# Patient Record
Sex: Female | Born: 1970
Health system: Southern US, Community
[De-identification: ages and names within clinical notes are randomized; demographics above are authoritative.]

## PROBLEM LIST (undated history)

## (undated) DIAGNOSIS — F419 Anxiety disorder, unspecified: Secondary | ICD-10-CM

## (undated) DIAGNOSIS — J309 Allergic rhinitis, unspecified: Secondary | ICD-10-CM

## (undated) DIAGNOSIS — T300 Burn of unspecified body region, unspecified degree: Secondary | ICD-10-CM

## (undated) DIAGNOSIS — Z923 Personal history of irradiation: Secondary | ICD-10-CM

## (undated) DIAGNOSIS — C801 Malignant (primary) neoplasm, unspecified: Secondary | ICD-10-CM

## (undated) DIAGNOSIS — I509 Heart failure, unspecified: Secondary | ICD-10-CM

## (undated) DIAGNOSIS — D649 Anemia, unspecified: Secondary | ICD-10-CM

## (undated) DIAGNOSIS — R519 Headache, unspecified: Secondary | ICD-10-CM

## (undated) DIAGNOSIS — C50919 Malignant neoplasm of unspecified site of unspecified female breast: Secondary | ICD-10-CM

## (undated) DIAGNOSIS — I10 Essential (primary) hypertension: Secondary | ICD-10-CM

## (undated) HISTORY — DX: Burn of unspecified body region, unspecified degree: T30.0

## (undated) HISTORY — DX: Anemia, unspecified: D64.9

## (undated) HISTORY — DX: Headache, unspecified: R51.9

## (undated) HISTORY — DX: Anxiety disorder, unspecified: F41.9

## (undated) HISTORY — DX: Malignant (primary) neoplasm, unspecified: C80.1

## (undated) HISTORY — DX: Malignant neoplasm of unspecified site of unspecified female breast: C50.919

## (undated) HISTORY — DX: Allergic rhinitis, unspecified: J30.9

## (undated) HISTORY — PX: TUBAL LIGATION: SHX77

## (undated) MED ORDER — POTASSIUM CHLORIDE ER 10 MEQ OR TBCR
30.0000 meq | EXTENDED_RELEASE_TABLET | Freq: Every day | ORAL | 1 refills | Status: AC
Start: 2023-02-01 — End: ?

## (undated) NOTE — Progress Notes (Signed)
 Formatting of this note is different from the original.  Reason for consultation:  Scott Cutting, PA-C posed the following question:  What are your expectations of this request?   Lesion noted.  Location: right medial thigh.    First noticed: present for awhile  Changes of symptoms since first noted: abnormal appearing    Clinical question for the dermatologist: Unsure what it is, inflamed area that is scarred? Asymptomatic.Thank you.    Reason for consultation? (E-CONSULT)  Priority: Evaluation of photos (most common)  Routine     Quality of photographs acceptable: Fair    Assessment: R medial thigh - unable to diagnose with photos alone - possible scar but can't assess whether underlying subcutaneous lesion (unable to evaluate subcutaneous lesions with photo alone)    Will place appointment request in dermatology     Would advise patient to follow up with you sooner if rapidly growing, painful, bleeding ,etc    What to do now: I will place an appointment request for the patient to be seen at the nearest dermatology clinic. Please notify the patient.     The request for an appointment has been sent to our department for scheduling. The patient will receive three calls and a letter for scheduling. Since this patient now has an active scheduling request, they can also call [phone] to schedule if they do not want to wait for a call.    Clinicians may check on the status of an active request in the Appointment Desk:  Epic > Scheduling > Appts > Choose patient > Click on ?Active Requests? header at bottom > Double click on Dermatology request      If worsening, significant change in character, please submit new e-consult with updated pictures.    Media Tab    Signed:  Alvah Jewett, MD   Date:  01/17/2024  Time:  12:51 PM    This virtual consult is provided based on my review of the clinical question. I am providing advice only for the area of concern, based on the information provided to me in the clinical  question above and a focused review of the electronic medical record, including photographic images which have inherent limitations compared to in-person examination. The clinical judgment of the clinician physically seeing the patient may supercede recommendations based on a virtual consult or telederm consult. If my recommendations do not seem to support your clinical impression having seen the patient in person, or if the patient does not improve as you would expect, please repeat the e-consult and request a face to face appointment and we would be happy to evaluate the patient in person.      Electronically signed by Alvah Jewett, MD at 01/17/2024  1:24 PM PDT

## (undated) NOTE — Telephone Encounter (Signed)
 Pending Prescriptions: Disp Refills   venlafaxine  (EFFEXOR  XR) 75 mg extended re*180 ca*0   Sig: Take 2 capsules (150 mg) by mouth every morning    -------------------------------------------------------------------    Electronically signed by Bartlett Walterine Other, RPh at 04/08/2024 11:19 AM PDT

## (undated) NOTE — Assessment & Plan Note (Signed)
 Associated Problem(s): Chemotherapy-induced neuropathy  Formatting of this note might be different from the original.  Has symptoms in feet, can lose balance. Not on meds   Electronically signed by Iva Abernethy, MD at 09/24/2024  3:36 PM PST
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
## (undated) NOTE — Telephone Encounter (Signed)
Signed Prescriptions: Disp Refills   venlafaxine (EFFEXOR XR) 75 mg extended re*90 cap*3   Sig: Take 1 capsule (75 mg) by mouth every morning  Authorizing Provider: Daine Floras    -------------------------------------------------------------------    Electronically signed by Daine Floras, MD at 04/27/2023  6:46 PM PDT

## (undated) NOTE — Addendum Note (Signed)
 Addended by: Daine Floras on: 07/29/2023 09:01 AM     Modules accepted: Orders      Electronically signed by Daine Floras, MD at 07/29/2023  9:01 AM PST

## (undated) NOTE — Progress Notes (Signed)
Formatting of this note is different from the original.  Provider needs to reconcile: none    Patient's main concern: Foot Problem (Bilateral foor pain, rigth foot hurts more and pt thinks there may be a facture)     Other needs or issues hoping to address?   Lower back pain    Care reminders were reviewed:  Yes:  pt would like to discuss covid booster with provider    Chaperone for Sensitive Physical Exams (In Office):  Not Applicable    No orders of the defined types were placed in this encounter.    eCheck-In Action/Status       eCheck-in Steps Patient Action Taken Status    Personal Information Completed Completed    Insurance  Filtered    Payments Completed Completed    ESign Documents  Not Offered    Questionnaires Completed Completed    Medications Verified Completed    Allergies Verified Completed    Barcode  Completed         Assigned Appointment Questionnaires and Status       Assigned Questionnaires Status    REASON FOR VISIT SELECTION [facility]) Completed    PAPERLESS EOB OPT IN Completed           Medications reviewed:       Tue Feb 15, 2023  8:25 AM       Allergies reviewed:         Feb 15, 2023  8:24 AM       Questionnaires completed: Review questionnaire responses  Smoking Tobacco Use: Never    Electronically signed by Benna Dunks at 02/15/2023 12:27 PM PDT

## (undated) NOTE — Telephone Encounter (Signed)
 Formatting of this note might be different from the original.  LVM informing pt of below pre-op guidelines prior to procedure today. Given direct line to call for questions.    - please confirm that you are not actively menstruating, spotting is OK.    - please go to lab 30-45 minutes prior to your scheduled check in to drop of urine sample for pregnancy testing. This does not need to be completed if you are menopausal.    - please take 440mg  of naproxen (Aleve) 1 hour prior to your appointment. If you are unable to take Aleve, you can take 1000mg  of extra strength acetaminophen  (Tylenol ) OR 800mg  of ibuprofen  (Advil )    Sydelle Kanaris, RN 8:49 AM    Electronically signed by Kanaris Sydelle Greaser, RN at 08/08/2024  8:49 AM PST

## (undated) NOTE — Progress Notes (Signed)
 Formatting of this note might be different from the original.  Anesthesia E-Consult    Patient Name: Yolanda Bowers  Medical Record Number: [id number]   Date of Birth: 1971-02-19  Today's Date: Jan 05, 2024  Primary Care Provider: Lajean Pike, MD    Referring Provider: Herlene Lone    Reason for Referral: 26yo female referred for her first colonoscopy with known hx of high-grade infiltrating ductal carcinoma of the left breast (ER+/PR+, FISH +) with angiolymphatic invasion, history of neoadjuvant chemotherapy with ddAC-THP, left mastectomy with lymph node biopsy, received 4 or 6 cycles of herceptin  and Perjeta  due to drop in EF, on letrozole , history of PVC ablation on 03/21/2020. According to patient report when she had mastectomy with breast reconstruction surgery, she had hard time waking up from the sedation itself. Warrants further anesthesiology consult to hash out her risks and regimen for anesthesia.     Review summary: I have converted this E-consult to a Virtual Chart for better follow up and to include the Pre-op RNs.    I reviewed the chart but am unable to see any anesthesia records.  EF drop was from 61% to 59% per [facility] records.  Fortunately sedation/anesthesia for colonoscopy should be much less than for her Mastectomy and reconstruction.  I anticipate we can do moderate sedation and avoid general anesthesia for her procedure.  As such I do not see any contraindications to proceeding with her colonoscopy.    RESPONSE TO ANESTHESIA CLINIC/ ANESTHESIA CONSULT  OK TO SCHEDULE FOR SURGERY.    Assessment includes consideration of existing admission criteria for [facility] and, if indicated, appropriate [facility] inpatient facility.  Anticipated anesthetic and perioperative care needs will not exceed the capability of designated facility.    Signature: Eva Hikes, MD  Dept: Anesthesiology  Date: Jan 05, 2024    The above evaluation was performed solely through review of existing electronic medical  records (EPIC) and without in-person examination of the patient in question.  Assessment and recommendations are therefore based only on available information, current medical literature, and risk-reduction strategies to optimize perioperative outcome.  If patient has acute changes in health status, the above recommendation may no longer apply to the clinical scenario and further evaluation may be needed prior to scheduled procedure.    Electronically signed by Eva Hikes, MD at 01/05/2024  2:55 PM PDT
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
## (undated) NOTE — Progress Notes (Signed)
Formatting of this note is different from the original.  Provider needs to reconcile: Allergies     Patient's main concern: Med review    Other needs or issues hoping to address?       Care reminders were reviewed:  No: E checkin    Chaperone for Sensitive Physical Exams (In Office):    No orders of the defined types were placed in this encounter.    eCheck-In Action/Status       eCheck-in Steps Patient Action Taken Status    Personal Information  Not Needed    Insurance  Filtered    Payments Skipped Not Needed    ESign Documents  Not Offered    Questionnaires  Not Needed    Medications Verified Completed    Allergies Updated Completed    Barcode  Completed         Assigned Appointment Questionnaires and Status       Assigned Questionnaires Status    REASON FOR VISIT SELECTION [facility]) Completed           Medications reviewed:       Tue Feb 15, 2023  8:25 AM       Allergies reviewed:         Feb 15, 2023  8:24 AM       Questionnaires completed: No questionnaires completed  Smoking Tobacco Use: Never  Electronically signed by Rosilyn Mings, MA at 04/18/2023 12:28 PM PDT

## (undated) NOTE — Progress Notes (Signed)
 Formatting of this note is different from the original.  Chief Complaint   Patient presents with    Procedure     Colposcopy      New concerns or changes since pre-visit: No    Chaperone for Sensitive Physical Exams (In Office):  Not Applicable        Electronically signed by Ronal Nest, MA at 08/08/2024 11:54 AM PST

## (undated) NOTE — Progress Notes (Signed)
 Formatting of this note is different from the original.  Images from the original note were not included.  Diagnosis and Treatment Plan   (F33.9) Major depressive disorder, recurrent episode with anxious distress  (primary encounter diagnosis)  Comment: PHQ 9 of 10  Plan: 45 MIN INDIVIDUAL PSYCHOTHERAPY     (F43.9) Stress  Comment: divorce, health problems  Plan: 45 MIN INDIVIDUAL PSYCHOTHERAPY     Patient Instructions   -take decisions one step at a time, focus on the next indicated thing! It is not possible for anyone to make so many decisions at once, it is OK to slow it down and break it down!    -continue to practice radical acceptance of the things that are unchangeable     Treatment Goal/Plan     Collaborative therapy goal (what we can work on together): Mange and process stressors, make decisions around life events and relationship, learn coping skills, manage depressive symptoms, manage anxiety level, be honest with myself and see if I want to keep doing this job    What strengths or strategies I can use to support my goal (self-care, skills, resources): Creativity, relationship is supportive    Treatment modality: Cognitive Behavior Therapy (CBT)    What will be different when I achieve my therapy goal: I will know if I am ready for a career change or if I am ready to move to another country, discover possibilities    Last updated by Agustina Caroli, [facility] on 05/26/2023        Treatment Progress: 4 - Partial response: Patient is actively working toward treatment goal(s) with positive trends in data or functioning        Follow up: 3. Based on clinical need and assessment of risk, recommend member return within 3 weeks timeframe.    Interval History     Chief Complaint   Patient presents with    Depression    Stress     Therapeutic Alliance Discussion: Review Therapeutic Alliance responses and discuss feedback with patient.      Suicidal Ideation: Denies suicidal ideation.    Session Summary:     Yolanda Bowers  shares that the information and discussion around radical acceptance was really helpful. Yolanda Bowers and MLT review the difference between acceptance and approval, and how we only use the acceptance with the things that are not changeable.    Yolanda Bowers shares the acceptance has led her to think she may sell the house. Yolanda Bowers shares she has so many decisions she is weighing, she does not feel direct pressure form partner but it is in the back of her mind as his visa limits how often he can visit. MLT and Lavone discuss taking off some of this burden and remembering the visa travel limits are not Yolanda Bowers's fault, and that it is OK to take decisions slowly and put son first.    Yolanda Bowers and MLT discussed taking things one step at a time and focusing on the next indicated thing. Much like getting treatment for an illness, we only focus on the next indicated steps instead of 25 steps ahead, and the same goes for all of the decisions Antonetta has to make, MLT reminds Yolanda Bowers to focus on one at a time and go from there.     The treatment modality of Cognitive Behavior Therapy (CBT) Informed the treatment strategy: Psychoeducation, Behavior Modification, Problem-Solving Therapy, Cognitive Coping, Validation, and Supportive listening       No data to display  10/26/2023     2:47 PM 07/31/2023     9:54 PM   BHI Monitor Detail   Total Score (MyGH) 16 8   Total Score 16  8    PHQ-2 Score 4  1    Anhedonia 3  1   Sadness 1  0   Sleep 3  2   Energy 3  2   Appetite 3  1   Failure 0  0   Concentration 3  1   Slowed or Restless 0  1   SUICIDE 0  0   PHQ9 Total 16  8    Anhedonia (MyGH) Nearly every day Several days   Sadness [facility]) Several days Not at all   Sleep [facility]) Nearly every day More than half the days   Energy [facility]) Nearly every day More than half the days   Appetite [facility]) Nearly every day Several days   Failure [facility]) Not at all Not at all   Concentration [facility] Nearly every day Several days   Slowed or Restless [facility]) Not at all Several days    SUICIDE [facility]) Not at all Not at all   GAD-2 Feeling Nervous  1   GAD-2 Unable to stop worrying  0   GAD-2 Total  1    Functioning  1   Frequency of drinking 1  2   # of drinks per day 0  0   6 or more drinks on one occasion 0  0   Audit-C Total 1  2    Marijuana use 0  1   Illegal drug use 0  0   Access to guns? 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

## (undated) NOTE — Progress Notes (Signed)
Formatting of this note is different from the original.  Office Visit    Subjective:   Patient ID: Yolanda Bowers is a 21 year old female     Reason for Visit Questionnaire   Primary Reason for Visit: Other   Symptom Description Dr requested an appt to review my medications.    Prior History of This Concern Doesn't Apply   Symptom Duration     Symptom Frequency     Symptom Getting Better or Worse     Treatments Patient Has Tried     Possible Symptom Cause     Symptom Worries     Other Medical Concerns Yes   Other Medical Concerns- Details I am considering stopping venlafaxine and I want advice regarding how to do this and if should.;So I need a new referral for massage therapy?     Review questionnaire responses  Medications, Family and Social History per Epic  Patient provided verbal consent to be recorded using documentation assistance tool.   BP 122/71   Pulse 56   Wt 170 lb (77.1 kg)   SpO2 100%   BMI 27.44 kg/m     Physical Exam  Constitutional:       Appearance: Normal appearance.   HENT:      Head: Normocephalic and atraumatic.   Cardiovascular:      Rate and Rhythm: Normal rate and regular rhythm.   Pulmonary:      Effort: Pulmonary effort is normal.      Breath sounds: Normal breath sounds.   Neurological:      Mental Status: Yolanda Bowers is alert.     No axillary lymphadenopathy or edema on left side axilla.   History of Present Illness  Yolanda Bowers is a 47 year old female presents for a medication review. She has been on Effexor (venlafaxine) 75mg , Valsartan, Metoprolol, Femira, Aspirin, and Zyrtec. She also takes Metocarbamol as needed for muscle spasms.    Yolanda Bowers has been contemplating the effects of venlafaxine, expressing feelings of  feeling numb , which she has noticed over the past year. Despite these concerns, she acknowledges the medication's effectiveness in managing her anxiety and reducing the frequency of hot flashes. She is going through divorce and it has been stressful.    In addition to her current  medication regimen, Yolanda Bowers is dealing with multiple health issues. She has a history of breast cancer and has recently been diagnosed with a brain cyst, which was discovered during an MRI scan conducted due to symptoms of nausea, dizziness, and falls. Has an appointment with neurology this week.     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 This was discovered during a series of genetic tests conducted by her cardiologist to determine the cause of her heart failure.    Lastly, Yolanda Bowers reports swelling in the axilla and underarm on left side , which was noticed during a recent massage session. An ultrasound has been ordered to further investigate this issue.    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 She is currently going through a divorce and is unsure if this is the right time to change her medication. Will continue with venlafaxine.     Hypertension  Well controlled with valsartan and metoprolol.  -Continue valsartan and metoprolol as prescribed.    Continue cetrizine for allergies     pineal cyst:    Recent MRI showed a pineal gland cyst is stable from 9/23. However no mention of pineal cyst on MRI 9/23.  Has an appointment to follow up with a neurologist.  -Continue current medications and follow up with neurologist.       CPT2 deficiency  Assessment & Plan:  Asymptomatic. Is a carrier. Plans to have her kids tested sometime .     Major depressive disorder, recurrent episode with anxious distress  Comments:  in remission on effexor    General Health Maintenance  -Plan for mammogram in December.  -Plan for COVID vaccine after September.      Electronically signed by Daine Floras, MD at 04/18/2023 12:28 PM PDT
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
## (undated) NOTE — Telephone Encounter (Signed)
 Signed Prescriptions: Disp Refills   venlafaxine  (EFFEXOR  XR) 75 mg extended re*180 ca*0   Sig: Take 2 capsules (150 mg) by mouth every morning  Authorizing Provider: IVA ABERNETHY    -------------------------------------------------------------------    Electronically signed by IVA ABERNETHY, MD at 04/09/2024  9:09 PM PDT

## (undated) NOTE — Progress Notes (Signed)
 Formatting of this note is different from the original.  Caralyn Twining Treanor is a 20 year old female who presents for evaluation of abnormal Pap smear(s).  Consultation requested by Vankireddy, MD .       Recent pap smear showed: Negative (NILM, negative for intraepithelial lesion or malignancy), HPV positive non 16/18 , and HPV positive 16   The prior pap showed: Negative (NILM, negative for intraepithelial lesion or malignancy)  Prior cervical/vaginal/vulvar disease: CIN 1  Prior cervical treatment: no treatment    HPV vaccine: Unvaccinated. Counseling provided for vaccine. Patient not eligible for vaccine..     Patient's last menstrual period was 12/22/2018 (approximate).  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    Carcinoma of left breast, estrogen and progesterone receptor positive    Chemotherapy induced cardiomyopathy    Macromastia    Malignant neoplasm of upper-outer quadrant of left breast in female, estrogen receptor positive    PVC's (premature ventricular contractions)    Major depressive disorder, recurrent episode with anxious distress    Stress    Divorce proceedings pending    CPT2 deficiency    Colon cancer screening    Chemotherapy-induced neuropathy    Cervical high risk HPV (human papillomavirus) test positive     Allergies as of 08/08/2024 - Reviewed 06/27/2024   Allergen Reaction Noted    Codeine Nausea and Vomit 05/10/2007    Lisinopril  Cough 05/14/2020    Paclitaxel  Hives 05/04/2019    Walnut Rash 11/25/2019     Patient Active Problem List     Tobacco Use   Smoking Status Never   Smokeless Tobacco Never     Discussed abnormal pap smears and the need for colposcopy and possible biopsy to diagnose.  Procedure for colposcopy and possible biopsy has been explained to  the   patient and consent obtained.    PROCEDURE:  Speculum placed in vagina with excellent visualization of cervix.  5% acetic acid applied to cervix and vagina.    FINDINGS:    Pelvic: Bimanual and vaginal exams reveal: external genitalia normal, vaginal mucosa normal, cervix clean, cervix and vagina atrophy noted    Lymphatic: Inguinal and femoral adenopathy:Not examined    Cervix: Inadequate exam (unable to visualize entire SCJ)  no visible lesions  no mosaicism  no punctation  no abnormal vasculature    Procedure: cervix swabbed with Lugol's solution, endocervical curettage performed; 4 quadrant biopsies done for HPV 16 positive    Vaginal inspection: vaginal colposcopy not performed    Vulvar colposcopy: vulvar colposcopy not performed    Procedure Summary: patient tolerated procedure well, colposcopy inadequate    ASSESSMENT: Normal exam without visible pathology    PLAN:Specimens labelled and sent to Pathology and Will base further treatment on Pathology findings    Nathanel JINNY Ned, MD      Electronically signed by Ned Nathanel JINNY, MD at 08/08/2024 11:54 AM PST

## (undated) NOTE — Discharge Summary (Signed)
 Formatting of this note is different from the original.  PERIOPERATIVE DISCHARGE SUMMARY    Admitted: 06/13/2024  8:07 AM  Reason: Periprocedural  Preop Dx: Pre-Op Diagnosis Codes:      * Colon cancer screening [Z12.11]   Procedure: COLONOSCOPY  Case findings and description per operative report.     Discharged: 06/13/24  DC Dx: Post-Op Diagnosis Codes:     * Colon cancer screening [Z12.11]  Course: patient tolerated the procedure well.  Consults: None  Dispo: Home  Condition: Good  Orders: Per perioperative orderset and PACU criteria.   Instructions: Educational and perioprocedural instructions per AVS.  Future Appointments   Date Time Provider Department Center   06/20/2024  4:15 PM Post, Powell Ee, [facility] [place]   06/27/2024  3:40 PM Iva Abernethy, MD [facility] [place]       Medication List       CONTINUE taking these medications      acetaminophen  500 mg tablet  Commonly known as: TYLENOL     aspirin 81 mg delayed release tablet    cholecalciferol (vit D3) 50 mcg (2,000 unit) Cap    letrozole  2.5 mg tablet  Commonly known as: FEMARA   Take 1 tablet (2.5 mg) by mouth daily    metoprolol  succinate 25 mg extended release (24 hr) tablet  Commonly known as: TOPROL  XL  Take 1 tablet (25 mg) by mouth 2 times daily    polyethylene glycol 17 gram/dose oral powder  Commonly known as: MIRALAX   Dissolve 17 g into specified amount of water & drink by mouth daily    valsartan  40 mg tablet  Commonly known as: DIOVAN   Take 1 tablet (40 mg) by mouth 2 times daily    venlafaxine  75 mg extended release (24 hr) capsule  Commonly known as: EFFEXOR  XR  Take 2 capsules (150 mg) by mouth every morning    ZYRTEC  ORAL          STOP taking these medications      lisinopriL  2.5 mg tablet  Commonly known as: ZESTRIL           ASK your doctor about these medications      cyanocobalamin 500 mcg tablet  Commonly known as: VITAMIN B12          Provider: Gattis Pen, MD  Date: June 13, 2024  Time: 10:01  AM    Electronically signed by Pen Gattis, MD at 06/13/2024 10:02 AM PDT

## (undated) NOTE — Progress Notes (Signed)
Formatting of this note is different from the original.  Provider needs to reconcile: None    Patient's main concern: Well Exam and Medication review     Other needs or issues hoping to address?   Brain cyst     Care reminders were reviewed:  Yes: Patient Instructions provided    Chaperone for Sensitive Physical Exams (In Office):  Not Applicable    No orders of the defined types were placed in this encounter.    eCheck-In Action/Status       eCheck-in Steps Patient Action Taken Status    Personal Information  Not Needed    Insurance  Filtered    Payments Skipped Not Needed    ESign Documents  Not Offered    Questionnaires Completed Completed    Medications Verified Completed    Allergies Updated Completed    Barcode  Completed         Assigned Appointment Questionnaires and Status       Assigned Questionnaires Status    REASON FOR VISIT SELECTION (MYCHART) Completed    BHI MONITORING TOOL (MYCHART) Completed    WELL-CARE QUESTIONNAIRE 18-65+ Completed    PAPERLESS EOB OPT IN Completed           Medications reviewed:       Mon Apr 18, 2023 10:51 AM       Allergies reviewed:         Apr 18, 2023 10:51 AM       Questionnaires completed: Review questionnaire responses  Smoking Tobacco Use: Never  Electronically signed by Epifania Gore, MA at 04/18/2023 12:28 PM PDT
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
## (undated) NOTE — Progress Notes (Signed)
 Formatting of this note is different from the original.  Images from the original note were not included.  PHYSICAL THERAPY LUMBAR EVALUATION    Reason for Referral: (M54.50) Low back pain without sciatica, unspecified back pain laterality, unspecified chronicity  (primary encounter diagnosis)      Date of Onset:  summer 2025  Referring Practitioner: Iva Abernethy, MD   Recommended Treatment: Eval and Treat   Precautions/Contraindications: no  Occupation: [employer]     Visit: 1    Reason for Visit Questionnaire   Symptom Description My lower back has become more sore since the summer and becomes stiff and a hy when i sit for more than 20 min, walk for more than 30 min and work on [employer] on the wheel.   Symptom Duration For 2 - 6 months   Symptom Frequency Always   Symptom Getting Better or Worse Worsening   Symptom Triggers Exercise classes make it worse and doong yardwork and pottery work is more difficult.   Symptom Worries I have osteopenia in my spine and my last spine scan showed it has become worse. 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   Treatments Patient Has Tried Stretching helps a little, warm baths with epsom salts so etimes helps the pain, i take frequent breaks when working on the wheel and that helps some.  Sleeping on my side helps some.   External Medical Records No    Recent UC, ER or admission No    Forms/Paperwork No          SUBJECTIVE     Patient Reports 06/07/2024:  Back has been sore for a long time. Got worse over the summer. Pain in the mid section to low back. Wake up sore, walk and I get sore, sitting I get sore. Haven't been able to exercise because it hurts my back. Have osteopenia. Do pottery. 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 Even if I  stretch it hurts after an hour. Would love to be able to walk more than 30  min. Back gets sore. Have put on some weight this year. Would love to be able to do weight training classes. Can't take ibuprofen . Use dragon balm sometimes. If I sit for awhile, I am stiff getting up. Affects my hips too. Pain does wake me up. I find myself slouching a lot. Slouching hurts though. Can't pick up heavy objects. Can't do more than 25 lbs. Wedging clay is hard. 20-30 min for anything is a lot. I pop a lot, clunk.     Functional Limitations/Worse With: prolonged position or activity     OBJECTIVE   Imaging: x-ray lumbar  Impression:   Trace right convex lumbar curvature.  Mild left L3-L4 disc height   loss.  No change compared to 2024.  No fracture.     Signed by: Harlene Prude   Date: 04/20/2024 3:26 PM     Standardized Care Tool Used      06/06/2024     5:28 PM   PT/OT Patient Specific Functional Scale   Activity 1: Sleeping comfortably without pain   Activity 1 Score: 5   Activity 2: Working on the [employer] wheel   Activity 2 Score: 5   Activity 3: Yoga   Activity 3 Score: 5   Activity 4:  Sitting for more than 20 minutes   Activity 4 Score: 5   Activity 5: Walking for more than 30 minutes   Activity 5 Score: 5   Mean: 5       Patient-reported      06/07/2024   Gait Patient shows no significant  gait deviations   Posture Iliac crests level  Lumbar lordosis slightly increased       Lumbar range of motion Standing:   06/07/2024   Flexion Fingertips to toes, P    Extension 15  P   Side-bend Right Fingertips to knee   Side-bend Left Fingertips to knee   Rotation Right Within normal limits    Rotation Left  P in middle back      Manual Muscle Test   06/07/2024    Hip Flexion Right: 4-/5  Left: 4-/5    Hip Extension Right: 4P/5  Left: 4P/5    Hip Abduction Right: 4/5 P  Left:     Hip ER Right: 4+/5  Left: 4+/5    Knee Extension Right: 5/5  Left: 5/5      Special Tests   06/07/2024   Scour Right: -  Left: -   FABER Right: +  Left: -   Lumbar Joint Mobility L3&L5, left side L3     P = painful  TREATMENT    TREATMENT:   Therapeutic Exercises: below, bolded = completed 1 set of strengthening or 1 rep of stretching in the clinic, unless stated otherwise    Patient handout provided: Yes  Access Code: [email or web address]  URL: [email or web address]  Date: 06/07/2024  Prepared by: Deitra Dada    Exercises  - Hooklying Isometric Hip Flexion  - 3-5 x weekly - 1-2 sets - 10 reps - 5-10 hold  - Supine Transversus Abdominis Bracing - Hands on Stomach  - 3-5 x weekly - 1-2 sets - 10 reps - 5 hold  - Supine Bridge  - 3-5 x weekly - 1-3 sets - 10 reps  - Clamshell  - 3-5 x weekly - 1-3 sets - 10 reps    Treatment Time:   Physical Therapy Evaluation 20 minutes  Therapeutic Exercise 20 minutes  Total Time 40 minutes    ICD10: (M54.50) Low back pain without sciatica, unspecified back pain laterality, unspecified chronicity  (primary encounter diagnosis)       Initial Visit with PT: 06/07/2024    ASSESSMENT   Marifer L Savannah is a 2 year old who presents with low back pain. Patient demonstrates good mobility however decreased core and hip strength affecting activities of daily living, sleep, work, recreational activities.    The patient will benefit from skilled physical therapy to address the above stated problems.     Evaluation complexity is LOW based on the following factors:    Clinical presentation is stable  Body System (body structures/functions, activity limitations and/or participation restrictions;  addressing 1-2 elements   Number of personal factors addressed:  no personal factors and/or comorbidities.    Barriers to Recovery:  None    GOALS     Short Term Goals  Patient will increase sitting tolerance to 40 minutes or greater to perform seated work requirements.  Patient will resume prior level of sleeping pattern undisturbed by back pain.  Time Frame: 07/19/2024    Long Term Goals  Patient will increase tolerance to sitting > 1 hour to perform seated work requirements.  Patient able to continuously walk 60 minutes or  greater without limitations from  back pain to be able to participate in community activities.  Patient able to stand for > 45 minutes to perform work duties/requirements.  Patient will have 2 point or greater increase in total PSFS score demonstrating decreased impact of their condition on their daily life.  Patient will be independent with home exercise program.   Time Frame: 09/07/2024    Prognosis to achieve goals: Good    PLAN   Next visit treatment recommendations:  progress core and hip strengthening as tolerated    Treatment Plan with Rationale:  The patient is to check back every 1-3 weeks, for 12 week(s) starting 06/07/2024 to progress toward short and long term goals.   Range of Motion Exercises  to increase functional mobility  Safe Stretching of the Joint  to increase functional mobility  Strengthening as tolerated to increase functional mobility  Joint Mobilizations  to decrease pain and/or increase ROM   Soft Tissue Mobilization as Therapeutically Necessary to decrease pain and/or increase mobility  Closed Chain Strengthening  as tolerated to increase functional mobility    Discharge of services when long term goals are met, patient plateaus in progress or doesn't return for follow up visit in greater than 4-6 weeks.      Deitra Earnie Dada, PT     Electronically signed by Dada Deitra Earnie, PT at 06/07/2024  4:58 PM PDT

## (undated) NOTE — Telephone Encounter (Signed)
Formatting of this note might be different from the original.  Images from the original note were not included.  Insurance Confirmed: PPO    Spoke With: Patient    Patient asking for: Social Work     Scheduled with: SW - Patient states they are Physically SAFE and no HI in last two weeks - 40 min FOLLOW UP: 1220 - Video Visit    Additional Info:  N/A    MH Process Needed? No    Additional Call Information       Electronically signed by Vern Claude, Asst at 04/12/2023  9:11 AM PDT
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
## (undated) NOTE — Progress Notes (Signed)
 Formatting of this note is different from the original.  Phone Visit    Subjective:   Patient ID: Yolanda Bowers is a 51 year old female           Medications, Family and Social History per Epic    Patient provided verbal consent to be recorded using documentation assistance tool.     Drenching sweats every night from october. Having to use towel in the middle of the night.tried limting caffeine and drink more water. Sometimes she does not even wear clothes and open the window and still has  sweats. She had it with chemotherapy.   On femara from 2021. Chemo for breast cancer was May 2020 and completed it in October. Having venlafaxine from  2021  Last menses was may 2020   [place] cellulitis 10/18 and got an ultrasound 10/23 with no LYMPHADENOPATHY   Has a lump on forearm (cannot see but she can feel it like a size of a quarter)and it has not resolved., but no redness or rash or itching like how she had when she went to ER   No unexplained fever or unexplained weight loss.   Has cough since chemo  Feet hurt  Oncologist in the past recommended gabapentin. But she decided to not to take it. So they tried venlafaxine and that helped some hot flashes. Has some hot flashes during the day but night sweats are new. She had occasional hot flashes at night but nothing like drenching sweats.     Assessment and Plan:       1. Night sweats  -     QUANTIFERON - TB GOLD (QUAL ONLY)  -     URINALYSIS WITH REFLEX TO MICROSCOPIC AND CULTURE ([facility])  -     COMP. METABOLIC PANEL  -     CRP (C-REACTIVE PROTEIN FOR INFLAMMATION) (G  -     HEPATITIS C SCREENING (REFLEX) ([facility])  -     HIV SCREENING TEST W/REFLEX  -     TSH (REFLEXIVE)  -     CBC/PLT/DIFF W/REFLEX TO MANUAL DIFF IF INDI  -     X-RAY CHEST 2V (STD) PA+LAT  -     CULTURE, BLOOD-ROUTINE [facility])    Likely her symptoms are related to letrozole.  However because of new onset of drenching night sweats further evaluation is necessary to rule out other causes.  Encouraged  her to do mammogram, mail the fit kit back, and fill the behavioral health questionnaire.  Follow up in 3 weeks if symptoms persist. If above workup is abnormal/normal  I recommend doing CT chest abdomen and pelvis..        This phone visit consisted of 19 minutes of interaction with the patient.  Provider was located at [facility] and the patient was located at [facility] in [facility] for this visit.   Electronically signed by Daine Floras, MD at 07/29/2023  9:04 AM PST

## (undated) NOTE — Progress Notes (Signed)
Formatting of this note is different from the original.   Reason for visit: follow up    All medications verified and any discrepancies noted.     Outside medications patient has no outside meds.      Patient's pharmacy benefit was verified and preferred pharmacy was selected.      Patient has been informed of the following care gaps: Yes    - A pap test to check for cervical cancer.     - A flu shot to protect against the flu virus.        Electronically signed by Audrea Muscat, LPN at 91/47/8295  2:53 PM PST

## (undated) NOTE — H&P (Signed)
 Formatting of this note is different from the original.  History and Physical Exam:     Indications: Yolanda Bowers is 58 year old and presents for  Procedure(s):  COLONOSCOPY for Pre-Op Diagnosis Codes:      * Colon cancer screening [Z12.11].    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    Carcinoma of left breast, estrogen and progesterone receptor positive    Chemotherapy induced cardiomyopathy    Macromastia    Malignant neoplasm of upper-outer quadrant of left breast in female, estrogen receptor positive    PVC's (premature ventricular contractions)    Major depressive disorder, recurrent episode with anxious distress    Stress    Divorce proceedings pending    CPT2 deficiency    Colon cancer screening    Chemotherapy-induced neuropathy     Current Outpatient Medications   Medication Instructions    acetaminophen  (TYLENOL ) 500-1,000 mg,  AS NEEDED    aspirin 81 mg, Daily    cetirizine  HCl (ZYRTEC  ORAL) Take by mouth    cholecalciferol (vit D3) 5,000 units, Daily    cyanocobalamin (VITAMIN B12) 500 mcg, Daily    letrozole  (FEMARA ) 2.5 mg, Oral, Daily    metoprolol  succinate (TOPROL  XL) 25 mg, Oral, 2 Times Daily    polyethylene glycol (MIRALAX ) 17 g, Oral, Daily    valsartan  (DIOVAN ) 40 mg, Oral, 2 Times Daily    venlafaxine  (EFFEXOR  XR) 150 mg, Oral, Every Morning     Allergies   Allergen Reactions    Codeine Nausea and Vomit    Lisinopril  Cough    Paclitaxel  Hives    Walnut Rash       Physical Exam:  General Appearance: Healthy appearing and alert  Lungs: Clear to auscultation  Cardiovascular: Regular rate and rhythm  Abdomen: soft, non-tender    Assessment:  Okay to proceed with planned Procedure(s):  COLONOSCOPY.    Medically stable     Immediate Pre-Procedure Reassessment Prior  to Sedation and Procedure:    Any difficulty with prior anesthesia or sedation No    Risk of airway intubation:  1. The Mallampati score is grade 1: Uvula completely visible  2. The occipital spine is below mentum with neck fully extended  3.  Mouth Opening: The inter-incisor distance is greater than 35 mm  4. The patient has a history of no increased risk of difficult airway intubation    ASA Level: II - Mild or mod systemic disease w/o functional impairment or > 39 years old    Informed Consent: The indications, risks, benefits and alternatives to the procedure were discussed in detail. Risks of the procedure were described as including, but not limited to, bleeding, perforation, heart and lung complications, infection, medication reaction and missed diagnosis. Signed consent has been obtained.    Plan for sedation: Ok to proceed with conscious sedation.    Pertinent laboratory studies in Epic have been reviewed. Patient is ready for sedation and procedure.    Gattis Pen, MD   06/13/2024 9:13 AM      Electronically signed by Pen Gattis, MD at 06/13/2024  9:13 AM PDT

## (undated) NOTE — Progress Notes (Signed)
 Formatting of this note might be different from the original.  Attempted to contact the patient via phone to complete pre-visit prep. SM sent with reminder to complete E-CHECK IN.    Electronically signed by Marshell Levan, [place] at 07/29/2023  8:58 AM PST

## (undated) NOTE — ED Notes (Signed)
Formatting of this note might be different from the original.  2 week hx redness, tenderness,  swelling left arm, worse last 2 days. No known injury. No fevers/chills. Positive distal CMS  Electronically signed by Vic Ripper, RN at 06/10/2023  4:57 PM PDT

## (undated) NOTE — Progress Notes (Signed)
 Formatting of this note is different from the original.  Images from the original note were not included.  Diagnosis and Treatment Plan     Major depressive disorder, recurrent episode with anxious distress   [[id number]]  -  Primary  Stress   [201941]          Patient Instructions:     What is Feedback Informed Care?    Feedback Informed Care is a measurement-based collaborative process that engages you, the patient, in the decisions that impact your care. Feedback Informed Care is an evidence-based practice that focuses on:    Collaboratively setting treatment goals  Symptom monitoring over time  Measuring goodness of fit between you and your clinician    Why Feedback Informed Care?    A number of studies have demonstrated that when your clinician monitors outcomes and feedback, you get better faster. Many Systems analyst, [employer], and the American Psychiatric Association recommend Feedback Informed Care.    Feedback Informed Care has shown to:  As much as DOUBLE the effectiveness of treatment   Decrease treatment dropout rates by half   Reduce hospitalizations and shorten lengths of stay by two thirds   Provides structure and focus for treatment  Puts you at the center of your care - establishing goals, assessing progress, and providing feedback about the goodness of fit     Core Elements of Feedback Informed Care:    Before Your Visit  Complete the pre-visit questionnaire before every visit with your clinician.    During Your Visit  With your clinician, review your questionnaire data (symptoms and goodness of fit), your homework completed between visits, and your progress toward your treatment goal.    After Your Visit  Take action on the steps to reach your treatment goal that you and your clinician agreed on during your session.     If you have any concerns or questions regarding your care, please send me a secure message via your [facility] "MyChart"  account. I look forward to seeing you again next session.      Treatment Goal/Plan     Collaborative therapy goal (what we can work on together): Mange and process stressors, make decisions around life events and relationship, learn coping skills, manage depressive symptoms, manage anxiety level, be honest with myself and see if I want to keep doing this job    What strengths or strategies I can use to support my goal (self-care, skills, resources): Creativity, relationship is supportive    Treatment modality: Cognitive Behavior Therapy (CBT)    What will be different when I achieve my therapy goal: I will know if I am ready for a career change or if I am ready to move to another country, discover possibilities    Last updated by Agustina Caroli, [facility] on 05/26/2023        Treatment Progress: I - Initial visit with this provider. Treatment progress to be assessed next visit        Follow up: Vienne to schedule follow-up in 3 week(s).    Interval History     Chief Complaint   Patient presents with    Depression    Stress     Therapeutic Alliance Discussion: Initial visit. Provide overview of Therapeutic Alliance.     Suicidal Ideation: Denies suicidal ideation.    Session Summary:     Patient and MLT completed treatment plan with goals around managing Stressors around filing for divorce,  ex husband making the divorce contentious and how patient will have to provide ex with half of all she has earned and worked so hard for over the years.     Yolanda Bowers shares she is trying to make some big life decisions and would like someone to problem solve with.    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     The treatment modality of Cognitive Behavior Therapy (CBT) Informed the treatment strategy:  Treatment Planning, Problem-Solving Therapy, Validation, and Supportive listening       No data to display             05/26/2023     5:07 PM 04/18/2023     9:51 AM   BHI Monitor Detail   Total Score [facility])  2   Total Score 11 2    PHQ-2 Score 3 0   Anhedonia 2 0   Sadness 1 0   Sleep 1 1   Energy 3 0   Appetite 0 0   Failure 0 0   Concentration 3 1   Slowed or Restless 1 0   SUICIDE 0 0   PHQ9 Total 11 2   Anhedonia ([facility])  Not at all   Sadness [facility])  Not at all   Sleep [facility])  Several days   Energy [facility])  Not at all   Appetite [facility])  Not at all   Failure [facility])  Not at all   Concentration [facility])  Several days   Slowed or Restless [facility])  Not at all   SUICIDE [facility])  Not at all   GAD-2 Feeling Nervous 1 1   GAD-2 Unable to stop worrying 0 0   GAD-2 Total 1 1   Functioning 1 0   Frequency of drinking 2 2   # of drinks per day 0 0   6 or more drinks on one occasion 0 0   Audit-C Total 2 2   Marijuana use  1   Illegal drug use 1 1   Access to guns? 0 0     MHP[employer] [place]    Mental Status Features   Only Note Clinically Significant changes to Mental Status Features: [facility] Notable Mental Status Features: No Clinically significant changes since last noted    Substance Use Conversation: N/A    Visit Type   Patient was given disclosure statement: N/A    Session attended by: Patient     Duration of Visit: 45 minutes    This visit was completed via Video Visit: Location of patient: Home in [place] Location of provider: [facility] clinic      Electronically signed by Agustina Caroli, [facility] at 05/27/2023 12:55 PM PDT

## (undated) NOTE — Telephone Encounter (Signed)
Formatting of this note might be different from the original.     Refill(s) are unable to be approved by the Centralized Refill Management Program as the  requested medication(s) requires provider review and authorization.  Please review request and take appropriate action.  If medication is denied or additional follow up is needed, please work with your care team to notify patient.    Luella Cook  Centralized Refill Management.      Electronically signed by Ethlyn Gallery, RPh at 04/27/2023  9:26 AM PDT

## (undated) NOTE — Progress Notes (Signed)
 Formatting of this note is different from the original.  TELEPHONE CONSULTATION  [facility]  Gastroenterology    12/29/2023    [place]  [address]  [place] [id number]    Problem list:  [phone]) Colon cancer screening  (primary encounter diagnosis)    Chief complaint: Pre-colon    History of Present Illness:   Yolanda Bowers is a 17 year old female with a past medical history of cervical Ca, chemo induced cardiomyopathy, PVC's, CPT2 deficiency, MDD, who is scheduled for a phone consultation with the GI department for the above gastrointestinal issues and complaints.    #Pre-colon:  - PV scheduled today to discuss arranging a screening colonoscopy. Never had any Colonoscopy before.  - Patient feeling well today. Has no GI complaints.  - Denies abdominal pain, nausea, vomiting, diarrhea, constipation, bloody stool, melena, unintentional weight loss, loss of appetite.  - Occasional urgency past 2 months.   - Denies CP and SOB.   - History of high-grade infiltrating ductal carcinoma of the left breast (ER+/PR+, FISH +) with angiolymphatic invasion, history of neoadjuvant chemotherapy with ddAC-THP, left mastectomy with lymph node biopsy, received 4 or 6 cycles of herceptin  and Perjeta  due to drop in EF on letrozole , VT, history of PVC ablation on 03/21/2020.  - She mentions that she had hard time waking or prolonged recovery time after general anesthesia in the past. Will arrange further anesthesiology referral for them to assess the patient.       #Procedure Needs:  - Complications with prior procedure: Yes: prior deep sedation with hard time waking up.  - Family history of colon cancer: No  - History of diabetes: No  - Blood Thinners: No  - Alcohol use: No  - Marijuana use: No  - Pacemaker/Defibrillator: No  - BMI >45 No  - Hx of respiratory conditions/sleep apnea? No  - Hx of cardiac problems? Yes: PVC's, cardiomyopathy induced by chemo tx.  - Taking ASA >325mg  and NSAIDS  regularly? No  - Hospitalization less then 30 days ago? 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    Carcinoma of left breast, estrogen and progesterone receptor positive    Chemotherapy induced cardiomyopathy    Macromastia    Malignant neoplasm of upper-outer quadrant of left breast in female, estrogen receptor positive    PVC's (premature ventricular contractions)    Major depressive disorder, recurrent episode with anxious distress    Stress    Divorce proceedings pending    CPT2 deficiency     Past Surgical History:   Procedure Laterality Date    BREAST RECONS W/ OTHER TECHNIQUE  Reduction in right and mastecomy of left    CARDIAC SURGERY  July 2021 procedure to fix issues done at [facility] [address] NODE BIOPSY  April 2020 and 5 lymphnodes  removed Jun 25 2019    TONSILLECTOMY PRIM/SECONDARY < AGE 71       - Patient reported 05/25/2009     Family History:     Family History   Problem Relation Name Age of Onset  Breast CA (mother,sister,aunt) Maternal Grandmother Matgareet 22    Vision loss Maternal Grandmother Matgareet     Birth defects Maternal Grandmother Matgareet     Stroke Other          grandmother    Ovarian Cancer Paternal Aunt  48    Cancer - Ovarian Paternal Aunt Merlynn     Diabetes Mother Jan     Drug abuse Mother Jan     Hearing loss Mother Jan     High blood pressure Mother Jan     Mental Illness - other Mother Jan     Vision loss Mother Jan     Depression Mother Jan     Diabetes Sister Steph     Stroke Paternal Grandmother Velma     Cancer, Other Maternal Grandfather Alex     Alcohol abuse Maternal Grandfather Alex     Arthritis - Rheumatoid Paternal Aunt Ruby      Social  History:     Social History     Tobacco Use    Smoking status: Never    Smokeless tobacco: Never   Substance Use Topics    Alcohol use: Yes     Types: 1 Glasses of wine per week     Comment: Not every week    Drug use: No     Medications:     Current Prescriptions:  LETROZOLE  2.5 MG TABLET, Take 1 tablet (2.5 mg) by mouth daily  VALSARTAN  40 MG TABLET, Take 1 tablet (40 mg) by mouth 2 times daily  METOPROLOL  SUCCINATE ER 25 MG TABLET,EXTENDED RELEASE 24 HR, Take 1 tablet (25 mg) by mouth 2 times daily  VENLAFAXINE  ER 75 MG CAPSULE,EXTENDED RELEASE 24 HR, Take 1 capsule (75 mg) by mouth every morning  METHOCARBAMOL  500 MG TABLET, Take 1 tablet (500 mg) by mouth at bedtime as needed for musle spasms  ASPIRIN 81 MG TABLET,DELAYED RELEASE, Take 81 mg by mouth daily  ZYRTEC  ORAL, Take by mouth    Allergies:     Allergies as of 01/02/2024 - Reviewed 06/10/2023   Allergen Reaction Noted    Codeine Nausea 05/10/2007    Lisinopril  Cough 05/14/2020    Walnut Rash 11/25/2019    Paclitaxel  Hives 05/04/2019     Labs     HEMOGLOBIN   Date Value Ref Range Status   10/31/2023 11.7 11.4 - 15.5 GM/DL Final   98/97/7974 87.3 11.4 - 15.5 GM/DL Final     HEMATOCRIT   Date Value Ref Range Status   10/31/2023 36 34 - 46 % Final   08/25/2023 37 34 - 46 % Final     PLATELET COUNT   Date Value Ref Range Status   10/31/2023 197 140 - 450 10*3 Final   08/25/2023 201 140 - 450 10*3 Final     FERRITIN   Date Value Ref Range Status   06/02/2016 25 13 - 150 ng/mL Final     IRON   Date Value Ref Range Status   06/02/2016 148 52 - 150 ug/dL Final   98/69/7986 895 52 - 150 ug/dL Final     TIBC   Date Value Ref Range Status   06/02/2016 353 240 - 400 ug/dL Final   98/69/7986 673 240 - 400 ug/dL Final     % SATURATION   Date Value Ref Range Status   06/02/2016 42 15 - 50 % Final   09/22/2011 32 15 - 50 % Final     Diagnostic Studies:  Procedures:  Never had any EGD or Colonoscopy before.    TTE - 09/07/2022  Conclusion   Normal left ventricular size  and normal function, EF 59%, GLS -17.7%, no   regional wall motion abnormalities.     normal right ventricular size and function     Trace aortic and trice mitral regurgitation     Atrial septal aneurysm with probable PFO     Normal pulmonary pressures     Compared to 08/21/21 no change; previous LVEF 59%, GLS -15%. In 2021 LVEF was   61%,, GLS -19%     PFT - 11/24/2023  FEV1 is normal     FVC is normal     FEV1/FVC ratio is normal       Review of Systems:     Comprehensive review of systems were negative except for symptoms detailed in the   HPI.    Assessment and Plan:   Mx. Reaves is a 28 year old female presenting to GI for:    (Z12.11) Colon cancer screening  (primary encounter diagnosis)    No alarm signs or symptoms identified   PLAN  - Colonoscopy with Gavilyte prep.  - This patient requires Anesthesia support due to the following: Hx of additional time required coming out of anesthesia. An E-consult to Anesthesia has been placed.   - Discussed bowel prep, risks of procedure (including sedation/anesthesia related complications, bleeding, infection, perforation, missed lesions), expectations of procedure scheduling and needing a driver on the day. Pt expressed understanding and opts to proceed.  - Additional time needed: Yes: potentially if anesthesia related issues noted.  - MAC support needed: Yes: per anesthesiologist choice. (OSA, chronic pain meds/high tolerance, anxiety, BMI > 45, prior difficult sedation)  - Send letter of results when completed.    This Phone Visit had 30 minutes of direct interaction (medical discussion), contributing to a total of 35 minutes for the entire encounter, including patient care, record review and follow-up.  Provider was located at Remote Location for this visit and the patient was located at Home in [place]  We discussed the risk, benefits, alternatives, and limitations in an informed decision making fashion and obtained informed consent for the proposed  plan.    If your condition worsens, please call back for a medical visit or go to your nearest urgent care or ER.    Min cheril Contes, ARNP  Department of Gastroenterology  [facility]  Electronically signed by Contes Amadeo Cheril, ARNP at 01/02/2024  6:07 PM PDT
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
## (undated) NOTE — Progress Notes (Signed)
 Formatting of this note is different from the original.  The following assessment has been made by reviewing the current records in the patient's chart.     GI Referral Review    Chart review completed for Am[facility]  [facility].   Referred for a colonoscopy.    Relevant History     History of breast cancer with Chemotherapy induced cardiomyopathy.    Plan     PV with an APP    Grayce Lazar, RN  GI Surveillance Dept.  [place]    Electronically signed by Erle Hawk, RN at 12/13/2023  9:05 AM PDT

## (undated) NOTE — Progress Notes (Signed)
 Formatting of this note is different from the original.  Provider needs to reconcile: None    Patient's main concern: Pap and Lab Test Request     Other needs or issues hoping to address?   None     Care reminders were reviewed:  Pt. Declined all vaccines     Chaperone for Sensitive Physical Exams (In Office):    No orders of the defined types were placed in this encounter.    eCheck-In Action/Status       eCheck-in Steps Patient Action Taken Status    Personal Information  Not Needed    Insurance  Filtered    Payments Completed Completed    ESign Documents  Not Offered    Questionnaires Completed Completed    Medications Verified Completed    Pharmacies Completed Completed    Allergies Verified Completed    Barcode  Completed         Assigned Appointment Questionnaires and Status       Assigned Questionnaires Status    REASON FOR VISIT SELECTION [facility]) Completed           Medications reviewed:       Fri Apr 20, 2024  2:08 PM       Allergies reviewed:         Jun 13, 2024 10:03 AM       Questionnaires completed: Review questionnaire responses  Smoking Tobacco Use: Never   Electronically signed by Daphene Ast, MA at 06/27/2024  5:24 PM PST

## (undated) NOTE — Progress Notes (Signed)
 Formatting of this note might be different from the original.  Office Visit    Assessment/Plan      Relevant encounter diagnosis codes:  (L98.9) Skin abnormalities  (primary encounter diagnosis)    Taken from patient instructions, with provider comments as appropriate:  1. Skin abnormalities (Primary)  - E-CONSULT TELEDERM    Overall benign skin lesions on body.  There is a skin lesion on the thigh, I have consulted with Dermatology regarding.  When I hear back, will send a secure message.    There are no preventive care reminders to display for this patient.  FOLLOW-UP PLAN:    Follow-up as needed.    Subjective    Patient ID: Yolanda Bowers is a 89 year old female         1.  Skin check  History of breast cancer with treatment of chemotherapy, left mastectomy and adjuvant radiation therapy on letrozole .  Here for skin check    Medications, Family and Social History per Epic    Objective    BP 110/70   Pulse 58   Temp 96.9 F (36.1 C)   Ht 5' 5 (1.651 m)   Wt 175 lb (79.4 kg)   SpO2 99%   BMI 29.12 kg/m     Physical Exam  Constitutional:       Appearance: Normal appearance.   Skin:     General: Skin is warm.      Comments: Scattered moles, hemangiomas.  There is one loose skin tissue on the right medial thigh.  Non tender, no swelling.   Neurological:      General: No focal deficit present.      Mental Status: Yolanda Bowers is alert and oriented to person, place, and time. Mental status is at baseline.   Psychiatric:         Mood and Affect: Mood normal.         Behavior: Behavior normal.         Thought Content: Thought content normal.         Judgment: Judgment normal.       Electronically signed by Hulen Poll, PA-C at 01/16/2024  2:34 PM PDT
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
## (undated) NOTE — Telephone Encounter (Signed)
Formatting of this note might be different from the original.  MENTAL HEALTH ACCESS CENTER CARE CHAT    Are you PHYSICALLY safe right now? Yes    WHO is chatting in: Member chatting in   ---   CORE/PPO   ---   PATIENT CALLING FOR INFORMATION ONLY or NON MHAC Related items - pt requesting to schedule their appt with their SW. Advised pt to phone [facility] SW and provided contact #  * If scheduling to [facility]/Resource Schedule - was flowsheet SMS Sent? N/A - not scheduled to [facility]    CHAT TRANSCRIPT (copy/paste chat here): system 12:13:33 PM    Welcome to our Mental Health Care Chat Channel. We are here to assist you with getting connected to mental health services. Our Mental Health Access Center can also be reached at <a class='tel-link' href='tel:[phone]'>[phone]</a>. Please begin the conversation by providing a summary of your current concerns and needs.    Your current position in queue is: 3       Anesa Lindseth 12:14:44 PM  Hi. How do I make an appt. With my counselor I have been seeing online? The app is confusing and I cannot locate where to reach out to make my next appt.   system 12:23:42 PM    Your current position in queue is: 1    Crystal Leticia Clas has joined the session 12:25:41 PM  [employer] 12:25:54 PM    Hello and thank you for chatting into our Mental Health Care Chat.  My name is Crystal and I will be assisting you.      As I pull up your chart, please verify your first and last name as well as your date of birth.     Please verify if you are physically safe right now and located in [place].      Shamir Bartelt 12:26:15 PM  Yes   Crystal Rivera 12:27:01 PM    Hi, As I pull up your chart, please verify your first and last name as well as your date of birth.     Please verify if you are physically safe right now and located in [place].      Naijah Toback 12:27:28 PM  Staria suen   [birth year] I am at home and I am safe In [facility] [place] 12:28:58 PM    Hi Makell, thank you  for that information. Are you looking to schedule an appt with Lenise Herald?      Kaliann Mcgaughey 12:29:23 PM  Yes   Jacquiline Doe 12:29:52 PM    Okay, so it looks like Lenise Herald is with Social Work [employer]. Social Work scheduling can be reached directly at [phone] 8am-5pm.      Danisa Lad 12:30:30 PM  Ok thank you. Is there a way to book appts online? Just curious   [employer] 12:30:47 PM    Unfortunately, not at this time      Lacie Puls 12:31:16 PM  Ok thank you have a great day!   Crystal Rivera 12:31:23 PM    You as well! Take care    Heleena Dobos has left the session 12:31:25 PM  Crystal Leticia Clas has left the session 12:31:25 PM    This Care Chat consisted of  5.46  minutes of total time.    Patient was recommended to seek medical attention if symptoms worsen or fail to improve or if they are not able to keep themselves safe by reaching out to [employer] Nurse  Line [phone] (24/7), going to the nearest emergency room or calling 911. The Mental Health and Wellness Access Center number was also given [phone] for non-emergent needs.  Electronically signed by Jacquiline Doe at 03/02/2023 12:33 PM PDT
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
## (undated) NOTE — Assessment & Plan Note (Signed)
 Associated Problem(s): Malignant neoplasm of upper-outer quadrant of left breast in female, estrogen receptor positive  Formatting of this note might be different from the original.  On letrozole  to decrease recurrence   Electronically signed by Iva Abernethy, MD at 09/24/2024  3:34 PM PST

## (undated) NOTE — Progress Notes (Signed)
 Formatting of this note might be different from the original.  Benign urinalysis. Reflexed to microscopy. normal WBC amount.  Electronically signed by Dario Guardian, PA-C at 08/25/2023  5:27 PM PST

## (undated) NOTE — Progress Notes (Signed)
 Formatting of this note is different from the original.  Images from the original note were not included.  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 Part of being the pilot means that actually your needs, your health, your energy is one of the most important things in this journey and it is OK if upsets your son, or others.    Treatment Goal/Plan     Collaborative therapy goal (what we can work on together): Mange and process stressors, make decisions around life events and relationship, learn coping skills, manage depressive symptoms, manage anxiety level, be honest with myself and see if I want to keep doing this job    What strengths or strategies I can use to support my goal (self-care, skills, resources): Creativity, relationship is supportive    Treatment modality: Cognitive Behavior Therapy (CBT)    What will be different when I achieve my therapy goal: I will know if I am ready for a career change or if I am ready to move to another country, discover possibilities    Last updated by Agustina Caroli, [facility] on 05/26/2023        Treatment Progress: 4 - Partial response: Patient is actively working toward treatment goal(s) with positive trends in data or functioning        Follow up: 3. Based on clinical need and assessment of risk, recommend member return within 4weeks: reason client and clinician out of town timeframe.    Interval History     Chief Complaint   Patient presents with    Depression    Stress     Therapeutic Alliance Discussion: Review Therapeutic Alliance responses and discuss feedback with patient.      Suicidal Ideation: Denies suicidal  ideation.    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    She is still planning on taking son to [place] over the summer.    She had a visit with the survivorship clinic and it was very helpful, but also reading the summary, seeing the words "high risk" was extremely distressing.    She is excited about [place], looking at apartments for the summer over spring break.    Yolanda Bowers and MLT discuss pilot analogy as it pertains to her son, and that as the pilot, it is actually the most important thing to take care of herself and meet her needs. Pilot's jobs aren't to make the passengers happy, it is to keep them safe, and sometimes that means doing things that are inconvenient (landing early, delaying take off) and that means it is OK for her to put her needs as the pilot first even if it upsets others.    The treatment modality of Cognitive Behavior Therapy (CBT) Informed the treatment strategy: Psychoeducation, Problem-Solving Therapy, Affect Regulation, Cognitive Coping, Validation, and Supportive listening      11/23/2023   Therapeutic Alliance   With  this provider, we work on and talk about what is really on my mind. 4   I feel heard, understood, and respected by this provider. 4   I understand and agree with how we are approaching my concerns. 4   TA Total Score 12       Patient-reported      Synopsis SmartLink 11/23/2023    09:01 11/09/2023    14:48   Score and Results   Global Distress Score 15  18    Global Distress Results Moderate  Moderate    GAD-2 Score 3  4    GAD-7 Score 8  10    GAD-7 Results 5-9: Mild  10-14: Moderate    PHQ9 Score 10  10    PHQ-9 Results Moderate depression  Moderate depression    Audit-C Results Low Level Use  Low Level Use    IPV, Psychosis, Rx, Gun, Hospitalization   Do you  have access to firearms? 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

## (undated) NOTE — OR Surgeon (Signed)
 Formatting of this note is different from the original.  Colonoscopy Procedure Note    Colonoscopy     Indications: Yolanda Bowers is a 73 year old female who presents for a colonoscopy for Pre-Op Diagnosis Codes:      * Colon cancer screening [Z12.11]..    Instrument:  BVU COLONOSCOPE PCF-HQ190L ([id number])    Medications: Per Anesthesia    Monitoring: Pulse oximetry and blood pressure monitoring were done throughout the procedure.    Procedure: After a digital examination of the anorectum, the video colonoscope was inserted into the rectum and advanced through the colon to the cecum. The cecal location was identified by visualization of the ileocecal valve and appendiceal orifice. The colonoscope was then advanced into the final 5 cm of the terminal ileum. The colonoscope was then slowly withdrawn in a retrograde panoramic fashion and the lumen of the colon carefully inspected, including a retroflexed view of the anorectum. Findings are described below.    Procedure Difficulty: Without difficulty.    Findings:   Terminal Ileum - normal  Cecum - Normal  Ascending Colon - Normal  Transverse Colon - Normal  Descending Colon - one 4 mm sessile polyp(s) (removed with jumbo biopsy forceps)  Sigmoid Colon - Normal  Rectum - Normal  Anorectum - normal    Colon preparation:   Right: Excellent (3)  Middle: Excellent (3)  Left: Excellent (3)    Complications/Limitations: None (Blood Transfusion - N/A, Implants and Grafts - N/A).    Estimated Blood Loss: Minimal    Specimens Removed:   Polyps: #1: < 5 mm in the Descending        Impression and Post Procedure Diagnosis:     Impression:   1 polyp(s) removed, see Findings for details.  Normal Terminal Ileum.      Plan:   - Await pathology results.        Gattis Pen, MD    06/13/2024 10:00 AM    Electronically signed by Pen Gattis, MD at 06/13/2024 10:01 AM PDT

## (undated) NOTE — Progress Notes (Signed)
 Formatting of this note is different from the original.  E-consult    Reason for consultation:     Yolanda CHRISTELLA Filler, PA-C asks:  E-consult Clinical Question: anything recommended re: adrenal hypertrophy noted on CT  Pertinent patient history: CT ordered d/t bloating and history breast CA  Please use PCP name to sign recommended labs, imaging, medications (order as Fill later) under my name.    Review of online records:     Yolanda Bowers is 22 year old with abnormal adrenal imaging on recent CT that was not a dedicated adrenal imaging study; no prior mention of adrenal abnormalities on prior imaging (no dedicated adrenal imaging)    non dedicated adrenal imaging:  CT ABD & PELV, WITH IV CONTRAST (05/08/2024 5:24 PM)   Mild adrenal hypertrophy without discrete nodules evident     CT CHEST & ABD & PELV, WITH IV CONTRAST (05/02/2022 4:30 PM) Adrenals: Normal.     CT CHEST, WITH IV CONTRAST (11/11/2021 2:44 PM) Upper abdomen: Visualized soft tissues of the upper abdomen are   unremarkable.     CT CHEST, W/O IV CONTRAST (05/13/2021 1:40 PM) Upper abdomen:  Unremarkable     CT CHEST, W/O IV CONTRAST (05/03/2020 10:44 AM) Upper abdomen:  Unremarkable     CT CHEST, W/O IV CONTRAST (11/06/2019 3:54 AM) Upper abdomen: Unremarkable     CT ANGIO CHEST ([place]) (02/21/2019 4:52 PM) Upper abdomen: Unremarkable     *NUC_MED_PET_ONCOLOGY_(NOT MELANOMA) (12/14/2018 10:08 AM) The adrenal glands and kidneys have a normal noncontrast appearance.     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 I had a rash twice after recieving chemo.    Allergy, unspecified not elsewhere classified      - Patient reported 05/25/2009    Anemia I had it as a teen and then again during chemo    Anxiety 20s and off and on.    Asthma Althetic enduced from [age] in college.    Back pain Started recently. Lower back    Bladder infection A few times since my 21s    Cancer Breast cancer April 2020    COVID-19 Disease Had it 3/21    Depression Off and on in my late  30s and 40 s    Eczema Dry skin since chemo    Heart failure March 2021 from chemo    History of breast cancer     Memory loss of During chemo, mostly after August 2020    Migraines     Vision disorder Started wearing glasses at around 5     Past Surgical History:  Procedure Laterality Date    BREAST RECONS W/ OTHER TECHNIQUE  Reduction in right and mastecomy of left    CARDIAC SURGERY  July 2021 procedure to fix issues done at [facility] NODE BIOPSY  April 2020 and 5 lymphnodes  removed Jun 25 2019    TONSILLECTOMY PRIM/SECONDARY < AGE 68       - Patient reported 05/25/2009     Current Outpatient Medications on File Prior to Visit   Medication Sig Dispense Refill    metoprolol  succinate (TOPROL  XL) 25 mg extended release (24 hr) tablet Take 1 tablet (25 mg) by mouth 2 times daily 180 tablet 2    acetaminophen  (TYLENOL ) 500 mg tablet Take 500-1,000 mg by mouth as needed      cholecalciferol (vit D3) 50 mcg (2,000 unit) Cap Take 5,000 units by mouth daily      cyanocobalamin (VITAMIN B12) 500 mcg tablet Take 500 mcg by mouth daily (Patient not taking: Reported on 04/20/2024)      polyethylene glycol (MIRALAX ) 17 gram/dose oral  powder Dissolve 17 g into specified amount of water & drink by mouth daily 238 g 0    venlafaxine  (EFFEXOR  XR) 75 mg extended release (24 hr) capsule Take 2 capsules (150 mg) by mouth every morning 180 capsule 0    valsartan  (DIOVAN ) 40 mg tablet Take 1 tablet (40 mg) by mouth 2 times daily 180 tablet 2    letrozole  (FEMARA ) 2.5 mg tablet Take 1 tablet (2.5 mg) by mouth daily 90 tablet 3    [DISCONTINUED] spironolactone  (ALDACTONE ) 25 mg tablet Take 1 tablet (25 mg) by mouth daily. 90 tablet 3    aspirin 81 mg delayed release tablet Take 81 mg by mouth daily      [DISCONTINUED] lisinopriL  (ZESTRIL ) 2.5 mg tablet Take 1 tablet (2.5 mg) by mouth 2 times daily 180 tablet 3    cetirizine  HCl (ZYRTEC  ORAL) Take by mouth       No current facility-administered medications on file prior to visit.       Allergies   Allergen Reactions    Codeine Nausea and Vomit    Lisinopril  Cough    Paclitaxel  Hives    Walnut Rash     Family History   Problem Relation Name Age of Onset    Breast CA (mother,sister,aunt) Maternal Grandmother Matgareet 25    Vision loss Maternal Grandmother Matgareet     Birth defects Maternal Grandmother Matgareet     Stroke Other          grandmother    Ovarian Cancer Paternal Aunt  42    Cancer - Ovarian Paternal Aunt Merlynn     Diabetes Mother Jan     Drug abuse Mother Jan     Hearing loss Mother Jan     High blood pressure Mother Jan     Mental Illness - other Mother Jan     Vision loss Mother Jan     Depression Mother Jan     Diabetes Sister Steph     Stroke Paternal Grandmother Velma     Cancer, Other Maternal Grandfather Alex     Alcohol abuse Maternal Grandfather Alex     Arthritis - Rheumatoid Paternal Aunt Ruby      Social History     Social History Narrative    Yolanda Bowers's occupation: Solicitor)      She is married, lives with spouse and lives with son.  Religion and ethnic background (include country of origin for lab purposes): white    Primary language: English    Need for  interpreter: no    Father of this baby: spouse - Yolanda Bowers,     age: 77    ethnic background: white    occupation: mountain guide      (Entered by ROCK BOGUS, RN on 05/23/2007)       Assessment:     Abnormal adrenal imaging, NOS, no discrete nodule and very subtle abnormality    No studies were dedicated adrenal imaging    Recommendations for referring clinician:      Dedicated adrenal CT or adrenal MRI in 3 months for follow up.  May perform sooner (now)  if patient prefers  If adrenal nodule on follow up imaging then further endocrine assessment necessary at that time per QCG.  Endocrinology Quick Care Guide    Recommendations for the patient:    Patient is MyChart Active.  Next Steps For The Patient : A member of your care team will contact you to review these recommendations and discuss next steps.    Electronically signed by Maranda Lauraine Reusing, MD at 05/23/2024  8:00 PM PDT

## (undated) NOTE — Progress Notes (Signed)
 Formatting of this note is different from the original.  Office Visit    Subjective:   Patient ID: Yolanda Bowers is a 89 year old female     Reason for Visit Questionnaire   Primary Reason for Visit: Back Pain   Symptom Description Lower and middle back pain    Prior History of This Concern Yes   Symptom Duration For more than a month   Symptom Frequency Constantly   Symptom Getting Better or Worse Worsening   Treatments Patient Has Tried Exercise, rest, stretching, eating better, Tylenol    Possible Symptom Cause Not sure   Symptom Worries I am having bowel changes too and lower abdominal pain as well as ongoing fatigue and more night sweats again that are drenching.   Other Medical Concerns No   Other Medical Concerns- Details        Review questionnaire responses  Medications, Family and Social History per Epic  Patient provided verbal consent to be recorded using documentation assistance tool.   BP 117/84 (BP Location: Left arm, Patient Position: Sitting)   Pulse 62   Temp 97.7 F (36.5 C) (Temporal)   Ht 5' 4.49 (1.638 m)   Wt 172 lb 1.6 oz (78.1 kg)   LMP 12/22/2018 (Approximate)   SpO2 100%   BMI 29.10 kg/m     Physical Exam  Constitutional:       Appearance: Normal appearance.   HENT:      Head: Normocephalic and atraumatic.      Mouth/Throat:      Mouth: Mucous membranes are moist.      Pharynx: Oropharynx is clear.   Cardiovascular:      Rate and Rhythm: Normal rate and regular rhythm.      Heart sounds: No murmur heard.  Pulmonary:      Effort: Pulmonary effort is normal.      Breath sounds: Normal breath sounds. No wheezing.   Abdominal:      General: Abdomen is flat.      Palpations: Abdomen is soft.   Musculoskeletal:         General: Normal range of motion.   Skin:     General: Skin is warm.   Neurological:      General: No focal deficit present.      Mental Status: Yolanda Bowers is alert.   Psychiatric:         Mood and Affect: Mood normal.     Physical Exam    History of Present Illness  Yolanda L  Bowers is a 20 year old female who presents with persistent abdominal cramping and back pain.    She has been experiencing abdominal cramping and lower back pain since June, describing it as similar to menstrual cramps. The pain is persistent, intermittent, and not alleviated by exercise or stretching. It sometimes worsens after physical activities such as hiking or mowing the lawn and can disrupt her sleep. She has attempted to relieve the symptoms with laxatives, suspecting constipation, but without success.    She reports constipation with bowel movements every two to three days, often requiring straining. She occasionally uses plant-based stool softeners. Morning nausea is present, and she sometimes takes Compazine, which she believes exacerbates her constipation. She has noticed increased gas and a sensation of fullness despite a light diet, including salads and broccoli.    She has a history of lower back pain with a 'crunchy feeling' in her spine for several years. The pain is located in the lower back,  above the underwear line, and sometimes radiates to the back of her right leg, which she attributes to varicose veins. She experiences numbness in her left foot, which she associates with neuropathy. She has had a few falls but does not believe they are related to her back pain.    Her family history includes three great aunts on her father's side who had colon cancer in their seventies but lived to a hundred. She is unaware of any colonoscopies or polyp removals in her immediate family.    She is currently taking low-dose aspirin, metoprolol , and valsartan  for her heart condition and avoids ibuprofen  due to her heart medications.    Assessment and Plan:         Assessment & Plan  Constipation with associated abdominal and pelvic pain  Chronic constipation with abdominal cramping and pelvic pain since June. Differential primarily includes constipation. Family history of colon cancer noted. Irregular bowel  movements every two to three days, with nausea and gas. Recent travel and dietary changes may contribute.  - Order abdominal x-ray to assess stool burden.  - Schedule pelvic ultrasound to evaluate ovaries.  - Recommend daily use of Miralax  and Metamucil.  - Advise monitoring symptoms and follow-up if no improvement.    Chronic low back pain  Chronic low back pain since June, localized above the underwear line. Differential includes nerve pinching, arthritis, or compression fracture. No significant radiation to legs, but numbness in left foot due to known neuropathy. Experienced falls.  - Order x-ray of the lumbar spine to rule out compression fracture and assess for arthritis.  - Refer to physical therapy for six weeks.  - Advise against use of methocarbamol  for current symptoms.  - Recommend monitoring symptoms and consider further imaging if no improvement after physical therapy.    Peripheral neuropathy, left foot  Peripheral neuropathy in the left foot with numbness present for about a month. No new numbness or tingling reported.  - Continue current management with supportive footwear.  1. Abdominal pain, unspecified abdominal location  -     X-RAY ABDOMEN 2V AP SUP+AP UPRIGHT OR DECUB  -     polyethylene glycol (MIRALAX ) 17 gram/dose oral powder; Dissolve 17 g into specified amount of water & drink by mouth daily, OTC/Non-Rx, Disp-238 g, R-0  Dispense: 238 g; Refill: 0  If normal bms and no stool on xray, need further evaluation with CT and gastroenterology referral   2. Low back pain without sciatica, unspecified back pain laterality, unspecified chronicity  -     REF PHYSICAL THERAPY INTERNAL  -     X-RAY SPINE LUMBAR 2V OR 3V (STD) AP+LAT+LAT-L5  -     REF PHYSICAL THERAPY INTERNAL    3. Pelvic pain  -     U/S PELVIS (TRANSVAGINAL AND/OR ABDOMINAL) (AKA PELVIC ULTRASOUND)    4. Chemotherapy-induced neuropathy  Assessment & Plan:  Not on meds. Tolerable. Numbness in left great toe .     5. CPT2  deficiency  Assessment & Plan:  Asymptomatic. Is a carrier. Plans to have her kids tested sometime           Electronically signed by Iva Abernethy, MD at 04/20/2024  3:01 PM PDT

## (undated) NOTE — Progress Notes (Signed)
 Formatting of this note is different from the original.  Provider needs to reconcile: None    Patient's main concern: Pain (Lower back x all summer.)     Other needs or issues hoping to address?   Abdominal cramping all summer. pt mentions it may be related to the back, pain.    Care reminders were reviewed:  Yes: Patient Instructions provided    Chaperone for Sensitive Physical Exams (In Office):  Not Applicable    Orders Placed This Encounter    acetaminophen  (TYLENOL ) 500 mg tablet    cholecalciferol (vit D3) 50 mcg (2,000 unit) Cap    cyanocobalamin (VITAMIN B12) 500 mcg tablet     eCheck-In Action/Status       eCheck-in Steps Patient Action Taken Status    Personal Information Completed Completed    Insurance  Filtered    Payments Completed Completed    ESign Documents  Not Offered    Questionnaires Completed Completed    Medications Verified Completed    Pharmacies Completed Completed    Allergies Verified Completed    Barcode  Completed         Assigned Appointment Questionnaires and Status       Assigned Questionnaires Status    REASON FOR VISIT SELECTION [facility]) Completed    BRIEF SOCIAL HEALTH SCREENER [place] Completed           Medications reviewed:       Fri Apr 20, 2024  2:08 PM       Allergies reviewed:         Apr 20, 2024  2:07 PM       Questionnaires completed: Review questionnaire responses  Smoking Tobacco Use: Never    Electronically signed by Milta Ling at 04/20/2024  3:01 PM PDT

## (undated) NOTE — Assessment & Plan Note (Signed)
Associated Problem(s): CPT2 deficiency  Formatting of this note might be different from the original.  Asymptomatic. Is a carrier. Plans to have her kids tested sometime .   Electronically signed by Daine Floras, MD at 04/18/2023 12:22 PM PDT

## (undated) NOTE — Assessment & Plan Note (Signed)
 Associated Problem(s): Chemotherapy-induced neuropathy  Formatting of this note might be different from the original.  Not on meds. Tolerable. Numbness in left great toe .   Electronically signed by Iva Abernethy, MD at 04/20/2024  2:30 PM PDT

## (undated) NOTE — Progress Notes (Signed)
Formatting of this note is different from the original.  Office Visit    Assessment & Plan     1. Pain in both feet  From ankle down which is making it difficult to walk and ankle feels weak. Pain only from weight bearing. Xrays are unrevealing. Advised on sm to review also with onc regarding pause on letrazole and doing brain mri. Could be in part due to neuropathy?  - X-RAY FOOT 3V (STD) AP+OBLQ+LAT  - X-RAY SPINE LUMBAR 2V OR 3V (STD) AP+LAT+LAT-L5    2. Acute midline low back pain without sciatica  X 2 weeks. Cause unclear. Tender in the lower back/spine. Xray is unrevealing. 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   - X-RAY SPINE LUMBAR 2V OR 3V (STD) AP+LAT+LAT-L5    (G62.0,  T45.1X5A) Chemotherapy-induced neuropathy    Health Maintenance Due   Topic Date Due    Vaccine: COVID-19 (7 - 2023-24 season) 08/25/2022    Blood Pressure Check  12/12/2022    Depression F/U: [facility] Monitoring Tool  05/14/2023     FOLLOW-UP PLAN:    If xrays negative consider trial off letrazole and also discuss with onc team regarding mri brain.     Subjective    Patient ID: Yolanda Bowers is a 53 year old female     Reason for Visit Questionnaire   Primary Reason for Visit: Other   Symptom Description Foot pain and lower back pain    Prior History of This Concern No   Symptom Duration For more than a month   Symptom Frequency Constantly   Symptom Getting Better or Worse Worsening   Treatments Patient Has Tried Warm baths, pain meds (Tylenol)   Possible Symptom Cause On my feet all day?   Symptom Worries It's exhausting. My feet hurt so much at the end of the day or after doing anything when I'm on my feet for more than a few hours. My lower back is also hurting a lot in the past week. It has become worse.   Other Medical Concerns No   Other Medical Concerns- Details       Review questionnaire responses    Bilateral feet pain over a month. 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 It is sore that she could hardly  walk. Feels like a deep pain. It is at the dorsum and right more than the left. Finds herself shuffling in the morning. Resting has not made a difference. Saw onc and wonders if it is letrozole which she has been on for years. Put about some weight, not sure. No known injury but has fallen. Onset was gradual. Lower back hurts too a few weeks now. This comes and goes and daily. No radiation though. No numbness or tingling. Ankles feel weak in the morning. No foot drag. Pain is a deep ache only with weight bearing. Cannot do exercises due to pain.     Medications, Family and Social History per Epic    Objective    BP 120/78 (BP Location: Left arm, Patient Position: Sitting)   Pulse 72   Temp 98 F (36.7 C) (Temporal)   Wt 172 lb 11.2 oz (78.3 kg)   SpO2 98%   BMI 27.87 kg/m     Physical Exam    Dp and pt pulses are strong bilaterally. Nontender feet. Normal gait. Nontender ankle ligaments. Tender over the right 4th mid metatarsal without swelling or redness and mild.   Back:  tender over the lumbar spine mild. Flexion and extension are full. Neg straight leg raise bilaterally. Dtr 2/4 bilateral patella and absent bilateral achilles.     A total of 30 minutes were spent on today's encounter.   Time may include direct patient care, reviewing pertinent records & follow-up activity.    Electronically signed by Bennye Alm, MD at 02/15/2023 12:27 PM PDT

## (undated) NOTE — Telephone Encounter (Signed)
 Formatting of this note might be different from the original.  Refill(s) are unable to be approved by the Centralized Refill Management Program as the requested medication(s) requires provider review and authorization. Please review request and take appropriate action. If medication is denied or additional follow up is needed, please work with your care team to notify patient.    Hey Ruther Player, PharmD  Centralized Refill Management    Electronically signed by Player Walterine Ruther, RPh at 04/08/2024 11:19 AM PDT

## (undated) NOTE — Progress Notes (Signed)
 Formatting of this note is different from the original.  Provider needs to reconcile: None    Patient's main concern: Skin Problems skin check     Other needs or issues hoping to address?       No Care Reminders Due    Chaperone for Sensitive Physical Exams (In Office):  N/A    No orders of the defined types were placed in this encounter.    eCheck-In Action/Status       eCheck-in Steps Patient Action Taken Status    Personal Information  Not Needed    Payments  Not Started    ESign Documents  Not Offered    Questionnaires  Not Started    Medications  Not Started    Allergies  Not Started    Barcode  Not Started         Assigned Appointment Questionnaires and Status       Assigned Questionnaires Status    REASON FOR VISIT SELECTION [facility]) Assigned           Medications reviewed:       Mon Apr 18, 2023 10:51 AM       Allergies reviewed:         Jun 10, 2023  4:56 PM       Questionnaires completed: No questionnaires completed  Smoking Tobacco Use: Never   Electronically signed by Einar Faden, Tech at 01/16/2024  2:34 PM PDT

## (undated) NOTE — Telephone Encounter (Signed)
Pending Prescriptions: Disp Refills   venlafaxine (EFFEXOR XR) 75 mg extended re*90 cap*3   Sig: Take 1 capsule (75 mg) by mouth every morning    -------------------------------------------------------------------    Electronically signed by Ethlyn Gallery, RPh at 04/27/2023  9:26 AM PDT

## (undated) NOTE — Progress Notes (Signed)
 Formatting of this note is different from the original.  Images from the original note were not included.    Diagnosis and Treatment Plan   (F33.9) Major depressive disorder, recurrent episode with anxious distress  (primary encounter diagnosis)  Comment: Global Distress 20  Plan: PSYCHOTHERAPY 45 MIN PATIENT    Patient Instructions   Continue reinforcing the boundary you've set with Sid around limiting conversations during work hours. Prioritizing your own time and responsibilities is an important act of self-care and helps protect your emotional energy.    The finalization of your divorce is a major milestone. While it may bring relief, it?s also normal to feel a range of emotions. Allow yourself time to process this transition and recognize the strength it took to get here.    Renewing the protection order is a courageous and necessary step. It?s understandable to feel stress about your ex-husband?s reaction, but your safety comes first. Continue problem-solving around this--like arranging to stay elsewhere or having your oldest son nearby--to help ease anxiety and increase your sense of control.    Treatment Goal/Plan     Effective Treatment Plan Goals are Specific, Measurable, Achievable, Realistic, and Time-bound (SMART). Integrate these elements into the sections below.     Treatment goal: Identify and implement stress management techniques, enhance coping strategies, and make informed decisions about my job and relationships    As measured by: Reduce PHQ 9 score to under 5 and GAD score to 1 or below. Make a decision about moving.    Anticipated time needed to accomplish this goal: 3 months     Last updated by Lucia Powell Ee, [facility] on 04/04/2024        Treatment Progress: 4 - Partial response: Patient is actively working toward treatment goal(s) with positive trends in data or functioning        Follow up: 2. Based on clinical need and assessment of risk, it is recommended the member return within the next  14 calendar days by 05/29/24. Therapist has availability for appt within 14 calendar days. Patient sent to scheduling clerk to book appt. Needs later afternoon on the 8th    Interval History     Chief Complaint   Patient presents with    Depression     Therapeutic Alliance Discussion: Review Therapeutic Alliance responses and discuss feedback with patient.      Suicidal Ideation: Denies suicidal ideation.    Session Summary:     Visit to [place] was tense    Yolanda Bowers shared that during her recent trip to [place], [place] was making odd and inappropriate jokes that left her feeling uncomfortable. She expressed concern that these behaviors might be indicative of cognitive decline or early signs of dementia. In response to ongoing challenges with Sid, Teralyn has set a clear boundary around limiting conversations during work hours, emphasizing her need to prioritize her own responsibilities and well-being.  She reported feeling extremely exhausted, both physically and emotionally, but continues to take steps to protect her mental health. Yolanda Bowers shared that her divorce has been finalized, and she was relieved to learn she does not have to give her ex-husband anything beyond half of the house equity--no retirement or social security division required. While this outcome brought some relief, she is currently navigating the stress of renewing her protection order. Yolanda Bowers expressed anxiety about her ex-husband?s reaction to being served again but affirmed her understanding that this step is necessary for her safety. She has actively problem-solved around this concern, including  making plans to stay elsewhere temporarily or having her oldest son stay with her during the initial days following service. Yolanda Bowers appeared thoughtful and proactive during the session, demonstrating resilience and a commitment to maintaining her safety and emotional boundaries.    The treatment modality of Cognitive Behavior Therapy (CBT) Informed the treatment  strategy: Psychoeducation, Problem-Solving Therapy, Cognitive Coping, Validation, and Supportive listening      05/15/2024   Therapeutic Alliance   With this provider, we work on and talk about what is really on my mind. 4   I feel heard, understood, and respected by this provider. 4   I understand and agree with how we are approaching my concerns. 4   TA Total Score 12       Patient-reported       05/15/2024    12:15 04/20/2024    14:00   Score and Results   Global Distress Score 20     Global Distress Results Moderate     GAD-2 Score 2     GAD-7 Score 7     GAD-7 Results 5-9: Mild     PHQ9 Score 14  10   PHQ-9 Results Moderate depression  Moderate depression   Audit-C Results Low Level Use  Low Level Use   IPV, Psychosis, Rx, Gun, Hospitalization   Do you have access to firearms? 0 0      Patient-reported     [facility] Entry Field    Mental Status Features   Only Note Clinically Significant changes to Mental Status Features: MHW Notable Mental Status Features: No Clinically significant changes since last noted    Substance Use Conversation: N/A    Visit Type   Patient was given disclosure statement: N/A    Session attended by: Patient     Duration of Visit: 45 minutes    This visit was completed via Video Visit: Location of patient: [employer] [place] in [place] Location of provider: Home office      Electronically signed by Lucia Powell Ee, [facility] at 05/16/2024 10:06 AM PDT

## (undated) NOTE — Assessment & Plan Note (Signed)
 Associated Problem(s): CPT2 deficiency  Formatting of this note might be different from the original.  Asymptomatic. Is a carrier. Plans to have her kids tested sometime   Electronically signed by Iva Abernethy, MD at 04/20/2024  2:26 PM PDT

## (undated) NOTE — Progress Notes (Signed)
 Formatting of this note might be different from the original.  Attempted to contact the patient via phone to complete pre-visit prep. SM sent to the pt to complete E checkin   Electronically signed by Rankin, Angeline, MA at 04/20/2024  3:01 PM PDT

## (undated) NOTE — Progress Notes (Signed)
Formatting of this note might be different from the original.  Attempted to contact the patient via phone to complete pre-visit prep. SM sent with reminder to complete E-CHECK IN.    Electronically signed by Marshell Levan, [place] at 02/15/2023 12:27 PM PDT

---

## 1998-01-11 ENCOUNTER — Inpatient Hospital Stay (HOSPITAL_COMMUNITY): Admission: AD | Admit: 1998-01-11 | Discharge: 1998-01-18 | Payer: Self-pay | Admitting: Obstetrics & Gynecology

## 2011-12-11 ENCOUNTER — Encounter (HOSPITAL_BASED_OUTPATIENT_CLINIC_OR_DEPARTMENT_OTHER): Payer: Self-pay | Admitting: Emergency Medicine

## 2011-12-11 ENCOUNTER — Emergency Department (HOSPITAL_BASED_OUTPATIENT_CLINIC_OR_DEPARTMENT_OTHER)
Admission: EM | Admit: 2011-12-11 | Discharge: 2011-12-11 | Disposition: A | Discharge status: Home / Self Care | Payer: Self-pay | Attending: Emergency Medicine | Admitting: Emergency Medicine

## 2011-12-11 ENCOUNTER — Emergency Department (INDEPENDENT_AMBULATORY_CARE_PROVIDER_SITE_OTHER): Payer: Self-pay

## 2011-12-11 DIAGNOSIS — M25469 Effusion, unspecified knee: Secondary | ICD-10-CM

## 2011-12-11 DIAGNOSIS — M25569 Pain in unspecified knee: Secondary | ICD-10-CM

## 2011-12-11 DIAGNOSIS — F17200 Nicotine dependence, unspecified, uncomplicated: Secondary | ICD-10-CM | POA: Insufficient documentation

## 2011-12-11 DIAGNOSIS — M2392 Unspecified internal derangement of left knee: Secondary | ICD-10-CM

## 2011-12-11 DIAGNOSIS — M2390 Unspecified internal derangement of unspecified knee: Secondary | ICD-10-CM | POA: Insufficient documentation

## 2011-12-11 DIAGNOSIS — W19XXXA Unspecified fall, initial encounter: Secondary | ICD-10-CM

## 2011-12-11 DIAGNOSIS — M7989 Other specified soft tissue disorders: Secondary | ICD-10-CM

## 2011-12-11 MED ORDER — OXYCODONE-ACETAMINOPHEN 5-325 MG PO TABS: 2.0000 | ORAL_TABLET | Freq: Three times a day (TID) | ORAL | Status: AC | PRN

## 2011-12-11 NOTE — ED Provider Notes (Signed)
History     CSN: [id number]  Arrival date & time 12/11/11  4098   First MD Initiated Contact with Patient 12/11/11 [phone]      Chief Complaint  Patient presents with  . Knee Pain    (Consider location/radiation/quality/duration/timing/severity/associated sxs/prior treatment) HPI This 41 year old female fell and hurt her left anterior knee falling out a couple months ago and has had pain to the left knee 24 hours a day since that time. She is able to walk she is able to fully flex her knee she has no limited range of motion she has no weakness or numbness in the legs she has no pain or hip ankle or foot, she has pain to the knee almost 24 hours a day which is sometimes minimal sometimes severe her knee does not lock but occasionally gives way with weakness feeling that something happens she does have mild swelling to the knee but no redness or excessive warmth to the knee and it has been present for the last couple months. She has not seen a doctor for this but she just got Medicaid yesterday so she came to the ED today for evaluation. She does not have an orthopedic surgeon. She is on chronic steroids for her Crohn's disease. She is currently taking 40 mg per day of prednisone. She's had no fevers. She currently has no abdominal pain bloody stools chest pain shortness breath or other systemic illness type symptoms. Past Medical History  Diagnosis Date  . Crohn's disease     Past Surgical History  Procedure Date  . Tubal ligation     No family history on file.  History  Substance Use Topics  . Smoking status: Current Everyday Smoker  . Smokeless tobacco: Not on file  . Alcohol Use: No    OB History    Grav Para Term Preterm Abortions TAB SAB Ect Mult Living                  Review of Systems  Constitutional: Negative for fever.       10 Systems reviewed and are negative for acute change except as noted in the HPI.  HENT: Negative for congestion.   Eyes: Negative for  discharge and redness.  Respiratory: Negative for cough and shortness of breath.   Cardiovascular: Negative for chest pain.  Gastrointestinal: Negative for vomiting and abdominal pain.  Musculoskeletal: Negative for back pain.  Skin: Negative for rash.  Neurological: Negative for syncope, numbness and headaches.  Psychiatric/Behavioral:       No behavior change.    Allergies  Review of patient's allergies indicates no known allergies.  Home Medications   Current Outpatient Rx  Name Route Sig Dispense Refill  . AZATHIOPRINE 50 MG PO TABS Oral Take 250 mg by mouth daily.    Marland Kitchen PREDNISONE 10 MG PO TABS Oral Take 40 mg by mouth daily.    . OXYCODONE-ACETAMINOPHEN 5-325 MG PO TABS Oral Take 2 tablets by mouth every 8 (eight) hours as needed for pain. 20 tablet 0    BP 158/109  Pulse 63  Temp(Src) 97.5 F (36.4 C) (Oral)  Resp 18  Ht 5\' 10"  (1.778 m)  Wt 216 lb (97.977 kg)  BMI 30.99 kg/m2  SpO2 100%  LMP 12/04/2011  Physical Exam  Nursing note and vitals reviewed. Constitutional:       Awake, alert, nontoxic appearance.  HENT:  Head: Atraumatic.  Eyes: Right eye exhibits no discharge. Left eye exhibits no discharge.  Neck: Neck  supple.  Pulmonary/Chest: Effort normal. She exhibits no tenderness.  Abdominal: Soft. There is no tenderness. There is no rebound.  Musculoskeletal: She exhibits no tenderness.       Baseline ROM, no obvious new focal weakness. Both arms and right leg and nontender. Left leg is no tenderness to the ankle or foot. Left foot Mr. Salas pedis pulse intact with normal light touch and capillary refill less than 2 seconds in all toes. She is out of 5 strength with normal light touch in her left leg. Left knee is nontender but does have a palpable effusion. She has negative patellar apprehension test. She's negative Lachman's testing. She has no pain or laxity with varus or valgus stress testing to the left knee joint. She does have a positive abnormal  McMurray's test the left knee. She is no erythema to the skin or excessive warmth to the left knee. She has good active and passive range of motion to the left knee. She is no tenderness to the left calf or thigh.  Neurological: She is alert.       Mental status and motor strength appears baseline for patient and situation.  Skin: No rash noted.  Psychiatric: She has a normal mood and affect.    ED Course  Procedures (including critical care time) Radiology results reviewed, patient has been ambulatory for the last 2 months on this injured knee, therefore I do not think she needs emergent MRI today and the patient does not want the MRI today and prefers outpatient orthopedic followup.   Labs Reviewed - No data to display No results found.   1. Internal derangement of left knee       MDM    Patient / Family / Caregiver understand and agree with initial ED impression and plan with expectations set for ED visit.  Pt stable in ED with no significant deterioration in condition.  Patient / Family / Caregiver informed of clinical course, understand medical decision-making process, and agree with plan.  I doubt any other EMC precluding discharge at this time including, but not necessarily limited to the following:acute Fx.    Hurman Horn, MD 12/13/11 [phone]

## 2011-12-11 NOTE — ED Notes (Signed)
Pt c/o LT knee pain s/p fall 2 mos ago

## 2011-12-11 NOTE — Discharge Instructions (Signed)
Knee immobilizer can be used for weeks if needed to treat serious sprains, or minor fractures. Do not put objects under your splint to scratch yourself. Do not put weight on a splint unless told to do so. Call or return immediately if you have: Increased pain or pressure around the injury.  Numbness, tingling, painful, cool toes or fingers.  Swelling or inability to move fingers or toes.  Color change to fingers or toes (blue or gray).   Wear your knee immobilizer when walking.  You have a possible broken bone chip/break/fracture in your knee of your proximal tibia for the last 2 months and may need further imaging to determine if this is the case from orthopedics.

## 2013-08-23 HISTORY — PX: HIATAL HERNIA REPAIR: SHX195

## 2013-09-26 ENCOUNTER — Ambulatory Visit: Payer: Self-pay

## 2014-01-29 ENCOUNTER — Emergency Department (HOSPITAL_COMMUNITY): Payer: Medicaid Other

## 2014-01-29 ENCOUNTER — Encounter (HOSPITAL_COMMUNITY): Payer: Self-pay | Admitting: Emergency Medicine

## 2014-01-29 ENCOUNTER — Emergency Department (HOSPITAL_COMMUNITY)
Admission: EM | Admit: 2014-01-29 | Discharge: 2014-01-29 | Disposition: A | Discharge status: Home / Self Care | Payer: Medicaid Other | Attending: Emergency Medicine | Admitting: Emergency Medicine

## 2014-01-29 DIAGNOSIS — K5090 Crohn's disease, unspecified, without complications: Secondary | ICD-10-CM | POA: Diagnosis not present

## 2014-01-29 DIAGNOSIS — Z79899 Other long term (current) drug therapy: Secondary | ICD-10-CM | POA: Insufficient documentation

## 2014-01-29 DIAGNOSIS — IMO0002 Reserved for concepts with insufficient information to code with codable children: Secondary | ICD-10-CM | POA: Diagnosis not present

## 2014-01-29 DIAGNOSIS — F17200 Nicotine dependence, unspecified, uncomplicated: Secondary | ICD-10-CM | POA: Diagnosis not present

## 2014-01-29 DIAGNOSIS — D72829 Elevated white blood cell count, unspecified: Secondary | ICD-10-CM | POA: Insufficient documentation

## 2014-01-29 DIAGNOSIS — K449 Diaphragmatic hernia without obstruction or gangrene: Secondary | ICD-10-CM | POA: Diagnosis not present

## 2014-01-29 DIAGNOSIS — M25519 Pain in unspecified shoulder: Secondary | ICD-10-CM | POA: Insufficient documentation

## 2014-01-29 DIAGNOSIS — M62838 Other muscle spasm: Secondary | ICD-10-CM | POA: Insufficient documentation

## 2014-01-29 DIAGNOSIS — I10 Essential (primary) hypertension: Secondary | ICD-10-CM | POA: Diagnosis not present

## 2014-01-29 DIAGNOSIS — R209 Unspecified disturbances of skin sensation: Secondary | ICD-10-CM | POA: Diagnosis present

## 2014-01-29 LAB — I-STAT TROPONIN, ED: Troponin i, poc: 0 ng/mL (ref 0.00–0.08)

## 2014-01-29 LAB — COMPREHENSIVE METABOLIC PANEL
ALT: 16 U/L (ref 0–35)
AST: 17 U/L (ref 0–37)
Albumin: 4 g/dL (ref 3.5–5.2)
Alkaline Phosphatase: 65 U/L (ref 39–117)
BUN: 18 mg/dL (ref 6–23)
CO2: 21 mEq/L (ref 19–32)
Calcium: 10 mg/dL (ref 8.4–10.5)
Chloride: 101 mEq/L (ref 96–112)
Creatinine, Ser: 1.13 mg/dL — ABNORMAL HIGH (ref 0.50–1.10)
GFR calc Af Amer: 67 mL/min — ABNORMAL LOW (ref >90)
GFR calc non Af Amer: 58 mL/min — ABNORMAL LOW (ref >90)
Glucose, Bld: 128 mg/dL — ABNORMAL HIGH (ref 70–99)
Potassium: 3.8 mEq/L (ref 3.7–5.3)
Sodium: 140 mEq/L (ref 137–147)
Total Bilirubin: 0.4 mg/dL (ref 0.3–1.2)
Total Protein: 6.9 g/dL (ref 6.0–8.3)

## 2014-01-29 LAB — CBC
HCT: 49 % — ABNORMAL HIGH (ref 36.0–46.0)
Hemoglobin: 17.1 g/dL — ABNORMAL HIGH (ref 12.0–15.0)
MCH: 34.7 pg — ABNORMAL HIGH (ref 26.0–34.0)
MCHC: 34.9 g/dL (ref 30.0–36.0)
MCV: 99.4 fL (ref 78.0–100.0)
Platelets: 260 10*3/uL (ref 150–400)
RBC: 4.93 MIL/uL (ref 3.87–5.11)
RDW: 12.4 % (ref 11.5–15.5)
WBC: 10.6 10*3/uL — ABNORMAL HIGH (ref 4.0–10.5)

## 2014-01-29 MED ORDER — ESOMEPRAZOLE MAGNESIUM 40 MG PO CPDR: 40.0000 mg | DELAYED_RELEASE_CAPSULE | Freq: Every day | ORAL | Status: DC

## 2014-01-29 MED ORDER — ONDANSETRON HCL 4 MG PO TABS: 4.0000 mg | ORAL_TABLET | Freq: Three times a day (TID) | ORAL | Status: DC | PRN

## 2014-01-29 MED ORDER — LIDOCAINE HCL 2 % EX GEL
CUTANEOUS | Status: AC
  Filled 2014-01-29: qty 10

## 2014-01-29 MED ORDER — ASPIRIN 81 MG PO CHEW
324.0000 mg | CHEWABLE_TABLET | Freq: Once | ORAL | Status: AC
  Administered 2014-01-29: 324 mg via ORAL
  Filled 2014-01-29: qty 4

## 2014-01-29 MED ORDER — NITROGLYCERIN 0.4 MG SL SUBL
0.4000 mg | SUBLINGUAL_TABLET | SUBLINGUAL | Status: DC | PRN
  Filled 2014-01-29: qty 1

## 2014-01-29 MED ORDER — HYDROCHLOROTHIAZIDE 12.5 MG PO TABS: 12.5000 mg | ORAL_TABLET | Freq: Every day | ORAL | Status: DC

## 2014-01-29 MED ORDER — MORPHINE SULFATE 4 MG/ML IJ SOLN
4.0000 mg | Freq: Once | INTRAMUSCULAR | Status: AC
  Administered 2014-01-29: 4 mg via INTRAVENOUS
  Filled 2014-01-29: qty 1

## 2014-01-29 MED ORDER — ONDANSETRON 4 MG PO TBDP
4.0000 mg | ORAL_TABLET | Freq: Once | ORAL | Status: AC
  Administered 2014-01-29: 4 mg via ORAL
  Filled 2014-01-29: qty 1

## 2014-01-29 MED ORDER — HYDROMORPHONE HCL PF 1 MG/ML IJ SOLN
1.0000 mg | Freq: Once | INTRAMUSCULAR | Status: AC
  Administered 2014-01-29: 1 mg via INTRAVENOUS
  Filled 2014-01-29: qty 1

## 2014-01-29 NOTE — ED Notes (Signed)
Per pt, states she was at work when her left side went numb and having increasing pain-states she stopped chemo pills 6 months ago

## 2014-01-29 NOTE — Discharge Instructions (Signed)
Hiatal Hernia A hiatal hernia occurs when a part of the stomach slides above the diaphragm. The diaphragm is the thin muscle separating the belly (abdomen) from the chest. A hiatal hernia can be something you are born with or develop over time. Hiatal hernias may allow stomach acid to flow back into your esophagus, the tube which carries food from your mouth to your stomach. If this acid causes problems it is called GERD (gastro-esophageal reflux disease).  SYMPTOMS  Common symptoms of GERD are heartburn (burning in your chest). This is worse when lying down or bending over. It may also cause belching and indigestion. Some of the things which make GERD worse are:  Increased weight pushes on stomach making acid rise more easily.  Smoking markedly increases acid production.  Alcohol decreases lower esophageal sphincter pressure (valve between stomach and esophagus), allowing acid from stomach into esophagus.  Late evening meals and going to bed with a full stomach increases pressure.  Anything that causes an increase in acid production.  Lower esophageal sphincter incompetence. DIAGNOSIS  Hiatal hernia is often diagnosed with x-rays of your stomach and small bowel. This is called an UGI (upper gastrointestinal x-ray). Sometimes a gastroscopic procedure is done. This is a procedure where your caregiver uses a flexible instrument to look into the stomach and small bowel. HOME CARE INSTRUCTIONS   Try to achieve and maintain an ideal body weight.  Avoid drinking alcoholic beverages.  Stop smoking.  Put the head of your bed on 4 to 6 inch blocks. This will keep your head and esophagus higher than your stomach. If you cannot use blocks, sleep with several pillows under your head and shoulders.  Over-the-counter medications will decrease acid production. Your caregiver can also prescribe medications for this. Take as directed.  1/2 to 1 teaspoon of an antacid taken every hour while awake, with  meals and at bedtime, will neutralize acid.  Do not take aspirin, ibuprofen (Advil or Motrin), or other nonsteroidal anti-inflammatory drugs.  Do not wear tight clothing around your chest or stomach.  Eat smaller meals and eat more frequently. This keeps your stomach from getting too full. Eat slowly.  Do not lie down for 2 or 3 hours after eating. Do not eat or drink anything 1 to 2 hours before going to bed.  Avoid caffeine beverages (colas, coffee, cocoa, tea), fatty foods, citrus fruits and all other foods and drinks that contain acid and that seem to increase the problems.  Avoid bending over, especially after eating. Also avoid straining during bowel movements or when urinating or lifting things. Anything that increases the pressure in your belly increases the amount of acid that may be pushed up into your esophagus. SEEK IMMEDIATE MEDICAL CARE IF:  There is change in location (pain in arms, neck, jaw, teeth or back) of your pain, or the pain is getting worse.  You also experience nausea, vomiting, sweating (diaphoresis), or shortness of breath.  You develop continual vomiting, vomit blood or coffee ground material, have bright red blood in your stools, or have black tarry stools. Some of these symptoms could signal other problems such as heart disease. MAKE SURE YOU:   Understand these instructions.  Monitor your condition.  Contact your caregiver if you are not doing well or are getting worse. Document Released: 10/30/2003 Document Revised: 11/01/2011 Document Reviewed: 08/09/2005 [facility] Patient Information 2014 [place], [place].  Hypertension As your heart beats, it forces blood through your arteries. This force is your blood pressure. If the  pressure is too high, it is called hypertension (HTN) or high blood pressure. HTN is dangerous because you may have it and not know it. High blood pressure may mean that your heart has to work harder to pump blood. Your arteries may  be narrow or stiff. The extra work puts you at risk for heart disease, stroke, and other problems.  Blood pressure consists of two numbers, a higher number over a lower, 110/72, for example. It is stated as "110 over 72." The ideal is below 120 for the top number (systolic) and under 80 for the bottom (diastolic). Write down your blood pressure today. You should pay close attention to your blood pressure if you have certain conditions such as:  Heart failure.  Prior heart attack.  Diabetes  Chronic kidney disease.  Prior stroke.  Multiple risk factors for heart disease. To see if you have HTN, your blood pressure should be measured while you are seated with your arm held at the level of the heart. It should be measured at least twice. A one-time elevated blood pressure reading (especially in the Emergency Department) does not mean that you need treatment. There may be conditions in which the blood pressure is different between your right and left arms. It is important to see your caregiver soon for a recheck. Most people have essential hypertension which means that there is not a specific cause. This type of high blood pressure may be lowered by changing lifestyle factors such as:  Stress.  Smoking.  Lack of exercise.  Excessive weight.  Drug/tobacco/alcohol use.  Eating less salt. Most people do not have symptoms from high blood pressure until it has caused damage to the body. Effective treatment can often prevent, delay or reduce that damage. TREATMENT  When a cause has been identified, treatment for high blood pressure is directed at the cause. There are a large number of medications to treat HTN. These fall into several categories, and your caregiver will help you select the medicines that are best for you. Medications may have side effects. You should review side effects with your caregiver. If your blood pressure stays high after you have made lifestyle changes or started on  medicines,   Your medication(s) may need to be changed.  Other problems may need to be addressed.  Be certain you understand your prescriptions, and know how and when to take your medicine.  Be sure to follow up with your caregiver within the time frame advised (usually within two weeks) to have your blood pressure rechecked and to review your medications.  If you are taking more than one medicine to lower your blood pressure, make sure you know how and at what times they should be taken. Taking two medicines at the same time can result in blood pressure that is too low. SEEK IMMEDIATE MEDICAL CARE IF:  You develop a severe headache, blurred or changing vision, or confusion.  You have unusual weakness or numbness, or a faint feeling.  You have severe chest or abdominal pain, vomiting, or breathing problems. MAKE SURE YOU:   Understand these instructions.  Will watch your condition.  Will get help right away if you are not doing well or get worse. Document Released: 08/09/2005 Document Revised: 11/01/2011 Document Reviewed: 03/29/2008 [facility] Patient Information 2014 [place], [place].

## 2014-01-29 NOTE — Progress Notes (Signed)
[facility] provided pt with a list of primary care resources to help patient establish pcp.  °

## 2014-01-29 NOTE — ED Provider Notes (Signed)
CSN: [id number]     Arrival date & time 01/29/14  1005 History   First MD Initiated Contact with Patient 01/29/14 1025     Chief Complaint  Patient presents with  . left sided numbness      (Consider location/radiation/quality/duration/timing/severity/associated sxs/prior Treatment) HPI  Katherine Saunders is a 43 year old female who presents to the emergency department with acute onset left shoulder pain and numbness. Past medical history of Crohn's disease and was on several years of azathioprine use. Patient has been unable to afford her medications for the past 2 years. The patient also has a 30-pack-year smoking history.  Patient states that she was standing at work when she suddenly began having severe pain in her left shoulder. She states it is achy, on the left side behind her shoulder blade. She also had associated numbness and tingling in the left arm. She states it has been progressively worsening. She also complains of pain and tightness under her left breast. She denies any weakness in her leg or arm, facial droop, difficulty with speech or swallowing, visual changes or headache. The patient has no family history of heart attack, she denies a history of diabetes, hypertension, hyperlipidemia. She states she has no previous history of shoulder or arm pain prior to this morning.  Past Medical History  Diagnosis Date  . Crohn's disease    Past Surgical History  Procedure Laterality Date  . Tubal ligation     No family history on file. History  Substance Use Topics  . Smoking status: Current Every Day Smoker  . Smokeless tobacco: Not on file  . Alcohol Use: No   OB History   Grav Para Term Preterm Abortions TAB SAB Ect Mult Living                 Review of Systems  Ten systems reviewed and are negative for acute change, except as noted in the HPI.    Allergies  Review of patient's allergies indicates no known allergies.  Home Medications   Prior to Admission medications    Medication Sig Start Date End Date Taking? Authorizing Provider  predniSONE (DELTASONE) 20 MG tablet Take 40 mg by mouth daily with breakfast.   Yes Historical Provider, MD  azaTHIOprine (IMURAN) 50 MG tablet Take 250 mg by mouth every morning. She was taking for her Chron's Disease.    Historical Provider, MD   BP 147/98  Pulse 84  Temp(Src) 98 F (36.7 C) (Oral)  Resp 18  SpO2 98%  LMP 01/22/2014 Physical Exam  Nursing note and vitals reviewed. Constitutional: She is oriented to person, place, and time. She appears well-developed and well-nourished. No distress.  HENT:  Head: Normocephalic and atraumatic.  Eyes: Conjunctivae normal and EOM are normal. Pupils are equal, round, and reactive to light. No scleral icterus.  Neck: Normal range of motion.  Cardiovascular: Normal rate, regular rhythm and normal heart sounds.  Exam reveals no gallop and no friction rub.   No murmur heard. Pulmonary/Chest: Effort normal and breath sounds normal. No respiratory distress.  Abdominal: Soft. Bowel sounds are normal. She exhibits no distension and no mass. There is no tenderness. There is no guarding.  Musculoskeletal: Pain in the Left trapezius muscle. Neurological: She is alert and oriented to person, place, and time.  Speech is clear and goal oriented, follows commands Major Cranial nerves without deficit, no facial droop Strength decreased in the Left Deltoid. Moves extremities without ataxia, coordination intact Normal finger to nose and rapid alternating  movements no pronator drift Skin: Skin is warm and dry. She is not diaphoretic.    ED Course  NERVE BLOCK Date/Time: 02/02/2014 2:08 PM Performed by: Arthor CaptainHARRIS, Latwan Luchsinger Authorized by: Arthor CaptainHARRIS, [place] Consent: Verbal consent obtained. Risks and benefits: risks, benefits and alternatives were discussed Consent given by: patient Patient identity confirmed: verbally with patient Time out: Immediately prior to procedure a "time out" was  called to verify the correct patient, procedure, equipment, support staff and site/side marked as required. Indications: pain relief Body area: trunk (Left Trapzius block) Patient position: supine Needle gauge: 27 G Location technique: anatomical landmarks Local anesthetic: lidocaine 1% with epinephrine Anesthetic total: 10 ml Outcome: pain improved Comments: Pain improved. Radicular sxs resolved.  NG placement Date/Time: 02/02/2014 2:10 PM Performed by: Daryel GeraldAVIS, NATALIE H Authorized by: Arthor CaptainHARRIS, Schyler Counsell Consent: Verbal consent obtained. Patient identity confirmed: verbally with patient Time out: Immediately prior to procedure a "time out" was called to verify the correct patient, procedure, equipment, support staff and site/side marked as required. Preparation: Patient was prepped and draped in the usual sterile fashion. Local anesthesia used: no Patient sedated: no Patient tolerance: Patient tolerated the procedure well with no immediate complications. Comments: Patient with great relief of sxs and chest pressure   (including critical care time) Labs Review Labs Reviewed  CBC  COMPREHENSIVE METABOLIC PANEL  I-STAT TROPOININ, ED    Imaging Review No results found.   EKG Interpretation   Date/Time:  Tuesday January 29 2014 10:33:37 EDT Ventricular Rate:  55 PR Interval:  103 QRS Duration: 83 QT Interval:  457 QTC Calculation: 437 R Axis:   41 Text Interpretation:  Sinus rhythm Short PR interval Probable anteroseptal  infarct, old Confirmed by HORTON  MD, COURTNEY ([id number]) on 01/29/2014  10:39:09 AM      MDM   Final diagnoses:  Hiatal hernia  Trapezius muscle spasm  Hypertension   11:15 AM BP 147/98  Pulse 84  Temp(Src) 98 F (36.7 C) (Oral)  Resp 18  SpO2 98%  LMP 01/22/2014 Patient seen in shared visit with Dr. Wilkie AyeHorton.  Concern for ACS, radicuplopathy. Doubt central cause of neurologic sxs.  EKG without signs of acute ischemia. Labs and imaging  pending.      Patient with slight  Leukocytosis and hyperglycemia. Likely acute phase reactants, Negative troponin. CXR shows large hiatal hernia which is likely the source of her chest sxs and vomiting. NG tube place with sig. RELIEF of sxs and large amount of frothy pink fluid removed.  she continues to have sever reproducible pain and weakness in the left shoulder. I have performed a trap block and patient's pain resolved with return of strength.  i dicussed lab findings with the patient includign the favt htat her serum creatininge s elevated and will need a repeat test. He may come here or to an urgent care as she is uninsured and will need follow up on this lab abnomality. i have discussed the case ewith r. Horton. We agree that she does not need a repeat troponin.  patient is pain free at this time. \d/c with pepcid. .  Patient is to be discharged with recommendation to follow up with PCP in regards to today's hospital visit. Chest pain is not likely of cardiac or pulmonary etiology d/t presentation, perc negative, VSS, no tracheal deviation, no JVD or new murmur, RRR, breath sounds equal bilaterally, EKG without acute abnormalities, negative troponin, and negative CXR. Pt has been advised start a PPI and return to the ED is CP  becomes exertional, associated with diaphoresis or nausea, radiates to left jaw/arm, worsens or becomes concerning in any way. Pt appears reliable for follow up and is agreeable to discharge.   Case has been discussed with and seen by Dr. Wilkie Aye. who agrees with the above plan to discharge.       Arthor Captain, PA-C 02/02/14 1415  Arthor Captain, PA-C 02/03/14 1630

## 2014-01-29 NOTE — ED Notes (Signed)
Bed: WA07 Expected date:  Expected time:  Means of arrival:  Comments: 

## 2014-02-04 NOTE — ED Provider Notes (Signed)
Medical screening examination/treatment/procedure(s) were performed by non-physician practitioner and as supervising physician I was immediately available for consultation/collaboration.   EKG Interpretation   Date/Time:  Tuesday January 29 2014 10:33:37 EDT Ventricular Rate:  55 PR Interval:  103 QRS Duration: 83 QT Interval:  457 QTC Calculation: 437 R Axis:   41 Text Interpretation:  Sinus rhythm Short PR interval Probable anteroseptal  infarct, old Confirmed by Jensine Luz  MD, Cherese Lozano ([id number]) on 01/29/2014  10:39:09 AM        Shon Batonourtney F Norvin Ohlin, MD 02/04/14 1447

## 2015-08-20 ENCOUNTER — Encounter (HOSPITAL_BASED_OUTPATIENT_CLINIC_OR_DEPARTMENT_OTHER): Payer: Self-pay | Admitting: Emergency Medicine

## 2015-08-20 ENCOUNTER — Emergency Department (HOSPITAL_BASED_OUTPATIENT_CLINIC_OR_DEPARTMENT_OTHER)
Admission: EM | Admit: 2015-08-20 | Discharge: 2015-08-20 | Disposition: A | Discharge status: Home / Self Care | Payer: BLUE CROSS/BLUE SHIELD | Attending: Emergency Medicine | Admitting: Emergency Medicine

## 2015-08-20 ENCOUNTER — Emergency Department (HOSPITAL_BASED_OUTPATIENT_CLINIC_OR_DEPARTMENT_OTHER): Payer: BLUE CROSS/BLUE SHIELD

## 2015-08-20 DIAGNOSIS — Z79899 Other long term (current) drug therapy: Secondary | ICD-10-CM | POA: Diagnosis not present

## 2015-08-20 DIAGNOSIS — Y9389 Activity, other specified: Secondary | ICD-10-CM | POA: Diagnosis not present

## 2015-08-20 DIAGNOSIS — M25462 Effusion, left knee: Secondary | ICD-10-CM

## 2015-08-20 DIAGNOSIS — Y990 Civilian activity done for income or pay: Secondary | ICD-10-CM | POA: Diagnosis not present

## 2015-08-20 DIAGNOSIS — S8992XA Unspecified injury of left lower leg, initial encounter: Secondary | ICD-10-CM | POA: Insufficient documentation

## 2015-08-20 DIAGNOSIS — Y9289 Other specified places as the place of occurrence of the external cause: Secondary | ICD-10-CM | POA: Insufficient documentation

## 2015-08-20 DIAGNOSIS — F17200 Nicotine dependence, unspecified, uncomplicated: Secondary | ICD-10-CM | POA: Insufficient documentation

## 2015-08-20 DIAGNOSIS — Z8719 Personal history of other diseases of the digestive system: Secondary | ICD-10-CM | POA: Insufficient documentation

## 2015-08-20 DIAGNOSIS — W500XXA Accidental hit or strike by another person, initial encounter: Secondary | ICD-10-CM | POA: Insufficient documentation

## 2015-08-20 MED ORDER — OXYCODONE-ACETAMINOPHEN 5-325 MG PO TABS: 1.0000 | ORAL_TABLET | ORAL | Status: DC | PRN

## 2015-08-20 NOTE — Discharge Instructions (Signed)
Read the information below.  Use the prescribed medication as directed.  Please discuss all new medications with your pharmacist.  Do not take additional tylenol while taking the prescribed pain medication to avoid overdose.  You may return to the Emergency Department at any time for worsening condition or any new symptoms that concern you.    If you develop uncontrolled pain, weakness or numbness of the extremity, severe discoloration of the skin, or you are unable to walk or move your knee, ankle, or toes, return to the ER for a recheck.      Knee Effusion Knee effusion means that you have excess fluid in your knee joint. This can cause pain and swelling in your knee. This may make your knee more difficult to bend and move. That is because there is increased pain and pressure in the joint. If there is fluid in your knee, it often means that something is wrong inside your knee, such as severe arthritis, abnormal inflammation, or an infection. Another common cause of knee effusion is an injury to the knee muscles, ligaments, or cartilage. HOME CARE INSTRUCTIONS  Use crutches as directed by your health care provider.  Wear a knee brace as directed by your health care provider.  Apply ice to the swollen area:  Put ice in a plastic bag.  Place a towel between your skin and the bag.  Leave the ice on for 20 minutes, 2-3 times per day.  Keep your knee raised (elevated) when you are sitting or lying down.  Take medicines only as directed by your health care provider.  Do any rehabilitation or strengthening exercises as directed by your health care provider.  Rest your knee as directed by your health care provider. You may start doing your normal activities again when your health care provider approves.   Keep all follow-up visits as directed by your health care provider. This is important. SEEK MEDICAL CARE IF:  You have ongoing (persistent) pain in your knee. SEEK IMMEDIATE MEDICAL CARE  IF:  You have increased swelling or redness of your knee.  You have severe pain in your knee.  You have a fever.   This information is not intended to replace advice given to you by your health care provider. Make sure you discuss any questions you have with your health care provider.   Document Released: 10/30/2003 Document Revised: 08/30/2014 Document Reviewed: 03/25/2014 Elsevier Interactive Patient Education [employer]

## 2015-08-20 NOTE — ED Notes (Signed)
Pt reports falling at work on Sunday and injured left knee

## 2015-08-20 NOTE — ED Notes (Signed)
Patient transported to xray via wheelchair.

## 2015-08-20 NOTE — ED Provider Notes (Signed)
CSN: [id number]     Arrival date & time 08/20/15  1210 History   First MD Initiated Contact with Patient 08/20/15 1320     Chief Complaint  Patient presents with  . Knee Pain     (Consider location/radiation/quality/duration/timing/severity/associated sxs/prior Treatment) HPI   Pt presents with left knee pain and injury that occurred 3 days ago.  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  Felt something was poking out of the side of her knee immediately afterwards that disappeared when she straightened her leg.  She has had large amount of swelling that has also improved.  Has had severe pain in the lateral and posterior aspect of her left knee since then.  Has tried taking vicodin (leftover from dental surgery) without improvement.  Denies falling to the ground, hitting head, other injury.  Denies weakness or numbness of the leg.    Past Medical History  Diagnosis Date  . Crohn's disease [facility])    Past Surgical History  Procedure Laterality Date  . Tubal ligation     History reviewed. No pertinent family history. Social History  Substance Use Topics  . Smoking status: Current Every Day Smoker  . Smokeless tobacco: None  . Alcohol Use: No   OB History    No data available     Review of Systems  Constitutional: Negative for fever.  Cardiovascular: Negative for chest pain.  Gastrointestinal: Negative for abdominal pain.  Musculoskeletal: Positive for arthralgias.  Skin: Negative for color change and wound.  Allergic/Immunologic: Negative for immunocompromised state.  Neurological: Negative for syncope, weakness, numbness and headaches.  Psychiatric/Behavioral: Negative for self-injury.      Allergies  Review of patient's allergies indicates no known allergies.  Home Medications   Prior to Admission medications   Medication Sig Start Date End Date Taking? Authorizing Provider  amLODipine (NORVASC) 10 MG  tablet Take 10 mg by mouth daily.   Yes Historical Provider, MD  oxyCODONE-acetaminophen (PERCOCET/ROXICET) 5-325 MG tablet Take 1-2 tablets by mouth every 4 (four) hours as needed for moderate pain or severe pain. 08/20/15   Trixie Dredge, PA-C   BP 151/91 mmHg  Pulse 79  Temp(Src) 98 F (36.7 C) (Oral)  Resp 20  Ht  (1.702 m)  Wt 81.647 kg  BMI 28.19 kg/m2  SpO2 100%  LMP 07/30/2015 Physical Exam  Constitutional: She appears well-developed and well-nourished. No distress.  HENT:  Head: Normocephalic and atraumatic.  Eyes: Conjunctivae are normal.  Neck: Neck supple.  Cardiovascular: Normal rate.   Pulmonary/Chest: Effort normal.  Musculoskeletal:       Left knee: She exhibits swelling and effusion. She exhibits normal range of motion, no ecchymosis, no deformity, no laceration, no erythema and normal alignment. Tenderness found.       Legs: Pain with valgus and varus stress without laxity.  No pain with anterior and posterior drawer.   Left lower extremity pulses intact, full AROM, sensation intact, compartments soft.    Neurological: She is alert.  Skin: She is not diaphoretic.  Nursing note and vitals reviewed.   ED Course  Procedures (including critical care time) Labs Review Labs Reviewed - No data to display  Imaging Review Dg Knee Complete 4 Views Left  08/20/2015  CLINICAL DATA:  Left knee pain and swelling since transient lateral patellar dislocation on 08/16/2015. EXAM: LEFT KNEE - COMPLETE 4+ VIEW COMPARISON:  12/11/2011 FINDINGS: There is a large joint effusion. There  is dystrophic chronic calcification in the medial patellofemoral ligament consistent with remote prior injury. Small marginal osteophytes on the medial and lateral tibial plateaus. No fractures. IMPRESSION: Large joint effusion. No acute osseous abnormality. Evidence of remote injury of the medial patellofemoral ligament as described above. Electronically Signed   By: Francene Boyers M.D.   On:  08/20/2015 12:58   I have personally reviewed and evaluated these images and lab results as part of my medical decision-making.   EKG Interpretation None      MDM   Final diagnoses:  Left knee injury, initial encounter  Knee effusion, left    Afebrile, nontoxic patient with injury to her left knee when someone fell against it 3 days ago.   Xray demonstrates large effusion without bony abnormality.  Suspect ligamentous or meniscal injury.  Neurovascularly intact.  Doubt vascularly injury.  No e/o compartment syndrome.   D/C home with percocet, knee immobilizer, crutches, orthopedic follow up.   Discussed result, findings, treatment, and follow up  with patient.  Pt given return precautions.  Pt verbalizes understanding and agrees with plan.        Trixie Dredge, PA-C 08/20/15 1403  Jerelyn Scott, MD 08/20/15 1455

## 2015-09-05 ENCOUNTER — Other Ambulatory Visit: Payer: Self-pay | Admitting: Sports Medicine

## 2015-09-05 DIAGNOSIS — M25562 Pain in left knee: Secondary | ICD-10-CM

## 2015-09-10 ENCOUNTER — Ambulatory Visit
Admission: RE | Admit: 2015-09-10 | Discharge: 2015-09-10 | Disposition: A | Discharge status: Home / Self Care | Payer: BLUE CROSS/BLUE SHIELD | Source: Ambulatory Visit | Attending: Sports Medicine | Admitting: Sports Medicine

## 2015-09-10 DIAGNOSIS — M25562 Pain in left knee: Secondary | ICD-10-CM

## 2016-03-01 ENCOUNTER — Encounter: Payer: Self-pay | Admitting: Family Medicine

## 2016-03-01 ENCOUNTER — Ambulatory Visit (HOSPITAL_BASED_OUTPATIENT_CLINIC_OR_DEPARTMENT_OTHER)
Admission: RE | Admit: 2016-03-01 | Discharge: 2016-03-01 | Disposition: A | Discharge status: Home / Self Care | Payer: BLUE CROSS/BLUE SHIELD | Source: Ambulatory Visit | Attending: Family Medicine | Admitting: Family Medicine

## 2016-03-01 ENCOUNTER — Ambulatory Visit (INDEPENDENT_AMBULATORY_CARE_PROVIDER_SITE_OTHER): Payer: BLUE CROSS/BLUE SHIELD | Admitting: Family Medicine

## 2016-03-01 VITALS — BP 148/104 | HR 73 | Ht 66.0 in | Wt 195.0 lb

## 2016-03-01 DIAGNOSIS — M25562 Pain in left knee: Secondary | ICD-10-CM

## 2016-03-01 DIAGNOSIS — M85862 Other specified disorders of bone density and structure, left lower leg: Secondary | ICD-10-CM | POA: Diagnosis not present

## 2016-03-01 DIAGNOSIS — M25462 Effusion, left knee: Secondary | ICD-10-CM | POA: Insufficient documentation

## 2016-03-01 DIAGNOSIS — S8992XA Unspecified injury of left lower leg, initial encounter: Secondary | ICD-10-CM

## 2016-03-01 MED ORDER — DICLOFENAC SODIUM 75 MG PO TBEC: 75.0000 mg | DELAYED_RELEASE_TABLET | Freq: Two times a day (BID) | ORAL | Status: DC

## 2016-03-01 MED ORDER — METHYLPREDNISOLONE ACETATE 40 MG/ML IJ SUSP
40.0000 mg | Freq: Once | INTRAMUSCULAR | Status: AC
  Administered 2016-03-01: 40 mg via INTRA_ARTICULAR

## 2016-03-01 MED ORDER — OXYCODONE-ACETAMINOPHEN 5-325 MG PO TABS: 1.0000 | ORAL_TABLET | ORAL | Status: DC | PRN

## 2016-03-01 MED FILL — OXYCODONE/APAP 5-325: 5-325 | 4 days supply | Qty: 40 | Fill #0

## 2016-03-01 MED FILL — DICLOFENAC SOD EC 75 MG TAB: 75 | 30 days supply | Qty: 60 | Fill #0

## 2016-03-01 NOTE — Patient Instructions (Signed)
You have a knee contusion, traumatic synovitis. Icing 15 minutes at a time 3-4 times a day. Elevate above your heart as much as possible Compression sleeve OR ACE wrap for support and to help with the swelling. It's not unusual for this to come back some even after the aspiration and injection we did. Diclofenac twice a day with food for pain and inflammation. Oxycodone as needed for severe pain. Straight leg raises, knee extensions 3 sets of 10 once a day to maintain quad strength. Follow up with me in 2 weeks.

## 2016-03-03 DIAGNOSIS — S8992XA Unspecified injury of left lower leg, initial encounter: Secondary | ICD-10-CM | POA: Insufficient documentation

## 2016-03-03 NOTE — Progress Notes (Signed)
PCP: No primary care provider on file.  Subjective:   HPI: Patient is a 45 y.o. female here for left knee injury.  Patient reports on 7[place]4 [place] the [place] she [place] wrong coming down the steps and jammed her left knee. She has previous problems with this knee dislocating in past (seen end of last year [place] office for this) but this injury was different. Immediate pain, swelling.  Radiographs negative for acute fracture. Pain worse at nighttime. Has been wearing immobilizer. Pain level 8/10 and sharp. Pain with all motions of knee and ambulation. No catching, locking.  Past Medical History  Diagnosis Date  . Crohn's disease [facility])     Current Outpatient Prescriptions on File Prior to Visit  Medication Sig Dispense Refill  . amLODipine (NORVASC) 10 MG tablet Take 10 mg by mouth daily.     No current facility-administered medications on file prior to visit.    Past Surgical History  Procedure Laterality Date  . Tubal ligation      No Known Allergies  Social History   Social History  . Marital Status: Single    Spouse Name: N/A  . Number of Children: N/A  . Years of Education: N/A   Occupational History  . Not on file.   Social History Main Topics  . Smoking status: Current Every Day Smoker  . Smokeless tobacco: Not on file  . Alcohol Use: No  . Drug Use: No  . Sexual Activity: Not on file   Other Topics Concern  . Not on file   Social History Narrative    No family history on file.  BP 148/104 mmHg  Pulse 73  Ht  (1.676 m)  Wt 195 lb (88.451 kg)  BMI 31.49 kg/m2  LMP 02/22/2016  Review of Systems: See HPI above.    Objective:  Physical Exam:  Gen: NAD, comfortable in exam room  Left knee: Large effusion.  No gross deformity, ecchymoses. Diffuse tenderness. ROM 0 - 70 degrees, slow and painful. Guarding, unable to perform ant/post drawers.  Lachmanns negative.  Negative valgus/varus testing. Negative lachmanns. Negative  patellar apprehension. NV intact distally.    Right knee: FROM without pain.  Assessment & Plan:  1. Left knee injury - independently reviewed today's radiographs.  Do not see a tibial plateau fracture, other acute bony findings.  History and exam consistent with knee contusion, traumatic synovitis.  Icing, compression, elevation.  Shown some home exercises to do daily.  Diclofenac with oxycodone as needed.  Aspiration and injection done as well.  After informed written consent patient was lying supine on exam table.  Left knee was prepped with alcohol swab.  Utilizing superolateral approach with ultrasound guidance, 3 mL of marcaine was used for local anesthesia.  Then using an 18g needle on two 60cc syringes, 95 mL of clear straw-colored fluid was aspirated from left knee.  Knee was then injected with 3:1 marcaine:depomedrol.  Patient tolerated procedure well without immediate complications

## 2016-03-03 NOTE — Assessment & Plan Note (Signed)
independently reviewed today's radiographs.  Do not see a tibial plateau fracture, other acute bony findings.  History and exam consistent with knee contusion, traumatic synovitis.  Icing, compression, elevation.  Shown some home exercises to do daily.  Diclofenac with oxycodone as needed.  Aspiration and injection done as well.  After informed written consent patient was lying supine on exam table.  Left knee was prepped with alcohol swab.  Utilizing superolateral approach with ultrasound guidance, 3 mL of marcaine was used for local anesthesia.  Then using an 18g needle on two 60cc syringes, 95 mL of clear straw-colored fluid was aspirated from left knee.  Knee was then injected with 3:1 marcaine:depomedrol.  Patient tolerated procedure well without immediate complications

## 2016-03-15 ENCOUNTER — Encounter: Payer: Self-pay | Admitting: Family Medicine

## 2016-03-15 ENCOUNTER — Ambulatory Visit (INDEPENDENT_AMBULATORY_CARE_PROVIDER_SITE_OTHER): Payer: BLUE CROSS/BLUE SHIELD | Admitting: Family Medicine

## 2016-03-15 DIAGNOSIS — S8992XD Unspecified injury of left lower leg, subsequent encounter: Secondary | ICD-10-CM | POA: Diagnosis not present

## 2016-03-15 MED ORDER — TRAMADOL HCL 50 MG PO TABS: 50.0000 mg | ORAL_TABLET | Freq: Three times a day (TID) | ORAL | 0 refills | Status: DC | PRN

## 2016-03-15 NOTE — Patient Instructions (Signed)
You have a knee contusion, traumatic synovitis. Icing 15 minutes at a time 3-4 times a day. Elevate above your heart as much as possible Knee brace or compression for support and to help with the swelling when up and walking around. Diclofenac twice a day with food for pain and inflammation. Tramadol as needed for severe pain (no driving on this. Straight leg raises, knee extensions 3 sets of 10 once a day to maintain quad strength. Let us know if you want to do physical therapy - generally this is a good idea and will make your recovery faster. Follow up with me in 4 weeks.

## 2016-03-17 NOTE — Progress Notes (Signed)
PCP: No primary care provider on file.  Subjective:   HPI: Patient is a 46 y.o. female here for left knee injury.  7/10: Patient reports on 7[place]4 she was [place] the beach when she stepped wrong coming down the steps and jammed her left knee. She has previous problems with this knee dislocating in past (seen end of last year [place] office for this) but this injury was different. Immediate pain, swelling.  Radiographs negative for acute fracture. Pain worse at nighttime. Has been wearing immobilizer. Pain level 8/10 and sharp. Pain with all motions of knee and ambulation. No catching, locking.  7/24: Patient reports she feels much better. Swelling has improved. Pain down to 4/10, still sharp anteriorly. Doing home exercises, resting, ACE wrap, icing. Worse with walking, motions. No skin changes, numbness.  Past Medical History:  Diagnosis Date  . Crohn's disease [facility])     Current Outpatient Prescriptions on File Prior to Visit  Medication Sig Dispense Refill  . amLODipine (NORVASC) 10 MG tablet Take 10 mg by mouth daily.    . diclofenac (VOLTAREN) 75 MG EC tablet Take 1 tablet (75 mg total) by mouth 2 (two) times daily. 60 tablet 1   No current facility-administered medications on file prior to visit.     Past Surgical History:  Procedure Laterality Date  . TUBAL LIGATION      No Known Allergies  Social History   Social History  . Marital status: Single    Spouse name: N/A  . Number of children: N/A  . Years of education: N/A   Occupational History  . Not on file.   Social History Main Topics  . Smoking status: Current Every Day Smoker  . Smokeless tobacco: Never Used  . Alcohol use No  . Drug use: No  . Sexual activity: Not on file   Other Topics Concern  . Not on file   Social History Narrative  . No narrative on file    No family history on file.  BP (!) 123/91 (BP Location: Left Arm)   Pulse 74   Ht 5\' 6"  (1.676 m)   Wt 190 lb (86.2 kg)    LMP 02/22/2016   BMI 30.67 kg/m   Review of Systems: See HPI above.    Objective:  Physical Exam:  Gen: NAD, comfortable in exam room  Left knee: Small effusion.  No gross deformity, ecchymoses. Diffuse tenderness. ROM 0 - 90 degrees. Negative ant drawer, post drawer. Lachmanns negative.  Negative valgus/varus testing.  Negative patellar apprehension. NV intact distally.    Right knee: FROM without pain.  Assessment & Plan:  1. Left knee injury - No fracture on radiographs.  S/p aspiration and injection of 51mL fluid - clinically improving.  Continue with icing, diclofenac, knee brace or compression.  Tramadol as needed for severe pain.  Continue home exercises.  F/u in 4 weeks.  Consider physical therapy.

## 2016-03-17 NOTE — Assessment & Plan Note (Signed)
No fracture on radiographs.  S/p aspiration and injection of 75mL fluid - clinically improving.  Continue with icing, diclofenac, knee brace or compression.  Tramadol as needed for severe pain.  Continue home exercises.  F/u in 4 weeks.  Consider physical therapy.

## 2016-04-12 ENCOUNTER — Ambulatory Visit: Payer: BLUE CROSS/BLUE SHIELD | Admitting: Family Medicine

## 2018-01-05 ENCOUNTER — Ambulatory Visit: Payer: Self-pay

## 2018-01-05 ENCOUNTER — Ambulatory Visit (INDEPENDENT_AMBULATORY_CARE_PROVIDER_SITE_OTHER): Payer: BLUE CROSS/BLUE SHIELD | Admitting: Sports Medicine

## 2018-01-05 ENCOUNTER — Encounter: Payer: Self-pay | Admitting: Sports Medicine

## 2018-01-05 VITALS — BP 164/118 | HR 92 | Ht 66.0 in | Wt 205.4 lb

## 2018-01-05 DIAGNOSIS — M25562 Pain in left knee: Secondary | ICD-10-CM | POA: Diagnosis not present

## 2018-01-05 DIAGNOSIS — M25462 Effusion, left knee: Secondary | ICD-10-CM

## 2018-01-05 NOTE — Procedures (Signed)
PROCEDURE NOTE:  Ultrasound Guided: Aspiration and Injection: Left knee Images were obtained and interpreted by myself, Gaspar Bidding, DO  Images have been saved and stored to PACS system. Images obtained on: GE S7 Ultrasound machine    ULTRASOUND FINDINGS:  small effusion. Generalized thickening of the synovium  DESCRIPTION OF PROCEDURE:  The patient's clinical condition is marked by substantial pain and/or significant functional disability. Other conservative therapy has not provided relief, is contraindicated, or not appropriate. There is a reasonable likelihood that injection will significantly improve the patient's pain and/or functional impairment.   After discussing the risks, benefits and expected outcomes of the injection and all questions were reviewed and answered, the patient wished to undergo the above named procedure.  Verbal consent was obtained.  The ultrasound was used to identify the target structure and adjacent neurovascular structures. The skin was then prepped in sterile fashion and the target structure was injected under direct visualization using sterile technique as below:  PREP: Alcohol, Ethel Chloride and 5 cc 1% lidocaine on 25g 1.5 in. needle APPROACH: superiolateral, stopcock technique, 18g 3.5 in. INJECTATE: 2 cc 0.5% Marcaine and 2 cc 40mg /mL DepoMedrol ASPIRATE: 25cc clear, thin serous fluid DRESSING: Band-Aid and Body Helix Full Knee Compression Sleeve  Post procedural instructions including recommending icing and warning signs for infection were reviewed.    This procedure was well tolerated and there were no complications.   IMPRESSION: Succesful Ultrasound Guided: Aspiration and Injection

## 2018-01-05 NOTE — Patient Instructions (Addendum)
You had an injection today.  Things to be aware of after injection are listed below: . You may experience no significant improvement or even a slight worsening in your symptoms during the first 24 to 48 hours.  After that we expect your symptoms to improve gradually over the next 2 weeks for the medicine to have its maximal effect.  You should continue to have improvement out to 6 weeks after your injection. . Dr. Rigby recommends icing the site of the injection for 20 minutes  1-2 times the day of your injection . You may shower but no swimming, tub bath or Jacuzzi for 24 hours. . If your bandage falls off this does not need to be replaced.  It is appropriate to remove the bandage after 4 hours. . You may resume light activities as tolerated unless otherwise directed per Dr. Rigby during your visit  POSSIBLE STEROID SIDE EFFECTS:  Side effects from injectable steroids tend to be less than when taken orally however you may experience some of the symptoms listed below.  If experienced these should only last for a short period of time. Change in menstrual flow  Edema (swelling)  Increased appetite Skin flushing (redness)  Skin rash/acne  Thrush (oral) Yeast vaginitis    Increased sweating  Depression Increased blood glucose levels Cramping and leg/calf  Euphoria (feeling happy)  POSSIBLE PROCEDURE SIDE EFFECTS: The side effects of the injection are usually fairly minimal however if you may experience some of the following side effects that are usually self-limited and will is off on their own.  If you are concerned please feel free to call the office with questions:  Increased numbness or tingling  Nausea or vomiting  Swelling or bruising at the injection site   Please call our office if if you experience any of the following symptoms over the next week as these can be signs of infection:   Fever greater than 100.5F  Significant swelling at the injection site  Significant redness or drainage  from the injection site  If after 2 weeks you are continuing to have worsening symptoms please call our office to discuss what the next appropriate actions should be including the potential for a return office visit or other diagnostic testing.   I recommend you obtained a compression sleeve to help with your joint problems. There are many options on the market however I recommend obtaining a knee Body Helix compression sleeve.  You can find information (including how to appropriate measure yourself for sizing) can be found at www.Body Helix.com.  Many of these products are health savings account (HSA) eligible.   You can use the compression sleeve at any time throughout the day but is most important to use while being active as well as for 2 hours post-activity.   It is appropriate to ice following activity with the compression sleeve in place.    
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
## 2018-01-11 ENCOUNTER — Encounter: Payer: Self-pay | Admitting: Family Medicine

## 2018-01-11 ENCOUNTER — Ambulatory Visit (INDEPENDENT_AMBULATORY_CARE_PROVIDER_SITE_OTHER): Payer: BLUE CROSS/BLUE SHIELD | Admitting: Family Medicine

## 2018-01-11 VITALS — BP 148/88 | HR 71 | Temp 97.9°F | Ht 66.0 in | Wt 200.2 lb

## 2018-01-11 DIAGNOSIS — I10 Essential (primary) hypertension: Secondary | ICD-10-CM

## 2018-01-11 DIAGNOSIS — Z23 Encounter for immunization: Secondary | ICD-10-CM

## 2018-01-11 DIAGNOSIS — M25562 Pain in left knee: Secondary | ICD-10-CM

## 2018-01-11 DIAGNOSIS — Z5181 Encounter for therapeutic drug level monitoring: Secondary | ICD-10-CM | POA: Diagnosis not present

## 2018-01-11 DIAGNOSIS — Z114 Encounter for screening for human immunodeficiency virus [HIV]: Secondary | ICD-10-CM | POA: Diagnosis not present

## 2018-01-11 DIAGNOSIS — G8929 Other chronic pain: Secondary | ICD-10-CM

## 2018-01-11 MED ORDER — LISINOPRIL 20 MG PO TABS: 20.0000 mg | ORAL_TABLET | Freq: Every day | ORAL | 0 refills | Status: DC

## 2018-01-11 MED ORDER — MELOXICAM 15 MG PO TABS: 15.0000 mg | ORAL_TABLET | Freq: Every day | ORAL | 3 refills | Status: AC

## 2018-01-11 MED ORDER — ACETAMINOPHEN-CODEINE #3 300-30 MG PO TABS: 1.0000 | ORAL_TABLET | Freq: Two times a day (BID) | ORAL | 0 refills | Status: DC

## 2018-01-11 MED ORDER — ACETAMINOPHEN-CODEINE #3 300-30 MG PO TABS: 1.0000 | ORAL_TABLET | Freq: Two times a day (BID) | ORAL | Status: DC

## 2018-01-11 NOTE — Assessment & Plan Note (Signed)
Long standing history and non compliant. Was on norvasc in past, but want to change her to lisinopril. Will start her on 20mg  daily and see her back in one month. Side effects of medication discussed including dry cough and angioedema with ER precautions. Baseline EKG today and routine labs work. Again stressed I need to see her back in one month. ER precautions given for chest pain, headache, vision changes, shortness of breath. Also encouraged her to stop smoking. Risks of uncontrolled HTN discussed, but not limited to MI, CVA, cancer association, peripheral neuropathy.
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
## 2018-01-12 LAB — CBC WITH DIFFERENTIAL/PLATELET
Basophils Absolute: 0.1 10*3/uL (ref 0.0–0.1)
Basophils Relative: 0.9 % (ref 0.0–3.0)
Eosinophils Absolute: 0.2 10*3/uL (ref 0.0–0.7)
Eosinophils Relative: 2.2 % (ref 0.0–5.0)
HCT: 49.9 % — ABNORMAL HIGH (ref 36.0–46.0)
Hemoglobin: 16.8 g/dL — ABNORMAL HIGH (ref 12.0–15.0)
Lymphocytes Relative: 28.6 % (ref 12.0–46.0)
Lymphs Abs: 2.3 10*3/uL (ref 0.7–4.0)
MCHC: 33.8 g/dL (ref 30.0–36.0)
MCV: 96.6 fl (ref 78.0–100.0)
Monocytes Absolute: 0.6 10*3/uL (ref 0.1–1.0)
Monocytes Relative: 7.4 % (ref 3.0–12.0)
Neutro Abs: 4.8 10*3/uL (ref 1.4–7.7)
Neutrophils Relative %: 60.9 % (ref 43.0–77.0)
Platelets: 266 10*3/uL (ref 150.0–400.0)
RBC: 5.17 Mil/uL — ABNORMAL HIGH (ref 3.87–5.11)
RDW: 12.8 % (ref 11.5–15.5)
WBC: 7.9 10*3/uL (ref 4.0–10.5)

## 2018-01-12 LAB — LIPID PANEL
Cholesterol: 213 mg/dL — ABNORMAL HIGH (ref 0–200)
HDL: 51.8 mg/dL (ref >39.00)
LDL Cholesterol: 136 mg/dL — ABNORMAL HIGH (ref 0–99)
NonHDL: 161.16
Total CHOL/HDL Ratio: 4
Triglycerides: 125 mg/dL (ref 0.0–149.0)
VLDL: 25 mg/dL (ref 0.0–40.0)

## 2018-01-12 LAB — COMPREHENSIVE METABOLIC PANEL
ALT: 12 U/L (ref 0–35)
AST: 14 U/L (ref 0–37)
Albumin: 4.2 g/dL (ref 3.5–5.2)
Alkaline Phosphatase: 65 U/L (ref 39–117)
BUN: 11 mg/dL (ref 6–23)
CO2: 29 mEq/L (ref 19–32)
Calcium: 9.5 mg/dL (ref 8.4–10.5)
Chloride: 101 mEq/L (ref 96–112)
Creatinine, Ser: 0.97 mg/dL (ref 0.40–1.20)
GFR: 65.03 mL/min (ref >60.00)
Glucose, Bld: 82 mg/dL (ref 70–99)
Potassium: 5.3 mEq/L — ABNORMAL HIGH (ref 3.5–5.1)
Sodium: 138 mEq/L (ref 135–145)
Total Bilirubin: 0.6 mg/dL (ref 0.2–1.2)
Total Protein: 7 g/dL (ref 6.0–8.3)

## 2018-01-12 LAB — MICROALBUMIN / CREATININE URINE RATIO
Creatinine,U: 222.1 mg/dL
Microalb Creat Ratio: 1.9 mg/g (ref 0.0–30.0)
Microalb, Ur: 4.1 mg/dL — ABNORMAL HIGH (ref 0.0–1.9)

## 2018-01-12 LAB — TSH: TSH: 1.23 u[IU]/mL (ref 0.35–4.50)

## 2018-01-12 LAB — HIV ANTIBODY (ROUTINE TESTING W REFLEX): HIV 1&2 Ab, 4th Generation: NONREACTIVE

## 2018-01-13 ENCOUNTER — Other Ambulatory Visit: Payer: Self-pay | Admitting: Family Medicine

## 2018-01-13 DIAGNOSIS — M25562 Pain in left knee: Principal | ICD-10-CM

## 2018-01-13 DIAGNOSIS — G8929 Other chronic pain: Secondary | ICD-10-CM

## 2018-01-13 NOTE — Progress Notes (Signed)
Let her know I talked to Dr. Berline Chough... He agrees that she needs to see ortho and get this scoped. I have done referral to [facility] for this. They should call her and she can cancel her f/u with DR. Berline Chough.  ALSO HAS LAB NOTE>

## 2018-01-13 NOTE — Progress Notes (Signed)
Spoke to told her Dr. Artis Flock  talked to Dr. Berline Chough... He agrees that she needs to see ortho and get this scoped. She has sent a  referral to [facility] for this.  Someone will contact you to schedule an appointment. Pt verbalized understanding. Told pt she can cancel F/U appt with Dr. Berline Chough. Told pt will cancel appt for her. Pt verbalized understanding.

## 2018-01-19 ENCOUNTER — Encounter: Payer: Self-pay | Admitting: Sports Medicine

## 2018-01-20 ENCOUNTER — Encounter: Payer: Self-pay | Admitting: Family Medicine

## 2018-01-20 DIAGNOSIS — F1410 Cocaine abuse, uncomplicated: Secondary | ICD-10-CM | POA: Insufficient documentation

## 2018-01-20 LAB — URINE DRUGS OF ABUSE SCREEN W ALC, ROUTINE (REF LAB)
ALCOHOL, ETHYL (U): NEGATIVE
AMPHETAMINES (1000 ng/mL SCRN): NEGATIVE
BARBITURATES: NEGATIVE
BENZODIAZEPINES: NEGATIVE
COCAINE METABOLITES: POSITIVE — AB
MARIJUANA MET (50 ng/mL SCRN): POSITIVE — AB
METHADONE: NEGATIVE
METHAQUALONE: NEGATIVE
OPIATES: NEGATIVE
PHENCYCLIDINE: NEGATIVE
PROPOXYPHENE: NEGATIVE

## 2018-01-24 ENCOUNTER — Ambulatory Visit (INDEPENDENT_AMBULATORY_CARE_PROVIDER_SITE_OTHER): Payer: Self-pay | Admitting: Orthopaedic Surgery

## 2018-02-07 ENCOUNTER — Other Ambulatory Visit: Payer: Self-pay | Admitting: Family Medicine

## 2018-02-14 NOTE — Progress Notes (Signed)
Patient: CAELIE WADLEIGH MRN: [id number] DOB: 03-Aug-1971 PCP: Orland Mustard, MD     Subjective:  Chief Complaint  Patient presents with  . f/u hypertension    doing well per pt.    HPI: The patient is a 47 y.o. female who presents today for hypertension.   Hypertension: Here for follow up of hypertension.  Currently on lisinopril 20mg . She has not taken her medication today.  Takes medication as prescribed and denies any side effects. Exercise includes none. Weight has been stable. Denies any chest pain, headaches, shortness of breath, vision changes, swelling in lower extremities. She has not taken her blood pressure at home and has not taken her medication today since she did not eat. She was also positive for cocaine on her UDS which is not helping with her HTN.   Review of Systems  Constitutional: Negative for activity change, appetite change and fatigue.  Respiratory: Negative for chest tightness and shortness of breath.   Cardiovascular: Negative.  Negative for chest pain, palpitations and leg swelling.  Neurological: Negative for dizziness, weakness, light-headedness and headaches.  Psychiatric/Behavioral: The patient is not nervous/anxious.     Allergies Patient has No Known Allergies.  Past Medical History Patient  has no past medical history on file.  Surgical History Patient  has a past surgical history that includes Tubal ligation and Hiatal hernia repair (2015).  Family History Pateint's family history is not on file.  Social History Patient  reports that she has been smoking.  She has never used smokeless tobacco. She reports that she does not drink alcohol or use drugs.    Objective: Vitals:   02/15/18 0802 02/15/18 0822  BP: (!) 152/100 140/90  Pulse: 64   Temp: 98 F (36.7 C)   TempSrc: Oral   SpO2: 96%   Weight: 197 lb 9.6 oz (89.6 kg)   Height: 5\' 6"  (1.676 m)     Body mass index is 31.89 kg/m.  Physical Exam  Constitutional: She appears  well-developed and well-nourished.  Tobacco odor.   Neck: Normal range of motion. Neck supple.  Cardiovascular: Normal rate, regular rhythm and normal heart sounds.  No murmur heard. Pulmonary/Chest: Effort normal and breath sounds normal.  Abdominal: Soft. Bowel sounds are normal. There is no tenderness.  Psychiatric: She has a normal mood and affect. Her behavior is normal.  Vitals reviewed.      Assessment/plan: Blood pressure is not to goal. Increasing lisinopril to 40mg . Will come back in one month for BP check and labs to monitor potassium.  Refills given. Also asked that she take medication when she comes so I can see what she is running and she is going to start a home log to monitor pressure. If light headed/dizzy/fatigued she is to let me know as I do not want to drop her too much.  Recommended routine exercise and healthy diet including DASH diet and mediterranean diet. Encouraged weight loss.     Return in about 1 month (around 03/17/2018) for nurse BP check and lab.    Orland Mustard, MD [place] [facility]   02/15/2018

## 2018-02-15 ENCOUNTER — Encounter: Payer: Self-pay | Admitting: Family Medicine

## 2018-02-15 ENCOUNTER — Ambulatory Visit (INDEPENDENT_AMBULATORY_CARE_PROVIDER_SITE_OTHER): Payer: BLUE CROSS/BLUE SHIELD | Admitting: Family Medicine

## 2018-02-15 VITALS — BP 140/90 | HR 64 | Temp 98.0°F | Ht 66.0 in | Wt 197.6 lb

## 2018-02-15 DIAGNOSIS — I10 Essential (primary) hypertension: Secondary | ICD-10-CM

## 2018-02-15 MED ORDER — LISINOPRIL 40 MG PO TABS: 40.0000 mg | ORAL_TABLET | Freq: Every day | ORAL | 3 refills | Status: DC

## 2018-03-02 ENCOUNTER — Ambulatory Visit: Payer: BLUE CROSS/BLUE SHIELD | Admitting: Sports Medicine

## 2018-03-16 ENCOUNTER — Other Ambulatory Visit: Payer: BLUE CROSS/BLUE SHIELD

## 2018-03-16 ENCOUNTER — Ambulatory Visit: Payer: BLUE CROSS/BLUE SHIELD

## 2018-03-21 ENCOUNTER — Other Ambulatory Visit: Payer: BLUE CROSS/BLUE SHIELD

## 2018-03-21 ENCOUNTER — Ambulatory Visit: Payer: BLUE CROSS/BLUE SHIELD

## 2018-03-22 ENCOUNTER — Ambulatory Visit (INDEPENDENT_AMBULATORY_CARE_PROVIDER_SITE_OTHER): Payer: BLUE CROSS/BLUE SHIELD | Admitting: Family Medicine

## 2018-03-22 ENCOUNTER — Other Ambulatory Visit: Payer: BLUE CROSS/BLUE SHIELD

## 2018-03-22 ENCOUNTER — Ambulatory Visit: Payer: BLUE CROSS/BLUE SHIELD

## 2018-03-22 ENCOUNTER — Encounter: Payer: Self-pay | Admitting: Family Medicine

## 2018-03-22 VITALS — BP 150/112

## 2018-03-22 DIAGNOSIS — R202 Paresthesia of skin: Secondary | ICD-10-CM | POA: Diagnosis not present

## 2018-03-22 DIAGNOSIS — I10 Essential (primary) hypertension: Secondary | ICD-10-CM

## 2018-03-22 LAB — BASIC METABOLIC PANEL
BUN: 16 mg/dL (ref 6–23)
CO2: 27 mEq/L (ref 19–32)
Calcium: 9.1 mg/dL (ref 8.4–10.5)
Chloride: 107 mEq/L (ref 96–112)
Creatinine, Ser: 0.89 mg/dL (ref 0.40–1.20)
GFR: 71.76 mL/min (ref >60.00)
Glucose, Bld: 110 mg/dL — ABNORMAL HIGH (ref 70–99)
Potassium: 3.9 mEq/L (ref 3.5–5.1)
Sodium: 140 mEq/L (ref 135–145)

## 2018-03-22 LAB — CBC
HCT: 48 % — ABNORMAL HIGH (ref 36.0–46.0)
Hemoglobin: 16.5 g/dL — ABNORMAL HIGH (ref 12.0–15.0)
MCHC: 34.2 g/dL (ref 30.0–36.0)
MCV: 94.6 fl (ref 78.0–100.0)
Platelets: 207 10*3/uL (ref 150.0–400.0)
RBC: 5.08 Mil/uL (ref 3.87–5.11)
RDW: 13.6 % (ref 11.5–15.5)
WBC: 6.8 10*3/uL (ref 4.0–10.5)

## 2018-03-22 LAB — VITAMIN B12: Vitamin B-12: 255 pg/mL (ref 211–911)

## 2018-03-22 MED ORDER — LOSARTAN POTASSIUM-HCTZ 100-12.5 MG PO TABS: 1.0000 | ORAL_TABLET | Freq: Every day | ORAL | 1 refills | Status: AC

## 2018-03-22 MED ORDER — CYCLOBENZAPRINE HCL 10 MG PO TABS: 10.0000 mg | ORAL_TABLET | Freq: Three times a day (TID) | ORAL | 0 refills | Status: DC | PRN

## 2018-03-22 NOTE — Progress Notes (Signed)
Pt her  Patient: Katherine Saunders MRN: [id number] DOB: 01/22/1971 PCP: Orland Mustard, MD     Subjective:  No chief complaint on file.   HPI: The patient is a 47 y.o. female who presents today for HTN follow up and other symptoms. We increased her lisinopril to 40mg  at the end of June. Her blood pressure is higher than it was when I saw her last time. She is taking medications daily. She denies any drug use. Was positive for cocaine in the past. She thinks the tingling in her arm started when I increased her medication.   She has been having right shoulder pain for about 2-3 weeks that is constant. It's on the back and hurts when she is lying flat. She has tingling that starts at the top of her shoulder and goes down her forearm. It itches at the end of her arm. This feeling comes and goes. She has no chest pain, palpitations, shortness of breath, headaches. She feels like this started when we increased her lisinopril to 40mg . She has no loss of strength. No trauma or injury.   Pt brought in readings from home and are as follows:  7/15-146/108 7/16-147/105 7/19-157/119 7/24-150/115  *All readings were taken after eating lunch and after taking medicine.  Review of Systems  Constitutional: Negative for chills and fever.  Respiratory: Negative for cough and shortness of breath.   Cardiovascular: Negative for chest pain, palpitations and leg swelling.  Gastrointestinal: Negative for abdominal pain.  Musculoskeletal: Positive for arthralgias (right shoulder/back ). Negative for gait problem and joint swelling.  Neurological: Negative for dizziness, facial asymmetry and numbness.       Tingling in right arm     Allergies Patient has No Known Allergies.  Past Medical History Patient  has no past medical history on file.  Surgical History Patient  has a past surgical history that includes Tubal ligation and Hiatal hernia repair (2015).  Family History Pateint's family history is  not on file.  Social History Patient  reports that she has been smoking.  She has never used smokeless tobacco. She reports that she does not drink alcohol or use drugs.    Objective: Vitals:   03/22/18 1418  BP: (!) 150/112    There is no height or weight on file to calculate BMI.  Physical Exam  Constitutional: She appears well-developed and well-nourished.  Tobacco odor.   Neck: Normal range of motion. Neck supple.  Cardiovascular: Normal rate, regular rhythm and normal heart sounds.  Pulmonary/Chest: Effort normal and breath sounds normal.  Abdominal: Soft. Bowel sounds are normal.  Musculoskeletal: Normal range of motion.  Right shoulder exam normal. On pain with palpation over humeral head. Pain more in her right trapezius/edge of scapula. ROM intact.   Neurological: No cranial nerve deficit or sensory deficit. She exhibits normal muscle tone. Coordination normal.  Strength in upper extremities intact.   Vitals reviewed.      Assessment/plan: 1. Essential hypertension There is a 1% side effect of parasthesia with lisinopil. We are going to stop it and change her to losartan 100mg  and add on hctz 12.5mg  since she is still so elevated in her readings on 40mg  of lisinopril. She denies any drug use, but if still using cocaine could be increasing her bp. Checking potassium since on 40mg  of lisinopril. Also does not drink excessive beer or sweat a lot so as not to drop her sodium with the hctz. Will come back in 2 weeks for bp check  again. Continue to keep her log. Any issue with new medication, let me know.   2. Tingling Doubtful from lisinopril, but will change medication to see if helpful. Pain more in upper back, not so much her shoulder. Will do trial of muscle relaxer to see if helps her upper back. Drowsy precautions given. She has had before and never had an issue. Also could be stress related as she has a lot of stressors recently in her life. Neuro exam normal and no chest  pain or cardiac symptoms. Will let me know if not getting better when I see her back for close follow up. Also checking b12. tsh normal 2 months ago.      Return in about 2 weeks (around 04/05/2018) for blood pressure .     Orland Mustard, MD [place] [facility]  03/22/2018

## 2018-04-12 ENCOUNTER — Ambulatory Visit (INDEPENDENT_AMBULATORY_CARE_PROVIDER_SITE_OTHER): Payer: BLUE CROSS/BLUE SHIELD | Admitting: Family Medicine

## 2018-04-12 ENCOUNTER — Ambulatory Visit: Payer: BLUE CROSS/BLUE SHIELD | Admitting: Family Medicine

## 2018-04-12 ENCOUNTER — Encounter: Payer: Self-pay | Admitting: Family Medicine

## 2018-04-12 VITALS — BP 128/92 | HR 65 | Temp 97.8°F | Ht 66.0 in | Wt 191.6 lb

## 2018-04-12 DIAGNOSIS — I10 Essential (primary) hypertension: Secondary | ICD-10-CM | POA: Diagnosis not present

## 2018-04-12 NOTE — Progress Notes (Deleted)
Patient: Katherine Saunders MRN: [id number] DOB: 05-29-1971 PCP: Orland Mustard, MD     Subjective:  No chief complaint on file.   HPI: The patient is a 47 y.o. female who presents today for ***  Review of Systems  Allergies Patient has No Known Allergies.  Past Medical History Patient  has no past medical history on file.  Surgical History Patient  has a past surgical history that includes Tubal ligation and Hiatal hernia repair (2015).  Family History Pateint's family history is not on file.  Social History Patient  reports that she has been smoking. She has never used smokeless tobacco. She reports that she does not drink alcohol or use drugs.    Objective: There were no vitals filed for this visit.  There is no height or weight on file to calculate BMI.  Physical Exam     Assessment/plan:      No follow-ups on file.     @AWME @ 04/12/2018

## 2018-04-12 NOTE — Progress Notes (Signed)
Patient: Katherine Saunders MRN: [id number] DOB: Jan 11, 1971 PCP: Orland Mustard, MD     Subjective:  Chief Complaint  Patient presents with  . Follow-up  . Hypertension    HPI: The patient is a 47 y.o. female who presents today for hypertension.   Hypertension: Here for follow up of hypertension.  Currently on losartan and hctz . Home readings range from 140-160 systolic/90-100 diastolic. Takes medication as prescribed and denies any side effects. Exercise includes none. Weight has been stable. Denies any chest pain, headaches, shortness of breath, vision changes, swelling in lower extremities. She takes her blood pressures at night after a stressful day at work. Takes medication in the AM. Has had no other side effects from initial ones she experienced for one day.  Follow up after I changed her from lisinopril to losartan and added on hctz. All labs normal after on max of lisinopril.    Review of Systems  Constitutional: Negative for fatigue.  Respiratory: Negative for shortness of breath.   Cardiovascular: Negative for chest pain.  Gastrointestinal: Negative for abdominal pain and nausea.  Musculoskeletal: Negative for back pain.  Neurological: Negative for dizziness and headaches.  Psychiatric/Behavioral: The patient is not nervous/anxious.     Allergies Patient has No Known Allergies.  Past Medical History Patient  has no past medical history on file.  Surgical History Patient  has a past surgical history that includes Tubal ligation and Hiatal hernia repair (2015).  Family History Pateint's family history is not on file.  Social History Patient  reports that she has been smoking. She has never used smokeless tobacco. She reports that she does not drink alcohol or use drugs.    Objective: Vitals:   04/12/18 0837 04/12/18 0900  BP: 132/90 (!) 128/92  Pulse: 65   Temp: 97.8 F (36.6 C)   TempSrc: Oral   SpO2: 97%   Weight: 191 lb 9.6 oz (86.9 kg)   Height: 5'  6" (1.676 m)     Body mass index is 30.93 kg/m.  Physical Exam  Constitutional: She is oriented to person, place, and time. She appears well-developed and well-nourished.  Tobacco odor.   Neck: No thyromegaly present.  Cardiovascular: Normal rate, regular rhythm, normal heart sounds and intact distal pulses.  Pulmonary/Chest: Effort normal and breath sounds normal.  Abdominal: Soft. Bowel sounds are normal.  Neurological: She is alert and oriented to person, place, and time.  Psychiatric: She has a normal mood and affect. Her behavior is normal.  Vitals reviewed.      Assessment/plan: Blood pressure is much improved and almost to goal. Continue current anti-hypertensive medications. Refills given at last visit and routine lab work was done. Recommended routine exercise and healthy diet including DASH diet and mediterranean diet. Encouraged weight loss and smoking cessation. I would like her diastolic lower. She is going to continue to keep a home log for me and take readings in AM. If diastolic >90 on consistent basis we will increase her hctz to 25mg . She will let me know. Will follow up with potassium if we do increase this. Will come back for BP check in 1 month for labs/check by nurse if we do increase her hctz.       Return if symptoms worsen or fail to improve.     Orland Mustard, MD [facility]   04/12/2018

## 2018-05-15 ENCOUNTER — Emergency Department (HOSPITAL_BASED_OUTPATIENT_CLINIC_OR_DEPARTMENT_OTHER): Payer: BLUE CROSS/BLUE SHIELD

## 2018-05-15 ENCOUNTER — Encounter (HOSPITAL_BASED_OUTPATIENT_CLINIC_OR_DEPARTMENT_OTHER): Payer: Self-pay | Admitting: Emergency Medicine

## 2018-05-15 ENCOUNTER — Emergency Department (HOSPITAL_BASED_OUTPATIENT_CLINIC_OR_DEPARTMENT_OTHER)
Admission: EM | Admit: 2018-05-15 | Discharge: 2018-05-15 | Disposition: A | Discharge status: Home / Self Care | Payer: BLUE CROSS/BLUE SHIELD | Attending: Emergency Medicine | Admitting: Emergency Medicine

## 2018-05-15 ENCOUNTER — Other Ambulatory Visit: Payer: Self-pay

## 2018-05-15 DIAGNOSIS — Z79899 Other long term (current) drug therapy: Secondary | ICD-10-CM | POA: Insufficient documentation

## 2018-05-15 DIAGNOSIS — M542 Cervicalgia: Secondary | ICD-10-CM

## 2018-05-15 DIAGNOSIS — M5030 Other cervical disc degeneration, unspecified cervical region: Secondary | ICD-10-CM

## 2018-05-15 DIAGNOSIS — M5410 Radiculopathy, site unspecified: Secondary | ICD-10-CM | POA: Diagnosis not present

## 2018-05-15 DIAGNOSIS — I10 Essential (primary) hypertension: Secondary | ICD-10-CM | POA: Insufficient documentation

## 2018-05-15 DIAGNOSIS — F17210 Nicotine dependence, cigarettes, uncomplicated: Secondary | ICD-10-CM | POA: Insufficient documentation

## 2018-05-15 DIAGNOSIS — M792 Neuralgia and neuritis, unspecified: Secondary | ICD-10-CM

## 2018-05-15 HISTORY — DX: Essential (primary) hypertension: I10

## 2018-05-15 MED ORDER — HYDROCODONE-ACETAMINOPHEN 5-325 MG PO TABS: 1.0000 | ORAL_TABLET | Freq: Four times a day (QID) | ORAL | 0 refills | Status: AC | PRN

## 2018-05-15 MED ORDER — PREDNISONE 20 MG PO TABS: ORAL_TABLET | ORAL | 0 refills | Status: AC

## 2018-05-15 MED ORDER — IBUPROFEN 400 MG PO TABS
400.0000 mg | ORAL_TABLET | Freq: Once | ORAL | Status: AC
  Administered 2018-05-15: 400 mg via ORAL
  Filled 2018-05-15: qty 1

## 2018-05-15 MED ORDER — HYDROCODONE-ACETAMINOPHEN 5-325 MG PO TABS
2.0000 | ORAL_TABLET | Freq: Once | ORAL | Status: AC
  Administered 2018-05-15: 2 via ORAL
  Filled 2018-05-15: qty 2

## 2018-05-15 MED FILL — predniSONE 20 MG TABS: 20 | 8 days supply | Qty: 15 | Fill #0

## 2018-05-15 MED FILL — HYDROCODON-APAP 5-325: 5-325 | 3 days supply | Qty: 20 | Fill #0

## 2018-05-15 NOTE — ED Provider Notes (Signed)
[facility] EMERGENCY DEPARTMENT Provider Note   CSN: [id number] Arrival date & time: 05/15/18  0818     History   Chief Complaint Chief Complaint  Patient presents with  . Neck Pain    HPI Katherine Saunders is a 47 y.o. female.  Patient c/o right neck pain posteriorly, radiating to and down right arm for the past 2-3 weeks. Symptoms persistent, constant, mod-severe. States awoke with it one day, denies specific injury or strain. No hx ddd. No swelling, redness or skin changes to area of pain. No numbness/weakness or loss of normal functional ability. No anterior pain. No chest pain or discomfort. No sob. Has not taken anything for pain today. No hx ddd.   The history is provided by the patient.  Back Pain   Pertinent negatives include no chest pain, no fever, no headaches and no abdominal pain.    Past Medical History:  Diagnosis Date  . Hypertension     Patient Active Problem List   Diagnosis Date Noted  . Drug abuse, cocaine type (HCC) 01/20/2018  . HTN (hypertension) 01/11/2018  . Effusion of left knee 01/05/2018  . Left knee injury 03/03/2016    Past Surgical History:  Procedure Laterality Date  . HIATAL HERNIA REPAIR  2015  . TUBAL LIGATION       OB History   None      Home Medications    Prior to Admission medications   Medication Sig Start Date End Date Taking? Authorizing Provider  cyclobenzaprine (FLEXERIL) 10 MG tablet Take 1 tablet (10 mg total) by mouth 3 (three) times daily as needed for muscle spasms. 03/22/18   Orland Mustard, MD  losartan-hydrochlorothiazide (HYZAAR) 100-12.5 MG tablet Take 1 tablet by mouth daily. 03/22/18   Orland Mustard, MD  meloxicam (MOBIC) 15 MG tablet Take 1 tablet (15 mg total) by mouth daily. 01/11/18   Orland Mustard, MD    Family History History reviewed. No pertinent family history.  Social History Social History   Tobacco Use  . Smoking status: Current Every Day Smoker    Packs/day: 2.00   Types: Cigarettes  . Smokeless tobacco: Never Used  Substance Use Topics  . Alcohol use: No    Alcohol/week: 0.0 standard drinks  . Drug use: No     Allergies   Patient has no known allergies.   Review of Systems Review of Systems  Constitutional: Negative for fever.  HENT: Negative for sore throat and trouble swallowing.   Eyes: Negative for redness.  Respiratory: Negative for shortness of breath.   Cardiovascular: Negative for chest pain.  Gastrointestinal: Negative for abdominal pain.  Genitourinary: Negative for flank pain.  Musculoskeletal: Positive for neck pain. Negative for back pain.  Skin: Negative for rash.  Neurological: Negative for headaches.  Hematological: Does not bruise/bleed easily.  Psychiatric/Behavioral: Negative for confusion.     Physical Exam Updated Vital Signs BP (!) 144/90   Pulse 76   Temp 98.3 F (36.8 C) (Oral)   Resp 16   Ht 1.702 m (5\' 7" )   Wt 86.2 kg   LMP 03/16/2018 (Approximate)   SpO2 99%   BMI 29.76 kg/m   Physical Exam  Constitutional: She appears well-developed and well-nourished.  HENT:  Head: Atraumatic.  Eyes: Conjunctivae are normal. No scleral icterus.  Neck: Neck supple. No tracheal deviation present.  No neck mass/swelling.   Cardiovascular: Normal rate, regular rhythm, normal heart sounds and intact distal pulses. Exam reveals no gallop and no friction rub.  No murmur heard. Pulmonary/Chest: Effort normal and breath sounds normal. No respiratory distress.  Abdominal: Normal appearance. She exhibits no distension.  Musculoskeletal: She exhibits no edema.  C/T spine non tender, aligned, no step off. Right neck and right trapezius muscular tenderness. No skin changes/lesions to area of pain. Good rom right shoulder without pain. Radial pulse 2+. No arm swelling.   Lymphadenopathy:    She has no cervical adenopathy.  Neurological: She is alert.  RUE, motor/sens intact, stre 5/5.   Skin: Skin is warm and dry. No  rash noted.  Psychiatric:  Tearful c/o pain  Nursing note and vitals reviewed.    ED Treatments / Results  Labs (all labs ordered are listed, but only abnormal results are displayed) Labs Reviewed - No data to display  EKG None  Radiology Ct Cervical Spine Wo Contrast  Result Date: 05/15/2018 CLINICAL DATA:  Neck pain for 3 weeks without known injury. EXAM: CT CERVICAL SPINE WITHOUT CONTRAST TECHNIQUE: Multidetector CT imaging of the cervical spine was performed without intravenous contrast. Multiplanar CT image reconstructions were also generated. COMPARISON:  None. FINDINGS: Alignment: Normal. Skull base and vertebrae: No acute fracture. No primary bone lesion or focal pathologic process. Soft tissues and spinal canal: No prevertebral fluid or swelling. No visible canal hematoma. Disc levels: Mild degenerative disc disease is noted at C5-6. Moderate degenerative disc disease is noted at C6-7 with anterior osteophyte formation. Upper chest: Negative. Other: None. IMPRESSION: Mild-to-moderate multilevel degenerative disc disease. No acute abnormality seen in the cervical spine. Electronically Signed   By: Lupita Raider, M.D.   On: 05/15/2018 10:04    Procedures Procedures (including critical care time)  Medications Ordered in ED Medications  ibuprofen (ADVIL,MOTRIN) tablet 400 mg (400 mg Oral Given 05/15/18 0944)  HYDROcodone-acetaminophen (NORCO/VICODIN) 5-325 MG per tablet 2 tablet (2 tablets Oral Given 05/15/18 0944)     Initial Impression / Assessment and Plan / ED Course  I have reviewed the triage vital signs and the nursing notes.  Pertinent labs & imaging results that were available during my care of the patient were reviewed by me and considered in my medical decision making (see chart for details).  Pt states has ride, does not have to drive.   Imaging study ordered.  Reviewed nursing notes and prior charts for additional history.   CT reviewed - DDD. Discussed w  pt. Suspect possible nerve impingement/radicular pain.  Prednisone po. Hydrocodone po.  Recheck pain improved.   rec pcp f/u. rx for home. Return precautions.     Final Clinical Impressions(s) / ED Diagnoses   Final diagnoses:  None    ED Discharge Orders    None       Cathren Laine, MD 05/15/18 1023

## 2018-05-15 NOTE — Discharge Instructions (Addendum)
It was our pleasure to provide your ER care today - we hope that you feel better.  Heat to sore area, try gentle massage of area.   Take prednisone as prescribed.  You may also take hydrocodone as need for pain. No driving for the next 6 hours or when taking hydrocodone. Also, do not take tylenol or acetaminophen containing medication when taking hydrocodone.  Follow up with primary care doctor in 1 week for recheck.  Return to ER if worse, new symptoms, numbness/weakness, high fevers, other concern.

## 2018-05-15 NOTE — ED Triage Notes (Signed)
Reports right upper back and neck pain x 3 weeks, worse today.  Seen by PCP and given flexeril.  Reports no relief with flexeril.

## 2018-05-16 ENCOUNTER — Other Ambulatory Visit: Payer: Self-pay | Admitting: Family Medicine

## 2018-05-16 MED ORDER — CYCLOBENZAPRINE HCL 10 MG PO TABS: 10.0000 mg | ORAL_TABLET | Freq: Three times a day (TID) | ORAL | 0 refills | Status: AC | PRN

## 2018-05-16 NOTE — Telephone Encounter (Signed)
Copied from CRM [id number]. Topic: Quick Communication - Rx Refill/Question >> May 16, 2018  2:54 PM Tamela Oddi, NT wrote: Medication: cyclobenzaprine (FLEXERIL) 10 MG tablet  Has the patient contacted their pharmacy? No. (Agent: If no, request that the patient contact the pharmacy for the refill.) (Agent: If yes, when and what did the pharmacy advise?)  Preferred Pharmacy (with phone number or street name): [id number] [place] [place] - [address] [phone] (Phone) [phone] (Fax)  Patient has an appt on Friday 05/19/18 is wanting some to hold her over until then   Agent: Please be advised that RX refills may take up to 3 business days. We ask that you follow-up with your pharmacy.

## 2018-05-16 NOTE — Telephone Encounter (Signed)
Flexeril refill Last Refill: 03/22/18, # 30, no refill Last OV: 03/22/18   PCP: Dr. Artis Flock [facility] [address]  Pt. has an appt. on 05/19/18

## 2018-05-19 ENCOUNTER — Ambulatory Visit (INDEPENDENT_AMBULATORY_CARE_PROVIDER_SITE_OTHER): Payer: BLUE CROSS/BLUE SHIELD

## 2018-05-19 ENCOUNTER — Encounter: Payer: Self-pay | Admitting: Family Medicine

## 2018-05-19 ENCOUNTER — Ambulatory Visit (INDEPENDENT_AMBULATORY_CARE_PROVIDER_SITE_OTHER): Payer: BLUE CROSS/BLUE SHIELD | Admitting: Family Medicine

## 2018-05-19 ENCOUNTER — Telehealth: Payer: Self-pay | Admitting: Family Medicine

## 2018-05-19 VITALS — BP 132/96 | HR 82 | Temp 98.3°F | Ht 67.0 in | Wt 201.8 lb

## 2018-05-19 DIAGNOSIS — M542 Cervicalgia: Secondary | ICD-10-CM | POA: Diagnosis not present

## 2018-05-19 DIAGNOSIS — M25511 Pain in right shoulder: Secondary | ICD-10-CM

## 2018-05-19 MED ORDER — GABAPENTIN 300 MG PO CAPS: 300.0000 mg | ORAL_CAPSULE | Freq: Three times a day (TID) | ORAL | 3 refills | Status: DC

## 2018-05-19 NOTE — Patient Instructions (Signed)
Do heating pad over that right shoulder  Adding on gabapentin to help with nerve pain. Can take up to 3x.day. We can increase dosage if needed.  Ordered MRI of your neck and xraying shoulder to look at area that you feel and where you hurt.   Keep up the muscle relaxer's.

## 2018-05-19 NOTE — Telephone Encounter (Signed)
Please let her know I do not see anything acute on her xray. i'll let her know when they officially read it.

## 2018-05-19 NOTE — Progress Notes (Signed)
Patient: Katherine Saunders MRN: [id number] DOB: 05/06/1971 PCP: Orland Mustard, MD     Subjective:  Chief Complaint  Patient presents with  . Back Pain    f/u ED visit    HPI: The patient is a 47 y.o. female who presents today for follow up from ED for back pain. Seen on 05/15/18 for right neck pain that radiated down the right arm for the past 2-3 weeks. CT cervical spine done in ER. She has mild to moderate multilevel degenerative disc disease. No acute abnormality. She was given flexeril and hydrocodone and this  Is not helping her at all. She was given oral steroid taper and she is not sure if this is helping.  I refilled her flexeril 3 days ago. No trauma. She does have weakness and continued feelings of numbness/tingling. Hand grip is weak. She does have full rom in her shoulder. She has quite impressive pain over her right superior trapezius and feels like it is bulging. Muscle relaxers not helping and has been putting ice on it.   Review of Systems  Constitutional: Negative for chills and fever.  Eyes: Negative for visual disturbance.  Respiratory: Negative for shortness of breath.   Cardiovascular: Negative for chest pain and palpitations.  Musculoskeletal: Positive for back pain, myalgias and neck pain. Negative for neck stiffness.       Right shoulder/arm pain  Neurological: Positive for weakness and numbness. Negative for dizziness, facial asymmetry and headaches.    Allergies Patient has No Known Allergies.  Past Medical History Patient  has a past medical history of Hypertension.  Surgical History Patient  has a past surgical history that includes Tubal ligation and Hiatal hernia repair (2015).  Family History Pateint's family history is not on file.  Social History Patient  reports that she has been smoking cigarettes. She has been smoking about 2.00 packs per day. She has never used smokeless tobacco. She reports that she does not drink alcohol or use drugs.     Objective: Vitals:   05/19/18 1535  BP: (!) 132/96  Pulse: 82  Temp: 98.3 F (36.8 C)  TempSrc: Oral  SpO2: 96%  Weight: 201 lb 12.8 oz (91.5 kg)  Height: 5\' 7"  (1.702 m)    Body mass index is 31.61 kg/m.  Physical Exam  Constitutional: She is oriented to person, place, and time. She appears well-developed and well-nourished.  Tobacco odor.   Neck: Normal range of motion. Neck supple.  Cardiovascular: Normal rate, regular rhythm and normal heart sounds.  Pulmonary/Chest: Effort normal and breath sounds normal.  Musculoskeletal: She exhibits no edema.  She has quite severe pain on the superior aspect of her right trapezius with a bulge. Neck rom intact. Shoulder exam normal on right. Also TTP over medial aspect of scapula over rhomboid. No TTP over actual cervical spine.   Lymphadenopathy:    She has no cervical adenopathy.  Neurological: She is alert and oriented to person, place, and time. She displays normal reflexes. No cranial nerve deficit.  Sensation intact on right arm. Hand grip on right decreased about 4/5. Upper extremity strength intact. Cn intact.   Vitals reviewed.  notes/imaging reviewed from ER visit.   Shoulder xray/scapular xray done. Official read pending    Assessment/plan: 1. Cervicalgia Known DJD of her cervical spine from recent CT. Having radicular symptoms down her right arm. Will check MRI and she is ready to see pain management to get injections if warranted. Will add on gabapentin to help with  radicular pain. Starting her off at 300mg  TID. Can increase dosage if we need to do this. Continue with muscle relaxer prn and nsaids. Would benefit from a massage over trapezius. Want her to use heating pads as well instead of ice.  - MR Cervical Spine Wo Contrast; Future  2. Acute pain of right shoulder Shoulder exam normal, but will check xray.  - DG Shoulder Right; Future - DG Scapula Right; Future     Return if symptoms worsen or fail to  improve.    Orland Mustard, MD [facility]   05/19/2018

## 2018-05-22 ENCOUNTER — Telehealth: Payer: Self-pay | Admitting: Family Medicine

## 2018-05-22 NOTE — Telephone Encounter (Signed)
Work excuse faxed to patient's employer attn: Wyman Songster per pt's request.

## 2018-05-22 NOTE — Telephone Encounter (Signed)
Called and spoke with patient and informed per Dr. Tonette Bihari acute seen on xray. Will let her know when official read for xray is complete.  Pt verbalized understanding. Work excuse note faxed to patient's employer per her request.

## 2018-05-22 NOTE — Telephone Encounter (Signed)
Copied from CRM [id number]. Topic: General - Other >> May 22, 2018  1:26 PM Leafy Ro wrote: Reason for CRM: pt needs a work note from 05-19-18 to 05-22-18. Pt will return to work on 05/23/18. Attn mary brewer [phone]. Pt is still waiting on shoulder/neck xray

## 2018-05-23 ENCOUNTER — Telehealth: Payer: Self-pay | Admitting: Family Medicine

## 2018-05-23 NOTE — Telephone Encounter (Signed)
I called and gave patient the number to [place] [facility]. This was faxed to them on 05/22/18 with insurance authorization. Patient verbalized that she would call them.   Copied from CRM [id number]. Topic: General - Other >> May 23, 2018  3:20 PM Ronney Lion A wrote: Reason for CRM: Patient is calling to inquire about her referral for the MRI, says she would like to know when she will be scheduled for this because she is in a lot of pain.

## 2018-05-28 ENCOUNTER — Other Ambulatory Visit: Payer: BLUE CROSS/BLUE SHIELD

## 2018-05-29 ENCOUNTER — Telehealth: Payer: Self-pay | Admitting: *Deleted

## 2018-05-29 ENCOUNTER — Telehealth: Payer: Self-pay | Admitting: Family Medicine

## 2018-05-29 NOTE — Telephone Encounter (Signed)
Copied from CRM [id number]. Topic: Referral - Status >> May 29, 2018  9:05 AM Windy Kalata, NT wrote: Reason for CRM: patient is calling and states she was scheduled for a MRI on her neck and shoulder yesterday 05/28/18 at [facility] and when she got there she was told that her appointment was canceled due to insurance not paying for it. She states her HR contacted the manager at [employer] and states the reasoning for the MRI was not enough information for her insurance to approve. Patient states she is in severe pain and would like Dr. Artis Flock nurse to contact her.

## 2018-05-29 NOTE — Telephone Encounter (Signed)
Called and spoke with patient and advised that MRI has been approved [facility] already notified of this and pt stated that [facility] has already contacted her to schedule her appt which is 05/30/18 @ 3:20 pm.

## 2018-05-29 NOTE — Telephone Encounter (Signed)
[facility] and advised that PA has been updated. They will contact her to schedule. She may also contact [facility] at [phone] to schedule if she does not want to wait to hear from them.

## 2018-05-29 NOTE — Telephone Encounter (Signed)
Peer to peer done and approved for mri of neck  #[id number]  Date: 10/4-11/2.   aw

## 2018-05-29 NOTE — Telephone Encounter (Signed)
Unable to lvm to inform patient that we have approval for the MRI and that [place] [facility] will be contacting her. Will try and contact patient later today.

## 2018-05-30 ENCOUNTER — Ambulatory Visit
Admission: RE | Admit: 2018-05-30 | Discharge: 2018-05-30 | Disposition: A | Discharge status: Home / Self Care | Payer: BLUE CROSS/BLUE SHIELD | Source: Ambulatory Visit | Attending: Family Medicine | Admitting: Family Medicine

## 2018-05-30 DIAGNOSIS — M542 Cervicalgia: Secondary | ICD-10-CM

## 2018-05-31 ENCOUNTER — Other Ambulatory Visit: Payer: Self-pay | Admitting: Family Medicine

## 2018-05-31 DIAGNOSIS — M5020 Other cervical disc displacement, unspecified cervical region: Secondary | ICD-10-CM

## 2018-06-01 ENCOUNTER — Telehealth: Payer: Self-pay | Admitting: Family Medicine

## 2018-06-01 DIAGNOSIS — N870 Mild cervical dysplasia: Secondary | ICD-10-CM | POA: Insufficient documentation

## 2018-06-01 MED ORDER — GABAPENTIN 300 MG PO CAPS: 600.0000 mg | ORAL_CAPSULE | Freq: Three times a day (TID) | ORAL | 0 refills | Status: AC

## 2018-06-01 NOTE — Telephone Encounter (Signed)
See note  Copied from CRM [id number]. Topic: General - Other >> Jun 01, 2018  2:45 PM Gaynelle Adu wrote: Reason for CRM: Patient is calling to request a call back in regards to her imagining results. Please advise

## 2018-06-01 NOTE — Telephone Encounter (Signed)
Increasing her gabapentin to 600mg  to TID. Sent in new px for her.

## 2018-06-01 NOTE — Telephone Encounter (Signed)
Called and spoke with patient and advised that Dr. Artis Flock increased her Gabapentin to 600 mg TID.  She verbalized understanding.  New rx sent to pharmacy.  Pt requesting copy of x-ray, MRI results be mailed to her.

## 2018-06-01 NOTE — Addendum Note (Signed)
Addended by: Orland Mustard on: 06/01/2018 04:08 PM   Modules accepted: Orders

## 2018-06-01 NOTE — Telephone Encounter (Signed)
Called and spoke with patient and reviewed results of MRI.  She verbalized understanding.  She also states that current dosage of Gabapentin is not helping her pain at all.  Please advise.

## 2018-06-26 ENCOUNTER — Ambulatory Visit: Payer: Self-pay | Admitting: *Deleted

## 2018-06-26 NOTE — Telephone Encounter (Signed)
Called and spoke with patient.  BP taken by supervisor just now at work was 181/120.  Feels fine and asymptomatic except for very blurry vision and has been without her Losartan/HCTZ since Sat 11/2 due to pharmacy having the medication on back order.  Pt's supervisor initiated call to EMS and they were en route as I was speaking with patient on the phone.  Pt states that she was just going to agree to EMS checking her out but I strongly encouraged patient to go with EMS to the hospital for further evaluation.  Pt agreed to this plan.  I stated that [facility] [address] [facility] for her Losartan and get that taken care of for her ASAP.    Spoke to Dr. Artis Flock and she agreed with this plan.

## 2018-06-26 NOTE — Telephone Encounter (Addendum)
Pt calling today stating that on the way back to work from lunch today she experienced blurry vision and states it looked as if the "guardrails were going into a V and it was like I  was driving into the point of the V". Pt denies having chest pain, dizziness or headaches at this time and also denies having symptoms prior to today. Pt is currently at work now and wears glasses to see her computer but denies any blurry vision at this time.Pt states she checked her BP while at work approximately 15 min before calling the office and it was 168/107 and 167/108 with HR-103. Pt states she takes Losartan-hydrochlorothiazide but has been without the medication since Saturday.  [facility] [place] [facility] and that an alterative medication would need to be called in to the pharmacy. Pt states that [facility] told her to call the [facility] on [address] or the [facility] because it was showing the system that it may be available at these locations. Pt advised that if symptoms continue she will need to seek treatment in the ED. Pt states she is currently at work and does not get off until 5 pm and does not want to go to the ED, but states she will if she gets worse during the night. Pt states she will call the other pharmacies to see if the medication is available and will return call to the office if she needs a new prescription to be sent to the pharmacy.  Reason for Disposition . Ran out of BP medications  Answer Assessment - Initial Assessment Questions 1. BLOOD PRESSURE: "What is the blood pressure?" "Did you take at least two measurements 5 minutes apart?"      2. ONSET: "When did you take your blood pressure?"      3. HOW: "How did you obtain the blood pressure?" (e.g., visiting nurse, automatic home BP monitor)     automatic 4. HISTORY: "Do you have a history of high blood pressure?"     yes 5. MEDICATIONS: "Are you taking any medications for blood pressure?" "Have you missed  any doses recently?"     ye 6. OTHER SYMPTOMS: "Do you have any symptoms?" (e.g., headache, chest pain, blurred vision, difficulty breathing, weakness)    Blurry vision looked like guardrails are running into, just wears reading glasses with computer  7. PREGNANCY: "Is there any chance you are pregnant?" "When was your last menstrual period?"     No tubes are tied, last cycle was last week  Protocols used: HIGH BLOOD PRESSURE-A-AH

## 2018-06-26 NOTE — Telephone Encounter (Signed)
Spoke with Surveyor, mining) at [facility] on [address].  Per Lenda Kelp is in stock at her pharmacy.  She stated that [facility] [place] [facility] out of their queue and then [facility] [address].    Silva Bandy will reach out to patient and contact her to let her know that rx has been filled there.

## 2018-10-25 ENCOUNTER — Other Ambulatory Visit: Payer: Self-pay | Admitting: Family Medicine

## 2018-10-27 ENCOUNTER — Other Ambulatory Visit: Payer: Self-pay | Admitting: Family Medicine

## 2018-11-08 NOTE — Telephone Encounter (Signed)
Med list has combo pill for Losartan-HCT, need to confirm with pt. Called pt and left VM to call the office.  

## 2018-11-08 NOTE — Telephone Encounter (Signed)
Med list has combo pill for Losartan-HCT, need to confirm with pt. Called pt and left VM to call the office.

## 2018-11-22 HISTORY — PX: BREAST BIOPSY: SHX5072

## 2018-12-01 ENCOUNTER — Ambulatory Visit (HOSPITAL_BASED_OUTPATIENT_CLINIC_OR_DEPARTMENT_OTHER): Admit: 2018-12-01 | Discharge: 2018-12-01 | Disposition: A | Discharge status: Home / Self Care | Payer: Self-pay

## 2018-12-04 ENCOUNTER — Encounter (HOSPITAL_BASED_OUTPATIENT_CLINIC_OR_DEPARTMENT_OTHER): Payer: Self-pay

## 2018-12-04 ENCOUNTER — Other Ambulatory Visit: Payer: Self-pay | Admitting: Surgery

## 2018-12-04 ENCOUNTER — Telehealth (HOSPITAL_BASED_OUTPATIENT_CLINIC_OR_DEPARTMENT_OTHER): Payer: Commercial Managed Care - PPO

## 2018-12-11 ENCOUNTER — Other Ambulatory Visit (HOSPITAL_BASED_OUTPATIENT_CLINIC_OR_DEPARTMENT_OTHER): Payer: Self-pay | Admitting: Surgery

## 2018-12-11 ENCOUNTER — Other Ambulatory Visit
Admit: 2018-12-11 | Discharge: 2018-12-11 | Disposition: A | Discharge status: Home / Self Care | Payer: Commercial Managed Care - PPO

## 2018-12-11 DIAGNOSIS — C50912 Malignant neoplasm of unspecified site of left female breast: Secondary | ICD-10-CM

## 2018-12-11 LAB — PATHOLOGY, SURGICAL

## 2018-12-12 ENCOUNTER — Ambulatory Visit: Payer: Commercial Managed Care - PPO | Attending: Medical Oncology | Admitting: Medical Oncology

## 2018-12-12 ENCOUNTER — Ambulatory Visit (HOSPITAL_BASED_OUTPATIENT_CLINIC_OR_DEPARTMENT_OTHER): Payer: Commercial Managed Care - PPO

## 2018-12-12 ENCOUNTER — Encounter (HOSPITAL_BASED_OUTPATIENT_CLINIC_OR_DEPARTMENT_OTHER): Payer: Self-pay | Admitting: Medical Oncology

## 2018-12-12 ENCOUNTER — Encounter (HOSPITAL_BASED_OUTPATIENT_CLINIC_OR_DEPARTMENT_OTHER): Payer: Self-pay | Admitting: Surgery

## 2018-12-12 DIAGNOSIS — Z86001 Personal history of in-situ neoplasm of cervix uteri: Secondary | ICD-10-CM | POA: Insufficient documentation

## 2018-12-12 DIAGNOSIS — N926 Irregular menstruation, unspecified: Secondary | ICD-10-CM | POA: Insufficient documentation

## 2018-12-12 DIAGNOSIS — Z9089 Acquired absence of other organs: Secondary | ICD-10-CM | POA: Insufficient documentation

## 2018-12-12 DIAGNOSIS — C50812 Malignant neoplasm of overlapping sites of left female breast: Secondary | ICD-10-CM | POA: Insufficient documentation

## 2018-12-12 DIAGNOSIS — Z170 Estrogen receptor positive status [ER+]: Secondary | ICD-10-CM | POA: Insufficient documentation

## 2018-12-12 DIAGNOSIS — C50919 Malignant neoplasm of unspecified site of unspecified female breast: Secondary | ICD-10-CM

## 2018-12-12 DIAGNOSIS — C773 Secondary and unspecified malignant neoplasm of axilla and upper limb lymph nodes: Secondary | ICD-10-CM | POA: Insufficient documentation

## 2018-12-12 DIAGNOSIS — N951 Menopausal and female climacteric states: Secondary | ICD-10-CM | POA: Insufficient documentation

## 2018-12-12 DIAGNOSIS — C50412 Malignant neoplasm of upper-outer quadrant of left female breast: Secondary | ICD-10-CM

## 2018-12-21 ENCOUNTER — Telehealth: Payer: Self-pay

## 2018-12-21 NOTE — Telephone Encounter (Signed)
Left detailed voicemail message on patient's cell phone.  Checking in with patient to see how she is doing with her blood pressure since it has been more than 6 mos since we last saw her.  Requested a call back from patient to follow up.

## 2018-12-22 ENCOUNTER — Other Ambulatory Visit
Admit: 2018-12-22 | Discharge: 2018-12-22 | Disposition: A | Discharge status: Home / Self Care | Payer: Commercial Managed Care - PPO

## 2018-12-22 ENCOUNTER — Other Ambulatory Visit (HOSPITAL_BASED_OUTPATIENT_CLINIC_OR_DEPARTMENT_OTHER): Payer: Self-pay | Admitting: Medical Oncology

## 2018-12-22 DIAGNOSIS — N6081 Other benign mammary dysplasias of right breast: Secondary | ICD-10-CM

## 2018-12-25 LAB — PATHOLOGY, SURGICAL

## 2018-12-29 ENCOUNTER — Ambulatory Visit (HOSPITAL_BASED_OUTPATIENT_CLINIC_OR_DEPARTMENT_OTHER): Payer: Commercial Managed Care - PPO

## 2018-12-29 ENCOUNTER — Ambulatory Visit: Payer: Commercial Managed Care - PPO | Attending: Medical Oncology

## 2018-12-29 DIAGNOSIS — Z5111 Encounter for antineoplastic chemotherapy: Secondary | ICD-10-CM | POA: Insufficient documentation

## 2018-12-29 DIAGNOSIS — C50812 Malignant neoplasm of overlapping sites of left female breast: Secondary | ICD-10-CM | POA: Insufficient documentation

## 2018-12-29 DIAGNOSIS — C50412 Malignant neoplasm of upper-outer quadrant of left female breast: Secondary | ICD-10-CM | POA: Insufficient documentation

## 2018-12-29 DIAGNOSIS — Z170 Estrogen receptor positive status [ER+]: Secondary | ICD-10-CM | POA: Insufficient documentation

## 2018-12-29 DIAGNOSIS — C773 Secondary and unspecified malignant neoplasm of axilla and upper limb lymph nodes: Secondary | ICD-10-CM | POA: Insufficient documentation

## 2018-12-29 LAB — BASIC METABOLIC PANEL
Anion Gap: 6 (ref 4–12)
Calcium: 9.1 mg/dL (ref 8.9–10.2)
Carbon Dioxide, Total: 28 meq/L (ref 22–32)
Chloride: 105 meq/L (ref 98–108)
Creatinine: 0.8 mg/dL (ref 0.38–1.02)
GFR, Calc, African American: >60 mL/min/{1.73_m2} (ref >59)
GFR, Calc, European American: >60 mL/min/{1.73_m2} (ref >59)
Glucose: 87 mg/dL (ref 62–125)
Potassium: 4 meq/L (ref 3.6–5.2)
Sodium: 139 meq/L (ref 135–145)
Urea Nitrogen: 11 mg/dL (ref 8–21)

## 2018-12-29 LAB — CBC, ABS NEUTROPHIL
Hematocrit: 36 % (ref 36–45)
Hemoglobin: 12 g/dL (ref 11.5–15.5)
Immature Granulocytes: 0.01 10*3/uL (ref 0.00–0.05)
MCH: 29.5 pg (ref 27.3–33.6)
MCHC: 33.6 g/dL (ref 32.2–36.5)
MCV: 88 fL (ref 81–98)
Neutrophils: 3.03 10*3/uL (ref 1.80–7.00)
Platelet Count: 205 10*3/uL (ref 150–400)
RBC: 4.07 10*6/uL (ref 3.80–5.00)
RDW-CV: 12.3 % (ref 11.6–14.4)
WBC: 4.36 10*3/uL (ref 4.3–10.0)

## 2018-12-29 LAB — HEPATIC FUNCTION PANEL W/ LD
ALT (GPT): 14 U/L (ref 7–33)
AST (GOT): 14 U/L (ref 9–38)
Albumin: 3.9 g/dL (ref 3.5–5.2)
Alkaline Phosphatase (Total): 18 U/L — ABNORMAL LOW (ref 34–121)
Bilirubin (Direct): 0.1 mg/dL (ref 0.0–0.3)
Bilirubin (Total): 0.5 mg/dL (ref 0.2–1.3)
Lactate Dehydrogenase: 134 U/L (ref <210)
Protein (Total): 6.3 g/dL (ref 6.0–8.2)

## 2018-12-29 LAB — PREGNANCY QUALITATIVE, SERUM: Pregnancy (Qual), SRM: NEGATIVE

## 2019-01-01 ENCOUNTER — Telehealth (HOSPITAL_BASED_OUTPATIENT_CLINIC_OR_DEPARTMENT_OTHER): Payer: Commercial Managed Care - PPO

## 2019-01-08 ENCOUNTER — Ambulatory Visit: Payer: Commercial Managed Care - PPO | Attending: Medical Oncology | Admitting: Medical Oncology

## 2019-01-08 DIAGNOSIS — Z170 Estrogen receptor positive status [ER+]: Secondary | ICD-10-CM | POA: Insufficient documentation

## 2019-01-08 DIAGNOSIS — C773 Secondary and unspecified malignant neoplasm of axilla and upper limb lymph nodes: Secondary | ICD-10-CM | POA: Insufficient documentation

## 2019-01-08 DIAGNOSIS — C50919 Malignant neoplasm of unspecified site of unspecified female breast: Secondary | ICD-10-CM

## 2019-01-08 DIAGNOSIS — Z9089 Acquired absence of other organs: Secondary | ICD-10-CM | POA: Insufficient documentation

## 2019-01-08 DIAGNOSIS — C50812 Malignant neoplasm of overlapping sites of left female breast: Secondary | ICD-10-CM | POA: Insufficient documentation

## 2019-01-09 ENCOUNTER — Encounter (HOSPITAL_BASED_OUTPATIENT_CLINIC_OR_DEPARTMENT_OTHER): Payer: Self-pay | Admitting: Medical Oncology

## 2019-01-12 ENCOUNTER — Ambulatory Visit (HOSPITAL_BASED_OUTPATIENT_CLINIC_OR_DEPARTMENT_OTHER): Payer: Commercial Managed Care - PPO

## 2019-01-12 ENCOUNTER — Ambulatory Visit: Payer: Commercial Managed Care - PPO | Attending: Medical Oncology

## 2019-01-12 DIAGNOSIS — C50412 Malignant neoplasm of upper-outer quadrant of left female breast: Secondary | ICD-10-CM | POA: Insufficient documentation

## 2019-01-12 DIAGNOSIS — R42 Dizziness and giddiness: Secondary | ICD-10-CM | POA: Insufficient documentation

## 2019-01-12 DIAGNOSIS — Z170 Estrogen receptor positive status [ER+]: Secondary | ICD-10-CM | POA: Insufficient documentation

## 2019-01-12 DIAGNOSIS — C773 Secondary and unspecified malignant neoplasm of axilla and upper limb lymph nodes: Secondary | ICD-10-CM | POA: Insufficient documentation

## 2019-01-12 DIAGNOSIS — Z5111 Encounter for antineoplastic chemotherapy: Secondary | ICD-10-CM | POA: Insufficient documentation

## 2019-01-12 DIAGNOSIS — C50812 Malignant neoplasm of overlapping sites of left female breast: Secondary | ICD-10-CM | POA: Insufficient documentation

## 2019-01-12 LAB — BASIC METABOLIC PANEL
Anion Gap: 8 (ref 4–12)
Calcium: 8.9 mg/dL (ref 8.9–10.2)
Carbon Dioxide, Total: 27 meq/L (ref 22–32)
Chloride: 105 meq/L (ref 98–108)
Creatinine: 0.85 mg/dL (ref 0.38–1.02)
GFR, Calc, African American: >60 mL/min/{1.73_m2} (ref >59)
GFR, Calc, European American: >60 mL/min/{1.73_m2} (ref >59)
Glucose: 129 mg/dL — ABNORMAL HIGH (ref 62–125)
Potassium: 3.6 meq/L (ref 3.6–5.2)
Sodium: 140 meq/L (ref 135–145)
Urea Nitrogen: 17 mg/dL (ref 8–21)

## 2019-01-12 LAB — CBC, ABS NEUTROPHIL
Hematocrit: 32 % — ABNORMAL LOW (ref 36–45)
Hemoglobin: 10.9 g/dL — ABNORMAL LOW (ref 11.5–15.5)
Immature Granulocytes: 0.69 10*3/uL — ABNORMAL HIGH (ref 0.00–0.05)
MCH: 29.9 pg (ref 27.3–33.6)
MCHC: 33.7 g/dL (ref 32.2–36.5)
MCV: 89 fL (ref 81–98)
Neutrophils: 2.82 10*3/uL (ref 1.80–7.00)
Platelet Count: 163 10*3/uL (ref 150–400)
RBC: 3.65 10*6/uL — ABNORMAL LOW (ref 3.80–5.00)
RDW-CV: 12 % (ref 11.6–14.4)
WBC: 5.32 10*3/uL (ref 4.3–10.0)

## 2019-01-12 LAB — HEPATIC FUNCTION PANEL W/ LD
ALT (GPT): 16 U/L (ref 7–33)
AST (GOT): 14 U/L (ref 9–38)
Albumin: 4 g/dL (ref 3.5–5.2)
Alkaline Phosphatase (Total): 34 U/L (ref 34–121)
Bilirubin (Direct): 0.1 mg/dL (ref 0.0–0.3)
Bilirubin (Total): 0.2 mg/dL (ref 0.2–1.3)
Lactate Dehydrogenase: 203 U/L (ref <210)
Protein (Total): 6.5 g/dL (ref 6.0–8.2)

## 2019-01-17 ENCOUNTER — Encounter (HOSPITAL_BASED_OUTPATIENT_CLINIC_OR_DEPARTMENT_OTHER): Payer: Self-pay

## 2019-01-24 ENCOUNTER — Ambulatory Visit: Payer: Commercial Managed Care - PPO | Attending: Physician Assistant | Admitting: Physician Assistant

## 2019-01-24 ENCOUNTER — Ambulatory Visit (HOSPITAL_BASED_OUTPATIENT_CLINIC_OR_DEPARTMENT_OTHER): Payer: Commercial Managed Care - PPO

## 2019-01-24 DIAGNOSIS — R5383 Other fatigue: Secondary | ICD-10-CM | POA: Insufficient documentation

## 2019-01-24 DIAGNOSIS — M79673 Pain in unspecified foot: Secondary | ICD-10-CM | POA: Insufficient documentation

## 2019-01-24 DIAGNOSIS — Z86001 Personal history of in-situ neoplasm of cervix uteri: Secondary | ICD-10-CM | POA: Insufficient documentation

## 2019-01-24 DIAGNOSIS — D72829 Elevated white blood cell count, unspecified: Secondary | ICD-10-CM | POA: Insufficient documentation

## 2019-01-24 DIAGNOSIS — Z79899 Other long term (current) drug therapy: Secondary | ICD-10-CM

## 2019-01-24 DIAGNOSIS — R110 Nausea: Secondary | ICD-10-CM | POA: Insufficient documentation

## 2019-01-24 DIAGNOSIS — Z08 Encounter for follow-up examination after completed treatment for malignant neoplasm: Secondary | ICD-10-CM

## 2019-01-24 DIAGNOSIS — Z9089 Acquired absence of other organs: Secondary | ICD-10-CM | POA: Insufficient documentation

## 2019-01-24 DIAGNOSIS — C50412 Malignant neoplasm of upper-outer quadrant of left female breast: Secondary | ICD-10-CM

## 2019-01-24 DIAGNOSIS — R51 Headache: Secondary | ICD-10-CM | POA: Insufficient documentation

## 2019-01-24 DIAGNOSIS — Z09 Encounter for follow-up examination after completed treatment for conditions other than malignant neoplasm: Secondary | ICD-10-CM

## 2019-01-24 DIAGNOSIS — C50812 Malignant neoplasm of overlapping sites of left female breast: Secondary | ICD-10-CM | POA: Insufficient documentation

## 2019-01-24 DIAGNOSIS — Z170 Estrogen receptor positive status [ER+]: Secondary | ICD-10-CM | POA: Insufficient documentation

## 2019-01-24 DIAGNOSIS — C773 Secondary and unspecified malignant neoplasm of axilla and upper limb lymph nodes: Secondary | ICD-10-CM | POA: Insufficient documentation

## 2019-01-24 LAB — BASIC METABOLIC PANEL
Anion Gap: 6 (ref 4–12)
Calcium: 9.2 mg/dL (ref 8.9–10.2)
Carbon Dioxide, Total: 29 meq/L (ref 22–32)
Chloride: 104 meq/L (ref 98–108)
Creatinine: 0.71 mg/dL (ref 0.38–1.02)
Glucose: 123 mg/dL (ref 62–125)
Potassium: 3.9 meq/L (ref 3.6–5.2)
Sodium: 139 meq/L (ref 135–145)
Urea Nitrogen: 13 mg/dL (ref 8–21)
eGFR by CKD-EPI: >60 mL/min/{1.73_m2} (ref >59)

## 2019-01-24 LAB — HEPATIC FUNCTION PANEL W/ LD
ALT (GPT): 18 U/L (ref 7–33)
AST (GOT): 15 U/L (ref 9–38)
Albumin: 4.1 g/dL (ref 3.5–5.2)
Alkaline Phosphatase (Total): 49 U/L (ref 34–121)
Bilirubin (Direct): 0.1 mg/dL (ref 0.0–0.3)
Bilirubin (Total): 0.2 mg/dL (ref 0.2–1.3)
Lactate Dehydrogenase: 279 U/L — ABNORMAL HIGH (ref <210)
Protein (Total): 6.5 g/dL (ref 6.0–8.2)

## 2019-01-24 LAB — CBC, ABS NEUTROPHIL
Hematocrit: 30 % — ABNORMAL LOW (ref 36–45)
Hemoglobin: 10 g/dL — ABNORMAL LOW (ref 11.5–15.5)
Immature Granulocytes: 2.33 10*3/uL — ABNORMAL HIGH (ref 0.00–0.05)
MCH: 29.4 pg (ref 27.3–33.6)
MCHC: 33.4 g/dL (ref 32.2–36.5)
MCV: 88 fL (ref 81–98)
Neutrophils: 9.43 10*3/uL — ABNORMAL HIGH (ref 1.80–7.00)
Platelet Count: 178 10*3/uL (ref 150–400)
RBC: 3.4 10*6/uL — ABNORMAL LOW (ref 3.80–5.00)
RDW-CV: 12.2 % (ref 11.6–14.4)
WBC: 13.68 10*3/uL — ABNORMAL HIGH (ref 4.3–10.0)

## 2019-01-26 ENCOUNTER — Ambulatory Visit: Payer: Commercial Managed Care - PPO | Attending: Medical Oncology

## 2019-01-26 ENCOUNTER — Encounter (HOSPITAL_BASED_OUTPATIENT_CLINIC_OR_DEPARTMENT_OTHER): Payer: Self-pay | Admitting: Physician Assistant

## 2019-01-26 DIAGNOSIS — R0602 Shortness of breath: Secondary | ICD-10-CM | POA: Insufficient documentation

## 2019-01-26 DIAGNOSIS — Z5111 Encounter for antineoplastic chemotherapy: Secondary | ICD-10-CM | POA: Insufficient documentation

## 2019-01-26 DIAGNOSIS — R531 Weakness: Secondary | ICD-10-CM | POA: Insufficient documentation

## 2019-01-26 DIAGNOSIS — C773 Secondary and unspecified malignant neoplasm of axilla and upper limb lymph nodes: Secondary | ICD-10-CM | POA: Insufficient documentation

## 2019-01-26 DIAGNOSIS — Z170 Estrogen receptor positive status [ER+]: Secondary | ICD-10-CM | POA: Insufficient documentation

## 2019-01-26 DIAGNOSIS — C50812 Malignant neoplasm of overlapping sites of left female breast: Secondary | ICD-10-CM | POA: Insufficient documentation

## 2019-02-07 ENCOUNTER — Ambulatory Visit (HOSPITAL_BASED_OUTPATIENT_CLINIC_OR_DEPARTMENT_OTHER): Payer: Commercial Managed Care - PPO

## 2019-02-07 ENCOUNTER — Encounter (HOSPITAL_BASED_OUTPATIENT_CLINIC_OR_DEPARTMENT_OTHER): Payer: Self-pay | Admitting: Medical Oncology

## 2019-02-07 ENCOUNTER — Other Ambulatory Visit: Payer: Self-pay | Admitting: Physician Assistant

## 2019-02-07 ENCOUNTER — Ambulatory Visit: Payer: Commercial Managed Care - PPO | Attending: Physician Assistant | Admitting: Medical Oncology

## 2019-02-07 DIAGNOSIS — C50412 Malignant neoplasm of upper-outer quadrant of left female breast: Secondary | ICD-10-CM | POA: Insufficient documentation

## 2019-02-07 DIAGNOSIS — C50212 Malignant neoplasm of upper-inner quadrant of left female breast: Secondary | ICD-10-CM

## 2019-02-07 DIAGNOSIS — C50919 Malignant neoplasm of unspecified site of unspecified female breast: Secondary | ICD-10-CM

## 2019-02-07 DIAGNOSIS — R110 Nausea: Secondary | ICD-10-CM

## 2019-02-07 LAB — CBC, ABS NEUTROPHIL
Hematocrit: 26 % — ABNORMAL LOW (ref 36–45)
Hemoglobin: 8.8 g/dL — ABNORMAL LOW (ref 11.5–15.5)
Immature Granulocytes: 3.24 10*3/uL — ABNORMAL HIGH (ref 0.00–0.05)
MCH: 30.1 pg (ref 27.3–33.6)
MCHC: 33.7 g/dL (ref 32.2–36.5)
MCV: 89 fL (ref 81–98)
Neutrophils: 7.82 10*3/uL — ABNORMAL HIGH (ref 1.80–7.00)
Platelet Count: 114 10*3/uL — ABNORMAL LOW (ref 150–400)
RBC: 2.92 10*6/uL — ABNORMAL LOW (ref 3.80–5.00)
RDW-CV: 14.1 % (ref 11.6–14.4)
WBC: 13.49 10*3/uL — ABNORMAL HIGH (ref 4.3–10.0)

## 2019-02-07 LAB — HEPATIC FUNCTION PANEL W/ LD
ALT (GPT): 18 U/L (ref 7–33)
AST (GOT): 15 U/L (ref 9–38)
Albumin: 3.8 g/dL (ref 3.5–5.2)
Alkaline Phosphatase (Total): 51 U/L (ref 34–121)
Bilirubin (Direct): 0.1 mg/dL (ref 0.0–0.3)
Bilirubin (Total): 0.2 mg/dL (ref 0.2–1.3)
Lactate Dehydrogenase: 250 U/L — ABNORMAL HIGH (ref <210)
Protein (Total): 6.2 g/dL (ref 6.0–8.2)

## 2019-02-07 LAB — BASIC METABOLIC PANEL
Anion Gap: 4 (ref 4–12)
Calcium: 9 mg/dL (ref 8.9–10.2)
Carbon Dioxide, Total: 30 meq/L (ref 22–32)
Chloride: 105 meq/L (ref 98–108)
Creatinine: 0.64 mg/dL (ref 0.38–1.02)
Glucose: 99 mg/dL (ref 62–125)
Potassium: 3.8 meq/L (ref 3.6–5.2)
Sodium: 139 meq/L (ref 135–145)
Urea Nitrogen: 7 mg/dL — ABNORMAL LOW (ref 8–21)
eGFR by CKD-EPI: >60 mL/min/{1.73_m2} (ref >59)

## 2019-02-09 ENCOUNTER — Encounter (HOSPITAL_BASED_OUTPATIENT_CLINIC_OR_DEPARTMENT_OTHER): Payer: Commercial Managed Care - PPO

## 2019-02-16 ENCOUNTER — Other Ambulatory Visit: Payer: Self-pay | Admitting: Physician Assistant

## 2019-02-16 ENCOUNTER — Ambulatory Visit (HOSPITAL_BASED_OUTPATIENT_CLINIC_OR_DEPARTMENT_OTHER): Payer: Commercial Managed Care - PPO | Admitting: Vascular & Interventional Radiology

## 2019-02-16 ENCOUNTER — Ambulatory Visit: Payer: Commercial Managed Care - PPO | Attending: Medical Oncology

## 2019-02-16 ENCOUNTER — Ambulatory Visit (HOSPITAL_BASED_OUTPATIENT_CLINIC_OR_DEPARTMENT_OTHER): Payer: Commercial Managed Care - PPO

## 2019-02-16 DIAGNOSIS — R110 Nausea: Secondary | ICD-10-CM | POA: Insufficient documentation

## 2019-02-16 DIAGNOSIS — C773 Secondary and unspecified malignant neoplasm of axilla and upper limb lymph nodes: Secondary | ICD-10-CM | POA: Insufficient documentation

## 2019-02-16 DIAGNOSIS — C50412 Malignant neoplasm of upper-outer quadrant of left female breast: Secondary | ICD-10-CM | POA: Insufficient documentation

## 2019-02-16 DIAGNOSIS — Z170 Estrogen receptor positive status [ER+]: Secondary | ICD-10-CM | POA: Insufficient documentation

## 2019-02-16 DIAGNOSIS — M25512 Pain in left shoulder: Secondary | ICD-10-CM | POA: Insufficient documentation

## 2019-02-16 DIAGNOSIS — Z452 Encounter for adjustment and management of vascular access device: Secondary | ICD-10-CM | POA: Insufficient documentation

## 2019-02-16 DIAGNOSIS — M25511 Pain in right shoulder: Secondary | ICD-10-CM | POA: Insufficient documentation

## 2019-02-16 DIAGNOSIS — C50812 Malignant neoplasm of overlapping sites of left female breast: Secondary | ICD-10-CM | POA: Insufficient documentation

## 2019-02-16 DIAGNOSIS — R239 Unspecified skin changes: Secondary | ICD-10-CM | POA: Insufficient documentation

## 2019-02-16 DIAGNOSIS — R079 Chest pain, unspecified: Secondary | ICD-10-CM

## 2019-02-16 DIAGNOSIS — M542 Cervicalgia: Secondary | ICD-10-CM | POA: Insufficient documentation

## 2019-02-16 LAB — COMP METABOLIC PANEL
ALT (GPT): 23 U/L (ref 7–33)
AST (GOT): 20 U/L (ref 9–38)
Albumin/Globulin Ratio: 1.6 (ref 1.0–2.4)
Albumin: 4 g/dL (ref 3.5–5.2)
Alkaline Phosphatase (Total): 25 U/L — ABNORMAL LOW (ref 34–121)
Anion Gap: 6 (ref 4–12)
Bilirubin (Total): 0.4 mg/dL (ref 0.2–1.3)
Calcium: 9.5 mg/dL (ref 8.9–10.2)
Carbon Dioxide, Total: 29 meq/L (ref 22–32)
Chloride: 105 meq/L (ref 98–108)
Creatinine: 0.73 mg/dL (ref 0.38–1.02)
Globulin: 2.5 g/dL (ref 2.0–3.9)
Glucose: 94 mg/dL (ref 62–125)
Potassium: 3.7 meq/L (ref 3.6–5.2)
Protein (Total): 6.5 g/dL (ref 6.0–8.2)
Sodium: 140 meq/L (ref 135–145)
Urea Nitrogen: 11 mg/dL (ref 8–21)
eGFR by CKD-EPI: >60 mL/min/{1.73_m2} (ref >59)

## 2019-02-16 LAB — CBC, ABS NEUTROPHIL
Hematocrit: 30 % — ABNORMAL LOW (ref 36–45)
Hemoglobin: 10 g/dL — ABNORMAL LOW (ref 11.5–15.5)
Immature Granulocytes: 0.01 10*3/uL (ref 0.00–0.05)
MCH: 30.3 pg (ref 27.3–33.6)
MCHC: 33.4 g/dL (ref 32.2–36.5)
MCV: 91 fL (ref 81–98)
Neutrophils: 1.66 10*3/uL — ABNORMAL LOW (ref 1.80–7.00)
Platelet Count: 275 10*3/uL (ref 150–400)
RBC: 3.3 10*6/uL — ABNORMAL LOW (ref 3.80–5.00)
RDW-CV: 16.7 % — ABNORMAL HIGH (ref 11.6–14.4)
WBC: 2.7 10*3/uL — ABNORMAL LOW (ref 4.3–10.0)

## 2019-02-16 LAB — IRON BINDING CAPACITY (W/IRON, TRANSFERRIN & TRANSF SAT)
Iron, SRM: 62 ug/dL (ref 31–171)
Total Iron Binding Capacity: 332 ug/dL (ref 270–535)
Transferrin Saturation: 19 % (ref 10–45)
Transferrin: 237 mg/dL (ref 192–382)

## 2019-02-16 LAB — FERRITIN: Ferritin: 142 ng/mL (ref 10–180)

## 2019-02-16 LAB — FOLATE & VITAMIN B12
Folate, SRM: 12.6 ng/mL (ref >5.8)
Vitamin B12 (Cobalamin): >1500 pg/mL — ABNORMAL HIGH (ref 180–914)

## 2019-02-17 ENCOUNTER — Ambulatory Visit: Payer: Commercial Managed Care - PPO | Attending: Physician Assistant

## 2019-02-17 DIAGNOSIS — C773 Secondary and unspecified malignant neoplasm of axilla and upper limb lymph nodes: Secondary | ICD-10-CM | POA: Insufficient documentation

## 2019-02-17 DIAGNOSIS — Z170 Estrogen receptor positive status [ER+]: Secondary | ICD-10-CM | POA: Insufficient documentation

## 2019-02-17 DIAGNOSIS — Z5111 Encounter for antineoplastic chemotherapy: Secondary | ICD-10-CM | POA: Insufficient documentation

## 2019-02-17 DIAGNOSIS — C50812 Malignant neoplasm of overlapping sites of left female breast: Secondary | ICD-10-CM | POA: Insufficient documentation

## 2019-02-19 ENCOUNTER — Telehealth (HOSPITAL_BASED_OUTPATIENT_CLINIC_OR_DEPARTMENT_OTHER): Payer: Commercial Managed Care - PPO

## 2019-02-21 ENCOUNTER — Ambulatory Visit: Payer: Commercial Managed Care - PPO | Attending: Medical Oncology

## 2019-02-21 ENCOUNTER — Other Ambulatory Visit: Payer: Self-pay | Admitting: Physician Assistant

## 2019-02-21 ENCOUNTER — Ambulatory Visit (HOSPITAL_BASED_OUTPATIENT_CLINIC_OR_DEPARTMENT_OTHER): Payer: Commercial Managed Care - PPO

## 2019-02-21 DIAGNOSIS — R918 Other nonspecific abnormal finding of lung field: Secondary | ICD-10-CM | POA: Insufficient documentation

## 2019-02-21 DIAGNOSIS — C50919 Malignant neoplasm of unspecified site of unspecified female breast: Secondary | ICD-10-CM

## 2019-03-05 ENCOUNTER — Ambulatory Visit: Payer: Commercial Managed Care - PPO

## 2019-03-07 ENCOUNTER — Ambulatory Visit (HOSPITAL_BASED_OUTPATIENT_CLINIC_OR_DEPARTMENT_OTHER): Payer: Commercial Managed Care - PPO

## 2019-03-07 ENCOUNTER — Ambulatory Visit: Payer: Commercial Managed Care - PPO | Attending: Physician Assistant | Admitting: Physician Assistant

## 2019-03-07 DIAGNOSIS — C50412 Malignant neoplasm of upper-outer quadrant of left female breast: Secondary | ICD-10-CM

## 2019-03-07 DIAGNOSIS — M25551 Pain in right hip: Secondary | ICD-10-CM | POA: Insufficient documentation

## 2019-03-07 DIAGNOSIS — R5383 Other fatigue: Secondary | ICD-10-CM

## 2019-03-07 DIAGNOSIS — Z9089 Acquired absence of other organs: Secondary | ICD-10-CM | POA: Insufficient documentation

## 2019-03-07 DIAGNOSIS — Z170 Estrogen receptor positive status [ER+]: Secondary | ICD-10-CM | POA: Insufficient documentation

## 2019-03-07 DIAGNOSIS — C773 Secondary and unspecified malignant neoplasm of axilla and upper limb lymph nodes: Secondary | ICD-10-CM | POA: Insufficient documentation

## 2019-03-07 DIAGNOSIS — C50812 Malignant neoplasm of overlapping sites of left female breast: Secondary | ICD-10-CM | POA: Insufficient documentation

## 2019-03-07 DIAGNOSIS — Z08 Encounter for follow-up examination after completed treatment for malignant neoplasm: Secondary | ICD-10-CM

## 2019-03-07 DIAGNOSIS — C50919 Malignant neoplasm of unspecified site of unspecified female breast: Secondary | ICD-10-CM

## 2019-03-07 LAB — CBC, ABS NEUTROPHIL
Hematocrit: 27 % — ABNORMAL LOW (ref 36–45)
Hemoglobin: 9.1 g/dL — ABNORMAL LOW (ref 11.5–15.5)
Immature Granulocytes: 0.02 10*3/uL (ref 0.00–0.05)
MCH: 31.1 pg (ref 27.3–33.6)
MCHC: 33.8 g/dL (ref 32.2–36.5)
MCV: 92 fL (ref 81–98)
Neutrophils: 1.46 10*3/uL — ABNORMAL LOW (ref 1.80–7.00)
Platelet Count: 257 10*3/uL (ref 150–400)
RBC: 2.93 10*6/uL — ABNORMAL LOW (ref 3.80–5.00)
RDW-CV: 17.2 % — ABNORMAL HIGH (ref 11.6–14.4)
WBC: 2.43 10*3/uL — ABNORMAL LOW (ref 4.3–10.0)

## 2019-03-07 LAB — HEPATIC FUNCTION PANEL W/ LD
ALT (GPT): 14 U/L (ref 7–33)
AST (GOT): 15 U/L (ref 9–38)
Albumin: 4.3 g/dL (ref 3.5–5.2)
Alkaline Phosphatase (Total): 24 U/L — ABNORMAL LOW (ref 34–121)
Bilirubin (Direct): 0.1 mg/dL (ref 0.0–0.3)
Bilirubin (Total): 0.3 mg/dL (ref 0.2–1.3)
Lactate Dehydrogenase: 148 U/L (ref <210)
Protein (Total): 6.4 g/dL (ref 6.0–8.2)

## 2019-03-07 LAB — BASIC METABOLIC PANEL
Anion Gap: 6 (ref 4–12)
Calcium: 9.4 mg/dL (ref 8.9–10.2)
Carbon Dioxide, Total: 30 meq/L (ref 22–32)
Chloride: 103 meq/L (ref 98–108)
Creatinine: 0.7 mg/dL (ref 0.38–1.02)
Glucose: 94 mg/dL (ref 62–125)
Potassium: 3.8 meq/L (ref 3.6–5.2)
Sodium: 139 meq/L (ref 135–145)
Urea Nitrogen: 12 mg/dL (ref 8–21)
eGFR by CKD-EPI: >60 mL/min/{1.73_m2} (ref >59)

## 2019-03-09 ENCOUNTER — Telehealth (HOSPITAL_BASED_OUTPATIENT_CLINIC_OR_DEPARTMENT_OTHER): Payer: Commercial Managed Care - PPO

## 2019-03-09 ENCOUNTER — Encounter (HOSPITAL_BASED_OUTPATIENT_CLINIC_OR_DEPARTMENT_OTHER): Payer: Self-pay | Admitting: Physician Assistant

## 2019-03-09 ENCOUNTER — Ambulatory Visit: Payer: Commercial Managed Care - PPO | Attending: Physician Assistant

## 2019-03-09 DIAGNOSIS — C773 Secondary and unspecified malignant neoplasm of axilla and upper limb lymph nodes: Secondary | ICD-10-CM | POA: Insufficient documentation

## 2019-03-09 DIAGNOSIS — Z170 Estrogen receptor positive status [ER+]: Secondary | ICD-10-CM | POA: Insufficient documentation

## 2019-03-09 DIAGNOSIS — Z5111 Encounter for antineoplastic chemotherapy: Secondary | ICD-10-CM | POA: Insufficient documentation

## 2019-03-09 DIAGNOSIS — R110 Nausea: Secondary | ICD-10-CM | POA: Insufficient documentation

## 2019-03-09 DIAGNOSIS — Z5112 Encounter for antineoplastic immunotherapy: Secondary | ICD-10-CM | POA: Insufficient documentation

## 2019-03-09 DIAGNOSIS — C50812 Malignant neoplasm of overlapping sites of left female breast: Secondary | ICD-10-CM | POA: Insufficient documentation

## 2019-03-12 ENCOUNTER — Telehealth (HOSPITAL_BASED_OUTPATIENT_CLINIC_OR_DEPARTMENT_OTHER): Payer: Commercial Managed Care - PPO

## 2019-03-16 ENCOUNTER — Ambulatory Visit: Payer: Commercial Managed Care - PPO | Attending: Physician Assistant

## 2019-03-16 ENCOUNTER — Ambulatory Visit (HOSPITAL_BASED_OUTPATIENT_CLINIC_OR_DEPARTMENT_OTHER): Payer: Commercial Managed Care - PPO

## 2019-03-16 DIAGNOSIS — C50919 Malignant neoplasm of unspecified site of unspecified female breast: Secondary | ICD-10-CM | POA: Insufficient documentation

## 2019-03-16 DIAGNOSIS — C50812 Malignant neoplasm of overlapping sites of left female breast: Secondary | ICD-10-CM | POA: Insufficient documentation

## 2019-03-16 DIAGNOSIS — Z5111 Encounter for antineoplastic chemotherapy: Secondary | ICD-10-CM | POA: Insufficient documentation

## 2019-03-16 DIAGNOSIS — Z170 Estrogen receptor positive status [ER+]: Secondary | ICD-10-CM | POA: Insufficient documentation

## 2019-03-16 DIAGNOSIS — C773 Secondary and unspecified malignant neoplasm of axilla and upper limb lymph nodes: Secondary | ICD-10-CM | POA: Insufficient documentation

## 2019-03-16 LAB — CBC, ABS NEUTROPHIL
Hematocrit: 27 % — ABNORMAL LOW (ref 36–45)
Hemoglobin: 9.7 g/dL — ABNORMAL LOW (ref 11.5–15.5)
Immature Granulocytes: 0.01 10*3/uL (ref 0.00–0.05)
MCH: 33 pg (ref 27.3–33.6)
MCHC: 35.9 g/dL (ref 32.2–36.5)
MCV: 92 fL (ref 81–98)
Neutrophils: 1.38 10*3/uL — ABNORMAL LOW (ref 1.80–7.00)
Platelet Count: 210 10*3/uL (ref 150–400)
RBC: 2.94 10*6/uL — ABNORMAL LOW (ref 3.80–5.00)
RDW-CV: 16.3 % — ABNORMAL HIGH (ref 11.6–14.4)
WBC: 2.2 10*3/uL — ABNORMAL LOW (ref 4.3–10.0)

## 2019-03-23 ENCOUNTER — Ambulatory Visit (HOSPITAL_BASED_OUTPATIENT_CLINIC_OR_DEPARTMENT_OTHER): Payer: Commercial Managed Care - PPO

## 2019-03-23 ENCOUNTER — Ambulatory Visit: Payer: Commercial Managed Care - PPO | Attending: Physician Assistant

## 2019-03-23 DIAGNOSIS — R5383 Other fatigue: Secondary | ICD-10-CM | POA: Insufficient documentation

## 2019-03-23 DIAGNOSIS — C773 Secondary and unspecified malignant neoplasm of axilla and upper limb lymph nodes: Secondary | ICD-10-CM | POA: Insufficient documentation

## 2019-03-23 DIAGNOSIS — C50812 Malignant neoplasm of overlapping sites of left female breast: Secondary | ICD-10-CM | POA: Insufficient documentation

## 2019-03-23 DIAGNOSIS — G5692 Unspecified mononeuropathy of left upper limb: Secondary | ICD-10-CM | POA: Insufficient documentation

## 2019-03-23 DIAGNOSIS — Z170 Estrogen receptor positive status [ER+]: Secondary | ICD-10-CM | POA: Insufficient documentation

## 2019-03-23 DIAGNOSIS — R300 Dysuria: Secondary | ICD-10-CM | POA: Insufficient documentation

## 2019-03-23 DIAGNOSIS — C50919 Malignant neoplasm of unspecified site of unspecified female breast: Secondary | ICD-10-CM | POA: Insufficient documentation

## 2019-03-23 LAB — URINALYSIS COMPLETE, URN
Bacteria, URN: NONE SEEN
Bilirubin (Qual), URN: NEGATIVE
Epith Cells_Renal/Trans,URN: NEGATIVE /HPF
Glucose Qual, URN: NEGATIVE mg/dL
Ketones, URN: NEGATIVE mg/dL
Nitrite, URN: NEGATIVE
Protein (Alb Semiquant), URN: NEGATIVE mg/dL
RBC, URN: NEGATIVE /HPF
Specific Gravity, URN: 1.01 g/mL (ref 1.006–1.027)
WBC, URN: NEGATIVE /HPF
pH, URN: 6 (ref 5.0–8.0)

## 2019-03-23 LAB — CBC, ABS NEUTROPHIL
Hematocrit: 28 % — ABNORMAL LOW (ref 36–45)
Hemoglobin: 9.6 g/dL — ABNORMAL LOW (ref 11.5–15.5)
Immature Granulocytes: 0.01 10*3/uL (ref 0.00–0.05)
MCH: 32.1 pg (ref 27.3–33.6)
MCHC: 34.5 g/dL (ref 32.2–36.5)
MCV: 93 fL (ref 81–98)
Neutrophils: 0.9 10*3/uL — ABNORMAL LOW (ref 1.80–7.00)
Platelet Count: 162 10*3/uL (ref 150–400)
RBC: 2.99 10*6/uL — ABNORMAL LOW (ref 3.80–5.00)
RDW-CV: 15.5 % — ABNORMAL HIGH (ref 11.6–14.4)
WBC: 1.76 10*3/uL — ABNORMAL LOW (ref 4.3–10.0)

## 2019-03-24 LAB — URINE C/S: Culture: 1000

## 2019-03-25 ENCOUNTER — Encounter (HOSPITAL_BASED_OUTPATIENT_CLINIC_OR_DEPARTMENT_OTHER): Payer: Commercial Managed Care - PPO

## 2019-03-28 ENCOUNTER — Ambulatory Visit: Payer: Commercial Managed Care - PPO | Attending: Medical Oncology | Admitting: Medical Oncology

## 2019-03-28 ENCOUNTER — Ambulatory Visit: Payer: Commercial Managed Care - PPO | Attending: Medical Oncology

## 2019-03-28 ENCOUNTER — Ambulatory Visit (HOSPITAL_BASED_OUTPATIENT_CLINIC_OR_DEPARTMENT_OTHER): Payer: Commercial Managed Care - PPO

## 2019-03-28 ENCOUNTER — Encounter (HOSPITAL_BASED_OUTPATIENT_CLINIC_OR_DEPARTMENT_OTHER): Payer: Self-pay | Admitting: Unknown Physician Specialty

## 2019-03-28 ENCOUNTER — Other Ambulatory Visit (HOSPITAL_COMMUNITY): Payer: Self-pay

## 2019-03-28 DIAGNOSIS — C50812 Malignant neoplasm of overlapping sites of left female breast: Secondary | ICD-10-CM | POA: Insufficient documentation

## 2019-03-28 DIAGNOSIS — C50919 Malignant neoplasm of unspecified site of unspecified female breast: Secondary | ICD-10-CM | POA: Insufficient documentation

## 2019-03-28 DIAGNOSIS — D759 Disease of blood and blood-forming organs, unspecified: Secondary | ICD-10-CM | POA: Insufficient documentation

## 2019-03-28 DIAGNOSIS — D649 Anemia, unspecified: Secondary | ICD-10-CM | POA: Insufficient documentation

## 2019-03-28 DIAGNOSIS — Z170 Estrogen receptor positive status [ER+]: Secondary | ICD-10-CM | POA: Insufficient documentation

## 2019-03-28 DIAGNOSIS — C773 Secondary and unspecified malignant neoplasm of axilla and upper limb lymph nodes: Secondary | ICD-10-CM | POA: Insufficient documentation

## 2019-03-28 DIAGNOSIS — R197 Diarrhea, unspecified: Secondary | ICD-10-CM | POA: Insufficient documentation

## 2019-03-28 DIAGNOSIS — R5383 Other fatigue: Secondary | ICD-10-CM | POA: Insufficient documentation

## 2019-03-28 DIAGNOSIS — Z9089 Acquired absence of other organs: Secondary | ICD-10-CM | POA: Insufficient documentation

## 2019-03-28 DIAGNOSIS — D709 Neutropenia, unspecified: Secondary | ICD-10-CM | POA: Insufficient documentation

## 2019-03-28 DIAGNOSIS — Z86001 Personal history of in-situ neoplasm of cervix uteri: Secondary | ICD-10-CM | POA: Insufficient documentation

## 2019-03-28 LAB — CBC, ABS NEUTROPHIL
Hematocrit: 30 % — ABNORMAL LOW (ref 36–45)
Hemoglobin: 10.5 g/dL — ABNORMAL LOW (ref 11.5–15.5)
Immature Granulocytes: 0.02 10*3/uL (ref 0.00–0.05)
MCH: 32.4 pg (ref 27.3–33.6)
MCHC: 34.8 g/dL (ref 32.2–36.5)
MCV: 93 fL (ref 81–98)
Neutrophils: 1.82 10*3/uL (ref 1.80–7.00)
Platelet Count: 194 10*3/uL (ref 150–400)
RBC: 3.24 10*6/uL — ABNORMAL LOW (ref 3.80–5.00)
RDW-CV: 15.2 % — ABNORMAL HIGH (ref 11.6–14.4)
WBC: 2.91 10*3/uL — ABNORMAL LOW (ref 4.3–10.0)

## 2019-03-28 LAB — HEPATIC FUNCTION PANEL W/ LD
ALT (GPT): 33 U/L (ref 7–33)
AST (GOT): 21 U/L (ref 9–38)
Albumin: 4.1 g/dL (ref 3.5–5.2)
Alkaline Phosphatase (Total): 21 U/L — ABNORMAL LOW (ref 34–121)
Bilirubin (Direct): 0.1 mg/dL (ref 0.0–0.3)
Bilirubin (Total): 0.4 mg/dL (ref 0.2–1.3)
Lactate Dehydrogenase: 163 U/L (ref <210)
Protein (Total): 6.4 g/dL (ref 6.0–8.2)

## 2019-03-28 LAB — BASIC METABOLIC PANEL
Anion Gap: 8 (ref 4–12)
Calcium: 9.3 mg/dL (ref 8.9–10.2)
Carbon Dioxide, Total: 27 meq/L (ref 22–32)
Chloride: 106 meq/L (ref 98–108)
Creatinine: 0.69 mg/dL (ref 0.38–1.02)
Glucose: 82 mg/dL (ref 62–125)
Potassium: 3.9 meq/L (ref 3.6–5.2)
Sodium: 141 meq/L (ref 135–145)
Urea Nitrogen: 12 mg/dL (ref 8–21)
eGFR by CKD-EPI: >60 mL/min/{1.73_m2} (ref >59)

## 2019-03-28 NOTE — Progress Notes (Signed)
03/28/2019          The patient was referred by Dr. Charna Archer for Focused Transthoracic Echocardiogram.  Procedures performed and sent to Dr. Nicoletta Ba for interpretation of results.        Yolanda Bowers

## 2019-03-30 ENCOUNTER — Encounter (HOSPITAL_BASED_OUTPATIENT_CLINIC_OR_DEPARTMENT_OTHER): Payer: Commercial Managed Care - PPO

## 2019-03-30 ENCOUNTER — Ambulatory Visit: Payer: Commercial Managed Care - PPO | Attending: Medical Oncology

## 2019-03-30 ENCOUNTER — Other Ambulatory Visit (HOSPITAL_COMMUNITY): Payer: Self-pay | Admitting: Physician Assistant

## 2019-03-30 DIAGNOSIS — C50812 Malignant neoplasm of overlapping sites of left female breast: Secondary | ICD-10-CM | POA: Insufficient documentation

## 2019-03-30 DIAGNOSIS — Z170 Estrogen receptor positive status [ER+]: Secondary | ICD-10-CM | POA: Insufficient documentation

## 2019-03-30 DIAGNOSIS — Z5111 Encounter for antineoplastic chemotherapy: Secondary | ICD-10-CM | POA: Insufficient documentation

## 2019-03-30 DIAGNOSIS — R197 Diarrhea, unspecified: Secondary | ICD-10-CM | POA: Insufficient documentation

## 2019-03-30 DIAGNOSIS — C773 Secondary and unspecified malignant neoplasm of axilla and upper limb lymph nodes: Secondary | ICD-10-CM | POA: Insufficient documentation

## 2019-03-30 MED ORDER — FAMOTIDINE 20 MG OR TABS
ORAL_TABLET | ORAL | 0 refills | Status: DC
Start: 2019-03-30 — End: 2020-03-22

## 2019-04-06 ENCOUNTER — Ambulatory Visit: Payer: Commercial Managed Care - PPO | Attending: Physician Assistant

## 2019-04-06 ENCOUNTER — Ambulatory Visit (HOSPITAL_BASED_OUTPATIENT_CLINIC_OR_DEPARTMENT_OTHER): Payer: Commercial Managed Care - PPO

## 2019-04-06 ENCOUNTER — Encounter (HOSPITAL_BASED_OUTPATIENT_CLINIC_OR_DEPARTMENT_OTHER): Payer: Commercial Managed Care - PPO

## 2019-04-06 ENCOUNTER — Other Ambulatory Visit (HOSPITAL_BASED_OUTPATIENT_CLINIC_OR_DEPARTMENT_OTHER): Payer: Commercial Managed Care - PPO

## 2019-04-06 DIAGNOSIS — M25551 Pain in right hip: Secondary | ICD-10-CM | POA: Insufficient documentation

## 2019-04-06 DIAGNOSIS — R42 Dizziness and giddiness: Secondary | ICD-10-CM | POA: Insufficient documentation

## 2019-04-06 DIAGNOSIS — Z170 Estrogen receptor positive status [ER+]: Secondary | ICD-10-CM | POA: Insufficient documentation

## 2019-04-06 DIAGNOSIS — R197 Diarrhea, unspecified: Secondary | ICD-10-CM | POA: Insufficient documentation

## 2019-04-06 DIAGNOSIS — Z5112 Encounter for antineoplastic immunotherapy: Secondary | ICD-10-CM | POA: Insufficient documentation

## 2019-04-06 DIAGNOSIS — C773 Secondary and unspecified malignant neoplasm of axilla and upper limb lymph nodes: Secondary | ICD-10-CM | POA: Insufficient documentation

## 2019-04-06 DIAGNOSIS — Z5111 Encounter for antineoplastic chemotherapy: Secondary | ICD-10-CM | POA: Insufficient documentation

## 2019-04-06 DIAGNOSIS — C50919 Malignant neoplasm of unspecified site of unspecified female breast: Secondary | ICD-10-CM | POA: Insufficient documentation

## 2019-04-06 DIAGNOSIS — C50812 Malignant neoplasm of overlapping sites of left female breast: Secondary | ICD-10-CM | POA: Insufficient documentation

## 2019-04-06 LAB — CBC, ABS NEUTROPHIL
Hematocrit: 29 % — ABNORMAL LOW (ref 36–45)
Hemoglobin: 9.8 g/dL — ABNORMAL LOW (ref 11.5–15.5)
Immature Granulocytes: 0.01 10*3/uL (ref 0.00–0.05)
MCH: 31.3 pg (ref 27.3–33.6)
MCHC: 33.3 g/dL (ref 32.2–36.5)
MCV: 94 fL (ref 81–98)
Neutrophils: 1.51 10*3/uL — ABNORMAL LOW (ref 1.80–7.00)
Platelet Count: 177 10*3/uL (ref 150–400)
RBC: 3.13 10*6/uL — ABNORMAL LOW (ref 3.80–5.00)
RDW-CV: 13.6 % (ref 11.6–14.4)
WBC: 2.56 10*3/uL — ABNORMAL LOW (ref 4.3–10.0)

## 2019-04-13 ENCOUNTER — Ambulatory Visit (HOSPITAL_BASED_OUTPATIENT_CLINIC_OR_DEPARTMENT_OTHER): Payer: Commercial Managed Care - PPO

## 2019-04-13 ENCOUNTER — Ambulatory Visit: Payer: Commercial Managed Care - PPO | Attending: Medical Oncology

## 2019-04-13 ENCOUNTER — Other Ambulatory Visit (HOSPITAL_BASED_OUTPATIENT_CLINIC_OR_DEPARTMENT_OTHER): Payer: Commercial Managed Care - PPO

## 2019-04-13 DIAGNOSIS — C50812 Malignant neoplasm of overlapping sites of left female breast: Secondary | ICD-10-CM | POA: Insufficient documentation

## 2019-04-13 DIAGNOSIS — R5383 Other fatigue: Secondary | ICD-10-CM | POA: Insufficient documentation

## 2019-04-13 DIAGNOSIS — C50919 Malignant neoplasm of unspecified site of unspecified female breast: Secondary | ICD-10-CM | POA: Insufficient documentation

## 2019-04-13 DIAGNOSIS — R197 Diarrhea, unspecified: Secondary | ICD-10-CM | POA: Insufficient documentation

## 2019-04-13 DIAGNOSIS — K5900 Constipation, unspecified: Secondary | ICD-10-CM | POA: Insufficient documentation

## 2019-04-13 DIAGNOSIS — R0602 Shortness of breath: Secondary | ICD-10-CM | POA: Insufficient documentation

## 2019-04-13 DIAGNOSIS — N644 Mastodynia: Secondary | ICD-10-CM | POA: Insufficient documentation

## 2019-04-13 DIAGNOSIS — M2550 Pain in unspecified joint: Secondary | ICD-10-CM | POA: Insufficient documentation

## 2019-04-13 DIAGNOSIS — Z170 Estrogen receptor positive status [ER+]: Secondary | ICD-10-CM | POA: Insufficient documentation

## 2019-04-13 DIAGNOSIS — G5693 Unspecified mononeuropathy of bilateral upper limbs: Secondary | ICD-10-CM | POA: Insufficient documentation

## 2019-04-13 DIAGNOSIS — C773 Secondary and unspecified malignant neoplasm of axilla and upper limb lymph nodes: Secondary | ICD-10-CM | POA: Insufficient documentation

## 2019-04-13 DIAGNOSIS — Z5111 Encounter for antineoplastic chemotherapy: Secondary | ICD-10-CM | POA: Insufficient documentation

## 2019-04-13 LAB — HEPATIC FUNCTION PANEL W/ LD
ALT (GPT): 37 U/L — ABNORMAL HIGH (ref 7–33)
AST (GOT): 27 U/L (ref 9–38)
Albumin: 4.2 g/dL (ref 3.5–5.2)
Alkaline Phosphatase (Total): 28 U/L — ABNORMAL LOW (ref 34–121)
Bilirubin (Direct): 0.1 mg/dL (ref 0.0–0.3)
Bilirubin (Total): 0.4 mg/dL (ref 0.2–1.3)
Lactate Dehydrogenase: 146 U/L (ref <210)
Protein (Total): 6.6 g/dL (ref 6.0–8.2)

## 2019-04-13 LAB — CBC, ABS NEUTROPHIL
Hematocrit: 30 % — ABNORMAL LOW (ref 36–45)
Hemoglobin: 10.5 g/dL — ABNORMAL LOW (ref 11.5–15.5)
Immature Granulocytes: 0.03 10*3/uL (ref 0.00–0.05)
MCH: 32.4 pg (ref 27.3–33.6)
MCHC: 34.5 g/dL (ref 32.2–36.5)
MCV: 94 fL (ref 81–98)
Neutrophils: 2 10*3/uL (ref 1.80–7.00)
Platelet Count: 227 10*3/uL (ref 150–400)
RBC: 3.24 10*6/uL — ABNORMAL LOW (ref 3.80–5.00)
RDW-CV: 13.2 % (ref 11.6–14.4)
WBC: 3.24 10*3/uL — ABNORMAL LOW (ref 4.3–10.0)

## 2019-04-18 ENCOUNTER — Ambulatory Visit: Payer: Commercial Managed Care - PPO | Attending: Physician Assistant | Admitting: Physician Assistant

## 2019-04-18 ENCOUNTER — Encounter (HOSPITAL_BASED_OUTPATIENT_CLINIC_OR_DEPARTMENT_OTHER): Payer: Self-pay | Admitting: Physician Assistant

## 2019-04-18 ENCOUNTER — Ambulatory Visit (HOSPITAL_BASED_OUTPATIENT_CLINIC_OR_DEPARTMENT_OTHER): Payer: Commercial Managed Care - PPO

## 2019-04-18 DIAGNOSIS — C50812 Malignant neoplasm of overlapping sites of left female breast: Secondary | ICD-10-CM | POA: Insufficient documentation

## 2019-04-18 DIAGNOSIS — Z9089 Acquired absence of other organs: Secondary | ICD-10-CM | POA: Insufficient documentation

## 2019-04-18 DIAGNOSIS — C50919 Malignant neoplasm of unspecified site of unspecified female breast: Secondary | ICD-10-CM | POA: Insufficient documentation

## 2019-04-18 DIAGNOSIS — M25551 Pain in right hip: Secondary | ICD-10-CM | POA: Insufficient documentation

## 2019-04-18 DIAGNOSIS — Z79899 Other long term (current) drug therapy: Secondary | ICD-10-CM

## 2019-04-18 DIAGNOSIS — R0989 Other specified symptoms and signs involving the circulatory and respiratory systems: Secondary | ICD-10-CM | POA: Insufficient documentation

## 2019-04-18 DIAGNOSIS — H04203 Unspecified epiphora, bilateral lacrimal glands: Secondary | ICD-10-CM | POA: Insufficient documentation

## 2019-04-18 DIAGNOSIS — C773 Secondary and unspecified malignant neoplasm of axilla and upper limb lymph nodes: Secondary | ICD-10-CM | POA: Insufficient documentation

## 2019-04-18 DIAGNOSIS — Z08 Encounter for follow-up examination after completed treatment for malignant neoplasm: Secondary | ICD-10-CM

## 2019-04-18 DIAGNOSIS — R5383 Other fatigue: Secondary | ICD-10-CM

## 2019-04-18 DIAGNOSIS — R197 Diarrhea, unspecified: Secondary | ICD-10-CM | POA: Insufficient documentation

## 2019-04-18 DIAGNOSIS — R0602 Shortness of breath: Secondary | ICD-10-CM | POA: Insufficient documentation

## 2019-04-18 DIAGNOSIS — R202 Paresthesia of skin: Secondary | ICD-10-CM | POA: Insufficient documentation

## 2019-04-18 DIAGNOSIS — R040 Epistaxis: Secondary | ICD-10-CM | POA: Insufficient documentation

## 2019-04-18 DIAGNOSIS — Z170 Estrogen receptor positive status [ER+]: Secondary | ICD-10-CM | POA: Insufficient documentation

## 2019-04-18 DIAGNOSIS — Z09 Encounter for follow-up examination after completed treatment for conditions other than malignant neoplasm: Secondary | ICD-10-CM

## 2019-04-18 DIAGNOSIS — R51 Headache: Secondary | ICD-10-CM | POA: Insufficient documentation

## 2019-04-18 DIAGNOSIS — K521 Toxic gastroenteritis and colitis: Secondary | ICD-10-CM

## 2019-04-18 LAB — HEPATIC FUNCTION PANEL W/ LD
ALT (GPT): 30 U/L (ref 7–33)
AST (GOT): 24 U/L (ref 9–38)
Albumin: 4.2 g/dL (ref 3.5–5.2)
Alkaline Phosphatase (Total): 32 U/L — ABNORMAL LOW (ref 34–121)
Bilirubin (Direct): 0.1 mg/dL (ref 0.0–0.3)
Bilirubin (Total): 0.5 mg/dL (ref 0.2–1.3)
Lactate Dehydrogenase: 160 U/L (ref <210)
Protein (Total): 6.6 g/dL (ref 6.0–8.2)

## 2019-04-18 LAB — BASIC METABOLIC PANEL
Anion Gap: 7 (ref 4–12)
Calcium: 9.4 mg/dL (ref 8.9–10.2)
Carbon Dioxide, Total: 29 meq/L (ref 22–32)
Chloride: 103 meq/L (ref 98–108)
Creatinine: 0.62 mg/dL (ref 0.38–1.02)
Glucose: 89 mg/dL (ref 62–125)
Potassium: 3.7 meq/L (ref 3.6–5.2)
Sodium: 139 meq/L (ref 135–145)
Urea Nitrogen: 11 mg/dL (ref 8–21)
eGFR by CKD-EPI: >60 mL/min/{1.73_m2} (ref >59)

## 2019-04-18 LAB — CBC, ABS NEUTROPHIL
Hematocrit: 31 % — ABNORMAL LOW (ref 36–45)
Hemoglobin: 10.5 g/dL — ABNORMAL LOW (ref 11.5–15.5)
Immature Granulocytes: 0.01 10*3/uL (ref 0.00–0.05)
MCH: 31.4 pg (ref 27.3–33.6)
MCHC: 33.5 g/dL (ref 32.2–36.5)
MCV: 94 fL (ref 81–98)
Neutrophils: 1.7 10*3/uL — ABNORMAL LOW (ref 1.80–7.00)
Platelet Count: 213 10*3/uL (ref 150–400)
RBC: 3.34 10*6/uL — ABNORMAL LOW (ref 3.80–5.00)
RDW-CV: 13.2 % (ref 11.6–14.4)
WBC: 2.8 10*3/uL — ABNORMAL LOW (ref 4.3–10.0)

## 2019-04-20 ENCOUNTER — Encounter (HOSPITAL_BASED_OUTPATIENT_CLINIC_OR_DEPARTMENT_OTHER): Payer: Commercial Managed Care - PPO

## 2019-04-20 ENCOUNTER — Ambulatory Visit: Payer: Commercial Managed Care - PPO | Attending: Physician Assistant

## 2019-04-20 DIAGNOSIS — H539 Unspecified visual disturbance: Secondary | ICD-10-CM | POA: Insufficient documentation

## 2019-04-20 DIAGNOSIS — C773 Secondary and unspecified malignant neoplasm of axilla and upper limb lymph nodes: Secondary | ICD-10-CM | POA: Insufficient documentation

## 2019-04-20 DIAGNOSIS — R42 Dizziness and giddiness: Secondary | ICD-10-CM | POA: Insufficient documentation

## 2019-04-20 DIAGNOSIS — R197 Diarrhea, unspecified: Secondary | ICD-10-CM | POA: Insufficient documentation

## 2019-04-20 DIAGNOSIS — Z5111 Encounter for antineoplastic chemotherapy: Secondary | ICD-10-CM | POA: Insufficient documentation

## 2019-04-20 DIAGNOSIS — C50812 Malignant neoplasm of overlapping sites of left female breast: Secondary | ICD-10-CM | POA: Insufficient documentation

## 2019-04-20 DIAGNOSIS — Z170 Estrogen receptor positive status [ER+]: Secondary | ICD-10-CM | POA: Insufficient documentation

## 2019-04-27 ENCOUNTER — Other Ambulatory Visit (HOSPITAL_BASED_OUTPATIENT_CLINIC_OR_DEPARTMENT_OTHER): Payer: Commercial Managed Care - PPO

## 2019-04-27 ENCOUNTER — Ambulatory Visit: Payer: Commercial Managed Care - PPO | Attending: Physician Assistant

## 2019-04-27 ENCOUNTER — Ambulatory Visit (HOSPITAL_BASED_OUTPATIENT_CLINIC_OR_DEPARTMENT_OTHER): Payer: Commercial Managed Care - PPO

## 2019-04-27 ENCOUNTER — Encounter (HOSPITAL_BASED_OUTPATIENT_CLINIC_OR_DEPARTMENT_OTHER): Payer: Commercial Managed Care - PPO

## 2019-04-27 DIAGNOSIS — R5383 Other fatigue: Secondary | ICD-10-CM | POA: Insufficient documentation

## 2019-04-27 DIAGNOSIS — R040 Epistaxis: Secondary | ICD-10-CM | POA: Insufficient documentation

## 2019-04-27 DIAGNOSIS — Z5112 Encounter for antineoplastic immunotherapy: Secondary | ICD-10-CM | POA: Insufficient documentation

## 2019-04-27 DIAGNOSIS — Z5111 Encounter for antineoplastic chemotherapy: Secondary | ICD-10-CM | POA: Insufficient documentation

## 2019-04-27 DIAGNOSIS — R197 Diarrhea, unspecified: Secondary | ICD-10-CM | POA: Insufficient documentation

## 2019-04-27 DIAGNOSIS — C773 Secondary and unspecified malignant neoplasm of axilla and upper limb lymph nodes: Secondary | ICD-10-CM | POA: Insufficient documentation

## 2019-04-27 DIAGNOSIS — Z170 Estrogen receptor positive status [ER+]: Secondary | ICD-10-CM | POA: Insufficient documentation

## 2019-04-27 DIAGNOSIS — C50812 Malignant neoplasm of overlapping sites of left female breast: Secondary | ICD-10-CM | POA: Insufficient documentation

## 2019-04-27 DIAGNOSIS — C50919 Malignant neoplasm of unspecified site of unspecified female breast: Secondary | ICD-10-CM | POA: Insufficient documentation

## 2019-04-27 LAB — CBC, ABS NEUTROPHIL
Hematocrit: 30 % — ABNORMAL LOW (ref 36–45)
Hemoglobin: 10.2 g/dL — ABNORMAL LOW (ref 11.5–15.5)
Immature Granulocytes: 0.04 10*3/uL (ref 0.00–0.05)
MCH: 32 pg (ref 27.3–33.6)
MCHC: 33.9 g/dL (ref 32.2–36.5)
MCV: 94 fL (ref 81–98)
Neutrophils: 1.43 10*3/uL — ABNORMAL LOW (ref 1.80–7.00)
Platelet Count: 232 10*3/uL (ref 150–400)
RBC: 3.19 10*6/uL — ABNORMAL LOW (ref 3.80–5.00)
RDW-CV: 13 % (ref 11.6–14.4)
WBC: 2.82 10*3/uL — ABNORMAL LOW (ref 4.3–10.0)

## 2019-05-04 ENCOUNTER — Ambulatory Visit (HOSPITAL_BASED_OUTPATIENT_CLINIC_OR_DEPARTMENT_OTHER): Payer: Commercial Managed Care - PPO

## 2019-05-04 ENCOUNTER — Other Ambulatory Visit (HOSPITAL_BASED_OUTPATIENT_CLINIC_OR_DEPARTMENT_OTHER): Payer: Commercial Managed Care - PPO

## 2019-05-04 ENCOUNTER — Ambulatory Visit: Payer: Commercial Managed Care - PPO | Attending: Physician Assistant

## 2019-05-04 DIAGNOSIS — Z5111 Encounter for antineoplastic chemotherapy: Secondary | ICD-10-CM | POA: Insufficient documentation

## 2019-05-04 DIAGNOSIS — R197 Diarrhea, unspecified: Secondary | ICD-10-CM | POA: Insufficient documentation

## 2019-05-04 DIAGNOSIS — C773 Secondary and unspecified malignant neoplasm of axilla and upper limb lymph nodes: Secondary | ICD-10-CM | POA: Insufficient documentation

## 2019-05-04 DIAGNOSIS — R5383 Other fatigue: Secondary | ICD-10-CM | POA: Insufficient documentation

## 2019-05-04 DIAGNOSIS — C50919 Malignant neoplasm of unspecified site of unspecified female breast: Secondary | ICD-10-CM | POA: Insufficient documentation

## 2019-05-04 DIAGNOSIS — L819 Disorder of pigmentation, unspecified: Secondary | ICD-10-CM | POA: Insufficient documentation

## 2019-05-04 DIAGNOSIS — G5693 Unspecified mononeuropathy of bilateral upper limbs: Secondary | ICD-10-CM | POA: Insufficient documentation

## 2019-05-04 DIAGNOSIS — Z170 Estrogen receptor positive status [ER+]: Secondary | ICD-10-CM | POA: Insufficient documentation

## 2019-05-04 DIAGNOSIS — C50812 Malignant neoplasm of overlapping sites of left female breast: Secondary | ICD-10-CM | POA: Insufficient documentation

## 2019-05-04 DIAGNOSIS — R21 Rash and other nonspecific skin eruption: Secondary | ICD-10-CM | POA: Insufficient documentation

## 2019-05-04 DIAGNOSIS — R110 Nausea: Secondary | ICD-10-CM | POA: Insufficient documentation

## 2019-05-04 LAB — CBC, ABS NEUTROPHIL
Hematocrit: 30 % — ABNORMAL LOW (ref 36–45)
Hemoglobin: 10.1 g/dL — ABNORMAL LOW (ref 11.5–15.5)
Immature Granulocytes: 0.05 10*3/uL (ref 0.00–0.05)
MCH: 31.4 pg (ref 27.3–33.6)
MCHC: 33.3 g/dL (ref 32.2–36.5)
MCV: 94 fL (ref 81–98)
Neutrophils: 1.81 10*3/uL (ref 1.80–7.00)
Platelet Count: 226 10*3/uL (ref 150–400)
RBC: 3.22 10*6/uL — ABNORMAL LOW (ref 3.80–5.00)
RDW-CV: 13.3 % (ref 11.6–14.4)
WBC: 3.07 10*3/uL — ABNORMAL LOW (ref 4.3–10.0)

## 2019-05-08 ENCOUNTER — Encounter (HOSPITAL_BASED_OUTPATIENT_CLINIC_OR_DEPARTMENT_OTHER): Payer: Self-pay

## 2019-05-09 ENCOUNTER — Ambulatory Visit (HOSPITAL_BASED_OUTPATIENT_CLINIC_OR_DEPARTMENT_OTHER): Payer: Commercial Managed Care - PPO

## 2019-05-09 ENCOUNTER — Other Ambulatory Visit: Payer: Self-pay | Admitting: Physician Assistant

## 2019-05-09 ENCOUNTER — Encounter (HOSPITAL_BASED_OUTPATIENT_CLINIC_OR_DEPARTMENT_OTHER): Payer: Self-pay | Admitting: Medical Oncology

## 2019-05-09 ENCOUNTER — Ambulatory Visit: Payer: Commercial Managed Care - PPO | Attending: Physician Assistant | Admitting: Medical Oncology

## 2019-05-09 DIAGNOSIS — C50919 Malignant neoplasm of unspecified site of unspecified female breast: Secondary | ICD-10-CM

## 2019-05-09 DIAGNOSIS — C50812 Malignant neoplasm of overlapping sites of left female breast: Secondary | ICD-10-CM | POA: Insufficient documentation

## 2019-05-09 LAB — CBC, ABS NEUTROPHIL
Hematocrit: 31 % — ABNORMAL LOW (ref 36–45)
Hemoglobin: 10.3 g/dL — ABNORMAL LOW (ref 11.5–15.5)
Immature Granulocytes: 0.02 10*3/uL (ref 0.00–0.05)
MCH: 31.1 pg (ref 27.3–33.6)
MCHC: 32.8 g/dL (ref 32.2–36.5)
MCV: 95 fL (ref 81–98)
Neutrophils: 1.65 10*3/uL — ABNORMAL LOW (ref 1.80–7.00)
Platelet Count: 209 10*3/uL (ref 150–400)
RBC: 3.31 10*6/uL — ABNORMAL LOW (ref 3.80–5.00)
RDW-CV: 13.6 % (ref 11.6–14.4)
WBC: 2.52 10*3/uL — ABNORMAL LOW (ref 4.3–10.0)

## 2019-05-09 LAB — BASIC METABOLIC PANEL
Anion Gap: 5 (ref 4–12)
Calcium: 9.1 mg/dL (ref 8.9–10.2)
Carbon Dioxide, Total: 29 meq/L (ref 22–32)
Chloride: 104 meq/L (ref 98–108)
Creatinine: 0.7 mg/dL (ref 0.38–1.02)
Glucose: 110 mg/dL (ref 62–125)
Potassium: 4 meq/L (ref 3.6–5.2)
Sodium: 138 meq/L (ref 135–145)
Urea Nitrogen: 10 mg/dL (ref 8–21)
eGFR by CKD-EPI: >60 mL/min/{1.73_m2} (ref >59)

## 2019-05-09 LAB — HEPATIC FUNCTION PANEL W/ LD
ALT (GPT): 27 U/L (ref 7–33)
AST (GOT): 25 U/L (ref 9–38)
Albumin: 4 g/dL (ref 3.5–5.2)
Alkaline Phosphatase (Total): 33 U/L — ABNORMAL LOW (ref 34–121)
Bilirubin (Direct): 0.1 mg/dL (ref 0.0–0.3)
Bilirubin (Total): 0.5 mg/dL (ref 0.2–1.3)
Lactate Dehydrogenase: 171 U/L (ref <210)
Protein (Total): 6.3 g/dL (ref 6.0–8.2)

## 2019-05-11 ENCOUNTER — Encounter (HOSPITAL_BASED_OUTPATIENT_CLINIC_OR_DEPARTMENT_OTHER): Payer: Commercial Managed Care - PPO

## 2019-05-11 ENCOUNTER — Ambulatory Visit: Payer: Commercial Managed Care - PPO | Attending: Physician Assistant

## 2019-05-11 DIAGNOSIS — G5693 Unspecified mononeuropathy of bilateral upper limbs: Secondary | ICD-10-CM | POA: Insufficient documentation

## 2019-05-11 DIAGNOSIS — R21 Rash and other nonspecific skin eruption: Secondary | ICD-10-CM | POA: Insufficient documentation

## 2019-05-11 DIAGNOSIS — G5793 Unspecified mononeuropathy of bilateral lower limbs: Secondary | ICD-10-CM | POA: Insufficient documentation

## 2019-05-11 DIAGNOSIS — C773 Secondary and unspecified malignant neoplasm of axilla and upper limb lymph nodes: Secondary | ICD-10-CM | POA: Insufficient documentation

## 2019-05-11 DIAGNOSIS — R197 Diarrhea, unspecified: Secondary | ICD-10-CM | POA: Insufficient documentation

## 2019-05-11 DIAGNOSIS — Z5111 Encounter for antineoplastic chemotherapy: Secondary | ICD-10-CM | POA: Insufficient documentation

## 2019-05-11 DIAGNOSIS — C50812 Malignant neoplasm of overlapping sites of left female breast: Secondary | ICD-10-CM | POA: Insufficient documentation

## 2019-05-11 DIAGNOSIS — Z170 Estrogen receptor positive status [ER+]: Secondary | ICD-10-CM | POA: Insufficient documentation

## 2019-05-18 ENCOUNTER — Encounter (HOSPITAL_BASED_OUTPATIENT_CLINIC_OR_DEPARTMENT_OTHER): Payer: Commercial Managed Care - PPO

## 2019-05-18 ENCOUNTER — Other Ambulatory Visit (HOSPITAL_BASED_OUTPATIENT_CLINIC_OR_DEPARTMENT_OTHER): Payer: Commercial Managed Care - PPO

## 2019-05-18 ENCOUNTER — Ambulatory Visit: Payer: Commercial Managed Care - PPO | Attending: Physician Assistant

## 2019-05-18 ENCOUNTER — Ambulatory Visit (HOSPITAL_BASED_OUTPATIENT_CLINIC_OR_DEPARTMENT_OTHER): Payer: Commercial Managed Care - PPO

## 2019-05-18 DIAGNOSIS — C773 Secondary and unspecified malignant neoplasm of axilla and upper limb lymph nodes: Secondary | ICD-10-CM | POA: Insufficient documentation

## 2019-05-18 DIAGNOSIS — Z5112 Encounter for antineoplastic immunotherapy: Secondary | ICD-10-CM | POA: Insufficient documentation

## 2019-05-18 DIAGNOSIS — D709 Neutropenia, unspecified: Secondary | ICD-10-CM | POA: Insufficient documentation

## 2019-05-18 DIAGNOSIS — C50919 Malignant neoplasm of unspecified site of unspecified female breast: Secondary | ICD-10-CM | POA: Insufficient documentation

## 2019-05-18 DIAGNOSIS — C50412 Malignant neoplasm of upper-outer quadrant of left female breast: Secondary | ICD-10-CM | POA: Insufficient documentation

## 2019-05-18 DIAGNOSIS — Z170 Estrogen receptor positive status [ER+]: Secondary | ICD-10-CM | POA: Insufficient documentation

## 2019-05-18 DIAGNOSIS — Z5111 Encounter for antineoplastic chemotherapy: Secondary | ICD-10-CM | POA: Insufficient documentation

## 2019-05-18 LAB — CBC, ABS NEUTROPHIL
Hematocrit: 31 % — ABNORMAL LOW (ref 36–45)
Hemoglobin: 10.2 g/dL — ABNORMAL LOW (ref 11.5–15.5)
Immature Granulocytes: 0.08 10*3/uL — ABNORMAL HIGH (ref 0.00–0.05)
MCH: 31.5 pg (ref 27.3–33.6)
MCHC: 33 g/dL (ref 32.2–36.5)
MCV: 95 fL (ref 81–98)
Neutrophils: 1.4 10*3/uL — ABNORMAL LOW (ref 1.80–7.00)
Platelet Count: 211 10*3/uL (ref 150–400)
RBC: 3.24 10*6/uL — ABNORMAL LOW (ref 3.80–5.00)
RDW-CV: 13.9 % (ref 11.6–14.4)
WBC: 2.7 10*3/uL — ABNORMAL LOW (ref 4.3–10.0)

## 2019-05-18 IMAGING — MR MR CERVICAL SPINE W/O CM
6 series · 38 of 48 positions shown · non-contrast
Comparison: 05/15/2018 cervical spine CT

CLINICAL DATA: Neck pain with radicular symptoms down the right arm
for 3 weeks

EXAM:
MRI CERVICAL SPINE WITHOUT CONTRAST
TECHNIQUE: Multiplanar, multisequence MR imaging of the cervical spine was
performed. No intravenous contrast was administered.

[Series 2: T2 · sagittal · 3.0mm · 0.41mm/px · 5 of 13 slices shown (1 of 3)]
[im 1/13]
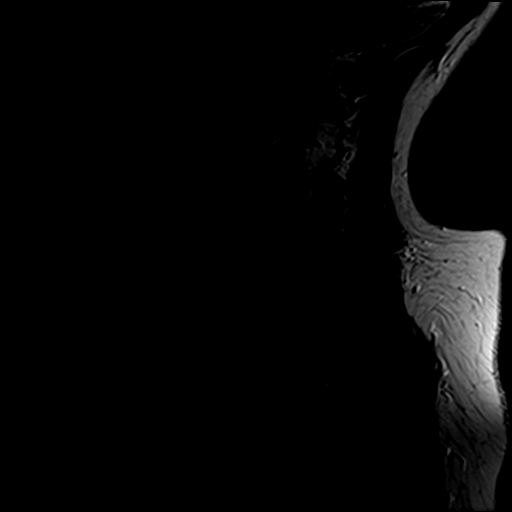
[im 4/13]
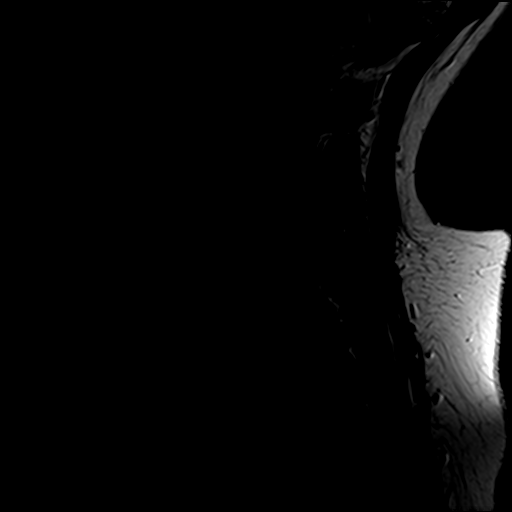
[im 7/13]
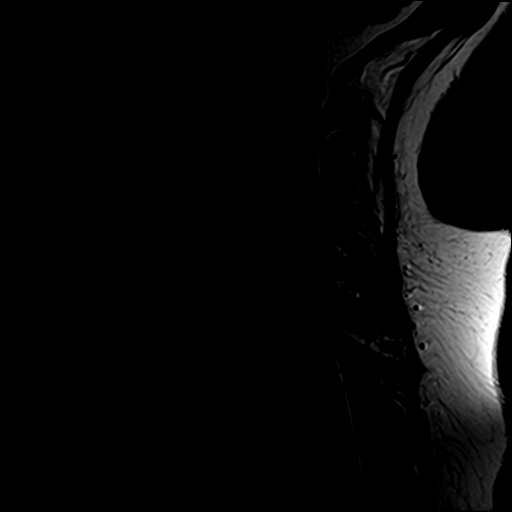
[im 10/13]
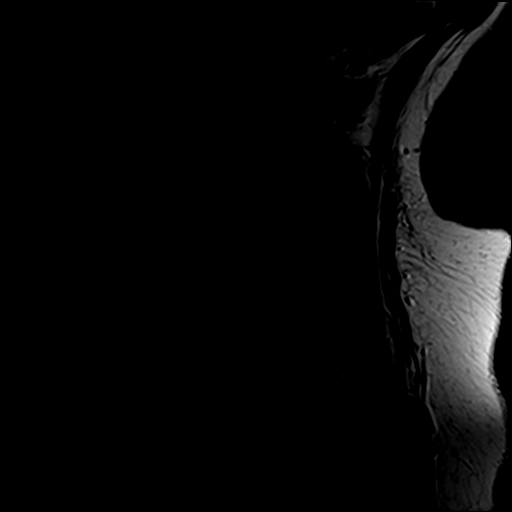
[im 13/13]
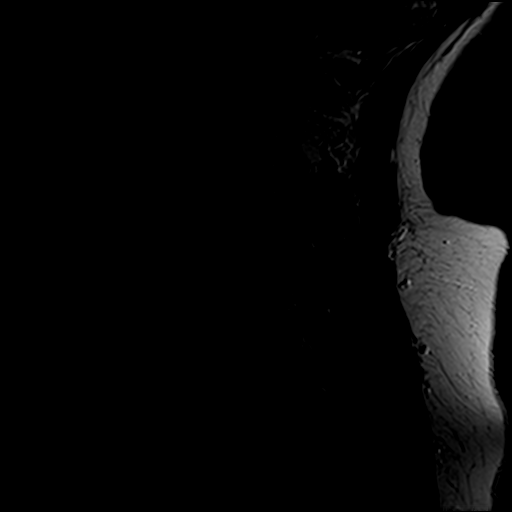

[Series 3: T1 · sagittal · 3.0mm · 0.41mm/px · 5 of 13 slices shown]
[im 1/13]
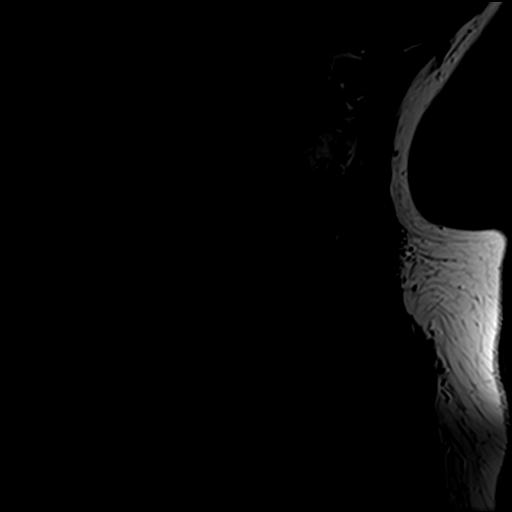
[im 4/13]
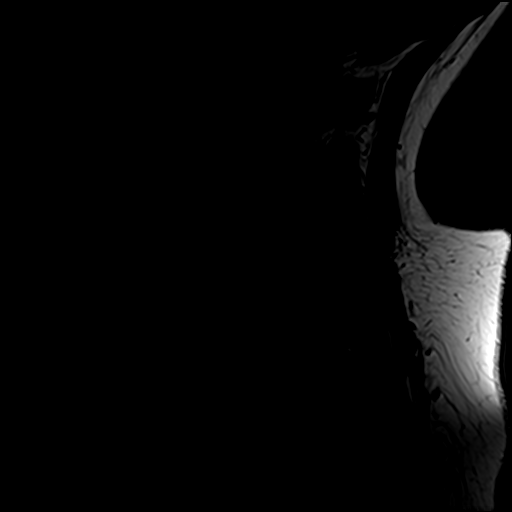
[im 7/13]
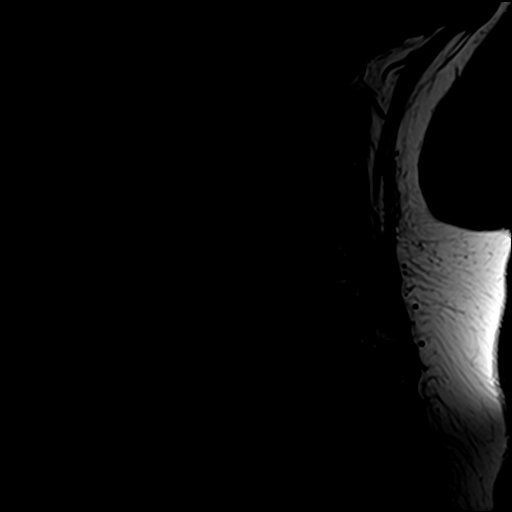
[im 10/13]
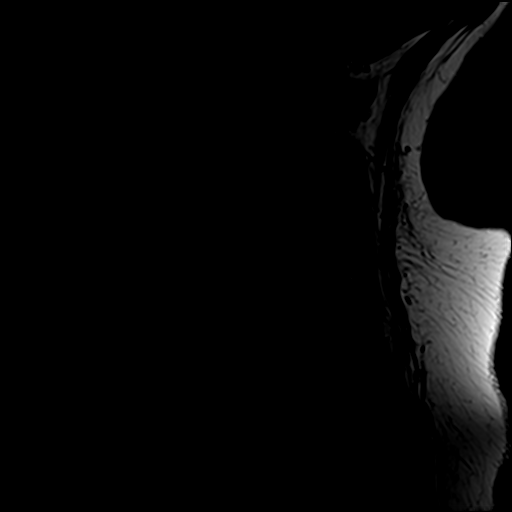
[im 13/13]
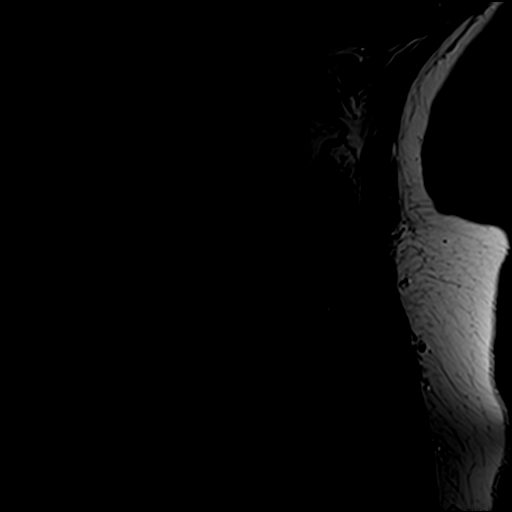

[Series 4: STIR · sagittal · 3.0mm · 0.82mm/px · 5 of 13 slices shown]
[im 1/13]
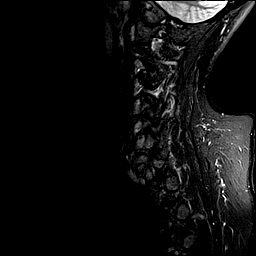
[im 4/13]
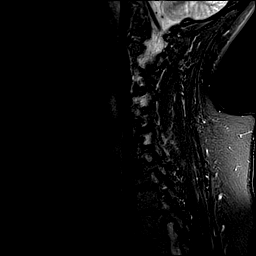
[im 7/13]
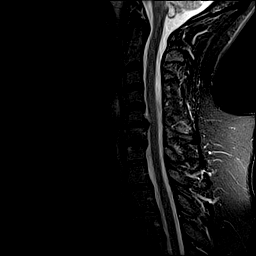
[im 10/13]
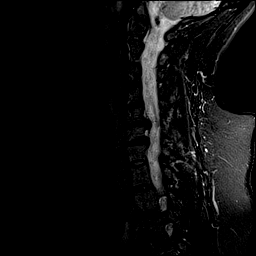
[im 13/13]
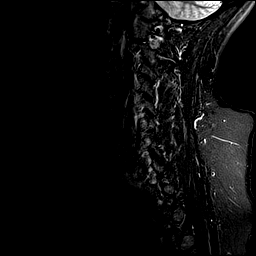

[Series 5: T2 · axial · 3.0mm · 0.70mm/px · z∈[-9,+91]mm · 11 of 28 slices shown (2 of 3)]
[im 1/28]
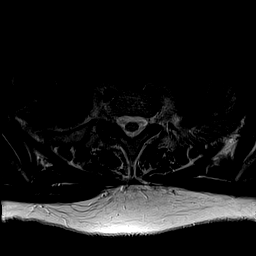
[im 3/28]
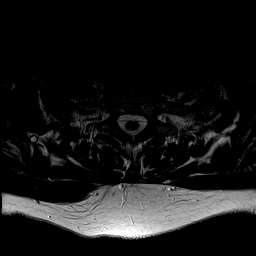
[im 6/28]
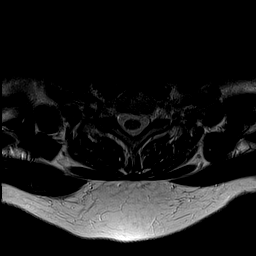
[im 9/28]
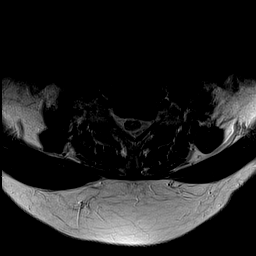
[im 11/28]
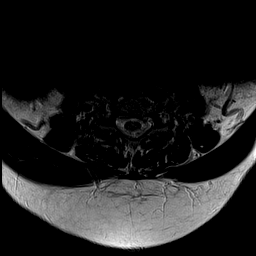
[im 14/28]
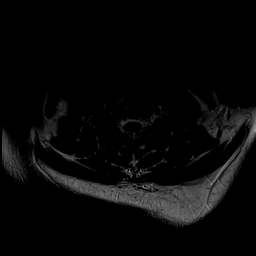
[im 17/28]
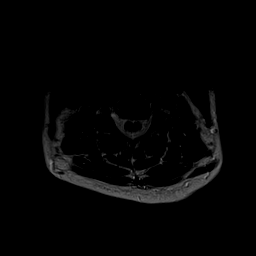
[im 19/28]
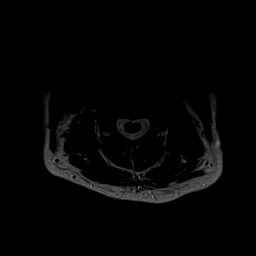
[im 22/28]
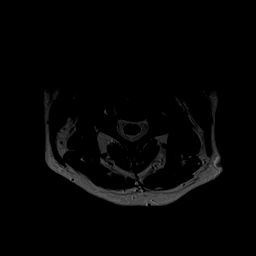
[im 25/28]
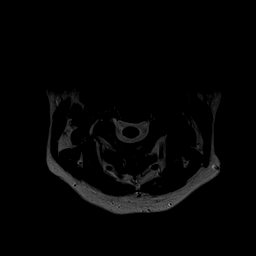
[im 28/28]
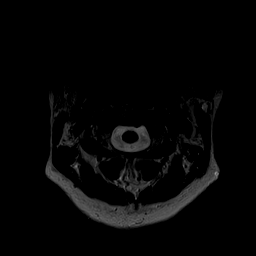

[Series 7: T2 · axial · 3.0mm · 0.70mm/px · z∈[-9,+91]mm · 11 of 28 slices shown (3 of 3)]
[im 1/28]
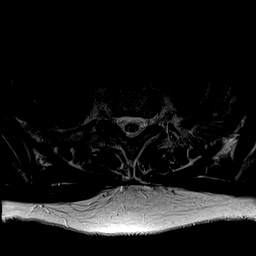
[im 3/28]
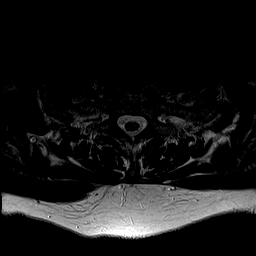
[im 6/28]
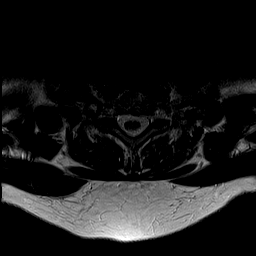
[im 9/28]
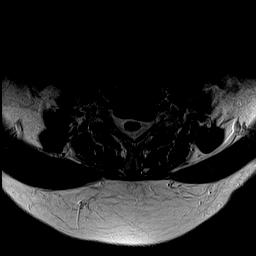
[im 11/28]
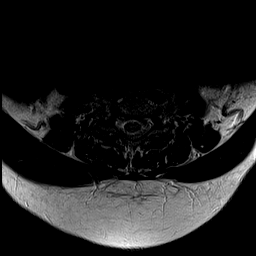
[im 14/28]
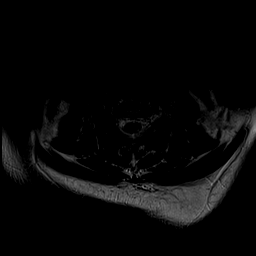
[im 17/28]
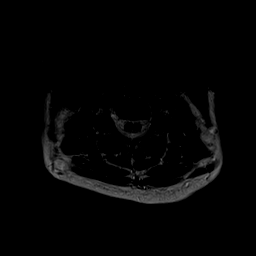
[im 19/28]
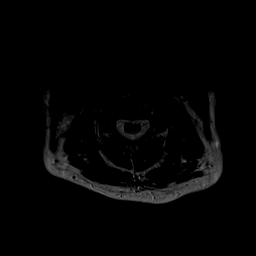
[im 22/28]
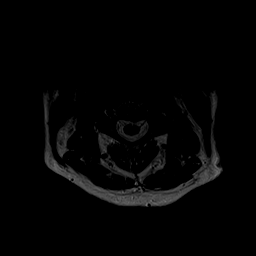
[im 25/28]
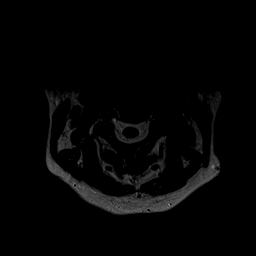
[im 28/28]
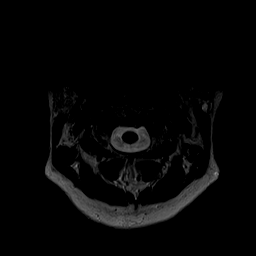

[Series 8: GRE · axial · 3.0mm · 0.35mm/px · 1 of 28 slices shown]
[im 1/28]
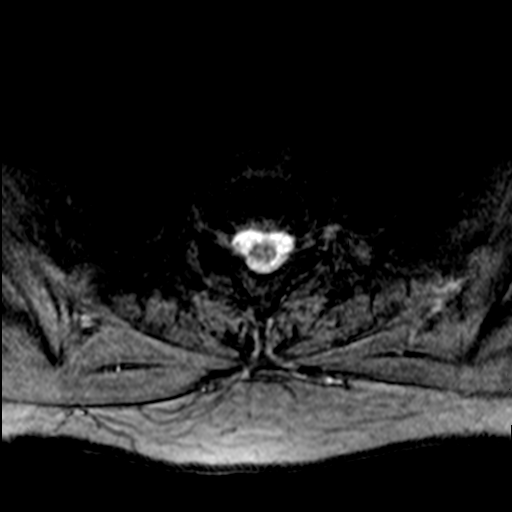

[38 of 48 positions shown; findings below may reference images not displayed]

FINDINGS: Alignment: Reversal of cervical lordosis.

Vertebrae: No fracture, evidence of discitis, or bone lesion.

Cord: Normal signal and morphology.

Posterior Fossa, vertebral arteries, paraspinal tissues: Negative.

Disc levels:

C2-3: Mild right-sided facet spurring

C3-4: Unremarkable.

C4-5: Small downward pointing disc protrusion on sagittal images. No
impingement

C5-6: Disc narrowing with bilateral paracentral disc herniation
impinging on the exiting C6 nerve root, greater on the right. No
cord impingement

C6-7: Disc narrowing with left paracentral protrusion and ridging.
Mild left foraminal narrowing. Patent spinal canal

C7-T1:Unremarkable.
IMPRESSION: 1. C5-6 bilateral paracentral protrusion with impingement on the
exiting C6 nerve roots, greater on the right.
2. C6-7 disc degeneration with protrusion and uncovertebral disease
causing mild left foraminal stenosis.
3. Diffusely patent spinal canal.

## 2019-05-25 ENCOUNTER — Ambulatory Visit: Payer: Commercial Managed Care - PPO | Attending: Physician Assistant

## 2019-05-25 ENCOUNTER — Ambulatory Visit (HOSPITAL_BASED_OUTPATIENT_CLINIC_OR_DEPARTMENT_OTHER): Payer: Commercial Managed Care - PPO

## 2019-05-25 DIAGNOSIS — K649 Unspecified hemorrhoids: Secondary | ICD-10-CM | POA: Insufficient documentation

## 2019-05-25 DIAGNOSIS — Z5111 Encounter for antineoplastic chemotherapy: Secondary | ICD-10-CM | POA: Insufficient documentation

## 2019-05-25 DIAGNOSIS — R109 Unspecified abdominal pain: Secondary | ICD-10-CM | POA: Insufficient documentation

## 2019-05-25 DIAGNOSIS — Z170 Estrogen receptor positive status [ER+]: Secondary | ICD-10-CM | POA: Insufficient documentation

## 2019-05-25 DIAGNOSIS — R110 Nausea: Secondary | ICD-10-CM | POA: Insufficient documentation

## 2019-05-25 DIAGNOSIS — R5383 Other fatigue: Secondary | ICD-10-CM | POA: Insufficient documentation

## 2019-05-25 DIAGNOSIS — R079 Chest pain, unspecified: Secondary | ICD-10-CM | POA: Insufficient documentation

## 2019-05-25 DIAGNOSIS — K5900 Constipation, unspecified: Secondary | ICD-10-CM | POA: Insufficient documentation

## 2019-05-25 DIAGNOSIS — L603 Nail dystrophy: Secondary | ICD-10-CM | POA: Insufficient documentation

## 2019-05-25 DIAGNOSIS — C50812 Malignant neoplasm of overlapping sites of left female breast: Secondary | ICD-10-CM | POA: Insufficient documentation

## 2019-05-25 DIAGNOSIS — M2550 Pain in unspecified joint: Secondary | ICD-10-CM | POA: Insufficient documentation

## 2019-05-25 DIAGNOSIS — C773 Secondary and unspecified malignant neoplasm of axilla and upper limb lymph nodes: Secondary | ICD-10-CM | POA: Insufficient documentation

## 2019-05-25 DIAGNOSIS — C50919 Malignant neoplasm of unspecified site of unspecified female breast: Secondary | ICD-10-CM | POA: Insufficient documentation

## 2019-05-25 DIAGNOSIS — R630 Anorexia: Secondary | ICD-10-CM | POA: Insufficient documentation

## 2019-05-25 DIAGNOSIS — R0981 Nasal congestion: Secondary | ICD-10-CM | POA: Insufficient documentation

## 2019-05-25 LAB — CBC, ABS NEUTROPHIL
Hematocrit: 30 % — ABNORMAL LOW (ref 36–45)
Hemoglobin: 9.9 g/dL — ABNORMAL LOW (ref 11.5–15.5)
Immature Granulocytes: 0.05 10*3/uL (ref 0.00–0.05)
MCH: 31.4 pg (ref 27.3–33.6)
MCHC: 33.1 g/dL (ref 32.2–36.5)
MCV: 95 fL (ref 81–98)
Neutrophils: 3.1 10*3/uL (ref 1.80–7.00)
Platelet Count: 222 10*3/uL (ref 150–400)
RBC: 3.15 10*6/uL — ABNORMAL LOW (ref 3.80–5.00)
RDW-CV: 14.4 % (ref 11.6–14.4)
WBC: 4.52 10*3/uL (ref 4.3–10.0)

## 2019-05-30 ENCOUNTER — Encounter (HOSPITAL_BASED_OUTPATIENT_CLINIC_OR_DEPARTMENT_OTHER): Payer: Commercial Managed Care - PPO | Admitting: Physician Assistant

## 2019-05-30 ENCOUNTER — Ambulatory Visit: Payer: Commercial Managed Care - PPO | Attending: Physician Assistant | Admitting: Physician Assistant

## 2019-05-30 DIAGNOSIS — C773 Secondary and unspecified malignant neoplasm of axilla and upper limb lymph nodes: Secondary | ICD-10-CM | POA: Insufficient documentation

## 2019-05-30 DIAGNOSIS — C50812 Malignant neoplasm of overlapping sites of left female breast: Secondary | ICD-10-CM | POA: Insufficient documentation

## 2019-05-30 DIAGNOSIS — R197 Diarrhea, unspecified: Secondary | ICD-10-CM | POA: Insufficient documentation

## 2019-05-30 DIAGNOSIS — R234 Changes in skin texture: Secondary | ICD-10-CM | POA: Insufficient documentation

## 2019-05-30 DIAGNOSIS — R110 Nausea: Secondary | ICD-10-CM | POA: Insufficient documentation

## 2019-05-30 DIAGNOSIS — K521 Toxic gastroenteritis and colitis: Secondary | ICD-10-CM

## 2019-05-30 DIAGNOSIS — R152 Fecal urgency: Secondary | ICD-10-CM | POA: Insufficient documentation

## 2019-05-30 DIAGNOSIS — Z9089 Acquired absence of other organs: Secondary | ICD-10-CM | POA: Insufficient documentation

## 2019-05-30 DIAGNOSIS — Z08 Encounter for follow-up examination after completed treatment for malignant neoplasm: Secondary | ICD-10-CM

## 2019-05-30 DIAGNOSIS — C50412 Malignant neoplasm of upper-outer quadrant of left female breast: Secondary | ICD-10-CM

## 2019-05-30 DIAGNOSIS — Z170 Estrogen receptor positive status [ER+]: Secondary | ICD-10-CM | POA: Insufficient documentation

## 2019-05-30 DIAGNOSIS — L609 Nail disorder, unspecified: Secondary | ICD-10-CM | POA: Insufficient documentation

## 2019-05-30 DIAGNOSIS — R5383 Other fatigue: Secondary | ICD-10-CM | POA: Insufficient documentation

## 2019-05-31 ENCOUNTER — Encounter (HOSPITAL_BASED_OUTPATIENT_CLINIC_OR_DEPARTMENT_OTHER): Payer: Self-pay | Admitting: Physician Assistant

## 2019-06-01 ENCOUNTER — Ambulatory Visit: Payer: Commercial Managed Care - PPO | Attending: Physician Assistant

## 2019-06-01 ENCOUNTER — Ambulatory Visit (HOSPITAL_BASED_OUTPATIENT_CLINIC_OR_DEPARTMENT_OTHER): Payer: Commercial Managed Care - PPO

## 2019-06-01 DIAGNOSIS — C50812 Malignant neoplasm of overlapping sites of left female breast: Secondary | ICD-10-CM | POA: Insufficient documentation

## 2019-06-01 DIAGNOSIS — C50919 Malignant neoplasm of unspecified site of unspecified female breast: Secondary | ICD-10-CM | POA: Insufficient documentation

## 2019-06-01 DIAGNOSIS — M7989 Other specified soft tissue disorders: Secondary | ICD-10-CM | POA: Insufficient documentation

## 2019-06-01 DIAGNOSIS — R200 Anesthesia of skin: Secondary | ICD-10-CM | POA: Insufficient documentation

## 2019-06-01 DIAGNOSIS — R197 Diarrhea, unspecified: Secondary | ICD-10-CM | POA: Insufficient documentation

## 2019-06-01 DIAGNOSIS — Z170 Estrogen receptor positive status [ER+]: Secondary | ICD-10-CM | POA: Insufficient documentation

## 2019-06-01 DIAGNOSIS — K5900 Constipation, unspecified: Secondary | ICD-10-CM | POA: Insufficient documentation

## 2019-06-01 DIAGNOSIS — R600 Localized edema: Secondary | ICD-10-CM | POA: Insufficient documentation

## 2019-06-01 DIAGNOSIS — Z5111 Encounter for antineoplastic chemotherapy: Secondary | ICD-10-CM | POA: Insufficient documentation

## 2019-06-01 DIAGNOSIS — C773 Secondary and unspecified malignant neoplasm of axilla and upper limb lymph nodes: Secondary | ICD-10-CM | POA: Insufficient documentation

## 2019-06-01 LAB — CBC, ABS NEUTROPHIL
Hematocrit: 31 % — ABNORMAL LOW (ref 36–45)
Hemoglobin: 10.3 g/dL — ABNORMAL LOW (ref 11.5–15.5)
Immature Granulocytes: 0.07 10*3/uL — ABNORMAL HIGH (ref 0.00–0.05)
MCH: 31.6 pg (ref 27.3–33.6)
MCHC: 33 g/dL (ref 32.2–36.5)
MCV: 96 fL (ref 81–98)
Neutrophils: 2.1 10*3/uL (ref 1.80–7.00)
Platelet Count: 220 10*3/uL (ref 150–400)
RBC: 3.26 10*6/uL — ABNORMAL LOW (ref 3.80–5.00)
RDW-CV: 14.7 % — ABNORMAL HIGH (ref 11.6–14.4)
WBC: 3.65 10*3/uL — ABNORMAL LOW (ref 4.3–10.0)

## 2019-06-08 ENCOUNTER — Ambulatory Visit: Payer: Commercial Managed Care - PPO | Attending: Physician Assistant | Admitting: Surgery

## 2019-06-08 ENCOUNTER — Ambulatory Visit (HOSPITAL_BASED_OUTPATIENT_CLINIC_OR_DEPARTMENT_OTHER): Payer: Commercial Managed Care - PPO

## 2019-06-08 ENCOUNTER — Other Ambulatory Visit: Payer: Self-pay | Admitting: Surgery

## 2019-06-08 ENCOUNTER — Encounter (HOSPITAL_BASED_OUTPATIENT_CLINIC_OR_DEPARTMENT_OTHER): Payer: Self-pay | Admitting: Surgery

## 2019-06-08 DIAGNOSIS — R197 Diarrhea, unspecified: Secondary | ICD-10-CM | POA: Insufficient documentation

## 2019-06-08 DIAGNOSIS — C50412 Malignant neoplasm of upper-outer quadrant of left female breast: Secondary | ICD-10-CM

## 2019-06-08 DIAGNOSIS — Z23 Encounter for immunization: Secondary | ICD-10-CM | POA: Insufficient documentation

## 2019-06-08 DIAGNOSIS — C773 Secondary and unspecified malignant neoplasm of axilla and upper limb lymph nodes: Secondary | ICD-10-CM | POA: Insufficient documentation

## 2019-06-08 DIAGNOSIS — R519 Headache, unspecified: Secondary | ICD-10-CM | POA: Insufficient documentation

## 2019-06-08 DIAGNOSIS — R42 Dizziness and giddiness: Secondary | ICD-10-CM | POA: Insufficient documentation

## 2019-06-08 DIAGNOSIS — C50919 Malignant neoplasm of unspecified site of unspecified female breast: Secondary | ICD-10-CM

## 2019-06-08 DIAGNOSIS — C50812 Malignant neoplasm of overlapping sites of left female breast: Secondary | ICD-10-CM

## 2019-06-08 DIAGNOSIS — Z170 Estrogen receptor positive status [ER+]: Secondary | ICD-10-CM | POA: Insufficient documentation

## 2019-06-08 DIAGNOSIS — G5793 Unspecified mononeuropathy of bilateral lower limbs: Secondary | ICD-10-CM | POA: Insufficient documentation

## 2019-06-08 DIAGNOSIS — Z5112 Encounter for antineoplastic immunotherapy: Secondary | ICD-10-CM | POA: Insufficient documentation

## 2019-06-14 ENCOUNTER — Telehealth (HOSPITAL_BASED_OUTPATIENT_CLINIC_OR_DEPARTMENT_OTHER): Payer: Self-pay | Admitting: Surgery

## 2019-06-14 NOTE — Telephone Encounter (Signed)
Contacted [facility] to inform her that I've been communicating with the plastics department and that we're working on coordinating a date for her which will depend on what the final surgical plan is (patient still deciding) but to please pencil in 06/25/19 date in the meantime. Provided patient my direct line [phone] so she has it  Patient had no additional questions at this time.

## 2019-06-15 ENCOUNTER — Ambulatory Visit: Payer: Commercial Managed Care - PPO | Attending: Plastic Surgery | Admitting: Plastic Surgery

## 2019-06-15 ENCOUNTER — Encounter (HOSPITAL_BASED_OUTPATIENT_CLINIC_OR_DEPARTMENT_OTHER): Payer: Self-pay | Admitting: Plastic Surgery

## 2019-06-15 VITALS — BP 117/79 | HR 79 | Temp 97.3°F | Ht 66.0 in | Wt 168.0 lb

## 2019-06-15 DIAGNOSIS — Z170 Estrogen receptor positive status [ER+]: Secondary | ICD-10-CM | POA: Insufficient documentation

## 2019-06-15 DIAGNOSIS — C50912 Malignant neoplasm of unspecified site of left female breast: Secondary | ICD-10-CM | POA: Insufficient documentation

## 2019-06-15 NOTE — Progress Notes (Addendum)
[facility]  PLASTIC AND RECONSTRUCTION  NEW PATIENT EVALUTION    CC  New patient consultation referred by Lia Hopping for Breast reconstruction in the setting of left total mastectomy.    HPI  Yolanda Bowers is a 48 year old female with triple positive left sided breast cancer, who is here for discussion of breast reconstruction following left mastectomy as well as breast reduction on the right for symmetry.     The patient felt a left pea sized axillary lymph node, did not notice any breast changes.  She denies any breast pain, skin changes, nipple pain or discharge.  She underwent bilateral mammography which revealed dense breast tissue bilaterally. The right side was negative. In the left breast, there was a mass in the upper outer quadrant measuring 8 mm with asymmetry spanning at least 24 mm. By ultrasound, this measured at least 22 mm at the 3 o'clock position, and at the 2 o'clock position, there was a mass, axillary tail, 17 mm from the nipple, as well as an abnormal intramammary node. She has been on neochemotherapy since April, which she finished recently. She states she is planned for post op radiation after surgery.     States she has had about size C breasts most of her life, but after her second child she had increase in breast size and is now DD. She states that she has actually been bothered by the overall size of her breast because they limit her ability to do physical exercise.  She is very active and considered breast reduction prior to her diagnosis of breast cancer.    Says she has had a negative BRCA test     PMH  There is no problem list on file for this patient.  History of "brain virus" resolved   No past medical history on file.  PSH  No past surgical history on file.  Tonsillectomy age 41.   ALLERGIES  Review of patient's allergies indicates:  Allergies   Allergen Reactions   . Codeine        MEDS  Outpatient Medications Marked as Taking for the 06/15/19 encounter (Office Visit) with  Carleene Cooper, MD   Medication Sig Dispense Refill   . Famotidine (PEPCID OR) Take 2 tablets by mouth.       [place]  Lives in [place]. 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 No young children to pick up.   Social History     Tobacco Use   . Smoking status: Not on file   Substance Use Topics   . Alcohol use: Not on file   . Drug use: Not on file     No drugs, alcohol or tobacco     FH  No family history of Breast Cancer     ROS  Attached Review of Systems (entered/ scanned by MA) reviewed and confirmed? YES    PE  BP 117/79   Pulse 79   Temp 97.3 F (36.3 C) (Temporal)   Ht '5\' 6"'$  (1.676 m)   Wt 168 lb (76.2 kg)   SpO2 98%   BMI 27.12 kg/m   GENERAL: Well appearing female in no acute distress.   HEENT: normocephalic atraumatic, allopecia is present.   LUNGS: Breathing comfortably on room air.   CV: palpable radial pulse on the right.   NEURO: A&O x 3.    Physical Exam     Breast  Breast Measurements:   Right Left   A: Sternal notch to nipple distance (  cm) 28.5 30   B: Midsternum to nipple distance (cm)     C: Midclavicle to nipple distance (cm)     D: Nipple to inframammary fold (cm) 15 15.5   E: Base width (cm) 13 13   F: Inframammary distance (cm)       Right Left   Areolar diameter (cm)     Nipple diameter (cm)     Superior tissue pinch test (cm)     Breast ptosis (grade 0-3)           Her breasts are asymmetrical.         No palpable axillary lymphadenopathy.     Abd: No skin breakdown or erythema. There is a moderate amount of subcutaneous adipose tissue.     RECORDS REVIEW    Outside records including notes from [facility] and [place].      ASSESSMENT  Yolanda Bowers is a 48 year old female triple positive left sided breast cancer, who is here for discussion of breast reconstruction following this left mastectomy as well as breast reduction on the right for symmetry.     We discussed implant based reconstruction vs tissue reconstruction.     PLAN    At this point the patient would like to spend some time  thinking about whether or not she wants any reconstruction done to her left breast.  She is leaning towards going with a simple mastectomy on the left side and gone without any implants or tissue reconstruction.  She Is also thinking about having reduction of the right side to balance with the left.  At this point the patient will proceed with her surgery with Dr. Felton Clinton.  And she will think about what her reconstructive desires are and follow-up with [place] as she wishes after this.  We discussed that if she does wish to have breast reconstructionIt would be best if she notified us and Dr. Felton Clinton prior to her surgery so that we could place tissue expanders at the time of her mastectomy.    I saw and evaluated the patient and agree with Dr.Mannat Benedetti's note.  I have read, reviewed and edited the above documentation as needed.    I spent a total time of 30 minutes face-to-face with the patient, of which more than 50% was spent counseling and coordinating care as outlined in this note.    Hanley Seamen. Lowella Dell, MD  Attending Physician, Assistant Professor  Division of [facility] [place]. [place]  [place], [place], [place]

## 2019-06-15 NOTE — Progress Notes (Signed)
REVIEW OF SYSTEMS:  Review of Systems includes the following responses to our health assessment questionnaire.  Any complications with prior surgery? No  Have you ever had anesthesia problems? No  Family history of anesthesia problems? No      ANY CURRENT PROBLEMS WITH YOUR HEALTH?    ADDITIONAL INFORMATION   GENERAL Recent Weight Gain/Loss  Fatigue/Trouble Sleeping  Fever/Chills/Night Sweats   yes  yes  yes   Current Ht & Wt:  Ht 5\' 6"  (1.676 m)  Wt 168 lb (76.204 kg)  Body mass index is 27.12 kg/m.   EAR/NOSE/  MOUTH/  THROAT Hearing Loss/Hearing Aid  Ear Problems  Nose Problems  Mouth or Throat Problems  Nose bleeds/Sinus Problems  Dental Problems/Dentures  Loose or Missing Tooth/Teeth No  No  No  No  yes  No  No    EYE Wear glasses/contacts  Eye Problems  Yellowing of white part of eyes yes  No  yes     Right eye during chemo   NEUROLOGY Problems with vision  Headaches/Dizziness  Seizures  Fainting/Unconsciousness  Numbness/Tingling/Weakness yes  yes  No  No  yes Blurry after since chemo  Daily      Feet hands from chemo   HEART Chest Pain  Heart Murmur  High Blood Pressure  Recent Heart Attack/MI  Artificial Heart Valve(s)  Able to walk two flights of stairs No  No  No  No  No  Yes           but its hard   LUNG Shortness of breath (day or night)  Asthma  Sleep Apnea/Snoring  Difficulty sleeping  Lung problems  Recent cold or cough yes  No  No  yes  No  No Since chemo      Some times   SKIN Masses/Bumps/Lumps  Rashes  Lesions/Cuts/Scrapes  Wounds/Blisters No  No  No  No       Hands/fingernails    STOMACH/  GASTROINTESINAL/  COLON/  RECTUM Stomach/Abdominal Pain  Hiatal hernia  Heartburn/Indigestion  Nausea/Vomiting  Diarrhea  Constipation  Blood in Stool  Jaundice/Yellowing of skin  Hepatitis  No  No  yes  yes  yes  yes  No  No  No     After since chemo  ''  Minor but annoying daily   Sometime       Type:    MUSCLES/  BONES       Joint Pain  Back Pain/Disc Disease  Sprain/Strain  Stiffness/Arthritis  Artificial  joint(s)  Other physical disability yes  No  No  yes  No  No Location: right hip          Type:    URINARY TRACT        Female/Female Issues  REPRODUCTION   Urinary Problems  Pain with urination  Kidney Problems/Kidney Stones      Female/Female Specific Problems  Females- Could you be pregnant? No  No  No      No  No      BLOOD/  LYMPH Bleeding Problems  Anemia  Swollen or enlarged glands No  yes  No    IMMUNOLOGICAL Hay Fever  Allergies  HIV/Aids yes  yes  No   codeine   ENDOCRINE Heat/Cold Intolerance  Hyperthyroid/Hypothyroid  Increased thirst/Diabetes No  No  No    MENTAL HEALTH Anxiety/Depression  Psychiatric Care  Other Concerns yes  No  No Becoming cancer

## 2019-06-16 ENCOUNTER — Ambulatory Visit: Payer: Commercial Managed Care - PPO | Attending: Surgery

## 2019-06-16 ENCOUNTER — Other Ambulatory Visit: Payer: Self-pay | Admitting: Surgery

## 2019-06-16 DIAGNOSIS — C50412 Malignant neoplasm of upper-outer quadrant of left female breast: Secondary | ICD-10-CM

## 2019-06-16 NOTE — Progress Notes (Signed)
Briefly, Yolanda Bowers is a 48 year old female triple positive left sided breast cancer.  She was diagnosed with T3N3b.  She underwent neoadjuvant chemotherapy.  She completed her chemotherapy recently and stated it went pretty well.  She is experiencing lower extremity swelling now.  She is tentatively scheduled for surgery Nov 2.  She recently underwent breast ultrasound which showed good response to chemo.   The patient is interested in overall smaller breast and her goal is to have the least amount of surgery.    At this point the patient would like to spend some time thinking about whether or not she wants any reconstruction done to her left breast.  She is leaning towards going with a simple mastectomy on the left side and gone without any implants or tissue reconstruction.  She Is also thinking about having reduction of the right side to balance with the left.      We did discuss no reconstruction, implant based reconstruction and flap reconstruction.    TISSUE EXPANDERS  We discussed the use of tissue expander as a temporary place holder to maintain the breast skin envelope, allow the mastectomy skin flaps to heal, but less pressure of the flap skin and nipple that would be placed at the time of her mastectomy.  The tissue expanders can be exchange to either permanent implants or autologous tissue flaps when ready.  Risks of tissue expanders were reviewed including but not limited to bleeding, infection requiring removal, expander deflation or malposition requiring replacement, inability to have an MRI while expander is in place, possible need for deflation and re-inflation if radiation therapy is needed, and discomfort related to the expanders and the expansion process. We discussed TE placement at the time of surgery would increase the length of surgery by 1-2 hours, her LOS in the hospital would be 23 hours, that she would have 2-4 drains (1-2 in each breast) that would remain 1-3 weeks, and light  activity for 1 month.  We would begin expansion at 3 weeks or whenever she is healed and expand once a week until she reach the desired volume and allow 2-3 months at final expansion volume prior to the next stage.       IMPLANT BASED RECONSTRUCTION   We discussed implant based reconstruction as a means of reconstruction after unilateral or bilateral mastectomy.  We discussed the option for saline versus silicone implants and the associated complications with implanted based reconstruction including but not limited to wound healing problems such as infection or dehiscence, bleeding, seroma, hematoma, implant malposition, capsular contracture, implant rupture (saline rupture leads to deflated implant versus silicone rupture may be silent), need for MRI to screening for silicone implant rupture, need for further procedures to replace implants after rupture, risk of ALCL, implant rippling, animation deformity for subpectoral placement, implant infections leading for implant explantation.      AUTOLOGOUS RECONSTRUCTION  We discussed autologous reconstruction utilizing abdominal skin and fat for free flap breast reconstruction with DIEP vs. msTRAM flap to reconstruct the unilateral breast.  She adequate soft tissue for reconstruction.  This would be an approximately 8-12 hour surgery with 5 days in the hospital and 4-6 weeks total recovery time. There would be a long transverse abdominal scar, and a scar around the umbilicus. We reviewed the risks of this procedure including but no limited to, general anesthesia, hematoma, seroma, need for re-operation, abdominal hernia, abdominal wall bulge or weakness, abdominal contour changes, fat necrosis, wound healing complications, sensation changes,  DVT, PE, stroke, flap thrombosis, flap partial or complete loss, and other anesthesia risks.      BREAST REDUCTION/REDUCTION MAMMOPLASTY:  We had a long discussion regarding reduction mammoplasty in combination with the lumpectomy  and for symmetry on the right breast.  The pedicle is determined by the lumpectomy sight.  I would mirror on the contralateral side.The risks and complications of a reduction mammoplasty have been discussed with this patient. These include, but are not limited to, bleeding, infection, hematoma, seroma, wound healing delays or tissue loss (involving the breast flaps, breast mound, and/or nipple-areolar complex,) fat necrosis, asymmetry, need for revisional surgery, anesthesia, paresthesia, or pain which may be permanent, inability to nurse in the future, loss of projection of the nipple or nipple inversion, and permanent changes in pigmentation of the nipple-areolar complex. More unusual complications of surgery were also discussed, including deep venous thrombosis, pulmonary embolism, pneumothorax, respiratory or cardiovascular complications (myocardial infarction, stroke, or death), and adverse reactions to medications given during or after surgery.  In the event of nipple loss she could undergo nipple reconstruction at a later date.      At this point the patient will proceed with her surgery with Dr. Felton Clinton.  I will discuss with Dr. Felton Clinton if she is amenable to lumpectomy on the left breast.  If so I recommended staged oncoplastic reduction.  If she needs to undergo mastectomy, she will think about what her reconstructive desires are and follow-up with [place] as she wishes after this.  We discussed that if she does wish to have breast reconstruction, it would be best if she notified us and Dr. Felton Clinton prior to her surgery so that we could place tissue expanders at the time of her mastectomy.    I spent a total time of 30 minutes face-to-face with the patient, of which more than 50% was spent counseling and coordinating care as outlined in this note.    Hanley Seamen. Lowella Dell, MD  Attending Physician, Assistant Professor  Division of [place] [facility] [place]. [place]  [place], [place], [place]

## 2019-06-18 ENCOUNTER — Ambulatory Visit: Payer: Commercial Managed Care - PPO

## 2019-06-21 ENCOUNTER — Ambulatory Visit: Payer: Commercial Managed Care - PPO

## 2019-06-22 ENCOUNTER — Encounter (HOSPITAL_BASED_OUTPATIENT_CLINIC_OR_DEPARTMENT_OTHER): Payer: Self-pay | Admitting: Surgery

## 2019-06-22 DIAGNOSIS — C50919 Malignant neoplasm of unspecified site of unspecified female breast: Secondary | ICD-10-CM

## 2019-06-22 DIAGNOSIS — C50412 Malignant neoplasm of upper-outer quadrant of left female breast: Secondary | ICD-10-CM

## 2019-06-22 NOTE — Telephone Encounter (Signed)
Contacted Yolanda Bowers to schedule confirm surgery (mapping, left axilary sentinel luymph node biopsy, left total mastectomy,, right breast reduction) with Dr. Lia Hopping, MD and Dr. Lowella Dell on DOS 06/25/19. Patient had her phone evaluation with [facility] yesterday. Informed her about her covid test tomorrow 06/23/19 at 3:15pm at [facility] and provided her with the address and instructions. Informed her that i'll send her her surgery letter once I obtain I have her post op appointment information. Patient knows to expect a call today from [facility] with her check in time for Monday. Patient had no additional questions at this time.

## 2019-06-22 NOTE — Telephone Encounter (Signed)
Contacted [facility]. Patient will call me back In about 30 minutes she said . t

## 2019-06-23 ENCOUNTER — Ambulatory Visit (HOSPITAL_BASED_OUTPATIENT_CLINIC_OR_DEPARTMENT_OTHER): Payer: Commercial Managed Care - PPO | Attending: Internal Medicine

## 2019-06-23 DIAGNOSIS — Z20828 Contact with and (suspected) exposure to other viral communicable diseases: Secondary | ICD-10-CM | POA: Insufficient documentation

## 2019-06-23 DIAGNOSIS — Z01812 Encounter for preprocedural laboratory examination: Secondary | ICD-10-CM | POA: Insufficient documentation

## 2019-06-23 DIAGNOSIS — Z20822 Contact with and (suspected) exposure to covid-19: Secondary | ICD-10-CM

## 2019-06-23 NOTE — Progress Notes (Signed)
Patient was seen on 06/23/2019 at the [facility] drive up site where a sample of dual nasal pharyngeal collection was taken. The specimen was sent to the [facility] lab for COVID-19 testing.  Patient will be informed of test results within 48 hours.  Patient received informational instructions on self-care.    The specimen was collected by: ER

## 2019-06-23 NOTE — Patient Instructions (Signed)
Evaluation for COVID-19  Testing, Result Information, Symptom Management    Who is being tested for COVID-19?  [facility] is testing patients for COVID-19 for:  1. Patients who have symptoms that may be related to COVID-19  2. Patients who were exposed to COVID-19  3. Patients who require testing for travel or to return to work  4. Patients who do not have symptoms, but have an upcoming surgery or procedure that requires routine COVID-19 testing beforehand    If a COVID-19 test was ordered, what number do I call to set up a swabbing appointment?  You may call the [facility] COVID-19 Line at [phone].    If you are experiencing symptoms, what do we believe you have?  You have a viral syndrome, which may include symptoms like muscle aches, fevers, chills, runny nose, cough, sneezing, sore throat, vomiting or diarrhea.     SARS-CoV-2, the virus that causes COVID-19, is one of the potential viruses you may have. You may be just as likely to have a different viral infection such as the common cold or flu.    Most patients with COVID-19 have mild symptoms and recover on their own. Resting, staying hydrated, and sleeping are typically helpful. As of today's visit, you are well enough to go home and treat your symptoms with oral fluids and medicines for fevers, cough, pain, etc. If your symptoms worsen, you should seek additional medical care.    Why is COVID-19 testing being performed before my surgery/procedure?  The safety of our patients and staff is our top priority. We are performing COVID-19 testing before certain surgeries and procedures to maintain everyone's safety and help prevent others from getting infected or exposed.    When will I receive results for my COVID-19 test?  If COVID-19 testing is performed, the results should be available in 1-2 days. You may find testing follow-up instructions here: [email or web address]    Who can I contact for questions?  Call  [phone] for any COVID-19 questions or if your symptoms are worsening. Please allow 48 hours for results to finalize before contacting us about your result status.    How do I receive results?  Please do not contact the Emergency Department or clinic for results of this test. Please wait to be contacted as outlined below and do not go to your doctor's office for results.    IF THE RESULT IS POSITIVE OR INCONCLUSIVE  A member of the [facility] team will call you for further discussion. You may also view your result in eCare or through a QR code you may receive at your testing site.    IF THE RESULT IS NEGATIVE  You will receive this information by phone, via eCare, or a QR code you may receive at your testing site.    Pre-surgical evaluations  A member of your surgery team will review your results and contact you if needed. Please remain isolated until your surgery date to reduce the risk of COVID-19 exposure.    eCare  If you are a [facility] patient, eCare ([email or web address]) is the fastest way to receive your results. Results will be released to eCare within one hour of being posted in our system, and you may receive your results before we are able to contact you.    QR Code  You may receive a QR code label at the time of your test. If you do not have an eCare account, you may use the QR code label to view   your results at [email or web address]. You will not receive a notification when your result is ready to view on this site, but you can visit the site as many times as you wish to check the result status.    What do I do while I wait for my test results?   Stay home except to get medical care. Do not return to work or your regular activities outside of home. Remain isolated until you receive your results.    After receiving your results, follow the instructions here: [email or web address]    Please follow the precautions below:   Stay home except to  get medical care.     Do not go to work, school, or public areas. Avoid using public transportation, ride-sharing, or taxis.   Separate yourself from other people in your home as much as possible.   Stay in a specific room and away from other people in your home as much as possible. Use a separate bathroom if possible.   Do not share household items with other people in your home.   This includes sharing dishes, drinking glasses, cups, eating utensils, towels or bedding. After using these items, they should be washed thoroughly with soap and water.   Clean all "high-touch" surfaces regularly.   This includes counters, tabletops, doorknobs, bathroom fixtures, toilets, phones, keyboards, tablets and bedside tables. Also, clean any surfaces that may have blood, stool or body fluids on them. Use a household cleaning spray or wipe, according to the label instructions.   Cover your coughs and sneezes with a tissue, mask or the inside of your elbow.   Throw used tissues in a lined trash can; immediately wash your hands with soap and water for at least 20 seconds or clean your hands with an alcohol-based hand sanitizer that contains at least 60% alcohol. Soap and water should be used if hands are visibly dirty.   When seeking care at a healthcare facility:   Seek prompt medical attention if your illness is worsening (e.g., difficulty breathing).   When possible, call the healthcare provider before arriving.   Put on a facemask before you enter the facility.   If possible, put on a facemask before the ambulance or paramedics arrive.   These steps will help the healthcare provider's office prevent other people from getting infected or exposed.      Please see the resources below for more information  Information Lines  [place] [facility] COVID-19 Call Center: 1-800-525-0127   [facility] COVID-19 Line: [phone]    [facility] Websites  COVID-19  Information  [email or web address]    [place] Department of Health Websites  General Facts on COVID-19  doh.[place].gov/Emergencies/NovelCoronavirusOutbreak2020/FactSheet    What to do if you were potentially exposed to someone with confirmed coronavirus disease (COVID-19)  doh.[place].gov/Portals/1/Documents/1600/coronavirus/COVIDexposed.pdf    Centers for Disease Control and Prevention (CDC) Websites  COVID-19 FAQs:  cdc.gov/coronavirus/2019-ncov/faq.html    What to do if you are sick: cdc.gov/coronavirus/2019-ncov/if-you-are-sick/steps-when-sick.html

## 2019-06-24 LAB — COVID-19 CORONAVIRUS QUALITATIVE PCR: COVID-19 Coronavirus Qual PCR Result: NOT DETECTED

## 2019-06-24 NOTE — Progress Notes (Signed)
eCare message sent for negative COVID-19 result

## 2019-06-25 ENCOUNTER — Ambulatory Visit (HOSPITAL_BASED_OUTPATIENT_CLINIC_OR_DEPARTMENT_OTHER): Payer: Commercial Managed Care - PPO | Admitting: Surgery

## 2019-06-25 ENCOUNTER — Ambulatory Visit
Admission: RE | Admit: 2019-06-25 | Discharge: 2019-06-26 | Disposition: A | Discharge status: Home / Self Care | Payer: Commercial Managed Care - PPO | Attending: Surgery | Admitting: Surgery

## 2019-06-25 ENCOUNTER — Other Ambulatory Visit (HOSPITAL_BASED_OUTPATIENT_CLINIC_OR_DEPARTMENT_OTHER): Payer: Self-pay | Admitting: Surgery

## 2019-06-25 ENCOUNTER — Other Ambulatory Visit: Payer: Self-pay | Admitting: Surgery

## 2019-06-25 ENCOUNTER — Ambulatory Visit (HOSPITAL_BASED_OUTPATIENT_CLINIC_OR_DEPARTMENT_OTHER): Payer: Commercial Managed Care - PPO

## 2019-06-25 ENCOUNTER — Other Ambulatory Visit: Payer: Self-pay

## 2019-06-25 DIAGNOSIS — C50012 Malignant neoplasm of nipple and areola, left female breast: Secondary | ICD-10-CM | POA: Insufficient documentation

## 2019-06-25 DIAGNOSIS — C50412 Malignant neoplasm of upper-outer quadrant of left female breast: Secondary | ICD-10-CM

## 2019-06-25 DIAGNOSIS — Z170 Estrogen receptor positive status [ER+]: Secondary | ICD-10-CM | POA: Insufficient documentation

## 2019-06-25 DIAGNOSIS — N651 Disproportion of reconstructed breast: Secondary | ICD-10-CM | POA: Insufficient documentation

## 2019-06-25 DIAGNOSIS — D611 Drug-induced aplastic anemia: Secondary | ICD-10-CM | POA: Insufficient documentation

## 2019-06-25 DIAGNOSIS — R0600 Dyspnea, unspecified: Secondary | ICD-10-CM | POA: Insufficient documentation

## 2019-06-25 DIAGNOSIS — T451X5A Adverse effect of antineoplastic and immunosuppressive drugs, initial encounter: Secondary | ICD-10-CM

## 2019-06-25 DIAGNOSIS — C50919 Malignant neoplasm of unspecified site of unspecified female breast: Secondary | ICD-10-CM

## 2019-06-25 DIAGNOSIS — C773 Secondary and unspecified malignant neoplasm of axilla and upper limb lymph nodes: Secondary | ICD-10-CM

## 2019-06-25 DIAGNOSIS — N6332 Unspecified lump in axillary tail of the left breast: Secondary | ICD-10-CM

## 2019-06-25 DIAGNOSIS — C50912 Malignant neoplasm of unspecified site of left female breast: Secondary | ICD-10-CM

## 2019-06-25 HISTORY — PX: PR MASTECTOMY SIMPLE COMPLETE: 19303

## 2019-06-25 HISTORY — DX: Adverse effect of antineoplastic and immunosuppressive drugs, initial encounter: T45.1X5A

## 2019-06-25 HISTORY — PX: BREAST RECONSTRUCTION: SHX5075

## 2019-06-25 LAB — GLUCOSE POC, ~~LOC~~: Glucose (POC): 93 mg/dL (ref 62–125)

## 2019-06-26 ENCOUNTER — Other Ambulatory Visit (HOSPITAL_COMMUNITY): Payer: Self-pay | Admitting: Unknown Physician Specialty

## 2019-06-26 ENCOUNTER — Ambulatory Visit: Payer: Commercial Managed Care - PPO

## 2019-06-26 MED ORDER — OXYCODONE HCL 5 MG OR TABS
ORAL_TABLET | ORAL | 0 refills | Status: DC
Start: 2019-06-26 — End: 2019-11-22

## 2019-06-26 MED ORDER — POLYETHYLENE GLYCOL 3350 17 GM/SCOOP OR POWD
ORAL | 0 refills | Status: DC
Start: 2019-06-26 — End: 2020-03-05

## 2019-06-26 MED ORDER — ONDANSETRON HCL 4 MG OR TABS
ORAL_TABLET | ORAL | 0 refills | Status: DC
Start: 2019-06-26 — End: 2019-11-22

## 2019-06-26 MED ORDER — IBUPROFEN 600 MG OR TABS
ORAL_TABLET | ORAL | 0 refills | Status: DC
Start: 2019-06-26 — End: 2020-03-04

## 2019-06-27 ENCOUNTER — Other Ambulatory Visit (HOSPITAL_BASED_OUTPATIENT_CLINIC_OR_DEPARTMENT_OTHER): Payer: Commercial Managed Care - PPO

## 2019-06-27 ENCOUNTER — Encounter (HOSPITAL_BASED_OUTPATIENT_CLINIC_OR_DEPARTMENT_OTHER): Payer: Commercial Managed Care - PPO

## 2019-06-27 ENCOUNTER — Encounter (HOSPITAL_BASED_OUTPATIENT_CLINIC_OR_DEPARTMENT_OTHER): Payer: Commercial Managed Care - PPO | Admitting: Physician Assistant

## 2019-07-06 ENCOUNTER — Ambulatory Visit (HOSPITAL_BASED_OUTPATIENT_CLINIC_OR_DEPARTMENT_OTHER): Payer: Commercial Managed Care - PPO

## 2019-07-06 ENCOUNTER — Encounter (HOSPITAL_BASED_OUTPATIENT_CLINIC_OR_DEPARTMENT_OTHER): Payer: Self-pay | Admitting: Physician Assistant

## 2019-07-06 ENCOUNTER — Ambulatory Visit (HOSPITAL_BASED_OUTPATIENT_CLINIC_OR_DEPARTMENT_OTHER): Payer: Commercial Managed Care - PPO | Admitting: Physician Assistant

## 2019-07-06 ENCOUNTER — Other Ambulatory Visit (HOSPITAL_COMMUNITY): Payer: Self-pay | Admitting: Physician Assistant

## 2019-07-06 ENCOUNTER — Ambulatory Visit: Payer: Commercial Managed Care - PPO | Attending: Physician Assistant | Admitting: Physician Assistant

## 2019-07-06 DIAGNOSIS — G5693 Unspecified mononeuropathy of bilateral upper limbs: Secondary | ICD-10-CM | POA: Insufficient documentation

## 2019-07-06 DIAGNOSIS — R197 Diarrhea, unspecified: Secondary | ICD-10-CM | POA: Insufficient documentation

## 2019-07-06 DIAGNOSIS — G5793 Unspecified mononeuropathy of bilateral lower limbs: Secondary | ICD-10-CM | POA: Insufficient documentation

## 2019-07-06 DIAGNOSIS — R110 Nausea: Secondary | ICD-10-CM | POA: Insufficient documentation

## 2019-07-06 DIAGNOSIS — C50919 Malignant neoplasm of unspecified site of unspecified female breast: Secondary | ICD-10-CM

## 2019-07-06 DIAGNOSIS — R079 Chest pain, unspecified: Secondary | ICD-10-CM | POA: Insufficient documentation

## 2019-07-06 DIAGNOSIS — R5383 Other fatigue: Secondary | ICD-10-CM | POA: Insufficient documentation

## 2019-07-06 DIAGNOSIS — C773 Secondary and unspecified malignant neoplasm of axilla and upper limb lymph nodes: Secondary | ICD-10-CM | POA: Insufficient documentation

## 2019-07-06 DIAGNOSIS — Z170 Estrogen receptor positive status [ER+]: Secondary | ICD-10-CM | POA: Insufficient documentation

## 2019-07-06 DIAGNOSIS — Z08 Encounter for follow-up examination after completed treatment for malignant neoplasm: Secondary | ICD-10-CM

## 2019-07-06 DIAGNOSIS — K521 Toxic gastroenteritis and colitis: Secondary | ICD-10-CM

## 2019-07-06 DIAGNOSIS — C50412 Malignant neoplasm of upper-outer quadrant of left female breast: Secondary | ICD-10-CM

## 2019-07-06 DIAGNOSIS — C50812 Malignant neoplasm of overlapping sites of left female breast: Secondary | ICD-10-CM | POA: Insufficient documentation

## 2019-07-06 DIAGNOSIS — Z9012 Acquired absence of left breast and nipple: Secondary | ICD-10-CM | POA: Insufficient documentation

## 2019-07-06 DIAGNOSIS — Z5112 Encounter for antineoplastic immunotherapy: Secondary | ICD-10-CM | POA: Insufficient documentation

## 2019-07-06 DIAGNOSIS — N951 Menopausal and female climacteric states: Secondary | ICD-10-CM | POA: Insufficient documentation

## 2019-07-06 LAB — CBC, ABS NEUTROPHIL
Hematocrit: 32 % — ABNORMAL LOW (ref 36–45)
Hemoglobin: 10.4 g/dL — ABNORMAL LOW (ref 11.5–15.5)
Immature Granulocytes: 0.01 10*3/uL (ref 0.00–0.05)
MCH: 29.7 pg (ref 27.3–33.6)
MCHC: 32.3 g/dL (ref 32.2–36.5)
MCV: 92 fL (ref 81–98)
Neutrophils: 1.64 10*3/uL — ABNORMAL LOW (ref 1.80–7.00)
Platelet Count: 195 10*3/uL (ref 150–400)
RBC: 3.5 10*6/uL — ABNORMAL LOW (ref 3.80–5.00)
RDW-CV: 12.7 % (ref 11.6–14.4)
WBC: 3.21 10*3/uL — ABNORMAL LOW (ref 4.3–10.0)

## 2019-07-06 LAB — HEPATIC FUNCTION PANEL W/ LD
ALT (GPT): 22 U/L (ref 7–33)
AST (GOT): 18 U/L (ref 9–38)
Albumin: 3.9 g/dL (ref 3.5–5.2)
Alkaline Phosphatase (Total): 33 U/L — ABNORMAL LOW (ref 34–121)
Bilirubin (Direct): 0.1 mg/dL (ref 0.0–0.3)
Bilirubin (Total): 0.4 mg/dL (ref 0.2–1.3)
Lactate Dehydrogenase: 142 U/L (ref <210)
Protein (Total): 6.1 g/dL (ref 6.0–8.2)

## 2019-07-06 LAB — BASIC METABOLIC PANEL
Anion Gap: 7 (ref 4–12)
Calcium: 9.2 mg/dL (ref 8.9–10.2)
Carbon Dioxide, Total: 28 meq/L (ref 22–32)
Chloride: 105 meq/L (ref 98–108)
Creatinine: 0.73 mg/dL (ref 0.38–1.02)
Glucose: 104 mg/dL (ref 62–125)
Potassium: 3.8 meq/L (ref 3.6–5.2)
Sodium: 140 meq/L (ref 135–145)
Urea Nitrogen: 18 mg/dL (ref 8–21)
eGFR by CKD-EPI: >60 mL/min/{1.73_m2} (ref >59)

## 2019-07-06 MED ORDER — GABAPENTIN 100 MG OR CAPS
ORAL_CAPSULE | ORAL | 1 refills | Status: DC
Start: 2019-07-06 — End: 2019-12-25

## 2019-07-07 ENCOUNTER — Encounter (HOSPITAL_BASED_OUTPATIENT_CLINIC_OR_DEPARTMENT_OTHER): Payer: Commercial Managed Care - PPO

## 2019-07-10 ENCOUNTER — Encounter (HOSPITAL_BASED_OUTPATIENT_CLINIC_OR_DEPARTMENT_OTHER): Payer: Self-pay

## 2019-07-11 NOTE — Progress Notes (Signed)
CC  2 1/2 weeks post-op from:  Right breast reduction, inferior pedicle, Wise-pattern skin excision    Surgeon:  Dr. Philomena Course  DOS:    06/25/19    HPI  Yolanda Bowers is a 48 year old female with a history of left breast IDC triple positive and macromastia who is now 2 1/2 weeks post-op from left total mastectomy with left axilla SLNB (Dr. Felton Clinton) and right breast reduction (Dr. Marlou Starks). She is doing well postoperatively. She took one dose of oxycodone and developed severe itching. This was added to her allergy list. She report some residual discomfort which is primarily to her mastectomy side. She is taking ibuprofen and tylenol as needed.  JP drain was removed 07/06/2019 at [facility].  She continues with Herceptin and Perjeta infusions every 3 weeks under the care of Dr. Charna Archer.  She does require PM RT and a referral was submitted to Dr. Mina Marble at [facility] with consult appointment pending.  PT will be working with her once a week to help increase range of motion in preparation for radiation therapy.  Her physical therapist gave her a prescription for a compression sleeve and glove for lymphedema to left arm.  She also has a prescription for postmastectomy bras and prosthesis.      ROS  Negative for fever, chills, nausea or vomiting.  Negative for breast redness, increased swelling or pain.    PE  BP 125/68   Pulse 68   Temp 97.3 F (36.3 C) (Temporal)   Ht 5\' 6"  (1.676 m)   Wt 162 lb (73.5 kg)   SpO2 100%   BMI 26.15 kg/m   GENERAL: Pleasant, cooperative female in no apparent distress.  BREAST: Left breast absent s/p total mastectomy; healing well with mild swelling. Right breast with well healed Wise pattern incision; mild swelling. No erythema, bruising or fluctuance.     ASSESSMENT AND PLAN  Yolanda Bowers is a 48 year old female with a history of left breast cancer who is now 2 1/2 weeks post-op from left total mastectomy with left axilla SLNB (Dr. Felton Clinton) and right breast reduction  (Dr. Marlou Starks).  She is healing and recovering well postoperatively.  No clinical signs or symptoms of infection. She has some mild residual postop swelling that will continue to improve over the next several weeks. Reviewed ongoing restrictions; see Patient Instructions for details.  Continue PT to increase range of motion in preparation for PMRT.  She has a referral to Dr. Mina Marble at [facility] with consult appointment pending.  She does not have plans for further reconstruction at this time.  Advised to contact clinic or on-call provider with concerns, questions or signs or symptoms of infection.     FOLLOW-UP  Return as scheduled with Dr. Philomena Course on 08/31/19 for post-op.

## 2019-07-12 ENCOUNTER — Encounter (HOSPITAL_BASED_OUTPATIENT_CLINIC_OR_DEPARTMENT_OTHER): Payer: Self-pay | Admitting: Adult Health

## 2019-07-12 ENCOUNTER — Encounter (HOSPITAL_BASED_OUTPATIENT_CLINIC_OR_DEPARTMENT_OTHER): Payer: Commercial Managed Care - PPO | Admitting: Adult Health

## 2019-07-12 ENCOUNTER — Ambulatory Visit: Payer: Commercial Managed Care - PPO | Attending: Adult Health | Admitting: Adult Health

## 2019-07-12 DIAGNOSIS — Z9889 Other specified postprocedural states: Secondary | ICD-10-CM | POA: Insufficient documentation

## 2019-07-12 DIAGNOSIS — Z9012 Acquired absence of left breast and nipple: Secondary | ICD-10-CM

## 2019-07-12 DIAGNOSIS — Z853 Personal history of malignant neoplasm of breast: Secondary | ICD-10-CM | POA: Insufficient documentation

## 2019-07-12 NOTE — Patient Instructions (Signed)
Thank you for your visit today to [facility].    WHEN TO CONTACT CLINIC  · Contact clinic for any concerns such as new swelling, redness, drainage, skin color changes, pain, nausea, vomiting, increased fatigue, fevers, chills.    ACTIVITY LEVEL  · Avoid any lifting, pushing, pulling greater than 10 pounds until 4 weeks post-operatively.   · Once you are 4 weeks post-op, okay to start returning to normal activities slowly. Monitor for increased swelling or pain and advance activities as tolerated.    NEXT CLINIC VISIT  · Return as scheduled to see your surgeon, usually 6-8 weeks after surgery. Contact clinic sooner for any concerns.    CONTACT NUMBERS  · To contact clinic:   · To speak with the schedulers or clinic staff (MA/ RN), phone [phone] and select option 2 after the initial automated message. If you have a question requiring medical advice, the front desk staff will take your message and a Plastic Surgery RN or MA will return your call.  Phone calls will be returned based on urgency, generally within 24 hours for all calls.  · For urgent concerns after-hours, phone [phone] to speak to the Plastic Surgery Resident on Call.

## 2019-07-13 ENCOUNTER — Other Ambulatory Visit (HOSPITAL_BASED_OUTPATIENT_CLINIC_OR_DEPARTMENT_OTHER): Payer: Commercial Managed Care - PPO

## 2019-07-15 ENCOUNTER — Encounter (HOSPITAL_BASED_OUTPATIENT_CLINIC_OR_DEPARTMENT_OTHER): Payer: Self-pay | Admitting: Adult Health

## 2019-07-18 ENCOUNTER — Other Ambulatory Visit: Payer: Self-pay

## 2019-07-18 DIAGNOSIS — Z20822 Contact with and (suspected) exposure to covid-19: Secondary | ICD-10-CM

## 2019-07-20 ENCOUNTER — Encounter (HOSPITAL_BASED_OUTPATIENT_CLINIC_OR_DEPARTMENT_OTHER): Payer: Commercial Managed Care - PPO

## 2019-07-20 LAB — NOVEL CORONAVIRUS, NAA: SARS-CoV-2, NAA: NOT DETECTED

## 2019-07-27 DIAGNOSIS — C50412 Malignant neoplasm of upper-outer quadrant of left female breast: Secondary | ICD-10-CM

## 2019-07-27 DIAGNOSIS — Z170 Estrogen receptor positive status [ER+]: Secondary | ICD-10-CM

## 2019-07-28 ENCOUNTER — Ambulatory Visit: Payer: Commercial Managed Care - PPO | Attending: Physician Assistant

## 2019-07-28 DIAGNOSIS — Z5112 Encounter for antineoplastic immunotherapy: Secondary | ICD-10-CM | POA: Insufficient documentation

## 2019-07-28 DIAGNOSIS — C773 Secondary and unspecified malignant neoplasm of axilla and upper limb lymph nodes: Secondary | ICD-10-CM | POA: Insufficient documentation

## 2019-07-28 DIAGNOSIS — Z170 Estrogen receptor positive status [ER+]: Secondary | ICD-10-CM | POA: Insufficient documentation

## 2019-07-28 DIAGNOSIS — C50812 Malignant neoplasm of overlapping sites of left female breast: Secondary | ICD-10-CM | POA: Insufficient documentation

## 2019-07-30 ENCOUNTER — Encounter (HOSPITAL_BASED_OUTPATIENT_CLINIC_OR_DEPARTMENT_OTHER): Payer: Commercial Managed Care - PPO | Admitting: Adult Health

## 2019-07-31 ENCOUNTER — Ambulatory Visit: Payer: Commercial Managed Care - PPO | Attending: Radiation Oncology | Admitting: Radiation Oncology

## 2019-07-31 ENCOUNTER — Other Ambulatory Visit (HOSPITAL_COMMUNITY): Payer: Self-pay | Admitting: Oral and Maxillofacial Surgery

## 2019-07-31 ENCOUNTER — Other Ambulatory Visit: Payer: Self-pay | Admitting: Radiation Oncology

## 2019-07-31 ENCOUNTER — Ambulatory Visit (HOSPITAL_BASED_OUTPATIENT_CLINIC_OR_DEPARTMENT_OTHER): Payer: Commercial Managed Care - PPO | Admitting: Radiation Oncology

## 2019-07-31 DIAGNOSIS — Z170 Estrogen receptor positive status [ER+]: Secondary | ICD-10-CM

## 2019-07-31 DIAGNOSIS — Z510 Encounter for antineoplastic radiation therapy: Secondary | ICD-10-CM | POA: Insufficient documentation

## 2019-07-31 DIAGNOSIS — C50412 Malignant neoplasm of upper-outer quadrant of left female breast: Secondary | ICD-10-CM | POA: Insufficient documentation

## 2019-07-31 MED ORDER — AMOXICILLIN 500 MG OR CAPS
ORAL_CAPSULE | ORAL | 0 refills | Status: DC
Start: 2019-07-31 — End: 2019-11-22

## 2019-08-02 ENCOUNTER — Other Ambulatory Visit (HOSPITAL_BASED_OUTPATIENT_CLINIC_OR_DEPARTMENT_OTHER): Payer: Commercial Managed Care - PPO

## 2019-08-08 ENCOUNTER — Ambulatory Visit: Payer: Commercial Managed Care - PPO | Attending: Radiation Oncology

## 2019-08-08 ENCOUNTER — Encounter (HOSPITAL_BASED_OUTPATIENT_CLINIC_OR_DEPARTMENT_OTHER): Payer: Commercial Managed Care - PPO | Admitting: Medical Oncology

## 2019-08-08 ENCOUNTER — Other Ambulatory Visit (HOSPITAL_BASED_OUTPATIENT_CLINIC_OR_DEPARTMENT_OTHER): Payer: Commercial Managed Care - PPO

## 2019-08-08 ENCOUNTER — Encounter (HOSPITAL_BASED_OUTPATIENT_CLINIC_OR_DEPARTMENT_OTHER): Payer: Commercial Managed Care - PPO

## 2019-08-08 DIAGNOSIS — C50412 Malignant neoplasm of upper-outer quadrant of left female breast: Secondary | ICD-10-CM

## 2019-08-08 DIAGNOSIS — Z170 Estrogen receptor positive status [ER+]: Secondary | ICD-10-CM

## 2019-08-08 DIAGNOSIS — Z510 Encounter for antineoplastic radiation therapy: Secondary | ICD-10-CM | POA: Insufficient documentation

## 2019-08-09 ENCOUNTER — Encounter (HOSPITAL_BASED_OUTPATIENT_CLINIC_OR_DEPARTMENT_OTHER): Payer: Self-pay | Admitting: Radiation Oncology

## 2019-08-09 ENCOUNTER — Ambulatory Visit: Payer: Commercial Managed Care - PPO | Attending: Radiation Oncology

## 2019-08-09 DIAGNOSIS — Z170 Estrogen receptor positive status [ER+]: Secondary | ICD-10-CM | POA: Insufficient documentation

## 2019-08-09 DIAGNOSIS — Z510 Encounter for antineoplastic radiation therapy: Secondary | ICD-10-CM | POA: Insufficient documentation

## 2019-08-09 DIAGNOSIS — C50412 Malignant neoplasm of upper-outer quadrant of left female breast: Secondary | ICD-10-CM

## 2019-08-09 NOTE — IMRT Medical Necessity (Signed)
Yolanda Bowers, Yolanda L.  1/1              Radiation Oncology  [place], [address]  [place]  [address]     Patient:   Yolanda Bowers, Yolanda Bowers  MRN:   [id number]        Attending:    Sammie Bench        Date of Service:   DEC [phone]      MEDICAL NECESSITY:  SPECIAL DOSIMETRY     Clinical Indication/Area of Interest:     __x__    Left CW Ax IMC SCV     This patient has L breast GIII idc triple + IIC [id number] s/p neoadj.dd AC, THP s/p L mastectomy,   snb, with single focus of angiolymphatic involvement les than 22mm, no residual CA, 5 neg. sentinel  lymph nodes. The extent of her disease demanded the need to use forward planned step-and-shoot   (SNS) fields to reduce potential long-term normal tissue complications while delivering effective  therapeutic dose to the cancer.      Due to the complexity of this patient's treatment fields, it was necessary to verify the radiation   doses delivered by the linear accelerator using a diode detector array.  The objectives of these  measurements were to:  (1) verify the fluence and delivered doses to the patient, (2) ensure the   prescribed treatment is deliverable as planned, and (3) ensure patient safety by mitigating any  potential problems prior to patient treatment.  Physics quality assurance (QA) measurements are   required to test all 5 treatment fields independently because it is impossible to predict which  fields should be tested while the remaining field(s) is/are omitted.  Although low in risk, each   field has the same probability for treatment error and all fields must go through the same level of  scrutiny and rigorous quality assurance.              This document is electronically Approved by Sammie Bench, M.D. on 08/09/19 at 11:40 AM.

## 2019-08-12 ENCOUNTER — Ambulatory Visit: Payer: Commercial Managed Care - PPO | Attending: Physician Assistant

## 2019-08-12 DIAGNOSIS — Z01812 Encounter for preprocedural laboratory examination: Secondary | ICD-10-CM | POA: Insufficient documentation

## 2019-08-12 DIAGNOSIS — C50412 Malignant neoplasm of upper-outer quadrant of left female breast: Secondary | ICD-10-CM | POA: Insufficient documentation

## 2019-08-13 ENCOUNTER — Other Ambulatory Visit: Payer: Self-pay

## 2019-08-13 LAB — COVID-19 CORONAVIRUS QUALITATIVE PCR: COVID-19 Coronavirus Qual PCR Result: NOT DETECTED

## 2019-08-14 ENCOUNTER — Ambulatory Visit: Payer: Commercial Managed Care - PPO | Attending: Radiation Oncology

## 2019-08-14 ENCOUNTER — Other Ambulatory Visit (HOSPITAL_COMMUNITY): Payer: Self-pay

## 2019-08-14 ENCOUNTER — Ambulatory Visit: Payer: Commercial Managed Care - PPO | Attending: Medical Oncology

## 2019-08-14 ENCOUNTER — Other Ambulatory Visit (HOSPITAL_BASED_OUTPATIENT_CLINIC_OR_DEPARTMENT_OTHER): Payer: Commercial Managed Care - PPO

## 2019-08-14 ENCOUNTER — Ambulatory Visit (HOSPITAL_BASED_OUTPATIENT_CLINIC_OR_DEPARTMENT_OTHER): Payer: Commercial Managed Care - PPO

## 2019-08-14 DIAGNOSIS — C799 Secondary malignant neoplasm of unspecified site: Secondary | ICD-10-CM | POA: Insufficient documentation

## 2019-08-14 DIAGNOSIS — Z510 Encounter for antineoplastic radiation therapy: Secondary | ICD-10-CM | POA: Insufficient documentation

## 2019-08-14 DIAGNOSIS — Z170 Estrogen receptor positive status [ER+]: Secondary | ICD-10-CM | POA: Insufficient documentation

## 2019-08-14 DIAGNOSIS — C50412 Malignant neoplasm of upper-outer quadrant of left female breast: Secondary | ICD-10-CM

## 2019-08-14 NOTE — Progress Notes (Signed)
08/14/2019          The patient was referred by Dr. Charna Archer for a TTE. Procedures performed and sent to Dr. Ellen Henri for interpretation of results.        Nigel Mormon, RDCS

## 2019-08-15 ENCOUNTER — Ambulatory Visit: Payer: Commercial Managed Care - PPO | Attending: Physician Assistant

## 2019-08-15 ENCOUNTER — Ambulatory Visit (HOSPITAL_BASED_OUTPATIENT_CLINIC_OR_DEPARTMENT_OTHER): Payer: Commercial Managed Care - PPO

## 2019-08-15 ENCOUNTER — Ambulatory Visit (HOSPITAL_BASED_OUTPATIENT_CLINIC_OR_DEPARTMENT_OTHER): Payer: Commercial Managed Care - PPO | Admitting: Medical Oncology

## 2019-08-15 DIAGNOSIS — R197 Diarrhea, unspecified: Secondary | ICD-10-CM

## 2019-08-15 DIAGNOSIS — Z5111 Encounter for antineoplastic chemotherapy: Secondary | ICD-10-CM | POA: Insufficient documentation

## 2019-08-15 DIAGNOSIS — C50412 Malignant neoplasm of upper-outer quadrant of left female breast: Secondary | ICD-10-CM

## 2019-08-15 DIAGNOSIS — M25561 Pain in right knee: Secondary | ICD-10-CM | POA: Insufficient documentation

## 2019-08-15 DIAGNOSIS — Z510 Encounter for antineoplastic radiation therapy: Secondary | ICD-10-CM | POA: Insufficient documentation

## 2019-08-15 DIAGNOSIS — C773 Secondary and unspecified malignant neoplasm of axilla and upper limb lymph nodes: Secondary | ICD-10-CM | POA: Insufficient documentation

## 2019-08-15 DIAGNOSIS — Z08 Encounter for follow-up examination after completed treatment for malignant neoplasm: Secondary | ICD-10-CM

## 2019-08-15 DIAGNOSIS — C50812 Malignant neoplasm of overlapping sites of left female breast: Secondary | ICD-10-CM | POA: Insufficient documentation

## 2019-08-15 DIAGNOSIS — Z170 Estrogen receptor positive status [ER+]: Secondary | ICD-10-CM | POA: Insufficient documentation

## 2019-08-15 DIAGNOSIS — M25562 Pain in left knee: Secondary | ICD-10-CM | POA: Insufficient documentation

## 2019-08-15 LAB — CBC, ABS NEUTROPHIL
Hematocrit: 36 % (ref 36–45)
Hemoglobin: 11.7 g/dL (ref 11.5–15.5)
Immature Granulocytes: 0 10*3/uL (ref 0.00–0.05)
MCH: 28.7 pg (ref 27.3–33.6)
MCHC: 32.9 g/dL (ref 32.2–36.5)
MCV: 88 fL (ref 81–98)
Neutrophils: 1.58 10*3/uL — ABNORMAL LOW (ref 1.80–7.00)
Platelet Count: 192 10*3/uL (ref 150–400)
RBC: 4.07 10*6/uL (ref 3.80–5.00)
RDW-CV: 11.9 % (ref 11.6–14.4)
WBC: 3.27 10*3/uL — ABNORMAL LOW (ref 4.3–10.0)

## 2019-08-15 LAB — HEPATIC FUNCTION PANEL W/ LD
ALT (GPT): 18 U/L (ref 7–33)
AST (GOT): 17 U/L (ref 9–38)
Albumin: 4.1 g/dL (ref 3.5–5.2)
Alkaline Phosphatase (Total): 35 U/L (ref 34–121)
Bilirubin (Direct): 0.1 mg/dL (ref 0.0–0.3)
Bilirubin (Total): 0.5 mg/dL (ref 0.2–1.3)
Lactate Dehydrogenase: 137 U/L (ref <210)
Protein (Total): 6.9 g/dL (ref 6.0–8.2)

## 2019-08-15 LAB — BASIC METABOLIC PANEL
Anion Gap: 6 (ref 4–12)
Calcium: 9.6 mg/dL (ref 8.9–10.2)
Carbon Dioxide, Total: 30 meq/L (ref 22–32)
Chloride: 104 meq/L (ref 98–108)
Creatinine: 0.77 mg/dL (ref 0.38–1.02)
Glucose: 87 mg/dL (ref 62–125)
Potassium: 4 meq/L (ref 3.6–5.2)
Sodium: 140 meq/L (ref 135–145)
Urea Nitrogen: 15 mg/dL (ref 8–21)
eGFR by CKD-EPI: >60 mL/min/{1.73_m2} (ref >59)

## 2019-08-16 ENCOUNTER — Encounter (HOSPITAL_BASED_OUTPATIENT_CLINIC_OR_DEPARTMENT_OTHER): Payer: Self-pay | Admitting: Medical Oncology

## 2019-08-16 ENCOUNTER — Ambulatory Visit: Payer: Commercial Managed Care - PPO | Attending: Radiation Oncology

## 2019-08-16 DIAGNOSIS — C50412 Malignant neoplasm of upper-outer quadrant of left female breast: Secondary | ICD-10-CM | POA: Insufficient documentation

## 2019-08-16 DIAGNOSIS — Z170 Estrogen receptor positive status [ER+]: Secondary | ICD-10-CM | POA: Insufficient documentation

## 2019-08-16 DIAGNOSIS — Z510 Encounter for antineoplastic radiation therapy: Secondary | ICD-10-CM | POA: Insufficient documentation

## 2019-08-20 ENCOUNTER — Ambulatory Visit: Payer: Commercial Managed Care - PPO | Attending: Radiation Oncology

## 2019-08-20 DIAGNOSIS — Z170 Estrogen receptor positive status [ER+]: Secondary | ICD-10-CM | POA: Insufficient documentation

## 2019-08-20 DIAGNOSIS — C50412 Malignant neoplasm of upper-outer quadrant of left female breast: Secondary | ICD-10-CM

## 2019-08-20 DIAGNOSIS — Z510 Encounter for antineoplastic radiation therapy: Secondary | ICD-10-CM | POA: Insufficient documentation

## 2019-08-21 ENCOUNTER — Ambulatory Visit: Payer: Commercial Managed Care - PPO | Attending: Radiation Oncology

## 2019-08-21 DIAGNOSIS — Z170 Estrogen receptor positive status [ER+]: Secondary | ICD-10-CM

## 2019-08-21 DIAGNOSIS — Z510 Encounter for antineoplastic radiation therapy: Secondary | ICD-10-CM | POA: Insufficient documentation

## 2019-08-21 DIAGNOSIS — C50412 Malignant neoplasm of upper-outer quadrant of left female breast: Secondary | ICD-10-CM

## 2019-08-22 ENCOUNTER — Ambulatory Visit: Payer: Commercial Managed Care - PPO | Attending: Radiation Oncology

## 2019-08-22 DIAGNOSIS — C50412 Malignant neoplasm of upper-outer quadrant of left female breast: Secondary | ICD-10-CM | POA: Insufficient documentation

## 2019-08-22 DIAGNOSIS — Z510 Encounter for antineoplastic radiation therapy: Secondary | ICD-10-CM | POA: Insufficient documentation

## 2019-08-22 DIAGNOSIS — Z170 Estrogen receptor positive status [ER+]: Secondary | ICD-10-CM

## 2019-08-23 ENCOUNTER — Ambulatory Visit (HOSPITAL_BASED_OUTPATIENT_CLINIC_OR_DEPARTMENT_OTHER): Payer: Commercial Managed Care - PPO | Admitting: Radiation Oncology

## 2019-08-23 ENCOUNTER — Ambulatory Visit: Payer: Commercial Managed Care - PPO | Attending: Radiation Oncology

## 2019-08-23 ENCOUNTER — Encounter (HOSPITAL_BASED_OUTPATIENT_CLINIC_OR_DEPARTMENT_OTHER): Payer: Self-pay | Admitting: Radiation Oncology

## 2019-08-23 DIAGNOSIS — C50412 Malignant neoplasm of upper-outer quadrant of left female breast: Secondary | ICD-10-CM

## 2019-08-23 DIAGNOSIS — Z510 Encounter for antineoplastic radiation therapy: Secondary | ICD-10-CM | POA: Insufficient documentation

## 2019-08-23 DIAGNOSIS — Z170 Estrogen receptor positive status [ER+]: Secondary | ICD-10-CM

## 2019-08-23 NOTE — Weekly On Treatment Visit (Signed)
[facility]  1/2              Radiation Oncology  [address], [address]  [place]  [address]     Patient:   Yolanda Bowers, Yolanda Bowers  MRN:   [id number]        Attending:    Sammie Bench        Referring:    Marella Chimes  Date of Service:   DEC [phone]      Radiation Oncology On Treatment Weekly Visit     Radiation Dose:  Treatment Summary:  Radiation Oncology - Course: 1     Protocol:   Treatment Site  Current Dose  Modality  From  To  Elapsed Days  Fx.  L SCF  1,200 cGy  x10  08/15/2019  08/23/2019  8  6  L cw, ax, imc, bol  1,200 cGy  x6+x10  08/15/2019  08/23/2019  8  6  L cw/ax/imc/no bol     x6+x10                                   Subjective:     General: doing well     Skin: no complaints     Lymph Nodes: no     Pain: no     RTOG Acute Skin Toxicity Grade:  0 -  No change over baseline  x  1 -  Follicular, faint or dull erythema/epilation/dry desquamation/decreased sweating  2 -  Tender or bright erythema, patchy moist desquamation/moderate edema  3 -  Confluent, moist desquamation other than skin folds, pitting edema  4 -  Ulceration, hemorrhage, necrosis     Objective:   KPS 100     Vitals:  Record date - 08/23/2019 11:53 AM  Vital Signs, Weight and PS  Temp (F) - 36.5  Pulse - 68  Resp - 16  B/P (mmHg) - 133/78  O2 Sat - 99  Room Air - Yes  Allergies  Allergies Reviewed - Yes  Fall Screen  Fallen in the last month - N  Pain Assessment  Having Pain - Y  Location of pain - knees  Pain Intensity-Current - 3  Analgesics: Type/ Schedule - Tylenol, Ibuprofen  Skin  Skin Assessment - 1      Exam: faint erythema     Laboratory/Imaging:     Assessment:     Tolerates well     Plan:       Cont XRT as planned  Skin care with [place] cream     This document is electronically Approved by Tana Felts, M.D. on 08/23/2019 at 12:49 PM        cc:  Noreene Filbert

## 2019-08-27 ENCOUNTER — Ambulatory Visit: Payer: Commercial Managed Care - PPO | Attending: Radiation Oncology

## 2019-08-27 DIAGNOSIS — Z170 Estrogen receptor positive status [ER+]: Secondary | ICD-10-CM | POA: Insufficient documentation

## 2019-08-27 DIAGNOSIS — Z510 Encounter for antineoplastic radiation therapy: Secondary | ICD-10-CM | POA: Insufficient documentation

## 2019-08-27 DIAGNOSIS — C50412 Malignant neoplasm of upper-outer quadrant of left female breast: Secondary | ICD-10-CM | POA: Insufficient documentation

## 2019-08-28 ENCOUNTER — Ambulatory Visit: Payer: Commercial Managed Care - PPO | Attending: Radiation Oncology

## 2019-08-28 DIAGNOSIS — C50412 Malignant neoplasm of upper-outer quadrant of left female breast: Secondary | ICD-10-CM

## 2019-08-28 DIAGNOSIS — Z170 Estrogen receptor positive status [ER+]: Secondary | ICD-10-CM | POA: Insufficient documentation

## 2019-08-28 DIAGNOSIS — Z510 Encounter for antineoplastic radiation therapy: Secondary | ICD-10-CM | POA: Insufficient documentation

## 2019-08-29 ENCOUNTER — Ambulatory Visit: Payer: Commercial Managed Care - PPO | Attending: Radiation Oncology

## 2019-08-29 DIAGNOSIS — C50412 Malignant neoplasm of upper-outer quadrant of left female breast: Secondary | ICD-10-CM | POA: Insufficient documentation

## 2019-08-29 DIAGNOSIS — Z510 Encounter for antineoplastic radiation therapy: Secondary | ICD-10-CM | POA: Insufficient documentation

## 2019-08-29 DIAGNOSIS — Z170 Estrogen receptor positive status [ER+]: Secondary | ICD-10-CM | POA: Insufficient documentation

## 2019-08-30 ENCOUNTER — Ambulatory Visit: Payer: Commercial Managed Care - PPO | Attending: Radiation Oncology

## 2019-08-30 DIAGNOSIS — Z510 Encounter for antineoplastic radiation therapy: Secondary | ICD-10-CM | POA: Insufficient documentation

## 2019-08-30 DIAGNOSIS — Z170 Estrogen receptor positive status [ER+]: Secondary | ICD-10-CM

## 2019-08-30 DIAGNOSIS — C50412 Malignant neoplasm of upper-outer quadrant of left female breast: Secondary | ICD-10-CM | POA: Insufficient documentation

## 2019-08-31 ENCOUNTER — Ambulatory Visit: Payer: Commercial Managed Care - PPO | Attending: Radiation Oncology

## 2019-08-31 ENCOUNTER — Ambulatory Visit (HOSPITAL_BASED_OUTPATIENT_CLINIC_OR_DEPARTMENT_OTHER): Payer: Commercial Managed Care - PPO | Admitting: Radiation Oncology

## 2019-08-31 ENCOUNTER — Ambulatory Visit: Payer: Commercial Managed Care - PPO | Attending: Plastic Surgery | Admitting: Plastic Surgery

## 2019-08-31 ENCOUNTER — Encounter (HOSPITAL_BASED_OUTPATIENT_CLINIC_OR_DEPARTMENT_OTHER): Payer: Commercial Managed Care - PPO | Admitting: Plastic Surgery

## 2019-08-31 VITALS — BP 121/69 | HR 71 | Temp 97.2°F | Ht 66.0 in | Wt 160.0 lb

## 2019-08-31 DIAGNOSIS — Z9012 Acquired absence of left breast and nipple: Secondary | ICD-10-CM | POA: Insufficient documentation

## 2019-08-31 DIAGNOSIS — N62 Hypertrophy of breast: Secondary | ICD-10-CM | POA: Insufficient documentation

## 2019-08-31 DIAGNOSIS — Z170 Estrogen receptor positive status [ER+]: Secondary | ICD-10-CM

## 2019-08-31 DIAGNOSIS — C50412 Malignant neoplasm of upper-outer quadrant of left female breast: Secondary | ICD-10-CM

## 2019-08-31 DIAGNOSIS — Z510 Encounter for antineoplastic radiation therapy: Secondary | ICD-10-CM | POA: Insufficient documentation

## 2019-08-31 NOTE — Progress Notes (Addendum)
Postoperative Note    Identification: Yolanda Bowers is a 49 year old female with hx of triple negative left invasive ductal carcinoma and macromastia who underwent left mastectomy  And L SLNB with R breast reduction with inferior pedicle and wise pattern skin excision on 06/25/19.     Interval History: She reports that she has been doing well since her last visit on 11/22. She has continued her PT exercises to improve her range of motion and is noticing significant improvement. She has started to do silicone scar strips on her incisions, primarily on the right vertical and periareolar components and the left breast incision. She feels like they are working well and is happy with the appearance of her scars. She started radiation on 08/13/19 and has tolerated the first 2 weeks well. She is scheduled for 5 total weeks. She has noticed that she becomes fatigued by the end of the week and is starting to develop mild redness and warmth of her left chest wall. She otherwise is feeling very well.    She has not been wearing a bra and is very happy with the size of her right breast.     Physical Exam:   Blood pressure 121/69, pulse 71, temperature 97.2 F (36.2 C), temperature source Temporal, height 5\' 6"  (1.676 m), weight 160 lb (72.6 kg), SpO2 100 %.  Gen: NAD, well appearing  Breast:   Right: Well healed incision, nipple is well perfused and healthy. . No evidence of erythema or infection. The T point is well healed and all scars are soft and pink. The periareolar incisions are pale and flat, very well healed.  Left: Incisions are healing well, both are pink and flat.   Skin:No evidence of erythema or infection  Extremities: able to abduct her arms overhead bilaterally,left is slightly more limited than the right.     Assessment and Plan:    Yolanda Bowers is a 49 year old female with triple negative left breast cancer, s/p L simple mastectomy, L SLNB and Right breast reduction who is continuing to heal well. She is doing a great job with her PT exercises and her ROM is improving. She also has well healed scars that are soft and pale. She also fine to wear no bra at this time if that is most comfortable for her.     We briefly discussed that she could contact us anytime if she would like more information on breast reconstruction in the future. We also reviewed several pictures of autologous breast reconstruction following simple mastectomy. At this point, she could undergo autologous reconstruction with DIEP flap if she would like reconstruction. She plans to consider her options until the issues with COVID improve and she is done with her oncologic treatment.    She was invited to ask questions which were answered satisfactorily. Patient voiced agreement and understanding with the treatment plan.     Follow up as needed.     I saw and evaluated the patient and agree with Dr.Leonhard's note.    Yolanda Bowers. Yolanda Dell, MD  Attending Physician, Assistant Professor  Division of [facility]. [place]  [place], [place], [place]

## 2019-09-03 ENCOUNTER — Ambulatory Visit: Payer: Commercial Managed Care - PPO | Attending: Radiation Oncology

## 2019-09-03 DIAGNOSIS — Z510 Encounter for antineoplastic radiation therapy: Secondary | ICD-10-CM | POA: Insufficient documentation

## 2019-09-03 DIAGNOSIS — Z170 Estrogen receptor positive status [ER+]: Secondary | ICD-10-CM | POA: Insufficient documentation

## 2019-09-03 DIAGNOSIS — C50412 Malignant neoplasm of upper-outer quadrant of left female breast: Secondary | ICD-10-CM | POA: Insufficient documentation

## 2019-09-04 ENCOUNTER — Ambulatory Visit (HOSPITAL_BASED_OUTPATIENT_CLINIC_OR_DEPARTMENT_OTHER): Payer: Commercial Managed Care - PPO | Admitting: Acupuncturist

## 2019-09-04 ENCOUNTER — Ambulatory Visit: Payer: Commercial Managed Care - PPO | Attending: Radiation Oncology

## 2019-09-04 DIAGNOSIS — G5690 Unspecified mononeuropathy of unspecified upper limb: Secondary | ICD-10-CM | POA: Insufficient documentation

## 2019-09-04 DIAGNOSIS — C50812 Malignant neoplasm of overlapping sites of left female breast: Secondary | ICD-10-CM | POA: Insufficient documentation

## 2019-09-04 DIAGNOSIS — M25562 Pain in left knee: Secondary | ICD-10-CM | POA: Insufficient documentation

## 2019-09-04 DIAGNOSIS — C773 Secondary and unspecified malignant neoplasm of axilla and upper limb lymph nodes: Secondary | ICD-10-CM | POA: Insufficient documentation

## 2019-09-04 DIAGNOSIS — N951 Menopausal and female climacteric states: Secondary | ICD-10-CM | POA: Insufficient documentation

## 2019-09-04 DIAGNOSIS — R110 Nausea: Secondary | ICD-10-CM | POA: Insufficient documentation

## 2019-09-04 DIAGNOSIS — M25551 Pain in right hip: Secondary | ICD-10-CM | POA: Insufficient documentation

## 2019-09-04 DIAGNOSIS — R61 Generalized hyperhidrosis: Secondary | ICD-10-CM | POA: Insufficient documentation

## 2019-09-04 DIAGNOSIS — Z170 Estrogen receptor positive status [ER+]: Secondary | ICD-10-CM | POA: Insufficient documentation

## 2019-09-04 DIAGNOSIS — Z510 Encounter for antineoplastic radiation therapy: Secondary | ICD-10-CM | POA: Insufficient documentation

## 2019-09-04 DIAGNOSIS — M25561 Pain in right knee: Secondary | ICD-10-CM | POA: Insufficient documentation

## 2019-09-04 DIAGNOSIS — R5383 Other fatigue: Secondary | ICD-10-CM | POA: Insufficient documentation

## 2019-09-04 DIAGNOSIS — G5793 Unspecified mononeuropathy of bilateral lower limbs: Secondary | ICD-10-CM | POA: Insufficient documentation

## 2019-09-04 DIAGNOSIS — R635 Abnormal weight gain: Secondary | ICD-10-CM | POA: Insufficient documentation

## 2019-09-04 DIAGNOSIS — C50412 Malignant neoplasm of upper-outer quadrant of left female breast: Secondary | ICD-10-CM

## 2019-09-05 ENCOUNTER — Ambulatory Visit: Payer: Commercial Managed Care - PPO | Attending: Radiation Oncology

## 2019-09-05 DIAGNOSIS — Z510 Encounter for antineoplastic radiation therapy: Secondary | ICD-10-CM | POA: Insufficient documentation

## 2019-09-05 DIAGNOSIS — Z170 Estrogen receptor positive status [ER+]: Secondary | ICD-10-CM | POA: Insufficient documentation

## 2019-09-05 DIAGNOSIS — C50412 Malignant neoplasm of upper-outer quadrant of left female breast: Secondary | ICD-10-CM | POA: Insufficient documentation

## 2019-09-06 ENCOUNTER — Ambulatory Visit: Payer: Commercial Managed Care - PPO | Attending: Radiation Oncology

## 2019-09-06 DIAGNOSIS — C50412 Malignant neoplasm of upper-outer quadrant of left female breast: Secondary | ICD-10-CM | POA: Insufficient documentation

## 2019-09-06 DIAGNOSIS — Z170 Estrogen receptor positive status [ER+]: Secondary | ICD-10-CM | POA: Insufficient documentation

## 2019-09-06 DIAGNOSIS — Z510 Encounter for antineoplastic radiation therapy: Secondary | ICD-10-CM | POA: Insufficient documentation

## 2019-09-07 ENCOUNTER — Encounter (HOSPITAL_BASED_OUTPATIENT_CLINIC_OR_DEPARTMENT_OTHER): Payer: Self-pay | Admitting: Radiation Oncology

## 2019-09-07 ENCOUNTER — Ambulatory Visit: Payer: Commercial Managed Care - PPO | Attending: Physician Assistant

## 2019-09-07 ENCOUNTER — Encounter (HOSPITAL_BASED_OUTPATIENT_CLINIC_OR_DEPARTMENT_OTHER): Payer: Commercial Managed Care - PPO

## 2019-09-07 ENCOUNTER — Ambulatory Visit (HOSPITAL_BASED_OUTPATIENT_CLINIC_OR_DEPARTMENT_OTHER): Payer: Commercial Managed Care - PPO | Admitting: Radiation Oncology

## 2019-09-07 DIAGNOSIS — C50412 Malignant neoplasm of upper-outer quadrant of left female breast: Secondary | ICD-10-CM | POA: Insufficient documentation

## 2019-09-07 DIAGNOSIS — Z170 Estrogen receptor positive status [ER+]: Secondary | ICD-10-CM | POA: Insufficient documentation

## 2019-09-07 DIAGNOSIS — Z510 Encounter for antineoplastic radiation therapy: Secondary | ICD-10-CM | POA: Insufficient documentation

## 2019-09-07 NOTE — Weekly On Treatment Visit (Signed)
Patient Name: Yolanda Bowers page 2/2              [facility] Radiation Oncology  at [facility]  [address] [place]  [address]        Patient:   Yolanda Bowers, Yolanda Bowers  MRN:   [id number]        Attending:    Sammie Bench        Referring:    Marella Chimes  Date of Service:   [id number]            Radiation Oncology On Treatment Weekly Visit  Treatment Site                                                Current Dose  L SCF                                                              3,200 cGy  L cw, ax, imc, bol                                                 3,200 cGy  Subjective:    49 y.o L breast GIII idc triple + IIC [id number] s/p neoadj.dd AC, THP s/p L mastectomy, snb, with   single focus of angiolymphatic involvement les than 51mm, no residual CA, 5 neg. sentinel lymph   nodes  plan:  L chest wall and peripheral lymphatic xrt 50Gy in 25 fractions, no boost  Notes fatigue, some itching relieved with lotions and hydrocortisone, mild dysphagia     Physical Exam:     Vitals:   Record date - 09/07/2019 3:33 PM  Vital Signs, Weight and PS  Temp (F) - 36  Pulse - 67  Resp - 16  B/P (mmHg) - 119/81  O2 Sat - 100  Room Air - Yes  Allergies  Allergies Reviewed - Yes  Fall Screen  Fallen in the last month - No  Pain Assessment  Having Pain - Yes  Location of pain - knees mostly  Pain Descriptor Scale - Mild  Analgesics: Type/ Schedule - Ibuprofen  Meeting goal for symptm relief - Yes  Skin  Skin Assessment - Bright erythema      RTOG Acute Skin Toxicity Grade:  r  0 -  No change over baseline  xr  1 -  Follicular, faint or dull erythema/epilation/dry desquamation/decreased sweating  r  2 -  Tender or bright erythema, patchy moist desquamation/moderate edema  r  3 -  Confluent, moist desquamation other than skin folds, pitting edema  r  4 -  Ulceration, hemorrhage, necrosis     Laboratory Data:           Assessment and Plan:expected side effects L chest wall and peripheral lymphatic xrt, cont. routine    skin care, assured pt. that the dysphagia is expected, and will likely improve in the weeks  following completion of radiation.  Cont routine skin care, cont. xrt  as planned   Portal imaging reviewed and approved.              This document is electronically Approved by Sammie Bench, M.D.           Date:  09/07/2019 at 4:15   PM.        cc:  Noreene Filbert

## 2019-09-08 ENCOUNTER — Encounter (HOSPITAL_BASED_OUTPATIENT_CLINIC_OR_DEPARTMENT_OTHER): Payer: Commercial Managed Care - PPO

## 2019-09-10 ENCOUNTER — Ambulatory Visit: Payer: Commercial Managed Care - PPO | Attending: Physician Assistant

## 2019-09-10 DIAGNOSIS — J302 Other seasonal allergic rhinitis: Secondary | ICD-10-CM | POA: Insufficient documentation

## 2019-09-10 DIAGNOSIS — C50812 Malignant neoplasm of overlapping sites of left female breast: Secondary | ICD-10-CM | POA: Insufficient documentation

## 2019-09-10 DIAGNOSIS — M25562 Pain in left knee: Secondary | ICD-10-CM | POA: Insufficient documentation

## 2019-09-10 DIAGNOSIS — Z170 Estrogen receptor positive status [ER+]: Secondary | ICD-10-CM | POA: Insufficient documentation

## 2019-09-10 DIAGNOSIS — R197 Diarrhea, unspecified: Secondary | ICD-10-CM | POA: Insufficient documentation

## 2019-09-10 DIAGNOSIS — Z5112 Encounter for antineoplastic immunotherapy: Secondary | ICD-10-CM | POA: Insufficient documentation

## 2019-09-10 DIAGNOSIS — M25561 Pain in right knee: Secondary | ICD-10-CM | POA: Insufficient documentation

## 2019-09-10 DIAGNOSIS — C773 Secondary and unspecified malignant neoplasm of axilla and upper limb lymph nodes: Secondary | ICD-10-CM | POA: Insufficient documentation

## 2019-09-11 ENCOUNTER — Ambulatory Visit: Payer: Commercial Managed Care - PPO | Attending: Radiation Oncology

## 2019-09-11 DIAGNOSIS — C50412 Malignant neoplasm of upper-outer quadrant of left female breast: Secondary | ICD-10-CM

## 2019-09-11 DIAGNOSIS — Z510 Encounter for antineoplastic radiation therapy: Secondary | ICD-10-CM | POA: Insufficient documentation

## 2019-09-11 DIAGNOSIS — Z170 Estrogen receptor positive status [ER+]: Secondary | ICD-10-CM

## 2019-09-11 NOTE — Weekly On Treatment Visit (Signed)
Patient Name: Yolanda Bowers page 2/2              [facility] Radiation Oncology  at [facility]  [address] [place]  [address]        Patient:   Yolanda Bowers  MRN:   [id number]        Attending:    Sammie Bench        Referring:    Marella Chimes  Date of Service:   [id number]            Radiation Oncology On Treatment Weekly Visit  Treatment Site                                                Current Dose  L SCF                                                              2,200 cGy  L cw, ax, imc, bol                                                 2,200 cGy  Subjective:    49 y.o L breast GIII idc triple + IIC [id number] s/p neoadj.dd AC, THP s/p L mastectomy, snb, with   single focus of angiolymphatic involvement les than 67mm, no residual CA, 5 neg. sentinel lymph   nodes  plan:  L chest wall and peripheral lymphatic xrt 50Gy in 25 fractions, no boost  Endorses fatigue, improved by reducing work hours,  mild chest wall discomfort relieved with   lotions        Physical Exam:     Vitals:   Record date - 08/31/2019 3:30 PM  Vital Signs, Weight and PS  Temp (F) - 35.9  Pulse - 82  Resp - 16  B/P (mmHg) - 109/70  O2 Sat - 100  Room Air - Yes  Allergies  Allergies Reviewed - Yes  Fall Screen  Fallen in the last month - No  Pain Assessment  Having Pain - No  Skin  Skin Assessment - Bright erythema      RTOG Acute Skin Toxicity Grade:  r  0 -  No change over baseline  xr  1 -  Follicular, faint or dull erythema/epilation/dry desquamation/decreased sweating  r  2 -  Tender or bright erythema, patchy moist desquamation/moderate edema  r  3 -  Confluent, moist desquamation other than skin folds, pitting edema  r  4 -  Ulceration, hemorrhage, necrosis     Laboratory Data:           Assessment and Plan:reviewed dosimetry plan with patient, tolerating treatment well with expected   side effects, cont. routine skin care, cont. treatment as planned   Portal imaging reviewed and approved.               This document is electronically Approved by Sammie Bench, M.D.  Date:  09/11/2019 at 11:08   AM.        cc:  Noreene Filbert

## 2019-09-12 ENCOUNTER — Ambulatory Visit: Payer: Commercial Managed Care - PPO | Attending: Radiation Oncology

## 2019-09-12 DIAGNOSIS — Z170 Estrogen receptor positive status [ER+]: Secondary | ICD-10-CM

## 2019-09-12 DIAGNOSIS — C50412 Malignant neoplasm of upper-outer quadrant of left female breast: Secondary | ICD-10-CM

## 2019-09-12 DIAGNOSIS — Z510 Encounter for antineoplastic radiation therapy: Secondary | ICD-10-CM | POA: Insufficient documentation

## 2019-09-13 ENCOUNTER — Ambulatory Visit: Payer: Commercial Managed Care - PPO | Attending: Radiation Oncology

## 2019-09-13 DIAGNOSIS — Z170 Estrogen receptor positive status [ER+]: Secondary | ICD-10-CM | POA: Insufficient documentation

## 2019-09-13 DIAGNOSIS — C50412 Malignant neoplasm of upper-outer quadrant of left female breast: Secondary | ICD-10-CM | POA: Insufficient documentation

## 2019-09-13 DIAGNOSIS — Z510 Encounter for antineoplastic radiation therapy: Secondary | ICD-10-CM | POA: Insufficient documentation

## 2019-09-14 ENCOUNTER — Encounter (HOSPITAL_BASED_OUTPATIENT_CLINIC_OR_DEPARTMENT_OTHER): Payer: Self-pay | Admitting: Radiation Oncology

## 2019-09-14 ENCOUNTER — Other Ambulatory Visit (HOSPITAL_COMMUNITY): Payer: Self-pay | Admitting: Radiation Oncology

## 2019-09-14 ENCOUNTER — Ambulatory Visit (HOSPITAL_BASED_OUTPATIENT_CLINIC_OR_DEPARTMENT_OTHER): Payer: Commercial Managed Care - PPO | Admitting: Radiation Oncology

## 2019-09-14 ENCOUNTER — Ambulatory Visit: Payer: Commercial Managed Care - PPO | Attending: Radiation Oncology

## 2019-09-14 DIAGNOSIS — Z510 Encounter for antineoplastic radiation therapy: Secondary | ICD-10-CM | POA: Insufficient documentation

## 2019-09-14 DIAGNOSIS — C50412 Malignant neoplasm of upper-outer quadrant of left female breast: Secondary | ICD-10-CM

## 2019-09-14 DIAGNOSIS — Z170 Estrogen receptor positive status [ER+]: Secondary | ICD-10-CM

## 2019-09-14 MED ORDER — SILVER SULFADIAZINE 1 % EX CREA
TOPICAL_CREAM | CUTANEOUS | 1 refills | Status: DC
Start: 2019-09-14 — End: 2019-11-22

## 2019-09-14 NOTE — Weekly On Treatment Visit (Signed)
Patient Name: Yolanda Bowers page 1/2              [facility] Radiation Oncology  at [facility]  [address], [place]  [address]        Patient:   Yolanda Bowers  MRN:   [id number]        Attending:    Sammie Bench        Referring:    Marella Chimes  Date of Service:   JAN 22,2021            Radiation Oncology On Treatment Weekly Visit  Treatment Site                                                Current Dose  L SCF                                                             4,000 cGy  L cw, ax, imc, bol                                                 4,000 cGy  Subjective:    49 y.o L breast GIII idc triple + IIC [id number] s/p neoadj.dd AC, THP s/p L mastectomy, snb, with   single focus of angiolymphatic involvement les than 52mm, no residual CA, 5 neg. sentinel lymph   nodes  plan:  L chest wall and peripheral lymphatic xrt 50Gy in 25 fractions, no boost  Pt. 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  Dysphagia has improved.  Notes peeling of skin at axilla, not painful, using  lotions as directed        Physical Exam:     Vitals:   T 36 p61 r18 bp122/68     RTOG Acute Skin Toxicity Grade:  r  0 -  No change over baseline  r  1 -  Follicular, faint or dull erythema/epilation/dry desquamation/decreased sweating  xr  2 -  Tender or bright erythema, patchy moist desquamation/moderate edema  r  3 -  Confluent, moist desquamation other than skin folds, pitting edema  r  4 -  Ulceration, hemorrhage, necrosis     Laboratory Data:           Assessment and Plan: expected side effects L chest/peripheral lymphatic xrt.  Will electronically   prescribe silvadene, advised that bolus would be removed for final 5 fractions permitting skin to  start healing.  Cont. treatment as planned   Portal imaging reviewed and approved.               This document is electronically Approved by Sammie Bench, M.D.           Date:  09/14/2019 at 4:28   PM.        cc:  Noreene Filbert

## 2019-09-17 ENCOUNTER — Ambulatory Visit: Payer: Commercial Managed Care - PPO | Attending: Radiation Oncology

## 2019-09-17 ENCOUNTER — Ambulatory Visit (HOSPITAL_BASED_OUTPATIENT_CLINIC_OR_DEPARTMENT_OTHER): Payer: Commercial Managed Care - PPO | Admitting: Acupuncturist

## 2019-09-17 DIAGNOSIS — R5383 Other fatigue: Secondary | ICD-10-CM | POA: Insufficient documentation

## 2019-09-17 DIAGNOSIS — C773 Secondary and unspecified malignant neoplasm of axilla and upper limb lymph nodes: Secondary | ICD-10-CM | POA: Insufficient documentation

## 2019-09-17 DIAGNOSIS — M542 Cervicalgia: Secondary | ICD-10-CM | POA: Insufficient documentation

## 2019-09-17 DIAGNOSIS — T451X5A Adverse effect of antineoplastic and immunosuppressive drugs, initial encounter: Secondary | ICD-10-CM | POA: Insufficient documentation

## 2019-09-17 DIAGNOSIS — C50812 Malignant neoplasm of overlapping sites of left female breast: Secondary | ICD-10-CM | POA: Insufficient documentation

## 2019-09-17 DIAGNOSIS — M546 Pain in thoracic spine: Secondary | ICD-10-CM | POA: Insufficient documentation

## 2019-09-17 DIAGNOSIS — M25562 Pain in left knee: Secondary | ICD-10-CM | POA: Insufficient documentation

## 2019-09-17 DIAGNOSIS — C50412 Malignant neoplasm of upper-outer quadrant of left female breast: Secondary | ICD-10-CM

## 2019-09-17 DIAGNOSIS — R61 Generalized hyperhidrosis: Secondary | ICD-10-CM | POA: Insufficient documentation

## 2019-09-17 DIAGNOSIS — Z170 Estrogen receptor positive status [ER+]: Secondary | ICD-10-CM | POA: Insufficient documentation

## 2019-09-17 DIAGNOSIS — Z510 Encounter for antineoplastic radiation therapy: Secondary | ICD-10-CM | POA: Insufficient documentation

## 2019-09-17 DIAGNOSIS — G620 Drug-induced polyneuropathy: Secondary | ICD-10-CM | POA: Insufficient documentation

## 2019-09-17 DIAGNOSIS — R197 Diarrhea, unspecified: Secondary | ICD-10-CM | POA: Insufficient documentation

## 2019-09-17 DIAGNOSIS — M25561 Pain in right knee: Secondary | ICD-10-CM | POA: Insufficient documentation

## 2019-09-17 DIAGNOSIS — M1990 Unspecified osteoarthritis, unspecified site: Secondary | ICD-10-CM | POA: Insufficient documentation

## 2019-09-17 DIAGNOSIS — N951 Menopausal and female climacteric states: Secondary | ICD-10-CM | POA: Insufficient documentation

## 2019-09-17 DIAGNOSIS — R635 Abnormal weight gain: Secondary | ICD-10-CM | POA: Insufficient documentation

## 2019-09-17 DIAGNOSIS — R110 Nausea: Secondary | ICD-10-CM | POA: Insufficient documentation

## 2019-09-18 ENCOUNTER — Encounter (HOSPITAL_BASED_OUTPATIENT_CLINIC_OR_DEPARTMENT_OTHER): Payer: Self-pay | Admitting: Radiation Oncology

## 2019-09-18 ENCOUNTER — Ambulatory Visit: Payer: Commercial Managed Care - PPO | Attending: Radiation Oncology

## 2019-09-18 ENCOUNTER — Ambulatory Visit (HOSPITAL_BASED_OUTPATIENT_CLINIC_OR_DEPARTMENT_OTHER): Payer: Commercial Managed Care - PPO | Admitting: Radiation Oncology

## 2019-09-18 DIAGNOSIS — Z170 Estrogen receptor positive status [ER+]: Secondary | ICD-10-CM | POA: Insufficient documentation

## 2019-09-18 DIAGNOSIS — C50412 Malignant neoplasm of upper-outer quadrant of left female breast: Secondary | ICD-10-CM | POA: Insufficient documentation

## 2019-09-18 DIAGNOSIS — Z510 Encounter for antineoplastic radiation therapy: Secondary | ICD-10-CM | POA: Insufficient documentation

## 2019-09-18 NOTE — Weekly On Treatment Visit (Signed)
Patient Name: Yolanda Bowers page 1/2              [facility] Radiation Oncology  at [facility]  [address] [place]  [address]        Patient:   Yolanda Bowers, Yolanda Bowers  MRN:   [id number]        Attending:    Sammie Bench        Referring:    Marella Chimes  Date of Service:   [id number]            Radiation Oncology On Treatment Weekly Visit     Treatment Site                                                Current Dose  L SCF                                                             4,400 cGy  L cw, ax, imc, bol                                                4,000 cGy  L cw/ax/imc/no bol                                                   400 cGy  Subjective:    49 y.o L breast GIII idc triple + IIC [id number] s/p neoadj.dd AC, THP s/p L mastectomy, snb, with   single focus of angiolymphatic involvement les than 27mm, no residual CA, 5 neg. sentinel lymph   nodes  plan:  L chest wall and peripheral lymphatic xrt 50Gy in 25 fractions, no boost  Pt. emotionally and physically exhausted, radiation skin reaction interfering with her sleep, has   some narcotics, Ativan from post-op meds. But hasn't taken any, using ibuprofen for pain.  Hydrogel  sheets help, also noted dry cough this am, has not retrieved silvadene ([facility])        Physical Exam:     Vitals:   Record date - 09/18/2019 4:08 PM  Vital Signs, Weight and PS  Temp (F) - 36.5  Pulse - 67  Resp - 16  B/P (mmHg) - 123/77  O2 Sat - 97  Room Air - Yes  Allergies  Allergies Reviewed - Yes  Fall Screen  Fallen in the last month - No  Pain Assessment  Having Pain - Yes  Location of pain - L chest and axilla  Pain Descriptor Scale - Moderate  Descriptor of pain per patient - Burning  Relieving/Aggravating Factors - movement, to  Analgesics: Type/ Schedule - Ibuprofen  Skin  Skin Assessment - Dry desquamation with or without erythema      RTOG Acute Skin Toxicity Grade:  r  0 -  No change over baseline  r  1 -  Follicular, faint or dull  erythema/epilation/dry desquamation/decreased sweating  r  2 -  Tender or bright erythema, patchy moist desquamation/moderate edema  xr  3 -  Confluent, moist desquamation other than skin folds, pitting edema  r  4 -  Ulceration, hemorrhage, necrosis     Laboratory Data:           Assessment and Plan:moist desquamation, fatigue, dysphagia, cough, due to xrt.  Offered to give   treatment break, and have her complete treatment next week with 1-3 fractions remaining, encouraged  her to take pain medication at least at night, as everything seems worse after poor sleep, advised   her to retrieve prescription silvadene and start using it at least bid to all affected areas.   Offered reassurance that this is a challenging time for most patients as they complete radiation,   and may worsen in the week after completion, but almost everyone notes improvement in symptoms by  2nd week after completion.  She will let us know if she wishes to protract her treatment or simply   be done this Friday.  Also encouraged her to take time off work so it's one less thing on her  plate. Cont. final 3 fractions as planned   Portal imaging reviewed and approved.              This document is electronically Approved by Sammie Bench, M.D.           Date:  09/18/2019 at 4:35   PM.        cc:  Noreene Filbert

## 2019-09-19 ENCOUNTER — Ambulatory Visit: Payer: Commercial Managed Care - PPO | Attending: Radiation Oncology

## 2019-09-19 DIAGNOSIS — C50412 Malignant neoplasm of upper-outer quadrant of left female breast: Secondary | ICD-10-CM | POA: Insufficient documentation

## 2019-09-19 DIAGNOSIS — Z170 Estrogen receptor positive status [ER+]: Secondary | ICD-10-CM | POA: Insufficient documentation

## 2019-09-19 DIAGNOSIS — Z510 Encounter for antineoplastic radiation therapy: Secondary | ICD-10-CM | POA: Insufficient documentation

## 2019-09-20 ENCOUNTER — Ambulatory Visit: Payer: Commercial Managed Care - PPO | Attending: Radiation Oncology

## 2019-09-20 DIAGNOSIS — C50412 Malignant neoplasm of upper-outer quadrant of left female breast: Secondary | ICD-10-CM | POA: Insufficient documentation

## 2019-09-20 DIAGNOSIS — Z510 Encounter for antineoplastic radiation therapy: Secondary | ICD-10-CM | POA: Insufficient documentation

## 2019-09-20 DIAGNOSIS — Z170 Estrogen receptor positive status [ER+]: Secondary | ICD-10-CM | POA: Insufficient documentation

## 2019-09-21 ENCOUNTER — Ambulatory Visit: Payer: Commercial Managed Care - PPO | Attending: Radiation Oncology

## 2019-09-21 DIAGNOSIS — Z170 Estrogen receptor positive status [ER+]: Secondary | ICD-10-CM

## 2019-09-21 DIAGNOSIS — Z510 Encounter for antineoplastic radiation therapy: Secondary | ICD-10-CM | POA: Insufficient documentation

## 2019-09-21 DIAGNOSIS — C50412 Malignant neoplasm of upper-outer quadrant of left female breast: Secondary | ICD-10-CM

## 2019-09-24 ENCOUNTER — Ambulatory Visit (HOSPITAL_BASED_OUTPATIENT_CLINIC_OR_DEPARTMENT_OTHER): Payer: Commercial Managed Care - PPO | Admitting: Nurse Practitioner

## 2019-09-24 ENCOUNTER — Ambulatory Visit (HOSPITAL_BASED_OUTPATIENT_CLINIC_OR_DEPARTMENT_OTHER): Payer: Commercial Managed Care - PPO

## 2019-09-24 ENCOUNTER — Ambulatory Visit: Payer: Commercial Managed Care - PPO | Attending: Physician Assistant | Admitting: Acupuncturist

## 2019-09-24 DIAGNOSIS — Z09 Encounter for follow-up examination after completed treatment for conditions other than malignant neoplasm: Secondary | ICD-10-CM

## 2019-09-24 DIAGNOSIS — Z9012 Acquired absence of left breast and nipple: Secondary | ICD-10-CM | POA: Insufficient documentation

## 2019-09-24 DIAGNOSIS — R5383 Other fatigue: Secondary | ICD-10-CM | POA: Insufficient documentation

## 2019-09-24 DIAGNOSIS — M25561 Pain in right knee: Secondary | ICD-10-CM | POA: Insufficient documentation

## 2019-09-24 DIAGNOSIS — G620 Drug-induced polyneuropathy: Secondary | ICD-10-CM | POA: Insufficient documentation

## 2019-09-24 DIAGNOSIS — N951 Menopausal and female climacteric states: Secondary | ICD-10-CM | POA: Insufficient documentation

## 2019-09-24 DIAGNOSIS — Z9089 Acquired absence of other organs: Secondary | ICD-10-CM | POA: Insufficient documentation

## 2019-09-24 DIAGNOSIS — R197 Diarrhea, unspecified: Secondary | ICD-10-CM | POA: Insufficient documentation

## 2019-09-24 DIAGNOSIS — C773 Secondary and unspecified malignant neoplasm of axilla and upper limb lymph nodes: Secondary | ICD-10-CM | POA: Insufficient documentation

## 2019-09-24 DIAGNOSIS — M2550 Pain in unspecified joint: Secondary | ICD-10-CM | POA: Insufficient documentation

## 2019-09-24 DIAGNOSIS — R110 Nausea: Secondary | ICD-10-CM | POA: Insufficient documentation

## 2019-09-24 DIAGNOSIS — Z08 Encounter for follow-up examination after completed treatment for malignant neoplasm: Secondary | ICD-10-CM

## 2019-09-24 DIAGNOSIS — Z79811 Long term (current) use of aromatase inhibitors: Secondary | ICD-10-CM | POA: Insufficient documentation

## 2019-09-24 DIAGNOSIS — T451X5A Adverse effect of antineoplastic and immunosuppressive drugs, initial encounter: Secondary | ICD-10-CM | POA: Insufficient documentation

## 2019-09-24 DIAGNOSIS — R519 Headache, unspecified: Secondary | ICD-10-CM | POA: Insufficient documentation

## 2019-09-24 DIAGNOSIS — R635 Abnormal weight gain: Secondary | ICD-10-CM | POA: Insufficient documentation

## 2019-09-24 DIAGNOSIS — M25562 Pain in left knee: Secondary | ICD-10-CM | POA: Insufficient documentation

## 2019-09-24 DIAGNOSIS — C50812 Malignant neoplasm of overlapping sites of left female breast: Secondary | ICD-10-CM | POA: Insufficient documentation

## 2019-09-24 DIAGNOSIS — R61 Generalized hyperhidrosis: Secondary | ICD-10-CM | POA: Insufficient documentation

## 2019-09-24 DIAGNOSIS — R630 Anorexia: Secondary | ICD-10-CM | POA: Insufficient documentation

## 2019-09-24 DIAGNOSIS — Z170 Estrogen receptor positive status [ER+]: Secondary | ICD-10-CM | POA: Insufficient documentation

## 2019-09-24 LAB — BASIC METABOLIC PANEL
Anion Gap: 6 (ref 4–12)
Calcium: 9.7 mg/dL (ref 8.9–10.2)
Carbon Dioxide, Total: 28 meq/L (ref 22–32)
Chloride: 106 meq/L (ref 98–108)
Creatinine: 0.74 mg/dL (ref 0.38–1.02)
Glucose: 99 mg/dL (ref 62–125)
Potassium: 3.6 meq/L (ref 3.6–5.2)
Sodium: 140 meq/L (ref 135–145)
Urea Nitrogen: 12 mg/dL (ref 8–21)
eGFR by CKD-EPI: >60 mL/min/{1.73_m2} (ref >59)

## 2019-09-24 LAB — CBC, ABS NEUTROPHIL
Hematocrit: 34 % — ABNORMAL LOW (ref 36–45)
Hemoglobin: 11.7 g/dL (ref 11.5–15.5)
Immature Granulocytes: 0 10*3/uL (ref 0.00–0.05)
MCH: 29.2 pg (ref 27.3–33.6)
MCHC: 34.1 g/dL (ref 32.2–36.5)
MCV: 86 fL (ref 81–98)
Neutrophils: 1.42 10*3/uL — ABNORMAL LOW (ref 1.80–7.00)
Platelet Count: 161 10*3/uL (ref 150–400)
RBC: 4.01 10*6/uL (ref 3.80–5.00)
RDW-CV: 12.7 % (ref 11.6–14.4)
WBC: 2.04 10*3/uL — ABNORMAL LOW (ref 4.3–10.0)

## 2019-09-24 LAB — HEPATIC FUNCTION PANEL W/ LD
ALT (GPT): 18 U/L (ref 7–33)
AST (GOT): 18 U/L (ref 9–38)
Albumin: 4.1 g/dL (ref 3.5–5.2)
Alkaline Phosphatase (Total): 36 U/L (ref 34–121)
Bilirubin (Direct): 0.1 mg/dL (ref 0.0–0.3)
Bilirubin (Total): 0.4 mg/dL (ref 0.2–1.3)
Lactate Dehydrogenase: 126 U/L (ref <210)
Protein (Total): 6.7 g/dL (ref 6.0–8.2)

## 2019-09-24 LAB — ESTRADIOL: Estradiol: <20 pg/mL

## 2019-09-25 ENCOUNTER — Encounter (HOSPITAL_BASED_OUTPATIENT_CLINIC_OR_DEPARTMENT_OTHER): Payer: Self-pay | Admitting: Radiation Oncology

## 2019-09-26 ENCOUNTER — Other Ambulatory Visit (HOSPITAL_BASED_OUTPATIENT_CLINIC_OR_DEPARTMENT_OTHER): Payer: Commercial Managed Care - PPO

## 2019-09-26 ENCOUNTER — Encounter (HOSPITAL_BASED_OUTPATIENT_CLINIC_OR_DEPARTMENT_OTHER): Payer: Commercial Managed Care - PPO | Admitting: Physician Assistant

## 2019-09-29 ENCOUNTER — Ambulatory Visit: Payer: Commercial Managed Care - PPO | Attending: Physician Assistant

## 2019-09-29 DIAGNOSIS — C50812 Malignant neoplasm of overlapping sites of left female breast: Secondary | ICD-10-CM | POA: Insufficient documentation

## 2019-09-29 DIAGNOSIS — C773 Secondary and unspecified malignant neoplasm of axilla and upper limb lymph nodes: Secondary | ICD-10-CM | POA: Insufficient documentation

## 2019-09-29 DIAGNOSIS — Z5112 Encounter for antineoplastic immunotherapy: Secondary | ICD-10-CM | POA: Insufficient documentation

## 2019-09-29 DIAGNOSIS — R5383 Other fatigue: Secondary | ICD-10-CM | POA: Insufficient documentation

## 2019-09-29 DIAGNOSIS — T2101XA Burn of unspecified degree of chest wall, initial encounter: Secondary | ICD-10-CM | POA: Insufficient documentation

## 2019-09-29 DIAGNOSIS — G5793 Unspecified mononeuropathy of bilateral lower limbs: Secondary | ICD-10-CM | POA: Insufficient documentation

## 2019-09-29 DIAGNOSIS — Z170 Estrogen receptor positive status [ER+]: Secondary | ICD-10-CM | POA: Insufficient documentation

## 2019-10-01 ENCOUNTER — Ambulatory Visit: Payer: Commercial Managed Care - PPO | Attending: Acupuncturist | Admitting: Acupuncturist

## 2019-10-01 ENCOUNTER — Encounter (HOSPITAL_BASED_OUTPATIENT_CLINIC_OR_DEPARTMENT_OTHER): Payer: Self-pay | Admitting: Nurse Practitioner

## 2019-10-01 DIAGNOSIS — R197 Diarrhea, unspecified: Secondary | ICD-10-CM | POA: Insufficient documentation

## 2019-10-01 DIAGNOSIS — R5383 Other fatigue: Secondary | ICD-10-CM | POA: Insufficient documentation

## 2019-10-01 DIAGNOSIS — Z170 Estrogen receptor positive status [ER+]: Secondary | ICD-10-CM | POA: Insufficient documentation

## 2019-10-01 DIAGNOSIS — R630 Anorexia: Secondary | ICD-10-CM | POA: Insufficient documentation

## 2019-10-01 DIAGNOSIS — M25562 Pain in left knee: Secondary | ICD-10-CM | POA: Insufficient documentation

## 2019-10-01 DIAGNOSIS — N951 Menopausal and female climacteric states: Secondary | ICD-10-CM | POA: Insufficient documentation

## 2019-10-01 DIAGNOSIS — T451X5A Adverse effect of antineoplastic and immunosuppressive drugs, initial encounter: Secondary | ICD-10-CM | POA: Insufficient documentation

## 2019-10-01 DIAGNOSIS — C50812 Malignant neoplasm of overlapping sites of left female breast: Secondary | ICD-10-CM | POA: Insufficient documentation

## 2019-10-01 DIAGNOSIS — G620 Drug-induced polyneuropathy: Secondary | ICD-10-CM | POA: Insufficient documentation

## 2019-10-01 DIAGNOSIS — Z9012 Acquired absence of left breast and nipple: Secondary | ICD-10-CM | POA: Insufficient documentation

## 2019-10-01 DIAGNOSIS — M25561 Pain in right knee: Secondary | ICD-10-CM | POA: Insufficient documentation

## 2019-10-01 DIAGNOSIS — R110 Nausea: Secondary | ICD-10-CM | POA: Insufficient documentation

## 2019-10-01 DIAGNOSIS — R61 Generalized hyperhidrosis: Secondary | ICD-10-CM | POA: Insufficient documentation

## 2019-10-01 DIAGNOSIS — M2550 Pain in unspecified joint: Secondary | ICD-10-CM | POA: Insufficient documentation

## 2019-10-01 DIAGNOSIS — R635 Abnormal weight gain: Secondary | ICD-10-CM | POA: Insufficient documentation

## 2019-10-01 DIAGNOSIS — C773 Secondary and unspecified malignant neoplasm of axilla and upper limb lymph nodes: Secondary | ICD-10-CM | POA: Insufficient documentation

## 2019-10-02 NOTE — End Of treatment Summary (Signed)
Alford Highland, Lockie L.  1/2              [facility]  Radiation Oncology  [address], [address]  [place]  [address]     Radiation Therapy Treatment Summary Note     Patient Name : Yolanda Bowers  MRN: [id number]  Diagnosis: Malignant neoplasm of upper-outer quadrant of left female breast  Diagnosis Code: C50.412  Attending: Sammie Bowers  Referring: Marella Chimes     IDENTIFICATION:  Patient is a 49 year old Female with cancer of the <  L breast GIII idc triple + IIC [id number] s/p neoadj.dd AC, THP s/p L mastectomy, snb, with single   focus of angiolymphatic involvement les than 13mm, no residual CA, 5 neg. sentinel lymph nodes plan:    L  chest wall and peripheral lymphatic xrt 50Gy in 25 fractions, no boost  DOSE:   Treatment Site                                                            Current Dose  L SCF                                                                 5,000 cGy  L chest wall, axilla, and internal mammary chain                                 5,000 cGy  RADIATION TREATMTENT PARAMETERS: Patient was taken to CT simulator where she was placed on a breast   board to aid in reproducible daily set-ups.   CT images were obtained of the neck and thorax.   These images were transferred to our treatment planning computer where a  multileaf collimated forward planning technic was devised to deliver radiation to the tumor while   sparing normal surrounding tissues.    10 MV  photons were used with an RAO field and  a daily fraction size of 2 Gy for 25            fractions giving a total dose of 50 Gy to the left supraclavicular fossa.  The left chest wall,   axilla, and internal mammary chain was treated through tangential fields using a combination of 6MV  and 10MV photons for 25 fractions of 2Gy each.  0.5 cm bolus was placed on chest wall for the first   20 fractions to ensure adequate skin dose.  Deep inspiratory breath hold technic was used to  improve cardiac  sparing.  Pt. developed radiation dermatitis towards the end of treatment with patchy areas of moist   desquamation.  This was treated with local measures in addition to topical silvadene  CONCURRENT CHEMO: no              DISPOSITION/FOLLOW UP:  Cont. systemic targeted therapy with Dr. Charna Archer.  f/u rad. Onc. As needed.  Anticipate radiation   dermatitis to be self limiting in the weeks following completion of xrt.  Resident's Name:      Attending Presence Statement:  I personally evaluated and examined this patient today with the   resident physician and concur with the stated findings, assessment, and plan.      This document is electronically Approved by Yolanda Bowers, M.D. on 10/02/2019 at 11:55 AM     cc:  Noreene Filbert

## 2019-10-09 ENCOUNTER — Encounter (HOSPITAL_BASED_OUTPATIENT_CLINIC_OR_DEPARTMENT_OTHER): Payer: Self-pay

## 2019-10-09 ENCOUNTER — Ambulatory Visit: Payer: Commercial Managed Care - PPO | Attending: Acupuncturist | Admitting: Acupuncturist

## 2019-10-09 DIAGNOSIS — R61 Generalized hyperhidrosis: Secondary | ICD-10-CM | POA: Insufficient documentation

## 2019-10-09 DIAGNOSIS — Z5112 Encounter for antineoplastic immunotherapy: Secondary | ICD-10-CM | POA: Insufficient documentation

## 2019-10-09 DIAGNOSIS — C50812 Malignant neoplasm of overlapping sites of left female breast: Secondary | ICD-10-CM | POA: Insufficient documentation

## 2019-10-09 DIAGNOSIS — R197 Diarrhea, unspecified: Secondary | ICD-10-CM | POA: Insufficient documentation

## 2019-10-09 DIAGNOSIS — M791 Myalgia, unspecified site: Secondary | ICD-10-CM | POA: Insufficient documentation

## 2019-10-09 DIAGNOSIS — C773 Secondary and unspecified malignant neoplasm of axilla and upper limb lymph nodes: Secondary | ICD-10-CM | POA: Insufficient documentation

## 2019-10-09 DIAGNOSIS — R630 Anorexia: Secondary | ICD-10-CM | POA: Insufficient documentation

## 2019-10-09 DIAGNOSIS — F419 Anxiety disorder, unspecified: Secondary | ICD-10-CM | POA: Insufficient documentation

## 2019-10-09 DIAGNOSIS — T481X5D Adverse effect of skeletal muscle relaxants [neuromuscular blocking agents], subsequent encounter: Secondary | ICD-10-CM | POA: Insufficient documentation

## 2019-10-09 DIAGNOSIS — R635 Abnormal weight gain: Secondary | ICD-10-CM | POA: Insufficient documentation

## 2019-10-09 DIAGNOSIS — M25562 Pain in left knee: Secondary | ICD-10-CM | POA: Insufficient documentation

## 2019-10-09 DIAGNOSIS — M2550 Pain in unspecified joint: Secondary | ICD-10-CM | POA: Insufficient documentation

## 2019-10-09 DIAGNOSIS — R110 Nausea: Secondary | ICD-10-CM | POA: Insufficient documentation

## 2019-10-09 DIAGNOSIS — R5383 Other fatigue: Secondary | ICD-10-CM | POA: Insufficient documentation

## 2019-10-09 DIAGNOSIS — Z170 Estrogen receptor positive status [ER+]: Secondary | ICD-10-CM | POA: Insufficient documentation

## 2019-10-09 DIAGNOSIS — N951 Menopausal and female climacteric states: Secondary | ICD-10-CM | POA: Insufficient documentation

## 2019-10-09 DIAGNOSIS — M25561 Pain in right knee: Secondary | ICD-10-CM | POA: Insufficient documentation

## 2019-10-09 DIAGNOSIS — G620 Drug-induced polyneuropathy: Secondary | ICD-10-CM | POA: Insufficient documentation

## 2019-10-15 ENCOUNTER — Ambulatory Visit: Payer: Commercial Managed Care - PPO | Attending: Acupuncturist | Admitting: Acupuncturist

## 2019-10-15 DIAGNOSIS — R630 Anorexia: Secondary | ICD-10-CM | POA: Insufficient documentation

## 2019-10-15 DIAGNOSIS — R197 Diarrhea, unspecified: Secondary | ICD-10-CM | POA: Insufficient documentation

## 2019-10-15 DIAGNOSIS — R635 Abnormal weight gain: Secondary | ICD-10-CM | POA: Insufficient documentation

## 2019-10-15 DIAGNOSIS — R110 Nausea: Secondary | ICD-10-CM | POA: Insufficient documentation

## 2019-10-15 DIAGNOSIS — M25551 Pain in right hip: Secondary | ICD-10-CM | POA: Insufficient documentation

## 2019-10-15 DIAGNOSIS — M542 Cervicalgia: Secondary | ICD-10-CM | POA: Insufficient documentation

## 2019-10-15 DIAGNOSIS — N951 Menopausal and female climacteric states: Secondary | ICD-10-CM | POA: Insufficient documentation

## 2019-10-15 DIAGNOSIS — C50812 Malignant neoplasm of overlapping sites of left female breast: Secondary | ICD-10-CM | POA: Insufficient documentation

## 2019-10-15 DIAGNOSIS — R61 Generalized hyperhidrosis: Secondary | ICD-10-CM | POA: Insufficient documentation

## 2019-10-15 DIAGNOSIS — R5383 Other fatigue: Secondary | ICD-10-CM | POA: Insufficient documentation

## 2019-10-15 DIAGNOSIS — C773 Secondary and unspecified malignant neoplasm of axilla and upper limb lymph nodes: Secondary | ICD-10-CM | POA: Insufficient documentation

## 2019-10-15 DIAGNOSIS — M2550 Pain in unspecified joint: Secondary | ICD-10-CM | POA: Insufficient documentation

## 2019-10-15 DIAGNOSIS — M1990 Unspecified osteoarthritis, unspecified site: Secondary | ICD-10-CM | POA: Insufficient documentation

## 2019-10-15 DIAGNOSIS — F419 Anxiety disorder, unspecified: Secondary | ICD-10-CM | POA: Insufficient documentation

## 2019-10-15 DIAGNOSIS — T451X5A Adverse effect of antineoplastic and immunosuppressive drugs, initial encounter: Secondary | ICD-10-CM | POA: Insufficient documentation

## 2019-10-15 DIAGNOSIS — M546 Pain in thoracic spine: Secondary | ICD-10-CM | POA: Insufficient documentation

## 2019-10-15 DIAGNOSIS — G620 Drug-induced polyneuropathy: Secondary | ICD-10-CM | POA: Insufficient documentation

## 2019-10-15 DIAGNOSIS — M25561 Pain in right knee: Secondary | ICD-10-CM | POA: Insufficient documentation

## 2019-10-15 DIAGNOSIS — Z170 Estrogen receptor positive status [ER+]: Secondary | ICD-10-CM | POA: Insufficient documentation

## 2019-10-15 DIAGNOSIS — M25562 Pain in left knee: Secondary | ICD-10-CM | POA: Insufficient documentation

## 2019-10-20 ENCOUNTER — Ambulatory Visit: Payer: Commercial Managed Care - PPO | Attending: Physician Assistant

## 2019-10-20 DIAGNOSIS — Z170 Estrogen receptor positive status [ER+]: Secondary | ICD-10-CM | POA: Insufficient documentation

## 2019-10-20 DIAGNOSIS — R197 Diarrhea, unspecified: Secondary | ICD-10-CM | POA: Insufficient documentation

## 2019-10-20 DIAGNOSIS — C50812 Malignant neoplasm of overlapping sites of left female breast: Secondary | ICD-10-CM | POA: Insufficient documentation

## 2019-10-20 DIAGNOSIS — M2550 Pain in unspecified joint: Secondary | ICD-10-CM | POA: Insufficient documentation

## 2019-10-20 DIAGNOSIS — M898X9 Other specified disorders of bone, unspecified site: Secondary | ICD-10-CM | POA: Insufficient documentation

## 2019-10-20 DIAGNOSIS — G5693 Unspecified mononeuropathy of bilateral upper limbs: Secondary | ICD-10-CM | POA: Insufficient documentation

## 2019-10-20 DIAGNOSIS — Z5112 Encounter for antineoplastic immunotherapy: Secondary | ICD-10-CM | POA: Insufficient documentation

## 2019-10-20 DIAGNOSIS — C773 Secondary and unspecified malignant neoplasm of axilla and upper limb lymph nodes: Secondary | ICD-10-CM | POA: Insufficient documentation

## 2019-10-22 ENCOUNTER — Ambulatory Visit: Payer: Commercial Managed Care - PPO | Attending: Acupuncturist | Admitting: Acupuncturist

## 2019-10-22 DIAGNOSIS — R630 Anorexia: Secondary | ICD-10-CM | POA: Insufficient documentation

## 2019-10-22 DIAGNOSIS — R197 Diarrhea, unspecified: Secondary | ICD-10-CM | POA: Insufficient documentation

## 2019-10-22 DIAGNOSIS — G620 Drug-induced polyneuropathy: Secondary | ICD-10-CM | POA: Insufficient documentation

## 2019-10-22 DIAGNOSIS — R079 Chest pain, unspecified: Secondary | ICD-10-CM | POA: Insufficient documentation

## 2019-10-22 DIAGNOSIS — R635 Abnormal weight gain: Secondary | ICD-10-CM | POA: Insufficient documentation

## 2019-10-22 DIAGNOSIS — C773 Secondary and unspecified malignant neoplasm of axilla and upper limb lymph nodes: Secondary | ICD-10-CM | POA: Insufficient documentation

## 2019-10-22 DIAGNOSIS — Z170 Estrogen receptor positive status [ER+]: Secondary | ICD-10-CM | POA: Insufficient documentation

## 2019-10-22 DIAGNOSIS — R110 Nausea: Secondary | ICD-10-CM | POA: Insufficient documentation

## 2019-10-22 DIAGNOSIS — N951 Menopausal and female climacteric states: Secondary | ICD-10-CM | POA: Insufficient documentation

## 2019-10-22 DIAGNOSIS — C50812 Malignant neoplasm of overlapping sites of left female breast: Secondary | ICD-10-CM | POA: Insufficient documentation

## 2019-10-22 DIAGNOSIS — R61 Generalized hyperhidrosis: Secondary | ICD-10-CM | POA: Insufficient documentation

## 2019-10-22 DIAGNOSIS — T451X5D Adverse effect of antineoplastic and immunosuppressive drugs, subsequent encounter: Secondary | ICD-10-CM | POA: Insufficient documentation

## 2019-10-22 DIAGNOSIS — M2550 Pain in unspecified joint: Secondary | ICD-10-CM | POA: Insufficient documentation

## 2019-10-22 DIAGNOSIS — R5383 Other fatigue: Secondary | ICD-10-CM | POA: Insufficient documentation

## 2019-10-24 ENCOUNTER — Ambulatory Visit: Payer: Commercial Managed Care - PPO

## 2019-10-29 ENCOUNTER — Ambulatory Visit: Payer: Commercial Managed Care - PPO | Attending: Acupuncturist | Admitting: Acupuncturist

## 2019-10-29 DIAGNOSIS — M25511 Pain in right shoulder: Secondary | ICD-10-CM | POA: Insufficient documentation

## 2019-10-29 DIAGNOSIS — T451X5A Adverse effect of antineoplastic and immunosuppressive drugs, initial encounter: Secondary | ICD-10-CM | POA: Insufficient documentation

## 2019-10-29 DIAGNOSIS — M25562 Pain in left knee: Secondary | ICD-10-CM | POA: Insufficient documentation

## 2019-10-29 DIAGNOSIS — R61 Generalized hyperhidrosis: Secondary | ICD-10-CM | POA: Insufficient documentation

## 2019-10-29 DIAGNOSIS — C50812 Malignant neoplasm of overlapping sites of left female breast: Secondary | ICD-10-CM | POA: Insufficient documentation

## 2019-10-29 DIAGNOSIS — F419 Anxiety disorder, unspecified: Secondary | ICD-10-CM | POA: Insufficient documentation

## 2019-10-29 DIAGNOSIS — N951 Menopausal and female climacteric states: Secondary | ICD-10-CM | POA: Insufficient documentation

## 2019-10-29 DIAGNOSIS — M25561 Pain in right knee: Secondary | ICD-10-CM | POA: Insufficient documentation

## 2019-10-29 DIAGNOSIS — R5383 Other fatigue: Secondary | ICD-10-CM | POA: Insufficient documentation

## 2019-10-29 DIAGNOSIS — G620 Drug-induced polyneuropathy: Secondary | ICD-10-CM | POA: Insufficient documentation

## 2019-10-29 DIAGNOSIS — M542 Cervicalgia: Secondary | ICD-10-CM | POA: Insufficient documentation

## 2019-10-29 DIAGNOSIS — Z9012 Acquired absence of left breast and nipple: Secondary | ICD-10-CM | POA: Insufficient documentation

## 2019-10-29 DIAGNOSIS — M25551 Pain in right hip: Secondary | ICD-10-CM | POA: Insufficient documentation

## 2019-10-29 DIAGNOSIS — R197 Diarrhea, unspecified: Secondary | ICD-10-CM | POA: Insufficient documentation

## 2019-10-29 DIAGNOSIS — R630 Anorexia: Secondary | ICD-10-CM | POA: Insufficient documentation

## 2019-10-29 DIAGNOSIS — R110 Nausea: Secondary | ICD-10-CM | POA: Insufficient documentation

## 2019-10-29 DIAGNOSIS — Z170 Estrogen receptor positive status [ER+]: Secondary | ICD-10-CM | POA: Insufficient documentation

## 2019-10-29 DIAGNOSIS — M546 Pain in thoracic spine: Secondary | ICD-10-CM | POA: Insufficient documentation

## 2019-10-29 DIAGNOSIS — M2550 Pain in unspecified joint: Secondary | ICD-10-CM | POA: Insufficient documentation

## 2019-10-29 DIAGNOSIS — R635 Abnormal weight gain: Secondary | ICD-10-CM | POA: Insufficient documentation

## 2019-10-29 DIAGNOSIS — C773 Secondary and unspecified malignant neoplasm of axilla and upper limb lymph nodes: Secondary | ICD-10-CM | POA: Insufficient documentation

## 2019-10-31 NOTE — Progress Notes (Signed)
 Agree with treatment plan as outlined.  Signed electronically and authenticated by:    Wanda RONAL Ruth, MD, FACS  10/31/2019; 10:05 AM PST

## 2019-11-05 ENCOUNTER — Ambulatory Visit: Payer: Commercial Managed Care - PPO | Attending: Acupuncturist | Admitting: Acupuncturist

## 2019-11-05 DIAGNOSIS — R61 Generalized hyperhidrosis: Secondary | ICD-10-CM | POA: Insufficient documentation

## 2019-11-05 DIAGNOSIS — M25562 Pain in left knee: Secondary | ICD-10-CM | POA: Insufficient documentation

## 2019-11-05 DIAGNOSIS — R197 Diarrhea, unspecified: Secondary | ICD-10-CM | POA: Insufficient documentation

## 2019-11-05 DIAGNOSIS — R630 Anorexia: Secondary | ICD-10-CM | POA: Insufficient documentation

## 2019-11-05 DIAGNOSIS — M546 Pain in thoracic spine: Secondary | ICD-10-CM | POA: Insufficient documentation

## 2019-11-05 DIAGNOSIS — M542 Cervicalgia: Secondary | ICD-10-CM | POA: Insufficient documentation

## 2019-11-05 DIAGNOSIS — R5383 Other fatigue: Secondary | ICD-10-CM | POA: Insufficient documentation

## 2019-11-05 DIAGNOSIS — C50812 Malignant neoplasm of overlapping sites of left female breast: Secondary | ICD-10-CM | POA: Insufficient documentation

## 2019-11-05 DIAGNOSIS — R635 Abnormal weight gain: Secondary | ICD-10-CM | POA: Insufficient documentation

## 2019-11-05 DIAGNOSIS — G620 Drug-induced polyneuropathy: Secondary | ICD-10-CM | POA: Insufficient documentation

## 2019-11-05 DIAGNOSIS — M2550 Pain in unspecified joint: Secondary | ICD-10-CM | POA: Insufficient documentation

## 2019-11-05 DIAGNOSIS — R110 Nausea: Secondary | ICD-10-CM | POA: Insufficient documentation

## 2019-11-05 DIAGNOSIS — F419 Anxiety disorder, unspecified: Secondary | ICD-10-CM | POA: Insufficient documentation

## 2019-11-05 DIAGNOSIS — T451X5D Adverse effect of antineoplastic and immunosuppressive drugs, subsequent encounter: Secondary | ICD-10-CM | POA: Insufficient documentation

## 2019-11-05 DIAGNOSIS — N951 Menopausal and female climacteric states: Secondary | ICD-10-CM | POA: Insufficient documentation

## 2019-11-05 DIAGNOSIS — M25561 Pain in right knee: Secondary | ICD-10-CM | POA: Insufficient documentation

## 2019-11-05 DIAGNOSIS — Z170 Estrogen receptor positive status [ER+]: Secondary | ICD-10-CM | POA: Insufficient documentation

## 2019-11-05 DIAGNOSIS — M25551 Pain in right hip: Secondary | ICD-10-CM | POA: Insufficient documentation

## 2019-11-05 DIAGNOSIS — Z9012 Acquired absence of left breast and nipple: Secondary | ICD-10-CM | POA: Insufficient documentation

## 2019-11-05 DIAGNOSIS — C773 Secondary and unspecified malignant neoplasm of axilla and upper limb lymph nodes: Secondary | ICD-10-CM | POA: Insufficient documentation

## 2019-11-06 ENCOUNTER — Ambulatory Visit: Payer: Commercial Managed Care - PPO | Attending: Nurse Practitioner

## 2019-11-06 ENCOUNTER — Encounter (HOSPITAL_BASED_OUTPATIENT_CLINIC_OR_DEPARTMENT_OTHER): Payer: Self-pay

## 2019-11-06 ENCOUNTER — Other Ambulatory Visit: Payer: Self-pay | Admitting: Nurse Practitioner

## 2019-11-06 ENCOUNTER — Ambulatory Visit (HOSPITAL_BASED_OUTPATIENT_CLINIC_OR_DEPARTMENT_OTHER): Payer: Commercial Managed Care - PPO

## 2019-11-06 DIAGNOSIS — C773 Secondary and unspecified malignant neoplasm of axilla and upper limb lymph nodes: Secondary | ICD-10-CM | POA: Insufficient documentation

## 2019-11-06 DIAGNOSIS — C50812 Malignant neoplasm of overlapping sites of left female breast: Secondary | ICD-10-CM

## 2019-11-06 DIAGNOSIS — M8589 Other specified disorders of bone density and structure, multiple sites: Secondary | ICD-10-CM | POA: Insufficient documentation

## 2019-11-06 DIAGNOSIS — R911 Solitary pulmonary nodule: Secondary | ICD-10-CM | POA: Insufficient documentation

## 2019-11-06 DIAGNOSIS — C50412 Malignant neoplasm of upper-outer quadrant of left female breast: Secondary | ICD-10-CM | POA: Insufficient documentation

## 2019-11-06 LAB — BASIC METABOLIC PANEL
Anion Gap: 6 (ref 4–12)
Calcium: 9.5 mg/dL (ref 8.9–10.2)
Carbon Dioxide, Total: 30 meq/L (ref 22–32)
Chloride: 104 meq/L (ref 98–108)
Creatinine: 0.91 mg/dL (ref 0.38–1.02)
Glucose: 89 mg/dL (ref 62–125)
Potassium: 4 meq/L (ref 3.6–5.2)
Sodium: 140 meq/L (ref 135–145)
Urea Nitrogen: 17 mg/dL (ref 8–21)
eGFR by CKD-EPI: >60 mL/min/{1.73_m2} (ref >59)

## 2019-11-06 LAB — CBC, ABS NEUTROPHIL
Hematocrit: 34 % — ABNORMAL LOW (ref 36–45)
Hemoglobin: 11.6 g/dL (ref 11.5–15.5)
Immature Granulocytes: 0.01 10*3/uL (ref 0.00–0.05)
MCH: 30 pg (ref 27.3–33.6)
MCHC: 34.5 g/dL (ref 32.2–36.5)
MCV: 87 fL (ref 81–98)
Neutrophils: 1.78 10*3/uL — ABNORMAL LOW (ref 1.80–7.00)
Platelet Count: 181 10*3/uL (ref 150–400)
RBC: 3.87 10*6/uL (ref 3.80–5.00)
RDW-CV: 13.7 % (ref 11.6–14.4)
WBC: 2.73 10*3/uL — ABNORMAL LOW (ref 4.3–10.0)

## 2019-11-06 LAB — HEPATIC FUNCTION PANEL W/ LD
ALT (GPT): 20 U/L (ref 7–33)
AST (GOT): 20 U/L (ref 9–38)
Albumin: 4.2 g/dL (ref 3.5–5.2)
Alkaline Phosphatase (Total): 40 U/L (ref 34–121)
Bilirubin (Direct): 0.1 mg/dL (ref 0.0–0.3)
Bilirubin (Total): 0.6 mg/dL (ref 0.2–1.3)
Lactate Dehydrogenase: 139 U/L (ref <210)
Protein (Total): 6.6 g/dL (ref 6.0–8.2)

## 2019-11-07 ENCOUNTER — Encounter (HOSPITAL_BASED_OUTPATIENT_CLINIC_OR_DEPARTMENT_OTHER): Payer: Self-pay | Admitting: Unknown Physician Specialty

## 2019-11-07 ENCOUNTER — Ambulatory Visit (HOSPITAL_BASED_OUTPATIENT_CLINIC_OR_DEPARTMENT_OTHER): Payer: Commercial Managed Care - PPO

## 2019-11-07 ENCOUNTER — Ambulatory Visit: Payer: Commercial Managed Care - PPO | Attending: Physician Assistant | Admitting: Medical Oncology

## 2019-11-07 DIAGNOSIS — C50812 Malignant neoplasm of overlapping sites of left female breast: Secondary | ICD-10-CM | POA: Insufficient documentation

## 2019-11-07 DIAGNOSIS — Z5112 Encounter for antineoplastic immunotherapy: Secondary | ICD-10-CM | POA: Insufficient documentation

## 2019-11-07 DIAGNOSIS — C50919 Malignant neoplasm of unspecified site of unspecified female breast: Secondary | ICD-10-CM

## 2019-11-07 DIAGNOSIS — Z9012 Acquired absence of left breast and nipple: Secondary | ICD-10-CM | POA: Insufficient documentation

## 2019-11-07 DIAGNOSIS — R5383 Other fatigue: Secondary | ICD-10-CM | POA: Insufficient documentation

## 2019-11-07 DIAGNOSIS — Z170 Estrogen receptor positive status [ER+]: Secondary | ICD-10-CM | POA: Insufficient documentation

## 2019-11-07 DIAGNOSIS — C773 Secondary and unspecified malignant neoplasm of axilla and upper limb lymph nodes: Secondary | ICD-10-CM | POA: Insufficient documentation

## 2019-11-10 ENCOUNTER — Encounter (HOSPITAL_BASED_OUTPATIENT_CLINIC_OR_DEPARTMENT_OTHER): Payer: Commercial Managed Care - PPO

## 2019-11-12 ENCOUNTER — Ambulatory Visit: Payer: Commercial Managed Care - PPO | Attending: Acupuncturist | Admitting: Acupuncturist

## 2019-11-12 DIAGNOSIS — M542 Cervicalgia: Secondary | ICD-10-CM | POA: Insufficient documentation

## 2019-11-12 DIAGNOSIS — M25551 Pain in right hip: Secondary | ICD-10-CM | POA: Insufficient documentation

## 2019-11-12 DIAGNOSIS — M2550 Pain in unspecified joint: Secondary | ICD-10-CM | POA: Insufficient documentation

## 2019-11-12 DIAGNOSIS — C773 Secondary and unspecified malignant neoplasm of axilla and upper limb lymph nodes: Secondary | ICD-10-CM | POA: Insufficient documentation

## 2019-11-12 DIAGNOSIS — M25511 Pain in right shoulder: Secondary | ICD-10-CM | POA: Insufficient documentation

## 2019-11-12 DIAGNOSIS — M25561 Pain in right knee: Secondary | ICD-10-CM | POA: Insufficient documentation

## 2019-11-12 DIAGNOSIS — R5383 Other fatigue: Secondary | ICD-10-CM | POA: Insufficient documentation

## 2019-11-12 DIAGNOSIS — R630 Anorexia: Secondary | ICD-10-CM | POA: Insufficient documentation

## 2019-11-12 DIAGNOSIS — T451X5D Adverse effect of antineoplastic and immunosuppressive drugs, subsequent encounter: Secondary | ICD-10-CM | POA: Insufficient documentation

## 2019-11-12 DIAGNOSIS — Z9012 Acquired absence of left breast and nipple: Secondary | ICD-10-CM | POA: Insufficient documentation

## 2019-11-12 DIAGNOSIS — N951 Menopausal and female climacteric states: Secondary | ICD-10-CM | POA: Insufficient documentation

## 2019-11-12 DIAGNOSIS — R197 Diarrhea, unspecified: Secondary | ICD-10-CM | POA: Insufficient documentation

## 2019-11-12 DIAGNOSIS — G620 Drug-induced polyneuropathy: Secondary | ICD-10-CM | POA: Insufficient documentation

## 2019-11-12 DIAGNOSIS — F419 Anxiety disorder, unspecified: Secondary | ICD-10-CM | POA: Insufficient documentation

## 2019-11-12 DIAGNOSIS — R61 Generalized hyperhidrosis: Secondary | ICD-10-CM | POA: Insufficient documentation

## 2019-11-12 DIAGNOSIS — M546 Pain in thoracic spine: Secondary | ICD-10-CM | POA: Insufficient documentation

## 2019-11-12 DIAGNOSIS — C50812 Malignant neoplasm of overlapping sites of left female breast: Secondary | ICD-10-CM | POA: Insufficient documentation

## 2019-11-12 DIAGNOSIS — Z170 Estrogen receptor positive status [ER+]: Secondary | ICD-10-CM | POA: Insufficient documentation

## 2019-11-12 DIAGNOSIS — M25562 Pain in left knee: Secondary | ICD-10-CM | POA: Insufficient documentation

## 2019-11-12 DIAGNOSIS — R110 Nausea: Secondary | ICD-10-CM | POA: Insufficient documentation

## 2019-11-14 ENCOUNTER — Ambulatory Visit: Payer: Commercial Managed Care - PPO

## 2019-11-18 ENCOUNTER — Other Ambulatory Visit (HOSPITAL_BASED_OUTPATIENT_CLINIC_OR_DEPARTMENT_OTHER): Payer: Self-pay | Admitting: Physician Assistant

## 2019-11-18 DIAGNOSIS — C50412 Malignant neoplasm of upper-outer quadrant of left female breast: Secondary | ICD-10-CM | POA: Insufficient documentation

## 2019-11-18 DIAGNOSIS — Z170 Estrogen receptor positive status [ER+]: Secondary | ICD-10-CM | POA: Insufficient documentation

## 2019-11-21 ENCOUNTER — Other Ambulatory Visit: Payer: Self-pay

## 2019-11-22 ENCOUNTER — Other Ambulatory Visit (HOSPITAL_BASED_OUTPATIENT_CLINIC_OR_DEPARTMENT_OTHER): Payer: Self-pay

## 2019-11-22 ENCOUNTER — Ambulatory Visit: Payer: Commercial Managed Care - PPO | Attending: Psychiatry | Admitting: Psychiatry

## 2019-11-22 DIAGNOSIS — F4322 Adjustment disorder with anxiety: Secondary | ICD-10-CM

## 2019-11-22 DIAGNOSIS — R4189 Other symptoms and signs involving cognitive functions and awareness: Secondary | ICD-10-CM | POA: Insufficient documentation

## 2019-11-22 DIAGNOSIS — R232 Flushing: Secondary | ICD-10-CM

## 2019-11-22 DIAGNOSIS — F339 Major depressive disorder, recurrent, unspecified: Secondary | ICD-10-CM

## 2019-11-22 DIAGNOSIS — N951 Menopausal and female climacteric states: Secondary | ICD-10-CM | POA: Insufficient documentation

## 2019-11-22 DIAGNOSIS — F329 Major depressive disorder, single episode, unspecified: Secondary | ICD-10-CM | POA: Insufficient documentation

## 2019-11-22 MED ORDER — VENLAFAXINE HCL ER 37.5 MG OR CP24
EXTENDED_RELEASE_CAPSULE | ORAL | 0 refills | Status: DC
Start: 2019-11-22 — End: 2019-12-25
  Filled 2019-11-22: qty 53, 30d supply, fill #0
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
## 2019-11-25 ENCOUNTER — Other Ambulatory Visit: Payer: Self-pay

## 2019-11-26 ENCOUNTER — Ambulatory Visit: Payer: Commercial Managed Care - PPO | Attending: Acupuncturist | Admitting: Acupuncturist

## 2019-11-26 ENCOUNTER — Other Ambulatory Visit (HOSPITAL_BASED_OUTPATIENT_CLINIC_OR_DEPARTMENT_OTHER): Payer: Self-pay

## 2019-11-26 DIAGNOSIS — M25511 Pain in right shoulder: Secondary | ICD-10-CM

## 2019-11-26 DIAGNOSIS — Z170 Estrogen receptor positive status [ER+]: Secondary | ICD-10-CM | POA: Insufficient documentation

## 2019-11-26 DIAGNOSIS — R5383 Other fatigue: Secondary | ICD-10-CM | POA: Insufficient documentation

## 2019-11-26 DIAGNOSIS — M25562 Pain in left knee: Secondary | ICD-10-CM | POA: Insufficient documentation

## 2019-11-26 DIAGNOSIS — M25561 Pain in right knee: Secondary | ICD-10-CM | POA: Insufficient documentation

## 2019-11-26 DIAGNOSIS — R635 Abnormal weight gain: Secondary | ICD-10-CM | POA: Insufficient documentation

## 2019-11-26 DIAGNOSIS — C50812 Malignant neoplasm of overlapping sites of left female breast: Secondary | ICD-10-CM | POA: Insufficient documentation

## 2019-11-26 DIAGNOSIS — M546 Pain in thoracic spine: Secondary | ICD-10-CM | POA: Insufficient documentation

## 2019-11-26 DIAGNOSIS — F419 Anxiety disorder, unspecified: Secondary | ICD-10-CM | POA: Insufficient documentation

## 2019-11-26 DIAGNOSIS — G620 Drug-induced polyneuropathy: Secondary | ICD-10-CM | POA: Insufficient documentation

## 2019-11-26 DIAGNOSIS — C773 Secondary and unspecified malignant neoplasm of axilla and upper limb lymph nodes: Secondary | ICD-10-CM | POA: Insufficient documentation

## 2019-11-26 DIAGNOSIS — T451X5A Adverse effect of antineoplastic and immunosuppressive drugs, initial encounter: Secondary | ICD-10-CM

## 2019-11-26 DIAGNOSIS — T451X5D Adverse effect of antineoplastic and immunosuppressive drugs, subsequent encounter: Secondary | ICD-10-CM | POA: Insufficient documentation

## 2019-11-26 DIAGNOSIS — R630 Anorexia: Secondary | ICD-10-CM | POA: Insufficient documentation

## 2019-11-26 DIAGNOSIS — R05 Cough: Secondary | ICD-10-CM | POA: Insufficient documentation

## 2019-11-26 DIAGNOSIS — R067 Sneezing: Secondary | ICD-10-CM | POA: Insufficient documentation

## 2019-11-26 DIAGNOSIS — Z9012 Acquired absence of left breast and nipple: Secondary | ICD-10-CM | POA: Insufficient documentation

## 2019-11-26 DIAGNOSIS — R61 Generalized hyperhidrosis: Secondary | ICD-10-CM | POA: Insufficient documentation

## 2019-11-26 DIAGNOSIS — N951 Menopausal and female climacteric states: Secondary | ICD-10-CM | POA: Insufficient documentation

## 2019-11-26 DIAGNOSIS — M542 Cervicalgia: Secondary | ICD-10-CM | POA: Insufficient documentation

## 2019-11-26 DIAGNOSIS — M25512 Pain in left shoulder: Secondary | ICD-10-CM

## 2019-11-26 DIAGNOSIS — R110 Nausea: Secondary | ICD-10-CM | POA: Insufficient documentation

## 2019-11-26 DIAGNOSIS — M25551 Pain in right hip: Secondary | ICD-10-CM | POA: Insufficient documentation

## 2019-11-26 DIAGNOSIS — R079 Chest pain, unspecified: Secondary | ICD-10-CM | POA: Insufficient documentation

## 2019-11-26 DIAGNOSIS — R197 Diarrhea, unspecified: Secondary | ICD-10-CM | POA: Insufficient documentation
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
## 2019-11-28 ENCOUNTER — Ambulatory Visit: Payer: Commercial Managed Care - PPO

## 2019-11-28 DIAGNOSIS — C50919 Malignant neoplasm of unspecified site of unspecified female breast: Secondary | ICD-10-CM

## 2019-11-28 NOTE — Progress Notes (Signed)
[facility] SOCIAL WORK BRIEF NOTE    Reason for Referral: Patient self-referred    Encounter Type: Other: Telehealth     Present at the Visit: Patient    Assessment: Yolanda Bowers is a 49 year old female with a history of breast cancer, ER/PR/HER2+, diagnosed 11/2018, s/p neoadjuvant chemotherapy with ddAC-THP, as well as left mastectomy and SLN biopsy. She is seen at [facility] for breast cancer surveillance while on Letrozole. Patient was referred to supportive counseling in the setting of increased anxiety and depression. This visit was scheduled as follow-up, please see prior SW note for further psychosocial and intervention details.     Patient reported she started Venlafaxine ER (37.38m) yesterday and will titrate up to 776mafter one week if tolerating well. She reported her visit with psychiatry felt "revealing" as she came to the realization she has been operating with depression  her whole life. Yolanda Bowers reported that she continues to struggle with burnout from her job, burn out from [employer]and burnout [place] to report anhedonia around activities she enjoys, as well as work. She reported she took the day off work today because she feels overwhelmed and tired; commended patient for setting this boundary. She inquired about different ways to set boundaries at work, especially around her diagnosis. She finds it tiring and disruptive when co-workers ask her (during work hours) how she is doing; we discussed different ways of addressing this if she wants to communicate her needs. Writer and patient discussed control versus no control; no control/little control causing her anxiety. She is trying to focus more on the things she has control over (for example calling off of work when she is tired, communicating her needs to her work team, determine/work towards her goals and her values).     In addition to initiation of Venlafaxine, Yolanda Bowers [facility] class  re: navigating life after treatment both of which she is excited to attend. She also continues to go on daily walks with an extra long walk on Friday. She recently planted seeds for her garden and looks forward to those sprouting. Writer and patient discussed behavioral activation and the importance of engaging in these activities that bring her joy and scheduling them into her day. Discussed the engagement of her parasympathetic nervous system through use of yoga and that this practice could be really useful for her mood if she enjoys it.     Intervention: WrProbation officernd patient discussed her values and goals as they relate to taking care of herself and putting herself first. This includes values at work, in her family, and personally. Writer encouraged patient to make a list of these over the next few weeks so that Writer and patient can work together to understand if there are aspects of her life she can control to reach these values and goals. Writer offered emotional support to patient as she continues to endure quite a bit of stress in her life.     Plan: Discussed a follow up visit in a couple of weeks. Writer will place order.       Social Work Referral: Psychiatry    Social Work Distress Management Plan: Active Social Work follow-up for distress    Time Spent - Direct Care: 60 minutes    Time Spent - Indirect Care: 25 minutes

## 2019-11-29 ENCOUNTER — Other Ambulatory Visit: Payer: Self-pay

## 2019-11-29 ENCOUNTER — Encounter (INDEPENDENT_AMBULATORY_CARE_PROVIDER_SITE_OTHER): Payer: Self-pay | Admitting: Radiation Oncology

## 2019-11-29 NOTE — Addendum Note (Signed)
Addended byJudithe Modest on: 11/29/2019 04:54 PM     Modules accepted: Orders

## 2019-11-30 NOTE — Progress Notes (Signed)
Indication: Maintenance HP for ER/PR (+), HER2(+) Breast Cancer s/p ddAC f/b THP.    Protocol/Treatment Plan: Maintenance HP q3wks    Cycle Number: 13    Laboratory Monitoring:ECHO (08/14/19) EF = 59% Baseline ECHO (12/18/18 '@[facility]'$ ) EF = 62%.    Dose Reductions: none    G-CSF Plan: none needed    Other Concerns: **DISPENSE Yolanda Bowers**    Reference: [place] Pleas Koch J Med 2017. 377; 122-31    Yolanda Bowers

## 2019-12-01 ENCOUNTER — Ambulatory Visit: Payer: Commercial Managed Care - PPO | Attending: Physician Assistant

## 2019-12-01 VITALS — BP 135/87 | HR 65 | Temp 97.7°F | Resp 16 | Wt 159.7 lb

## 2019-12-01 DIAGNOSIS — Z170 Estrogen receptor positive status [ER+]: Secondary | ICD-10-CM | POA: Insufficient documentation

## 2019-12-01 DIAGNOSIS — C50412 Malignant neoplasm of upper-outer quadrant of left female breast: Secondary | ICD-10-CM | POA: Insufficient documentation

## 2019-12-01 DIAGNOSIS — Z5112 Encounter for antineoplastic immunotherapy: Secondary | ICD-10-CM | POA: Insufficient documentation

## 2019-12-01 MED ORDER — SODIUM CHLORIDE 0.9 % IV SOLN
420.0000 mg | Freq: Once | INTRAVENOUS | Status: AC
Start: 2019-12-01 — End: 2019-12-01
  Administered 2019-12-01: 420 mg via INTRAVENOUS
  Filled 2019-12-01: qty 14

## 2019-12-01 MED ORDER — TRASTUZUMAB-ANNS 420 MG IV SOLR
450.0000 mg | Freq: Once | INTRAVENOUS | Status: AC
Start: 2019-12-01 — End: 2019-12-01
  Administered 2019-12-01: 450 mg via INTRAVENOUS
  Filled 2019-12-01: qty 21.43

## 2019-12-01 MED ORDER — LIDOCAINE HCL (PF) 1 % IJ SOLN
INTRAMUSCULAR | Status: AC
Start: 2019-12-01 — End: 2019-12-01
  Administered 2019-12-01: 20 mg
  Filled 2019-12-01: qty 2

## 2019-12-01 NOTE — Progress Notes (Signed)
Oncology Infusion Nurse Note    Subjective / Visit Information:    Reason for Visit: OP Infusion (Herceptin/Perjeta)      Objective:   Vitals:    12/01/19 1150   Temp: 36.5 C   Pulse: 65   BP: 135/87   Resp: 16   SpO2: 99%   Weight:        Medication Administrations This Visit       lidocaine PF (Xylocaine-MPF) 1 % injection ADS Override Pull Admin Date  12/01/2019  11:45 Action  Given Dose  20 mg Route   Site   Administered By  Daryl Eastern      pertuzumab (Perjeta) 420 mg in sodium chloride 0.9 % 250 mL IVPB Admin Date  12/01/2019  13:00 Action  New Bag Dose  420 mg Route  Intravenous Site   Administered By  Daryl Eastern    Ordering Provider: Teena Dunk, PA-C      trastuzumab-anns (Kanjinti) 450 mg in sodium chloride 0.9 % 271.43 mL IVPB Admin Date  12/01/2019  13:43 Action  New Bag Dose  450 mg Route  Intravenous Site   Administered By  Daryl Eastern    Ordering Provider: Teena Dunk, PA-C          Assessment and Plan:   Lab Parameters Met: N/A, No lab parameters required for today's dose   Assessment Parameters Met: Yes    Hand off: No    Discharge:   Patient tolerated chemotherapy well today and without complications.  Discharge disposition:   Accompanied by: Self  Discharged to: Home  Response: No further questions    Summary: Pt denies new or worsening symptoms. Tolerated treatment well. Discharged ambulatory and in stable condition.       Additional documentation may exist in Flowsheets or Education Activity

## 2019-12-03 ENCOUNTER — Ambulatory Visit: Payer: Commercial Managed Care - PPO | Attending: Acupuncturist | Admitting: Acupuncturist

## 2019-12-03 DIAGNOSIS — Z9012 Acquired absence of left breast and nipple: Secondary | ICD-10-CM | POA: Insufficient documentation

## 2019-12-03 DIAGNOSIS — M2550 Pain in unspecified joint: Secondary | ICD-10-CM | POA: Insufficient documentation

## 2019-12-03 DIAGNOSIS — M25549 Pain in joints of unspecified hand: Secondary | ICD-10-CM | POA: Insufficient documentation

## 2019-12-03 DIAGNOSIS — R202 Paresthesia of skin: Secondary | ICD-10-CM | POA: Insufficient documentation

## 2019-12-03 DIAGNOSIS — R61 Generalized hyperhidrosis: Secondary | ICD-10-CM | POA: Insufficient documentation

## 2019-12-03 DIAGNOSIS — R200 Anesthesia of skin: Secondary | ICD-10-CM | POA: Insufficient documentation

## 2019-12-03 DIAGNOSIS — G4700 Insomnia, unspecified: Secondary | ICD-10-CM | POA: Insufficient documentation

## 2019-12-03 DIAGNOSIS — R5383 Other fatigue: Secondary | ICD-10-CM | POA: Insufficient documentation

## 2019-12-03 DIAGNOSIS — R079 Chest pain, unspecified: Secondary | ICD-10-CM | POA: Insufficient documentation

## 2019-12-03 DIAGNOSIS — C50812 Malignant neoplasm of overlapping sites of left female breast: Secondary | ICD-10-CM | POA: Insufficient documentation

## 2019-12-03 DIAGNOSIS — G620 Drug-induced polyneuropathy: Secondary | ICD-10-CM

## 2019-12-03 DIAGNOSIS — C773 Secondary and unspecified malignant neoplasm of axilla and upper limb lymph nodes: Secondary | ICD-10-CM | POA: Insufficient documentation

## 2019-12-03 DIAGNOSIS — G4720 Circadian rhythm sleep disorder, unspecified type: Secondary | ICD-10-CM

## 2019-12-03 DIAGNOSIS — N951 Menopausal and female climacteric states: Secondary | ICD-10-CM

## 2019-12-03 DIAGNOSIS — Z170 Estrogen receptor positive status [ER+]: Secondary | ICD-10-CM | POA: Insufficient documentation

## 2019-12-03 DIAGNOSIS — R630 Anorexia: Secondary | ICD-10-CM | POA: Insufficient documentation

## 2019-12-03 DIAGNOSIS — T451X5D Adverse effect of antineoplastic and immunosuppressive drugs, subsequent encounter: Secondary | ICD-10-CM | POA: Insufficient documentation

## 2019-12-03 DIAGNOSIS — R110 Nausea: Secondary | ICD-10-CM | POA: Insufficient documentation

## 2019-12-03 DIAGNOSIS — T451X5A Adverse effect of antineoplastic and immunosuppressive drugs, initial encounter: Secondary | ICD-10-CM

## 2019-12-03 DIAGNOSIS — F419 Anxiety disorder, unspecified: Secondary | ICD-10-CM | POA: Insufficient documentation

## 2019-12-03 DIAGNOSIS — R197 Diarrhea, unspecified: Secondary | ICD-10-CM | POA: Insufficient documentation
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
## 2019-12-04 ENCOUNTER — Telehealth (HOSPITAL_BASED_OUTPATIENT_CLINIC_OR_DEPARTMENT_OTHER): Payer: Commercial Managed Care - PPO | Admitting: Radiation Oncology

## 2019-12-04 ENCOUNTER — Encounter (HOSPITAL_BASED_OUTPATIENT_CLINIC_OR_DEPARTMENT_OTHER): Payer: Self-pay | Admitting: Psychiatry

## 2019-12-04 DIAGNOSIS — C50412 Malignant neoplasm of upper-outer quadrant of left female breast: Secondary | ICD-10-CM

## 2019-12-04 NOTE — Telephone Encounter (Signed)
Telephone note 12/04/2019:  ID:  49 year old woman with left breast grade 3 invasive ductal carcinoma triple positive clinical stage II CT3N3BM0 status post neoadjuvant dose dense AC THP, status post left mastectomy and sentinel node biopsy, with single focus of angiolymphatic involvement less than 1 mm, no residual cancer, 5 - sentinel lymph nodes  She completed adjuvant left chest wall and peripheral lymphatic radiotherapy 09/21/2019 and received a dose of 50 Gray.  I spoke to Yolanda Bowers by phone regarding pain she was experiencing in her upper left chest with sneezing and coughing and a new finding on a CT scan of her lung.  Planning CT scan from 07/31/2019 showed no abnormalities her medical oncologist ordered a follow-up CT scan 10/03/2019, chest CT surveillance regarding a 3 mm nodule in the right middle lobe obtained 02/21/2019 as part of staging at the time of her breast cancer diagnosis.  The most recent scan documented stability of the right middle lobe nodule.  There is a new groundglass nodule in the left upper lobe 1.9 cm likely post radiation pneumonitis.  The radiologist recommended continued chest CT surveillance in 12 months to establish 2-year stability she would be due for this CT scan February 2022 I advised the patient that the groundglass nodule consistent with post radiation pneumonitis would likely have resolved by then.  Post radiation changes such as these are not unexpected, and no intervention is warranted.       As far as her pleuritic pain from the left upper lobe I suspect this is due to chest wall inflammation.  I recommend gentle stretches such as she is doing with PT and with yoga she can also try warm moist heat or ice packs to decrease inflammation.  She confirms that her skin has healed quite well since completing radiation. She still has some slowed healing in the posterior axillary region.  Her energy has improved her outlook on life has improved the antidepressant is helping.   Overall it sounds like she is doing well.  I encouraged her to contact us if we can be of further assistance in the future time statement 15 minutes

## 2019-12-05 ENCOUNTER — Telehealth (HOSPITAL_BASED_OUTPATIENT_CLINIC_OR_DEPARTMENT_OTHER): Payer: Self-pay | Admitting: Radiation Oncology

## 2019-12-05 NOTE — Telephone Encounter (Signed)
Telephone note 12/04/2019:  ID:  49 year old woman with left breast grade 3 invasive ductal carcinoma triple positive clinical stage II CT3N3BM0 status post neoadjuvant dose dense AC THP, status post left mastectomy and sentinel node biopsy, with single focus of angiolymphatic involvement less than 1 mm, no residual cancer, 5 - sentinel lymph nodes  She completed adjuvant left chest wall and peripheral lymphatic radiotherapy 09/21/2019 and received a dose of 50 Gray.  I spoke to Ms. Yolanda Bowers by phone regarding pain she was experiencing in her upper left chest with sneezing and coughing and a new finding on a CT scan of her lung.  Planning CT scan from 07/31/2019 showed no abnormalities her medical oncologist ordered a follow-up CT scan 10/03/2019, chest CT surveillance regarding a 3 mm nodule in the right middle lobe obtained 02/21/2019 as part of staging at the time of her breast cancer diagnosis.  The most recent scan documented stability of the right middle lobe nodule.  There is a new groundglass nodule in the left upper lobe 1.9 cm likely post radiation pneumonitis.  The radiologist recommended continued chest CT surveillance in 12 months to establish 2-year stability she would be due for this CT scan February 2022 I advised the patient that the groundglass nodule consistent with post radiation pneumonitis would likely have resolved by then.  Post radiation changes such as these are not unexpected, and no intervention is warranted.       As far as her pleuritic pain from the left upper lobe I suspect this is due to chest wall inflammation.  I recommend gentle stretches such as she is doing with PT and with yoga she can also try warm moist heat or ice packs to decrease inflammation.  She confirms that her skin has healed quite well since completing radiation. She still has some slowed healing in the posterior axillary region.  Her energy has improved her outlook on life has improved the antidepressant is helping.   Overall it sounds like she is doing well.  I encouraged her to contact us if we can be of further assistance in the future time statement 15 minutes

## 2019-12-10 ENCOUNTER — Ambulatory Visit: Payer: Commercial Managed Care - PPO | Attending: Acupuncturist | Admitting: Acupuncturist

## 2019-12-10 DIAGNOSIS — R61 Generalized hyperhidrosis: Secondary | ICD-10-CM | POA: Insufficient documentation

## 2019-12-10 DIAGNOSIS — Z170 Estrogen receptor positive status [ER+]: Secondary | ICD-10-CM | POA: Insufficient documentation

## 2019-12-10 DIAGNOSIS — M25561 Pain in right knee: Secondary | ICD-10-CM

## 2019-12-10 DIAGNOSIS — F419 Anxiety disorder, unspecified: Secondary | ICD-10-CM | POA: Insufficient documentation

## 2019-12-10 DIAGNOSIS — R5383 Other fatigue: Secondary | ICD-10-CM | POA: Insufficient documentation

## 2019-12-10 DIAGNOSIS — M25562 Pain in left knee: Secondary | ICD-10-CM

## 2019-12-10 DIAGNOSIS — N951 Menopausal and female climacteric states: Secondary | ICD-10-CM

## 2019-12-10 DIAGNOSIS — T451X5D Adverse effect of antineoplastic and immunosuppressive drugs, subsequent encounter: Secondary | ICD-10-CM | POA: Insufficient documentation

## 2019-12-10 DIAGNOSIS — R630 Anorexia: Secondary | ICD-10-CM | POA: Insufficient documentation

## 2019-12-10 DIAGNOSIS — C50812 Malignant neoplasm of overlapping sites of left female breast: Secondary | ICD-10-CM | POA: Insufficient documentation

## 2019-12-10 DIAGNOSIS — G620 Drug-induced polyneuropathy: Secondary | ICD-10-CM

## 2019-12-10 DIAGNOSIS — Z9012 Acquired absence of left breast and nipple: Secondary | ICD-10-CM | POA: Insufficient documentation

## 2019-12-10 DIAGNOSIS — R197 Diarrhea, unspecified: Secondary | ICD-10-CM | POA: Insufficient documentation

## 2019-12-10 DIAGNOSIS — M2550 Pain in unspecified joint: Secondary | ICD-10-CM | POA: Insufficient documentation

## 2019-12-10 DIAGNOSIS — C773 Secondary and unspecified malignant neoplasm of axilla and upper limb lymph nodes: Secondary | ICD-10-CM | POA: Insufficient documentation

## 2019-12-10 DIAGNOSIS — T451X5A Adverse effect of antineoplastic and immunosuppressive drugs, initial encounter: Secondary | ICD-10-CM

## 2019-12-10 DIAGNOSIS — R110 Nausea: Secondary | ICD-10-CM | POA: Insufficient documentation

## 2019-12-10 DIAGNOSIS — G4720 Circadian rhythm sleep disorder, unspecified type: Secondary | ICD-10-CM
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
## 2019-12-12 ENCOUNTER — Ambulatory Visit: Payer: Commercial Managed Care - PPO

## 2019-12-12 DIAGNOSIS — C50919 Malignant neoplasm of unspecified site of unspecified female breast: Secondary | ICD-10-CM

## 2019-12-14 NOTE — Progress Notes (Signed)
[facility] SOCIAL WORK BRIEF NOTE    Reason for Referral: Patient self-referred    Encounter Type: Other: Telehealth     Present at the Visit: Patient    Assessment: Yolanda Bowers is a 49 year old female with a history of breast cancer, ER/PR/HER2+, diagnosed 11/2018, s/p neoadjuvant chemotherapy with ddAC-THP, as well as left mastectomy and SLN biopsy. She is seen at [facility] for breast cancer surveillance while on Letrozole. Patient was referred to supportive counseling in the setting of increased anxiety and depression. This visit was scheduled as follow-up, please see prior SW note for further psychosocial and intervention details.     Yolanda Bowers presented with a pleasant affect today, notably more chipper than previous visits. Yolanda Bowers reported the last few weeks have been "much better". She reported she began taking Venlafaxine ER (37.45m) and recently increased to 765mas she was tolerating the medicine well (though notes she has to make sure to eat before taking it, otherwise she feels nauseous). She reported she is sleeping a little better, she and her husband are getting along (and he has returned to work, which has also been helpful), and overall she notices a positive shift in her mood.  She reported that she had spring break last week and was able to sleep in, rest, and do things she enjoys such as gardening, spending time with friends/family, and going places. 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 She has also been engaging in Yoga classes which she has found to be helpful and continues to see her community therapist regularly.       Intervention: Provided supportive care check-in with emotional support and active listening. It is evident patient is doing much better this week than in previous weeks. She is engaging in many activities (outside of work) throughout the day and finding joy  in them. Encouraged patient to continue engaging in these activities and continue with ongoing support from her counselor. At this point, patient does not feel there is further need to follow-up with [facility] social work. Writer agreed. We developed a plan for patient to reach out should she feel she needs further support, however, we did not schedule follow-up. Patient confirmed she has social work coDentist      Plan: No further needs identified at this time. Patient will be following up with her therapist for ongoing support.     Social Work Distress Management Plan: Referred to/followed by community provider ONLY    Time Spent - Direct Care: 60 minutes    Time Spent - Indirect Care: 20 minutes

## 2019-12-17 ENCOUNTER — Ambulatory Visit: Payer: Commercial Managed Care - PPO | Attending: Acupuncturist | Admitting: Acupuncturist

## 2019-12-17 DIAGNOSIS — M25561 Pain in right knee: Secondary | ICD-10-CM | POA: Insufficient documentation

## 2019-12-17 DIAGNOSIS — T451X5D Adverse effect of antineoplastic and immunosuppressive drugs, subsequent encounter: Secondary | ICD-10-CM | POA: Insufficient documentation

## 2019-12-17 DIAGNOSIS — M546 Pain in thoracic spine: Secondary | ICD-10-CM | POA: Insufficient documentation

## 2019-12-17 DIAGNOSIS — M25562 Pain in left knee: Secondary | ICD-10-CM | POA: Insufficient documentation

## 2019-12-17 DIAGNOSIS — R635 Abnormal weight gain: Secondary | ICD-10-CM | POA: Insufficient documentation

## 2019-12-17 DIAGNOSIS — M25551 Pain in right hip: Secondary | ICD-10-CM | POA: Insufficient documentation

## 2019-12-17 DIAGNOSIS — G620 Drug-induced polyneuropathy: Secondary | ICD-10-CM | POA: Insufficient documentation

## 2019-12-17 DIAGNOSIS — M542 Cervicalgia: Secondary | ICD-10-CM | POA: Insufficient documentation

## 2019-12-17 DIAGNOSIS — G5793 Unspecified mononeuropathy of bilateral lower limbs: Secondary | ICD-10-CM | POA: Insufficient documentation

## 2019-12-17 DIAGNOSIS — C50812 Malignant neoplasm of overlapping sites of left female breast: Secondary | ICD-10-CM | POA: Insufficient documentation

## 2019-12-17 DIAGNOSIS — R110 Nausea: Secondary | ICD-10-CM | POA: Insufficient documentation

## 2019-12-17 DIAGNOSIS — N951 Menopausal and female climacteric states: Secondary | ICD-10-CM | POA: Insufficient documentation

## 2019-12-17 DIAGNOSIS — R630 Anorexia: Secondary | ICD-10-CM | POA: Insufficient documentation

## 2019-12-17 DIAGNOSIS — Z170 Estrogen receptor positive status [ER+]: Secondary | ICD-10-CM | POA: Insufficient documentation

## 2019-12-17 DIAGNOSIS — R197 Diarrhea, unspecified: Secondary | ICD-10-CM | POA: Insufficient documentation

## 2019-12-17 DIAGNOSIS — R5383 Other fatigue: Secondary | ICD-10-CM

## 2019-12-17 DIAGNOSIS — F419 Anxiety disorder, unspecified: Secondary | ICD-10-CM | POA: Insufficient documentation

## 2019-12-17 DIAGNOSIS — T451X5A Adverse effect of antineoplastic and immunosuppressive drugs, initial encounter: Secondary | ICD-10-CM

## 2019-12-17 DIAGNOSIS — C773 Secondary and unspecified malignant neoplasm of axilla and upper limb lymph nodes: Secondary | ICD-10-CM | POA: Insufficient documentation

## 2019-12-17 NOTE — Progress Notes (Signed)
[facility] Acupuncture Note - [facility]     VISIT INFORMATION  Clinic Location:  [facility]  [place] [address]  [phone]   Treatment Status:   Follow-up Visit  Referral Details:   Yolanda Sheer, PhD, Yolanda Bowers  Reason for Referral:  Pain   Care Phase:  In Treatment (mid to late treatment)  Patient Care Team:  Yolanda Plater, MD as Oncologist (Medical Oncology)     MEDICAL HISTORY   Cancer Diagnosis:  left breast invasive ductal carcinoma, ER/PR/HER2+, diagnosed 11/2018  Lymph Node Dissection:  left axillary sentinel #1-4, excisions: - 5 lymph nodes negative for carcinoma  Radiation Details:  left chest wall, the left supraclavicular fossa, the left axilla and the left internal mammary chain, 25 fractions of 2 Gy each     Transplant/BMT Information (if relevant)  Transplant Date:   Pre/Post-Transplant Day:     Oncology History   Malignant neoplasm of upper-outer quadrant of left breast in female, estrogen receptor positive ([place])   11/07/2019 -  Systemic Therapy    pertuzumab (Perjeta) 840 mg in sodium chloride 0.9 % 250 mL IVPB, Intravenous, 2 of 6 cycles  trastuzumab (Herceptin) 593 mg in sodium chloride 0.9 % 250 mL IVPB, Intravenous, 2 of 6 cycles     11/18/2019 Initial Diagnosis    Malignant neoplasm of upper-outer quadrant of left breast in female, estrogen receptor positive ([place])           Review of patient's allergies indicates:  Allergies   Allergen Reactions    Oxycodone Skin: Itching    Codeine RJ:JOACZY/SAYTKZSW    Walnut Skin: Rash    Paclitaxel Skin: Hives      Past Medical History:   Diagnosis Date    Allergic rhinitis due to allergen     Anemia     Anxiety     Breast cancer ([place])     Burn     Cancer [facility])     Headache      Past Surgical History:   Procedure Laterality Date    BREAST RECONSTRUCTION Right 06/25/2019    reduction - Dr. Lowella Dell    MASTECTOMY SIMPLE COMPLETE Left 06/25/2019    Dr Felton Clinton      Additional  Information:    ONCOLOGY HISTORY  10/2018: [name]   11/24/2018: Bilateral mammogram revealed a negative right breast. To the left breast UOQ, there was an 2m mass and a 2.4 cm mass laterally, 17 cm from the nipple. Targeted left breast ultrasound revealed at 3 oclock 8 cm from the nipple, a 21x13x242mmass and an enlarged axillary lymph node measuring 12x11x1555mThere were multiple enlarged lymph nodes  11/27/2018: US-guided biopsy of the breast mass revealed invasive ductal carcinoma, ER/PR+, HER2+, grade 3. There was angiolymphatic invasion seen on biopsy. Biopsy of axillary lymph node positive for carcinoma.   12/08/2018: Bilateral breast MRI revealed right breast at 3 oclock, 2.7cm from the nipple, a 10 x 6 x8mm21mss and at 12 oclock, 11cm from the nipple, a spiculated 16 x 4 x 13mm35ms. Ultrasound recommended.  To the left breast, mass consistent with biopsy proved carcinoma measuring 24 x 20 x 24mm.14mre was also diffuse NME involving mid-outer breast extending over 110x45x69mm. 55min the NME, there was a small spiculated mass at 12 oclock measuring 12mm. M15mple level 1,2 and 3 lymph nodes seen, in addition to at least  3 internal mammary nodes.   12/14/2018: PET CT without evidence of distant metastasis. There are 24m lung nodules bilaterally.   12/29/2018: Started on neoadjuvant chemotherapy with ddAC x4 cycles.  03/09/2019: Started on neoadjuvant chemotherapy with Taxol, Herceptin, and Perjeta.  06/25/2019: Underwent left mastectomy and SLN biopsy with Dr. DImagene Sheller Pathology revealed a single focus of angiolymphatic involvement by carcinoma (< 0.1cm). Negative surgical margins. 0 of 5 lymph nodes positive for carcinoma. ypT0 ypN0.   FINAL DIAGNOSIS: F) Left breast, mastectomy: - Single focus of angiolymphatic involvement by carcinoma (less than 0.1 cm), See Comment. - No residual invasive or in situ carcinoma. - Biopsy site changes and  treatment response. A) Lymph nodes, left axillary sentinel #1-4, excisions: - 5 lymph nodes negative for carcinoma. - Biopsy site changes in sentinel node #3. E) Right breast skin and tissue, excision: - Skin and fibroadipose tissue, negative for carcinoma.     CURRENT THERAPY  Trastuzumab 8 mg/kg x 1, followed by 6 mg/kg IV every 3 weeks  Pertuzumab 840 mg x 1, followed by 420 mg IV every 3 weeks  Initiated 03/09/2019    SPECIAL CONSIDERATIONS  Special Considerations:  Device implant   Implanted Device:   Port    EXERCISE VITAL SIGNS / ECOG PERFORMANCE STATUS  ECOG Performance Status:   (0) Fully active, able to carry on all predisease performance without restriction  EVS: Exercise Vital Signs  EVS Instant Taken  Date: 12/17/19  Exercise Level of Effort  How many days a week of Moderate to Strenuous Exercise (breathing harder than normal, like a brisk walk)?: 0  On average, each time you exercise, how many minutes do you exercise at this level?: 0  Total Minutes of Exercise per Week: (!) 0   ESAS  [place] Symptom Assessment System  Pain Score: 2  Tiredness Score: 1  Drowsiness Score: 1  Nausea Score: 1  Appetite Score: 2  Constipation Score: 4  Diarrhea Score: No diarrhea  Dyspnea Score: 1  Depression Score: Not depressed  Anxiety Score: 1  Sleep Score: 2  Wellbeing Score: 1    INTERVAL HISTORY   Visit Number:  13  Interval History:  Yolanda Bowers returns today for acupuncture treatment to address joint pain (2/10), CIPN (finger pads) Numbness mostly, some tingling, no pain), fatigue (1/10) nausea (1/10) with low appetite (2/10), hot flashes, anxiety, and "chemo brain". This week she has had constipation with painful bowel movements, improved with MiraLAX. Yolanda Bowers again reports generally feeling better, hot flashes are reduced and notes an increase in physical energy. 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 She does feel her executive function is not as good as it was prior to  treatment. She is feeling less joint pain and stiffness with switching from Tylenol to Advil.  Current treatment: Herceptin/Perjeta.    Pertuzumab (Perjeta) 840 mg in sodium chloride 0.9 % 250 mL IVPB, Intravenous, 2 of 6 cycles  Trastuzumab (Herceptin) 593 mg in sodium chloride 0.9 % 250 mL IVPB, Intravenous, 2 of 6 cycles      Chief Complaint   Patient presents with    Neuropathy     Initial Visit Chief Complaint:  Date of Initial Intake: 09/04/2019  Yolanda Bowers a 49year old female with history of cT3N3 left breast invasive ductal carcinoma, ER/PR/HER2+, diagnosed 11/2018, now s/p resection with dr BFelton ClintonypT0N0 Nov 2. She is s/p neoadjuvant chemotherapy with ddAC-THP, as well as left mastectomy and SLN biopsy,  currently undergoing adjuvant radiation to left chest wall, the left supraclavicular fossa, the left axilla and the left internal mammary chain, 25 fractions of 2 Gy each planned. She comes to acupuncture today to address:   Joint pain - bilateral knees, 3-4/10 worse with squatting, after being sedentary, with use, ache and stiff, no sharp pain. Right hip occasional soreness, Neck/upper back ache 3-4/10. Onset May/June 2020  Neuropathy - Affects her finger pads 2/10 and bottom of her feet 1/10. Numbness, some tingling, no pain, no cold sensitivity or reaction. Onset May/June 2020  Diarrhea - Perjeta, improves with imodium  Nausea  Fatigue 4/10  Poor sleep - interrupted, night sweat/hot flash at night  She reports weight gain, and is hoping to address this with diet and exercise. Walks at least one hour per day with hills.  Patients Initial acupuncture Self-Reported Intake Form has been reviewed and discussed. Please see scanned document and ESAS for more details.    Other Current Therapies:  Massage     TCM OBSERVATION AND EVALUATION  TCM Observation:  Pulse: Thin, tight  Tongue: Deferred due to COVID precautions    ACUPUNCTURE POINTS  ANTERIOR POINTS  LI 11: Bilateral  LI 4: Bilateral  ST 36:  Bilateral  SP 6: Bilateral  LR 3: Bilateral  KI 3: Bilateral  Anterior Points Additional Details: GV24 - Midline     PROVIDER NOTES    Treatment Type:    Private  Treatment Tolerance:  Treatment tolerated well: All needles removed and accounted for  Adverse Event:  None    ASSESSMENT AND PLAN  TCM Evaluation:   TCM Pattern Diagnosis:  Toxins Invading the Channels  Recommended Frequency:  Every Two Weeks  Techniques Planned:  Acupuncture  Plan:   Plan is for 8 weekly acupuncture treatments to address  CIPN, Arthralgias, Fatigue, Hot Flashes, Night Sweats  We will assess response and modify acupuncture points at each treatment.  I explained to the patient that responders to acupuncture typically begin to show improvement by treatment number 5.   Patient will monitor any changes over the course of the week and report back at next treatment.  Follow-up Treatment:   Patient's current clinical status was reviewed and discussed with the patient. Patient's questions and concerns were addressed.    ACUPUNCTURE CHARGES  Needle Retention Time Acupuncture without E-Stim:  30 minutes   Visit Details:  25 minutes of the 60 minute visit was spent in face to face contact performing acupuncture and assessing patient presentation/progress.

## 2019-12-19 ENCOUNTER — Encounter (HOSPITAL_BASED_OUTPATIENT_CLINIC_OR_DEPARTMENT_OTHER): Payer: Self-pay | Admitting: Physician Assistant

## 2019-12-19 NOTE — Progress Notes (Deleted)
[facility]  BREAST ONCOLOGY CLINIC NOTE    IDENTIFICATION   Yolanda Bowers is a 49 year old female with history of cT3N3 left breast invasive ductal carcinoma, ER/PR/HER2+, diagnosed 11/2018, s/p resection ypT0N0 06/25/2019. S/p neoadjuvant chemotherapy with ddAC-THP, L mastectomy and L SLNB. She presents today for follow up and breast cancer surveillance while on trastuzumab, pertuzumab and letrozole.    ATTENDING ONCOLOGIST  Patient Care Team:  Kittie Plater, MD as Oncologist (Medical Oncology)    CURRENT TREATMENT   Trastuzumab 8 mg/kg x 1, followed by 6 mg/kg IV every 3 weeks  Pertuzumab 840 mg x 1, followed by 420 mg IV every 3 weeks   Initiated 03/09/2019    Letrozole 2.5 mg QD    ONCOLOGY HISTORY  Oncology History Overview   10/2018: [name]   11/24/2018: Bilateral mammogram revealed a negative right breast. To the left breast UOQ, there was an 36m mass and a 2.4 cm mass laterally, 17 cm from the nipple. Targeted left breast ultrasound revealed at 3 oclock 8 cm from the nipple, a 21x13x266mmass and an enlarged axillary lymph node measuring 12x11x1546mThere were multiple enlarged lymph nodes  11/27/2018: US-guided biopsy of the breast mass revealed invasive ductal carcinoma, ER/PR+, HER2+, grade 3. There was angiolymphatic invasion seen on biopsy. Biopsy of axillary lymph node positive for carcinoma.   12/08/2018: Bilateral breast MRI revealed right breast at 3 oclock, 2.7cm from the nipple, a 10 x 6 x8mm47mss and at 12 oclock, 11cm from the nipple, a spiculated 16 x 4 x 13mm3ms. Ultrasound recommended.  To the left breast, mass consistent with biopsy proved carcinoma measuring 24 x 20 x 24mm.11mre was also diffuse NME involving mid-outer breast extending over 110x45x69mm. 38min the NME, there was a small spiculated mass at 12 oclock measuring 12mm. M56mple level 1,2 and 3 lymph nodes seen, in addition to at least 3  internal mammary nodes.   12/14/2018: PET CT without evidence of distant metastasis. There are 4mm lung74mdules bilaterally.   12/29/2018: Started on neoadjuvant chemotherapy with ddAC x4 cycles.  03/09/2019: Started on neoadjuvant chemotherapy with Taxol, Herceptin, and Perjeta.  06/25/2019: Underwent left mastectomy and SLN biopsy with Dr. David ByrImagene Shellergy revealed a single focus of angiolymphatic involvement by carcinoma (< 0.1cm). Negative surgical margins. 0 of 5 lymph nodes positive for carcinoma. ypT0 ypN0.   FINAL DIAGNOSIS: F) Left breast, mastectomy: - Single focus of angiolymphatic involvement by carcinoma (less than 0.1 cm), See Comment. - No residual invasive or in situ carcinoma. - Biopsy site changes and treatment response. A) Lymph nodes, left axillary sentinel #1-4, excisions: - 5 lymph nodes negative for carcinoma. - Biopsy site changes in sentinel node #3. E) Right breast skin and tissue, excision: - Skin and fibroadipose tissue, negative for carcinoma.   09/21/2019: Completed XRT.    Negative genetic panel testing, 11/2018.       INTERVAL HISTORY  Yolanda Bowers returns today with *** for scheduled evaluation and trastuzumab and pertuzumab. Last seen in clinic on 11/07/19. Started letrozole on ***. Mammogram done prior to visit today, told results ***.     Fatigue ***  Joint pain (primarily in shoulders and knees) ***  Sleep is poor due to hot flashes at night. Currently taking ativan about once weekly for sleep.   Neuropathy: numbness in b/l fingers and in the morning her feet feel numb.   Diarrhea:   Lump in her  R breast near the incision line.  Mood ***      Pertuzumab ROS:   Fatigue is overall stable.   Denies any hair loss, n/v/d, peripheral neuropathy or rash. Appetite is low but stable. Denies any fever, HA, weakness, dizziness, mouth sores/change in taste, SOB, abd pain, muscle/joint aches, nail changes, swelling, pruritis, insomnia.   Labs demonstrate stable myelosuppression.     Trastuzumab ROS:    Denies any HA, weakness, body pain, dizziness, insomnia, cough, SOB, rhinitis, abd pain, n/v/d/c, back pain, rash or LE edema.    Letrozole ROS:   Denies hot flashes, weight gain, night sweats, nausea, arthralgias.   Last cholesterol checked ***.     ROS  Review of Systems - Oncology    PAST MEDICAL HISTORY  Patient Active Problem List   Diagnosis    Macromastia    Malignant neoplasm of upper-outer quadrant of left breast in female, estrogen receptor positive ([place])    CIN I (cervical intraepithelial neoplasia I)     MEDICATIONS    Current Outpatient Medications:     acetaminophen 500 MG tablet, Take 500 mg by mouth 2 times a day as needed., Disp: , Rfl:     cetirizine 10 MG tablet, Take 10 mg by mouth daily., Disp: , Rfl:     famotidine 20 MG tablet, Take 1 tablet by mouth 2 times a day, Disp: 180 tablet, Rfl: 0    gabapentin 100 MG capsule, Take 1 capsule by mouth 1 time a day at bedtime for hot flashes, If no relief after 5-7 days, increase to 2 caps 1 time a day. Can repeat up to 3 caps 1 time a day., Disp: 30 capsule, Rfl: 1    ibuprofen 600 MG tablet, Take 1 tablet by mouth every 6 hours, Disp: 30 tablet, Rfl: 0    letrozole 2.5 MG tablet, Take 2.5 mg by mouth daily., Disp: , Rfl:     loperamide 2 MG capsule, Take 2 mg by mouth daily as needed., Disp: , Rfl:     Pertuzumab (PERJETA IV), Inject intravenously every 3 weeks. , Disp: , Rfl:     polyethylene glycol 3350 17 GM/SCOOP oral powder, Fill cap with powder to the 17 gram mark and dissolve in 8 ounces of water. Drink 2 times a day, Disp: 238 g, Rfl: 0    Trastuzumab (HERCEPTIN IV), Inject intravenously every 3 weeks. , Disp: , Rfl:     venlafaxine ER 37.5 MG 24 hr capsule, Take 1 capsule by mouth daily for 7 days, THEN take 2 capsules daily for 23 days., Disp: 53 capsule, Rfl: 0    ALLERGIES  Oxycodone, Codeine, Walnut, and Paclitaxel    PAST SURGICAL HISTORY  Past Surgical History:   Procedure Laterality Date    BREAST RECONSTRUCTION Right  06/25/2019    reduction - Dr. Lowella Dell    PR MASTECTOMY SIMPLE COMPLETE Left 06/25/2019    Dr Felton Clinton     SOCIAL HISTORY  Social History     Social History Narrative    Lives in [place] with her husband and two children, ages 18 and 66. She works as an Metallurgist.      FAMILY HISTORY    Family History     Problem (# of Occurrences) Relation (Name,Age of Onset)    Alcohol/Drug (1) Maternal Grandfather    Diabetes (1) Mother    Hearing Loss (2) Mother, Father    Obesity (2) Mother, Maternal Grandfather    Skin Cancer (2) Mother,  Maternal Grandfather        PHYSICAL EXAM  VS: There were no vitals taken for this visit.  Physical Exam  ECOG: {findings; ecog performance status:31780}     LABORATORY RESULTS  ***    ASSESSMENT & PLAN  1. Malignant neoplasm of upper-outer quadrant of left breast in female, estrogen receptor positive ([place]) - 49 year old female with history of cT3N3 left breast invasive ductal carcinoma, ER/PR/HER2+, diagnosed 11/2018, s/p resection ypT0N0 06/25/2019. S/p neoadjuvant chemotherapy with ddAC-THP, L mastectomy and L SLNB. She completed XRT on 09/21/2019. She presents today for follow up and breast cancer surveillance while on trastuzumab/pertuzumab for adjuvant therapy and endocrine therapy with letrozole. Labs, performance status and clinical assessment otherwise within parameters to continue with treatment today as scheduled. Mammogram today ***, next due 12/2020. Last echo 07/2019 with EF 59%, due q 3 months, ordered today ***. DEXA done 11/06/19 shows osteopenia, next due 10/2021.   - TransTHORACIC echo (TTE) complete; Future    2. Peripheral neuropathy due to chemotherapy ([facility])  - glutamate therapy  - PT  - acupuncture    3. Pulmonary nodules - Per CT 11/06/19, stable 3 mm nodule since 02/21/19, recommend repeat chest CT in 12 months to establish 2-year stability. New 1.9cm round glass nodule in the LUL, likely postradiation pneumonitis. Plan to repeat CT in 6 months (around 04/2020) due to this  being a new finding.     4. Diarrhea, unspecified type  Likely related to pertuzumab  - well controlled with immodium    5. Osteopenia, unspecified location  DEXA 3/16 w/ osteopenia  - counseled on Calcium and Vitamin D supplementation    6. Hot flashes  - Continue acupuncture    7. Depressed mood  - Followed by MSW and involved in support group    DISPOSITION  Patient will return in 6 weeks for labs and toxicity assessment. Encouraged to call in the interim with any new symptoms.      ATTESTATION  Discussed case with preceptor Michel Santee, PA-C, who agrees with the assessment and plan as outline above.

## 2019-12-20 ENCOUNTER — Ambulatory Visit
Admit: 2019-12-20 | Discharge: 2019-12-20 | Disposition: A | Discharge status: Home / Self Care | Payer: Commercial Managed Care - PPO | Attending: Diagnostic Radiology | Admitting: Diagnostic Radiology

## 2019-12-20 ENCOUNTER — Encounter (HOSPITAL_BASED_OUTPATIENT_CLINIC_OR_DEPARTMENT_OTHER): Payer: Self-pay

## 2019-12-20 ENCOUNTER — Ambulatory Visit (HOSPITAL_BASED_OUTPATIENT_CLINIC_OR_DEPARTMENT_OTHER): Payer: Commercial Managed Care - PPO

## 2019-12-20 ENCOUNTER — Ambulatory Visit (HOSPITAL_BASED_OUTPATIENT_CLINIC_OR_DEPARTMENT_OTHER): Payer: Commercial Managed Care - PPO | Admitting: Physician Assistant

## 2019-12-20 ENCOUNTER — Other Ambulatory Visit: Payer: Self-pay

## 2019-12-20 VITALS — BP 135/87 | HR 72 | Temp 98.6°F | Resp 16 | Wt 157.3 lb

## 2019-12-20 DIAGNOSIS — R197 Diarrhea, unspecified: Secondary | ICD-10-CM

## 2019-12-20 DIAGNOSIS — R4589 Other symptoms and signs involving emotional state: Secondary | ICD-10-CM

## 2019-12-20 DIAGNOSIS — Z1231 Encounter for screening mammogram for malignant neoplasm of breast: Secondary | ICD-10-CM

## 2019-12-20 DIAGNOSIS — T451X5A Adverse effect of antineoplastic and immunosuppressive drugs, initial encounter: Secondary | ICD-10-CM

## 2019-12-20 DIAGNOSIS — Z170 Estrogen receptor positive status [ER+]: Secondary | ICD-10-CM

## 2019-12-20 DIAGNOSIS — R918 Other nonspecific abnormal finding of lung field: Secondary | ICD-10-CM

## 2019-12-20 DIAGNOSIS — C50412 Malignant neoplasm of upper-outer quadrant of left female breast: Secondary | ICD-10-CM

## 2019-12-20 DIAGNOSIS — R232 Flushing: Secondary | ICD-10-CM

## 2019-12-20 DIAGNOSIS — G620 Drug-induced polyneuropathy: Secondary | ICD-10-CM

## 2019-12-20 DIAGNOSIS — M8580 Other specified disorders of bone density and structure, unspecified site: Secondary | ICD-10-CM

## 2019-12-20 LAB — HEPATIC FUNCTION PANEL
ALT (GPT): 15 U/L (ref 7–33)
AST (GOT): 19 U/L (ref 9–38)
Albumin: 4.3 g/dL (ref 3.5–5.2)
Alkaline Phosphatase (Total): 44 U/L (ref 34–121)
Bilirubin (Direct): 0.1 mg/dL (ref 0.0–0.3)
Bilirubin (Total): 0.4 mg/dL (ref 0.2–1.3)
Protein (Total): 6.7 g/dL (ref 6.0–8.2)

## 2019-12-20 LAB — CBC, ABS NEUTROPHIL
Hematocrit: 34 % — ABNORMAL LOW (ref 36–45)
Hemoglobin: 11.5 g/dL (ref 11.5–15.5)
Immature Granulocytes: 0 10*3/uL (ref 0.00–0.05)
MCH: 30 pg (ref 27.3–33.6)
MCHC: 33.9 g/dL (ref 32.2–36.5)
MCV: 89 fL (ref 81–98)
Neutrophils: 2.36 10*3/uL (ref 1.80–7.00)
Platelet Count: 165 10*3/uL (ref 150–400)
RBC: 3.83 10*6/uL (ref 3.80–5.00)
RDW-CV: 12.6 % (ref 11.6–14.4)
WBC: 3.43 10*3/uL — ABNORMAL LOW (ref 4.3–10.0)

## 2019-12-20 LAB — BASIC METABOLIC PANEL
Anion Gap: 5 (ref 4–12)
Calcium: 9.5 mg/dL (ref 8.9–10.2)
Carbon Dioxide, Total: 29 meq/L (ref 22–32)
Chloride: 104 meq/L (ref 98–108)
Creatinine: 0.74 mg/dL (ref 0.38–1.02)
Glucose: 97 mg/dL (ref 62–125)
Potassium: 4 meq/L (ref 3.6–5.2)
Sodium: 138 meq/L (ref 135–145)
Urea Nitrogen: 15 mg/dL (ref 8–21)
eGFR by CKD-EPI: >60 mL/min/{1.73_m2} (ref >59)

## 2019-12-20 LAB — LACTATE DEHYDROGENASE: Lactate Dehydrogenase: 165 U/L (ref <210)

## 2019-12-20 MED ORDER — LETROZOLE 2.5 MG OR TABS
2.5000 mg | ORAL_TABLET | Freq: Every day | ORAL | 3 refills | Status: DC
Start: 2019-12-20 — End: 2020-10-30

## 2019-12-20 MED ORDER — LIDOCAINE HCL (PF) 1 % IJ SOLN
0.5000 mL | Freq: Once | INTRAMUSCULAR | Status: AC
Start: 2019-12-20 — End: 2019-12-20
  Administered 2019-12-20: 0.5 mL via INTRADERMAL

## 2019-12-20 NOTE — Progress Notes (Signed)
[facility]  BREAST ONCOLOGY CLINIC NOTE    ATTENDING ONCOLOGIST   Dr. Exie Parody      IDENTIFICATION  Yolanda Bowers is a 49 year old female with history of cT3N3 left breast invasive ductal carcinoma, ER/PR/HER2+, s/p neoadjuvant chemotherapy with ddAC-THP, left mastectomy and SLN biopsy, and adjuvant radiation therapy. She had a complete pathologic response at the time of surgery. Presents to clinic today for labs and follow up while on trastuzumab, pertuzumab, and letrozole.     ONCOLOGIC HISTORY     Oncology History Overview Note   10/2018: [name]   11/24/2018: Bilateral mammogram revealed a negative right breast. To the left breast UOQ, there was an 43m mass and a 2.4 cm mass laterally, 17 cm from the nipple. Targeted left breast ultrasound revealed at 3 oclock 8 cm from the nipple, a 21x13x265mmass and an enlarged axillary lymph node measuring 12x11x154mThere were multiple enlarged lymph nodes  11/27/2018: US-guided biopsy of the breast mass revealed invasive ductal carcinoma, ER/PR+, HER2+, grade 3. There was angiolymphatic invasion seen on biopsy. Biopsy of axillary lymph node positive for carcinoma.   12/08/2018: Bilateral breast MRI revealed right breast at 3 oclock, 2.7cm from the nipple, a 10 x 6 x8mm64mss and at 12 oclock, 11cm from the nipple, a spiculated 16 x 4 x 13mm57ms. Ultrasound recommended.  To the left breast, mass consistent with biopsy proved carcinoma measuring 24 x 20 x 24mm.75mre was also diffuse NME involving mid-outer breast extending over 110x45x69mm. 32min the NME, there was a small spiculated mass at 12 oclock measuring 12mm. M56mple level 1,2 and 3 lymph nodes seen, in addition to at least 3 internal mammary nodes.   12/14/2018: PET CT without evidence of distant metastasis. There are 4mm lung16mdules bilaterally.   12/29/2018: Started on neoadjuvant chemotherapy with ddAC x4  cycles.  03/09/2019: Started on neoadjuvant chemotherapy with Taxol, Herceptin, and Perjeta.  06/25/2019: Underwent left mastectomy and SLN biopsy with Dr. David ByrImagene Shellergy revealed a single focus of angiolymphatic involvement by carcinoma (< 0.1cm). Negative surgical margins. 0 of 5 lymph nodes positive for carcinoma. ypT0 ypN0.   FINAL DIAGNOSIS: F) Left breast, mastectomy: - Single focus of angiolymphatic involvement by carcinoma (less than 0.1 cm), See Comment. - No residual invasive or in situ carcinoma. - Biopsy site changes and treatment response. A) Lymph nodes, left axillary sentinel #1-4, excisions: - 5 lymph nodes negative for carcinoma. - Biopsy site changes in sentinel node #3. E) Right breast skin and tissue, excision: - Skin and fibroadipose tissue, negative for carcinoma.   09/21/2019: Completed XRT.    Negative genetic panel testing, 11/2018.     Malignant neoplasm of upper-outer quadrant of left breast in female, estrogen receptor positive (HCC)   3/[place]2021 -  Systemic Therapy    pertuzumab (Perjeta) 840 mg in sodium chloride 0.9 % 250 mL IVPB, Intravenous, 3 of 6 cycles  trastuzumab (Herceptin) 593 mg in sodium chloride 0.9 % 250 mL IVPB, Intravenous, 3 of 6 cycles     11/18/2019 Initial Diagnosis    Malignant neoplasm of upper-outer quadrant of left breast in female, estrogen receptor positive (HCC)     [facility]  INTERVAL HISTORY  Nalah Louise RoEtsuko Dierolf seen by Dr. Linden onCharna Archer2021. At that time, CT chest for follow up on indeterminate nodules revealed a new groundglass nodules consistent with post-radiation changes. She discussed this  with Dr. Mina Marble who reassured her. She states that her cough that she was experiencing is better, but will still have an occasional cough. She has been taking Zyrtec for her allergies and finds this helpful. She also notes some left chest wall tightness after completion of radiation. She finds that Effexor, acupuncture, and yoga are all helpful for hot flashes  and other side effects. She has been working with social work and psych as well with great outcomes. She feels that her fatigue comes and goes in waves. Her joint aches have improved with massage, mainly to her knees. She feels that her shoulders continue to hurt, left worse than right, and her left axilla is still sore from radiation. She notes some lymphedema to her left arm that is improving. She is working with PT on this. She had an eye exam yesterday with a contact prescription change. She notes occasional headaches which is not new or different. She continues to endorse neuropathy to her hands but not her feet. She will occasionally drop items. She has tried glutamine but it upsets her stomach. She will have a different powder drink that contains glutamine a few times per week.       REVIEW OF SYSTEMS  A complete review of systems was conducted and is negative in detail, except as noted in the interval history.    PAST MEDICAL HISTORY  Patient Active Problem List   Diagnosis    Macromastia    Malignant neoplasm of upper-outer quadrant of left breast in female, estrogen receptor positive ([place])    CIN I (cervical intraepithelial neoplasia I)       PAST SURGICAL HISTORY  Past Surgical History:   Procedure Laterality Date    BREAST BIOPSY Right 11/2018    2 core bx      BREAST RECONSTRUCTION Right 06/25/2019    reduction - Dr. Lowella Dell    PR MASTECTOMY SIMPLE COMPLETE Left 06/25/2019    Had radiation        ALLERGIES  Review of patient's allergies indicates:  Allergies   Allergen Reactions    Oxycodone Skin: Itching    Codeine FW:YOVZCH/YIFOYDXA    Walnut Skin: Rash    Paclitaxel Skin: Hives        MEDICATIONS    Current Outpatient Medications:     acetaminophen 500 MG tablet, Take 500 mg by mouth 2 times a day as needed., Disp: , Rfl:     cetirizine 10 MG tablet, Take 10 mg by mouth daily., Disp: , Rfl:     famotidine 20 MG tablet, Take 1 tablet by mouth 2 times a day, Disp: 180 tablet, Rfl: 0     ibuprofen 600 MG tablet, Take 1 tablet by mouth every 6 hours, Disp: 30 tablet, Rfl: 0    letrozole 2.5 MG tablet, Take 1 tablet (2.5 mg) by mouth daily., Disp: 90 tablet, Rfl: 3    loperamide 2 MG capsule, Take 2 mg by mouth daily as needed., Disp: , Rfl:     Pertuzumab (PERJETA IV), Inject intravenously every 3 weeks. , Disp: , Rfl:     polyethylene glycol 3350 17 GM/SCOOP oral powder, Fill cap with powder to the 17 gram mark and dissolve in 8 ounces of water. Drink 2 times a day, Disp: 238 g, Rfl: 0    Trastuzumab (HERCEPTIN IV), Inject intravenously every 3 weeks. , Disp: , Rfl:     venlafaxine ER 75 MG 24 hr capsule, Take 1 capsule (75 mg) by mouth every  morning., Disp: 30 capsule, Rfl: 1     SOCIAL HISTORY:   Social History     Tobacco Use    Smoking status: Never Smoker    Smokeless tobacco: Never Used   Substance Use Topics    Alcohol use: Not Currently    Drug use: Not Currently        FAMILY HISTORY:    Cancer-related family history includes Ovarian Cancer (age of onset: 61) in her paternal aunt; Skin Cancer in her maternal grandfather and mother.     PHYSICAL EXAMINATION   VITAL SIGNS: BP 135/87    Pulse 72    Temp 37 C (Oral)    Resp 16    Wt 71.3 kg (157 lb 4.8 oz)    SpO2 97%    BMI 25.39 kg/m       GENERAL: Well appearing, well developed female in no acute distress.  HEENT: Normocephalic, atraumatic. Sclerae anicteric. Pupils are equal, round, and reactive to light. Moist mucosa membranes, pink without ulcers, lesions, or erythema.  NECK: Neck is supple without any appreciable lymphadenopathy or thyromegaly.  LUNGS: Respirations regular and unlabored. Clear to auscultation bilaterally.  CARDIAC: Regular rate and rhythm. No murmurs, gallops, or rubs.   BREASTS: S/p left mastectomy. Hyperpigmentation to left chest wall, consistent with post-radiation changes. No concerning nodules or skin changes. Right breast without palpable nodules, skin changes, or nipple changes. No axillary  lymphadenopathy appreciated bilaterally.   Bilateral breasts without skin changes or retraction. No palpable masses, tenderness, or lesions. Nipples everted without discharge. No appreciable axillary lymphadenopathy bilaterally.  ABDOMEN: Soft, non-tender, non-distended. Normoactive bowel sounds. No hepatosplenomegaly or masses appreciated.  EXTREMITIES: No edema, clubbing, or cyanosis.  SKIN: No visible rash, bruising, or petechiae.  MSK: Without muscle or joint deformities.  NEUROLOGICAL: Alert and oriented. Mental status appropriate.   NUTRITIONAL STATUS: Adequate.    LABORATORY  Results for orders placed or performed in visit on 12/20/19   CBC, Abs Neutrophil   Result Value Ref Range    WBC 3.43 (L) 4.3 - 10.0 10*3/uL    RBC 3.83 3.80 - 5.00 10*6/uL    Hemoglobin 11.5 11.5 - 15.5 g/dL    Hematocrit 34 (L) 36 - 45 %    MCV 89 81 - 98 fL    MCH 30.0 27.3 - 33.6 pg    MCHC 33.9 32.2 - 36.5 g/dL    Platelet Count 165 150 - 400 10*3/uL    RDW-CV 12.6 11.6 - 14.4 %    Absolute Neutrophil Comment Comment not required     Neutrophils 2.36 1.80 - 7.00 10*3/uL    Immature Granulocytes 0.00 0.00 - 0.05 10*3/uL    RBC Morphology See RBC data     Platelet Morphology See PLT count     WBC Morphology See Diff    Basic Metabolic Panel   Result Value Ref Range    Sodium 138 135 - 145 meq/L    Potassium 4.0 3.6 - 5.2 meq/L    Chloride 104 98 - 108 meq/L    Carbon Dioxide, Total 29 22 - 32 meq/L    Anion Gap 5 4 - 12    Glucose 97 62 - 125 mg/dL    Urea Nitrogen 15 8 - 21 mg/dL    Creatinine 0.74 0.38 - 1.02 mg/dL    Calcium 9.5 8.9 - 10.2 mg/dL    eGFR, Calculated >60 >59 mL/min/[1.73_m2]    GFR, Information       Calculated GFR  by CKD-EPI equation. Inaccurate with changing renal function. See http://depts.[email or web address].html.   Hepatic Function Panel   Result Value Ref Range    Albumin 4.3 3.5 - 5.2 g/dL    Protein (Total) 6.7 6.0 - 8.2 g/dL    Bilirubin (Total) 0.4 0.2 - 1.3 mg/dL    Bilirubin (Direct)  0.1 0.0 - 0.3 mg/dL    Alkaline Phosphatase (Total) 44 34 - 121 U/L    AST (GOT) 19 9 - 38 U/L    ALT (GPT) 15 7 - 33 U/L   Lactate Dehydrogenase   Result Value Ref Range    Lactate Dehydrogenase 165 <210 U/L       ASSESSMENT & PLAN  Tiahna Tajanay Hurley is a 49 year old female with history of cT3N3 left breast invasive ductal carcinoma, ER/PR/HER2+, s/p neoadjuvant chemotherapy with ddAC-THP, left mastectomy and SLN biopsy, and adjuvant radiation therapy. She had a complete pathologic response at the time of surgery. Presents to clinic today for labs and follow up while on trastuzumab, pertuzumab, and letrozole.     1. Malignant neoplasm of upper-outer quadrant of left breast in female, estrogen receptor positive ([place])  Karagan reports she is tolerating well with notable but currently manageable side effects. Labs, ECOG performance status, and patient assessment are all within treatment parameters, so we will proceed with Herceptin/Perjeta 5/1 as planned. Tentative end date of Herceptin/Perjeta is 6/30. Continue letrozole daily.   - Right Mammogram today normal per patient report, next due 12/2020.   - Last echo 07/2019 with EF 59%. We discussed getting an echo now and one more after completion of treatment.  DEXA done 11/06/19 shows osteopenia, next due 10/2021.   - TransTHORACIC echo (TTE) complete; Future    2. Peripheral neuropathy due to chemotherapy ([place])  - Continue glutamine as tolerated.  - Continue to focus on this in PT and acupuncture as well.     3. Pulmonary nodules -   Per CT 11/06/19, stable 3 mm nodule since 02/21/19, recommend repeat chest CT in 12 months to establish 2-year stability. New 1.9cm round glass nodule in the LUL, likely postradiation pneumonitis. Plan to repeat CT in 6 months (around 04/2020) due to this being a new finding.    4. Diarrhea, unspecified type  Likely related to pertuzumab  - well controlled with immodium    5. Osteopenia, unspecified location  DEXA 3/16 w/ osteopenia. Monitor q2  years while on endocrine therapy. Continue calcium and vitamin D.    6. Hot flashes  Currently tolerable with Effexor and acupuncture. Continue both and let team know if worsens.     7. Depressed mood  - Followed by MSW and involved in support group    DISPOSITION  Patient will return in 6 weeks for labs and toxicity assessment. Encouraged to call in the interim with any new symptoms.

## 2019-12-22 ENCOUNTER — Other Ambulatory Visit (HOSPITAL_BASED_OUTPATIENT_CLINIC_OR_DEPARTMENT_OTHER): Payer: Self-pay | Admitting: Pharmacy

## 2019-12-22 ENCOUNTER — Ambulatory Visit: Payer: Commercial Managed Care - PPO | Attending: Physician Assistant

## 2019-12-22 VITALS — BP 117/78 | HR 73 | Temp 98.2°F | Resp 16

## 2019-12-22 DIAGNOSIS — Z5112 Encounter for antineoplastic immunotherapy: Secondary | ICD-10-CM | POA: Insufficient documentation

## 2019-12-22 DIAGNOSIS — C50412 Malignant neoplasm of upper-outer quadrant of left female breast: Secondary | ICD-10-CM

## 2019-12-22 DIAGNOSIS — Z170 Estrogen receptor positive status [ER+]: Secondary | ICD-10-CM | POA: Insufficient documentation

## 2019-12-22 DIAGNOSIS — C773 Secondary and unspecified malignant neoplasm of axilla and upper limb lymph nodes: Secondary | ICD-10-CM | POA: Insufficient documentation

## 2019-12-22 DIAGNOSIS — C50812 Malignant neoplasm of overlapping sites of left female breast: Secondary | ICD-10-CM | POA: Insufficient documentation

## 2019-12-22 MED ORDER — TRASTUZUMAB-ANNS 420 MG IV SOLR
6.0000 mg/kg | Freq: Once | INTRAVENOUS | Status: AC
Start: 2019-12-22 — End: 2019-12-22
  Administered 2019-12-22: 445 mg via INTRAVENOUS
  Filled 2019-12-22: qty 21.19
  Filled 2019-12-22: qty 21.17

## 2019-12-22 MED ORDER — LIDOCAINE HCL (PF) 1 % IJ SOLN
INTRAMUSCULAR | Status: AC
Start: 2019-12-22 — End: 2019-12-22
  Filled 2019-12-22: qty 2

## 2019-12-22 MED ORDER — SODIUM CHLORIDE 0.9 % IV SOLN
420.0000 mg | Freq: Once | INTRAVENOUS | Status: AC
Start: 2019-12-22 — End: 2019-12-22
  Administered 2019-12-22: 420 mg via INTRAVENOUS
  Filled 2019-12-22 (×2): qty 14

## 2019-12-22 NOTE — Progress Notes (Signed)
Oncology Infusion Nurse Note    Subjective / Visit Information:    Reason for Visit: OP Infusion (Herceptin/Perjeta)      Objective:   Vitals:    12/22/19 1129   Temp: 36.8 C   Pulse: 73   BP: 117/78   Resp: 16   SpO2: 96%       We administered trastuzumab-anns (Kanjinti) 445 mg in sodium chloride 0.9 % 271.19 mL IVPB, pertuzumab (Perjeta) 420 mg in sodium chloride 0.9 % 264 mL IVPB, and lidocaine PF.    Assessment and Plan:   Lab Parameters Met: N/A, No lab parameters required   Assessment Parameters Met: Yes      Summary: Patient entered infusion in stable ambulatory condition alone.  Patient tolerated infusions w/o issue.  Patient discharged in stable ambulatory condition.      Additional documentation may exist in Flowsheets or Education Activity

## 2019-12-22 NOTE — Progress Notes (Signed)
[facility] GEN ONC Clinical Pharmacist Chemotherapy Verification    Indication: Malignant neoplasm of upper-outer quadrant of left breast in female, estrogen receptor positive ([place]), ER/PR+, HER2+ s/p ddAC f/b THP    Protocol/Treatment Plan: Maintenance HP q3wks    Cycle Number: 14 (Epic C3)    Laboratory Monitoring:ECHO (08/14/19) EF = 59% Baseline ECHO (12/18/18 _0 ) EF = 62%, next ECHO scheduled for 01/02/20     Dose Reductions: none     G-CSF Plan: none needed     Other Concerns: **DISPENSE Calla Kicks**     Reference: [place] Ple[place] Koch J Med 2017. 377; [place] Jaskiran Pata, PharmD

## 2019-12-24 ENCOUNTER — Encounter (HOSPITAL_BASED_OUTPATIENT_CLINIC_OR_DEPARTMENT_OTHER): Payer: Commercial Managed Care - PPO | Admitting: Acupuncturist

## 2019-12-25 ENCOUNTER — Ambulatory Visit: Payer: Commercial Managed Care - PPO | Attending: Psychiatry | Admitting: Psychiatry

## 2019-12-25 DIAGNOSIS — R4189 Other symptoms and signs involving cognitive functions and awareness: Secondary | ICD-10-CM | POA: Insufficient documentation

## 2019-12-25 DIAGNOSIS — R232 Flushing: Secondary | ICD-10-CM | POA: Insufficient documentation

## 2019-12-25 DIAGNOSIS — F4322 Adjustment disorder with anxiety: Secondary | ICD-10-CM | POA: Insufficient documentation

## 2019-12-25 DIAGNOSIS — F339 Major depressive disorder, recurrent, unspecified: Secondary | ICD-10-CM | POA: Insufficient documentation

## 2019-12-25 MED ORDER — VENLAFAXINE HCL ER 75 MG OR CP24
75.0000 mg | EXTENDED_RELEASE_CAPSULE | Freq: Every morning | ORAL | 1 refills | Status: DC
Start: 2019-12-25 — End: 2020-02-01

## 2019-12-25 NOTE — Progress Notes (Signed)
Distant Site Telemedicine Encounter    I conducted this encounter from [facility] via secure, live, face-to-face video conference with the patient. Yolanda Bowers was located at home in [place], [place], without others present.  Prior to the interview, the risks and benefits of telemedicine were discussed with the patient and verbal consent was obtained.      [facility] PSYCHIATRY FOLLOW-UP NOTE   ID: Yolanda Bowers is a 49 year old female with cT3N3 left breast invasive ductal carcinoma, ER/PR/HER2+ (dx 11/2018), s/p neoadjuvant THP and L mastectomy (06/2019) and adjuvant radiation, now on adjuvant trastuzumab-pertuzumab, returning for psychiatric follow-up of depression and anxiety.    Chief Complaint: Depression and Medication Questions    INTERVAL HX:   Initial consult 4/1; started venlafaxine. Chart reviewed, including acupuncture, PM&R, and social work notes. She continues on trastuzumab and pertuzumab. She has increased to venlafaxine ER 75 mg total daily but has been taking 37.5 mg PO BID due to confusion with instructions. Nausea has resolved; used compazine with nausea early on.    Mood: Improved. Feeling more in control, setting boundaries. Less irritable. Discusses realizing she's had depression "my whole life." Motivation is improved. Able to enjoy time at cabin. She is snapping less, notes improved family dynamics. Motivated to find meaningful time with kids.    Hot flashes: "So much better;" also doing acupuncture. 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  She recognizes that some ongoing depressed counts may contribute to fatigue, energy, and cognitive endurance.    Anxiety: She feels less stressed out/"cluttered." Less racing mind, more in the moment. She feels fidgety, more so since starting medication, but manageable. She is limiting caffeine to 1.5 mg. We discussed fears of recurrence. She is reading journal articles on her own, which can increase  anxiety. She agreed to discuss with her oncologist.    Sleep: Much improved; setting boundaries with work has helped.  She has occasional 4:30 AM awakenings.    She has lost 5 lb in past week, notes reduced appetite since starting venlafaxine; working to be more active and improve diet; also doing yoga. Started a survivorship class with [employer] online, which has been helpful.    Review of Systems  See interval history above.     Current Outpatient Medications   Medication Sig Dispense Refill   . acetaminophen 500 MG tablet Take 500 mg by mouth 2 times a day as needed.     . cetirizine 10 MG tablet Take 10 mg by mouth daily.     . famotidine 20 MG tablet Take 1 tablet by mouth 2 times a day 180 tablet 0   . gabapentin 100 MG capsule Take 1 capsule by mouth 1 time a day at bedtime for hot flashes, If no relief after 5-7 days, increase to 2 caps 1 time a day. Can repeat up to 3 caps 1 time a day. 30 capsule 1   . ibuprofen 600 MG tablet Take 1 tablet by mouth every 6 hours 30 tablet 0   . letrozole 2.5 MG tablet Take 1 tablet (2.5 mg) by mouth daily. 90 tablet 3   . loperamide 2 MG capsule Take 2 mg by mouth daily as needed.     . Pertuzumab (PERJETA IV) Inject intravenously every 3 weeks.      . polyethylene glycol 3350 17 GM/SCOOP oral powder Fill cap with powder to the 17 gram mark and dissolve in 8 ounces of water. Drink  2 times a day 238 g 0   . Trastuzumab (HERCEPTIN IV) Inject intravenously every 3 weeks.      Marland Kitchen venlafaxine ER 37.5 MG 24 hr capsule Take 1 capsule by mouth daily for 7 days, THEN take 2 capsules daily for 23 days. 53 capsule 0     No current facility-administered medications for this visit.        MENTAL STATUS EXAM:   Appearance: Well dressed, well groomed. Alert, awake, in no apparent distress.  Behavior: Cooperative with good eye contact  Motor: No tremors or abnormal movements noted. Exam limited due to telehealth visit.  Speech: Normal rate, rhythm, and tone with appropriate volume    Mood:  "Better, more motivated"  Affect: Mood congruent, tearful, mildly constricted  Thought process: Linear, logical, goal-oriented     Thought content: No SI/HI/AVH  Insight: Good; better at recognizing anxious thought patterns   judgment: Good  Orientation: Oriented to self, place, situation   Attention: Good  Memory: Grossly intact; not formally tested    SCREENING TOOLS:  Initial Pathway PHQ-9 Score    Flowsheets    Last refreshed: 12/25/2019  4:34 PM: Depression Care Pathway Initial PHQ-9 Not on file         Current as of: 12/25/2019  4:34 PM          Recent PHQ-9 Scores     PHQ9 Date 11/22/2019 12/25/2019    PHQ9 Total Score 6 9        GAD7 Total Scores       11/22/2019 12/25/2019          Total:  5  5            LABS/IMAGES/STUDIES: Data was reviewed and relevant information is discussed in assessment and plan.    SAFETY ASSESSMENT:  Predisposing risk factors: Oncology history, Depression, Anxiety and trauma history with chronic intermittent thoughts of death                     Dynamic risk factors: Depression, Anxiety and Insomnia                             Protective risk factors: Support from medical and mental health teams, moral prohibition to suicide, adaptive coping skills, high level of function, resilient, identity as mother, denies access to guns or weapons                          Imminence: Patient denies any current intent or plans of suicide or self-harm.                         Overall risk: Low risk                            Justification for level of care: Outpatient care given ability to maintain function and present to treatment                Interventions employed: Medication management  , Supportive therapy and Psychoeducation      ASSESSMENT:  Yolanda Bowers is a 49 year old female with cT3N3 left breast invasive ductal carcinoma, ER/PR/HER2+ (dx 11/2018), s/p neoadjuvant THP and L mastectomy (06/2019) and adjuvant radiation, now on adjuvant trastuzumab and pertuzumab, evaluated by [facility]  psychiatry for concern of depression from her  oncology team.    Her initial presentation was consistent with recurrent major depressive disorder, as well as adjustment disorder with anxiety.  She reports mild cognitive changes that may represent cancer-related cognitive dysfunction, and/or impacts of anxiety and depression.  Her initial PHQ 9 and GAD 7 scores underestimated her symptom burden at presentation, such that scores today reflect less improvement than she has experienced.  Given comorbid neuropathy and hot flashes, we started venlafaxine to target depression, with interval improvements in mood, anxiety, irritability, and hot flashes, as well as sleep.  We will watch appetite reduction, restlessness, and occasional early morning awakenings as she consolidates to once daily dosing, but I will continue the current total daily dose given improvements.  Apart from medications, she is also making positive behavioral, diet, and exercise changes, noting improvements in these domains as well.  We discussed understandable concerns as she faces uncertainty regarding cancer recurrence in the future, and I will continue following for support and symptom evaluation in this transition period.     DIAGNOSES & PLAN:   1. Episode of recurrent major depressive disorder, unspecified depression episode severity (HCC)  - venlafaxine ER 75 MG 24 hr capsule; Take 1 capsule (75 mg) by mouth every morning.  Dispense: 30 capsule; Refill: 1  - Return Visit Psychiatry; Future  - Continue behavioral activation: schedule activities for enjoyment, meaning, pleasure    2. Adjustment disorder with anxious mood  - venlafaxine ER 75 MG 24 hr capsule; Take 1 capsule (75 mg) by mouth every morning.  Dispense: 30 capsule; Refill: 1  - Return Visit Psychiatry; Future  - Continue with [facility] social work  - Continue weekly therapy through [facility] for work-related stressors  - Advised her to raise questions about medical literature with her  oncologist for guidance, rather than worrying about meanings of findings.  -Sending information about support for her children, per her request.  Also recommend following up with [facility] social work.    3. Hot flashes  - venlafaxine ER 75 MG 24 hr capsule; Take 1 capsule (75 mg) by mouth every morning.  Dispense: 30 capsule; Refill: 1  - Continue acupuncture, which she is finding helpful  - Return Visit Psychiatry; Future    4. Cognitive changes  - Continue to monitor    5. Follow-up  - June 10, telehealth, as ordered  - Explore transition to [place] or community psychiatry if longer-term follow-up is needed    I have discussed the treatment plan with the patient. The patient confirms understanding the risks and benefits of treatment, the alternatives, and has consented to the plan as outlined above.     THERAPY:   Time: 40 minutes  Techniques: Supportive Therapy and Insight-oriented Therapy  Themes:  Support and reassurance, Normalization and validation of feelings, Coping with diagnosis, Psychosocial stressors, Tolerating uncertainty, Depression, Coping skills and Behavioral strategies  Plan:  - Continue with psychotherapy and medication management.    I have discussed the treatment plan with the patient. The patient confirms understanding the risks and benefits of treatment, the alternatives, and has consented to the plan as outlined above.     Thereasa Parkin, MD

## 2019-12-25 NOTE — Patient Instructions (Addendum)
Yolanda Bowers, it was great to see you today, and I am so glad you are noticing improvements.    Here is the resource I mentioned to help support children his parents are facing serious medical illness: https://www.wondersandworries.org/    Please also ask [name] about potential counseling resources for your sons.  I think this would be a great support for them, and they are lucky to have a mom who is so thoughtful about their needs.    See you next month,  Calton Golds, MD

## 2019-12-31 ENCOUNTER — Ambulatory Visit: Payer: Commercial Managed Care - PPO | Attending: Acupuncturist | Admitting: Acupuncturist

## 2019-12-31 DIAGNOSIS — R110 Nausea: Secondary | ICD-10-CM | POA: Insufficient documentation

## 2019-12-31 DIAGNOSIS — Z9012 Acquired absence of left breast and nipple: Secondary | ICD-10-CM | POA: Insufficient documentation

## 2019-12-31 DIAGNOSIS — T451X5D Adverse effect of antineoplastic and immunosuppressive drugs, subsequent encounter: Secondary | ICD-10-CM | POA: Insufficient documentation

## 2019-12-31 DIAGNOSIS — C50812 Malignant neoplasm of overlapping sites of left female breast: Secondary | ICD-10-CM | POA: Insufficient documentation

## 2019-12-31 DIAGNOSIS — R197 Diarrhea, unspecified: Secondary | ICD-10-CM | POA: Insufficient documentation

## 2019-12-31 DIAGNOSIS — M542 Cervicalgia: Secondary | ICD-10-CM | POA: Insufficient documentation

## 2019-12-31 DIAGNOSIS — M2550 Pain in unspecified joint: Secondary | ICD-10-CM | POA: Insufficient documentation

## 2019-12-31 DIAGNOSIS — R61 Generalized hyperhidrosis: Secondary | ICD-10-CM | POA: Insufficient documentation

## 2019-12-31 DIAGNOSIS — M25562 Pain in left knee: Secondary | ICD-10-CM | POA: Insufficient documentation

## 2019-12-31 DIAGNOSIS — M25561 Pain in right knee: Secondary | ICD-10-CM | POA: Insufficient documentation

## 2019-12-31 DIAGNOSIS — M546 Pain in thoracic spine: Secondary | ICD-10-CM | POA: Insufficient documentation

## 2019-12-31 DIAGNOSIS — M25551 Pain in right hip: Secondary | ICD-10-CM | POA: Insufficient documentation

## 2019-12-31 DIAGNOSIS — G620 Drug-induced polyneuropathy: Secondary | ICD-10-CM | POA: Insufficient documentation

## 2019-12-31 DIAGNOSIS — N951 Menopausal and female climacteric states: Secondary | ICD-10-CM | POA: Insufficient documentation

## 2019-12-31 DIAGNOSIS — T451X5A Adverse effect of antineoplastic and immunosuppressive drugs, initial encounter: Secondary | ICD-10-CM

## 2019-12-31 DIAGNOSIS — R5383 Other fatigue: Secondary | ICD-10-CM | POA: Insufficient documentation

## 2019-12-31 DIAGNOSIS — Z79811 Long term (current) use of aromatase inhibitors: Secondary | ICD-10-CM | POA: Insufficient documentation

## 2019-12-31 DIAGNOSIS — C773 Secondary and unspecified malignant neoplasm of axilla and upper limb lymph nodes: Secondary | ICD-10-CM | POA: Insufficient documentation

## 2019-12-31 DIAGNOSIS — Z170 Estrogen receptor positive status [ER+]: Secondary | ICD-10-CM | POA: Insufficient documentation

## 2019-12-31 NOTE — Progress Notes (Signed)
[facility] Acupuncture Note - [facility]ative Medicine     VISIT INFORMATION  Clinic Location:  [facility]TEGRATIVE MEDICINE  [place] [address]  [phone]   Treatment Status:   Follow-up Visit  Referral Details:   [name], PhD, Tyna Jaksch  Reason for Referral:  Pain   Care Phase:  In Treatment (mid to late treatment)  Patient Care Team:  Kittie Plater, MD as Oncologist (Medical Oncology)     MEDICAL HISTORY   Cancer Diagnosis:  left breast invasive ductal carcinoma, ER/PR/HER2+, diagnosed 11/2018  Lymph Node Dissection:  left axillary sentinel #1-4, excisions: - 5 lymph nodes negative for carcinoma  Radiation Details:  left chest wall, the left supraclavicular fossa, the left axilla and the left internal mammary chain, 25 fractions of 2 Gy each     Transplant/BMT Information (if relevant)  Transplant Date:   Pre/Post-Transplant Day:     Oncology History Overview Note   10/2018: [name]   11/24/2018: Bilateral mammogram revealed a negative right breast. To the left breast UOQ, there was an 53m mass and a 2.4 cm mass laterally, 17 cm from the nipple. Targeted left breast ultrasound revealed at 3 oclock 8 cm from the nipple, a 21x13x292mmass and an enlarged axillary lymph node measuring 12x11x1528mThere were multiple enlarged lymph nodes  11/27/2018: US-guided biopsy of the breast mass revealed invasive ductal carcinoma, ER/PR+, HER2+, grade 3. There was angiolymphatic invasion seen on biopsy. Biopsy of axillary lymph node positive for carcinoma.   12/08/2018: Bilateral breast MRI revealed right breast at 3 oclock, 2.7cm from the nipple, a 10 x 6 x8mm69mss and at 12 oclock, 11cm from the nipple, a spiculated 16 x 4 x 13mm29ms. Ultrasound recommended.  To the left breast, mass consistent with biopsy proved carcinoma measuring 24 x 20 x 24mm.75mre was also diffuse NME involving mid-outer breast extending over  110x45x69mm. 65min the NME, there was a small spiculated mass at 12 oclock measuring 12mm. M31mple level 1,2 and 3 lymph nodes seen, in addition to at least 3 internal mammary nodes.   12/14/2018: PET CT without evidence of distant metastasis. There are 4mm lung43mdules bilaterally.   12/29/2018: Started on neoadjuvant chemotherapy with ddAC x4 cycles.  03/09/2019: Started on neoadjuvant chemotherapy with Taxol, Herceptin, and Perjeta.  06/25/2019: Underwent left mastectomy and SLN biopsy with Dr. David ByrImagene Shellergy revealed a single focus of angiolymphatic involvement by carcinoma (< 0.1cm). Negative surgical margins. 0 of 5 lymph nodes positive for carcinoma. ypT0 ypN0.   09/21/2019: Completed XRT.  Started on letrozole 2.5mg PO da53m.     Negative genetic panel testing, 11/2018.     Malignant neoplasm of upper-outer quadrant of left breast in female, estrogen receptor positive (HCC)   3/1Camak021 -  Systemic Therapy    pertuzumab (Perjeta) 840 mg in sodium chloride 0.9 % 250 mL IVPB, Intravenous, 3 of 6 cycles  trastuzumab (Herceptin) 593 mg in sodium chloride 0.9 % 250 mL IVPB, Intravenous, 3 of 6 cycles     11/18/2019 Initial Diagnosis    Malignant neoplasm of upper-outer quadrant of left breast in female, estrogen receptor positive (HCC)      [place]  Review of patient's allergies indicates:  Allergies   Allergen Reactions    Oxycodone Skin: Itching    Codeine GI:Nausea/GX:QJJHER/DEYCXKGY Skin: Rash    Paclitaxel Skin: Hives  Past Medical History:   Diagnosis Date    Chemotherapy adverse reaction 06/25/2019    Allergic rhinitis due to allergen     Anemia     Anxiety     Breast cancer ([place])     Burn     Cancer [facility])     Headache      Past Surgical History:   Procedure Laterality Date    BREAST BIOPSY Right 11/2018    2 core bx      BREAST RECONSTRUCTION Right 06/25/2019    reduction - Dr. Lowella Dell    PR MASTECTOMY SIMPLE COMPLETE Left 06/25/2019    Had radiation      Additional Information:     ONCOLOGY HISTORY  10/2018: [name]   11/24/2018: Bilateral mammogram revealed a negative right breast. To the left breast UOQ, there was an 65m mass and a 2.4 cm mass laterally, 17 cm from the nipple. Targeted left breast ultrasound revealed at 3 oclock 8 cm from the nipple, a 21x13x270mmass and an enlarged axillary lymph node measuring 12x11x1534mThere were multiple enlarged lymph nodes  11/27/2018: US-guided biopsy of the breast mass revealed invasive ductal carcinoma, ER/PR+, HER2+, grade 3. There was angiolymphatic invasion seen on biopsy. Biopsy of axillary lymph node positive for carcinoma.   12/08/2018: Bilateral breast MRI revealed right breast at 3 oclock, 2.7cm from the nipple, a 10 x 6 x8mm7mss and at 12 oclock, 11cm from the nipple, a spiculated 16 x 4 x 13mm97ms. Ultrasound recommended.  To the left breast, mass consistent with biopsy proved carcinoma measuring 24 x 20 x 24mm.32mre was also diffuse NME involving mid-outer breast extending over 110x45x69mm. 21min the NME, there was a small spiculated mass at 12 oclock measuring 12mm. M31mple level 1,2 and 3 lymph nodes seen, in addition to at least 3 internal mammary nodes.   12/14/2018: PET CT without evidence of distant metastasis. There are 4mm lung58mdules bilaterally.   12/29/2018: Started on neoadjuvant chemotherapy with ddAC x4 cycles.  03/09/2019: Started on neoadjuvant chemotherapy with Taxol, Herceptin, and Perjeta.  06/25/2019: Underwent left mastectomy and SLN biopsy with Dr. David ByrImagene Shellergy revealed a single focus of angiolymphatic involvement by carcinoma (< 0.1cm). Negative surgical margins. 0 of 5 lymph nodes positive for carcinoma. ypT0 ypN0.   FINAL DIAGNOSIS: F) Left breast, mastectomy: - Single focus of angiolymphatic involvement by carcinoma (less than 0.1 cm), See Comment. - No residual invasive or in situ carcinoma. - Biopsy site changes and treatment response.  A) Lymph nodes, left axillary sentinel #1-4, excisions: - 5 lymph nodes negative for carcinoma. - Biopsy site changes in sentinel node #3. E) Right breast skin and tissue, excision: - Skin and fibroadipose tissue, negative for carcinoma.     CURRENT THERAPY  Trastuzumab 8 mg/kg x 1, followed by 6 mg/kg IV every 3 weeks  Pertuzumab 840 mg x 1, followed by 420 mg IV every 3 weeks  Initiated 03/09/2019    SPECIAL CONSIDERATIONS  Special Considerations:  Device implant   Implanted Device:   Port    [place]OG PERFORMANCE STATUS  ECOG Performance Status:   (0) Fully active, able to carry on all predisease performance without restriction  EVS: Exercise Vital Signs  EVS Instant Taken  Date: 12/31/19  Time: 1651  Exercise Level of Effort  How many days a week of Moderate to Strenuous Exercise (breathing harder than normal, like a brisk walk)?:  0  On average, each time you exercise, how many minutes do you exercise at this level?: 0  Total Minutes of Exercise per Week: (!) 0   ESAS  [place] Symptom Assessment System  Pain Score: 2  Tiredness Score: 2  Drowsiness Score: 3  Nausea Score: 2  Appetite Score: 3  Constipation Score: No constipation  Diarrhea Score: 4  Dyspnea Score: 1  Depression Score: Not depressed  Anxiety Score: 2  Sleep Score: 1  Wellbeing Score: 2    INTERVAL HISTORY   Visit Number:  14  Interval History:  Yolanda Bowers returns today for acupuncture treatment to address joint pain (2/10), CIPN (finger pads) Numbness mostly, some tingling, no pain), fatigue (3/10) nausea (2/10) with low appetite (3/10), hot flashes, anxiety, and "chemo brain". This week she has had diarrhea with urgency, imodium helps. Yolanda Bowers again reports generally feeling better, she had two hot flashes today and notes an increase in physical energy. She has been more active. She is feeling less joint pain and stiffness especially in her knees, shoulders still tense and stiff.  Current treatment: Herceptin/Perjeta.    Pertuzumab  (Perjeta) 840 mg in sodium chloride 0.9 % 250 mL IVPB, Intravenous, 2 of 6 cycles  Trastuzumab (Herceptin) 593 mg in sodium chloride 0.9 % 250 mL IVPB, Intravenous, 2 of 6 cycles      Chief Complaint   Patient presents with    Pain    Menopause-related    Neuropathy    Fatigue     Initial Visit Chief Complaint:  Date of Initial Intake: 09/04/2019  Yolanda Bowers is a 49 year old female with history of cT3N3 left breast invasive ductal carcinoma, ER/PR/HER2+, diagnosed 11/2018, now s/p resection with dr Felton Clinton ypT0N0 Nov 2. She is s/p neoadjuvant chemotherapy with ddAC-THP, as well as left mastectomy and SLN biopsy, currently undergoing adjuvant radiation to left chest wall, the left supraclavicular fossa, the left axilla and the left internal mammary chain, 25 fractions of 2 Gy each planned. She comes to acupuncture today to address:   Joint pain - bilateral knees, 3-4/10 worse with squatting, after being sedentary, with use, ache and stiff, no sharp pain. Right hip occasional soreness, Neck/upper back ache 3-4/10. Onset May/June 2020  Neuropathy - Affects her finger pads 2/10 and bottom of her feet 1/10. Numbness, some tingling, no pain, no cold sensitivity or reaction. Onset May/June 2020  Diarrhea - Perjeta, improves with imodium  Nausea  Fatigue 4/10  Poor sleep - interrupted, night sweat/hot flash at night  She reports weight gain, and is hoping to address this with diet and exercise. Walks at least one hour per day with hills.  Patients Initial acupuncture Self-Reported Intake Form has been reviewed and discussed. Please see scanned document and ESAS for more details.    Other Current Therapies:  Massage     TCM OBSERVATION AND EVALUATION  TCM Observation:  Pulse: Thin, tight  Tongue: Deferred due to COVID precautions    ACUPUNCTURE POINTS  ANTERIOR POINTS  LI 11: Bilateral  LI 4: Bilateral  TE 5: Bilateral  SI 3: Bilateral  ST 36: Bilateral  SP 6: Bilateral  SP 3: Bilateral  BL 62: Bilateral  GB 21:  Bilateral  KI 3: Bilateral  AURICULAR POINTS  Shen Men: Bilateral  Omega 2: Bilateral     PROVIDER NOTES    Treatment Type:    Private  Treatment Tolerance:  Treatment tolerated well: All needles removed and accounted for  Adverse Event:  None  ASSESSMENT AND PLAN  TCM Evaluation:   TCM Pattern Diagnosis:  Toxins Invading the Channels  Recommended Frequency:  Every Two Weeks  Techniques Planned:  Acupuncture  Plan:   Plan is for 8 weekly acupuncture treatments to address  CIPN, Arthralgias, Fatigue, Hot Flashes, Night Sweats  We will assess response and modify acupuncture points at each treatment.  I explained to the patient that responders to acupuncture typically begin to show improvement by treatment number 5.   Patient will monitor any changes over the course of the week and report back at next treatment.  Follow-up Treatment:   Patient's current clinical status was reviewed and discussed with the patient. Patient's questions and concerns were addressed.    ACUPUNCTURE CHARGES  Needle Retention Time Acupuncture without E-Stim:  30 minutes   Visit Details:  25 minutes of the 60 minute visit was spent in face to face contact performing acupuncture and assessing patient presentation/progress.

## 2020-01-01 ENCOUNTER — Other Ambulatory Visit: Payer: Self-pay

## 2020-01-02 ENCOUNTER — Encounter (HOSPITAL_BASED_OUTPATIENT_CLINIC_OR_DEPARTMENT_OTHER): Payer: Commercial Managed Care - PPO

## 2020-01-02 ENCOUNTER — Ambulatory Visit: Payer: Commercial Managed Care - PPO | Attending: Cardiovascular Disease

## 2020-01-02 DIAGNOSIS — Z170 Estrogen receptor positive status [ER+]: Secondary | ICD-10-CM | POA: Insufficient documentation

## 2020-01-02 DIAGNOSIS — C50412 Malignant neoplasm of upper-outer quadrant of left female breast: Secondary | ICD-10-CM | POA: Insufficient documentation

## 2020-01-02 LAB — TRANSTHORACIC ECHO (TTE) COMPLETE
AoV max: 112 cm/s
Ascending aorta: 3.1 cm
E/E' ratio: 6.5
IVSd: 0.64 cm
LV Systolic Volume (BP): 51.6 ml
LVIDd: 5.6 cm
LVIDs: 4.3 cm
LVPWd: 0.75 cm
RV Free wall pk S': 21 mmHg
RVDd: 3.3 cm
RVDd: 3.3 cm
TR Peak Vel: 199.7 cm/s

## 2020-01-03 ENCOUNTER — Other Ambulatory Visit (HOSPITAL_BASED_OUTPATIENT_CLINIC_OR_DEPARTMENT_OTHER): Payer: Self-pay | Admitting: Physician Assistant

## 2020-01-03 DIAGNOSIS — C50412 Malignant neoplasm of upper-outer quadrant of left female breast: Secondary | ICD-10-CM

## 2020-01-03 DIAGNOSIS — Z170 Estrogen receptor positive status [ER+]: Secondary | ICD-10-CM

## 2020-01-04 ENCOUNTER — Other Ambulatory Visit (HOSPITAL_BASED_OUTPATIENT_CLINIC_OR_DEPARTMENT_OTHER): Payer: Self-pay

## 2020-01-04 DIAGNOSIS — C50919 Malignant neoplasm of unspecified site of unspecified female breast: Secondary | ICD-10-CM

## 2020-01-07 ENCOUNTER — Encounter (HOSPITAL_COMMUNITY): Payer: Self-pay

## 2020-01-08 ENCOUNTER — Encounter (HOSPITAL_BASED_OUTPATIENT_CLINIC_OR_DEPARTMENT_OTHER): Payer: Self-pay

## 2020-01-08 ENCOUNTER — Ambulatory Visit: Payer: Commercial Managed Care - PPO | Attending: Physician Assistant | Admitting: Acupuncturist

## 2020-01-08 DIAGNOSIS — R5383 Other fatigue: Secondary | ICD-10-CM | POA: Insufficient documentation

## 2020-01-08 DIAGNOSIS — Z79811 Long term (current) use of aromatase inhibitors: Secondary | ICD-10-CM | POA: Insufficient documentation

## 2020-01-08 DIAGNOSIS — M2550 Pain in unspecified joint: Secondary | ICD-10-CM | POA: Insufficient documentation

## 2020-01-08 DIAGNOSIS — G620 Drug-induced polyneuropathy: Secondary | ICD-10-CM | POA: Insufficient documentation

## 2020-01-08 DIAGNOSIS — R197 Diarrhea, unspecified: Secondary | ICD-10-CM

## 2020-01-08 DIAGNOSIS — Z170 Estrogen receptor positive status [ER+]: Secondary | ICD-10-CM | POA: Insufficient documentation

## 2020-01-08 DIAGNOSIS — C50812 Malignant neoplasm of overlapping sites of left female breast: Secondary | ICD-10-CM | POA: Insufficient documentation

## 2020-01-08 DIAGNOSIS — R232 Flushing: Secondary | ICD-10-CM

## 2020-01-08 DIAGNOSIS — Z9012 Acquired absence of left breast and nipple: Secondary | ICD-10-CM | POA: Insufficient documentation

## 2020-01-08 NOTE — Progress Notes (Signed)
[facility] Ac[facility] - [facility]ative Medicine     VISIT INFORMATION  Clinic Location:  [facility]GRATIVE MEDICINE  [place] [address]  [phone]   Treatment Status:   Follow-up Visit  Referral Details:   [name], PhD, Tyna Jaksch  Reason for Referral:  Pain   Care Phase:  In Treatment (mid to late treatment)  Patient Care Team:  Kittie Plater, MD as Oncologist (Medical Oncology)     MEDICAL HISTORY   Cancer Diagnosis:  left breast invasive ductal carcinoma, ER/PR/HER2+, diagnosed 11/2018  Lymph Node Dissection:  left axillary sentinel #1-4, excisions: - 5 lymph nodes negative for carcinoma  Radiation Details:  left chest wall, the left supraclavicular fossa, the left axilla and the left internal mammary chain, 25 fractions of 2 Gy each     Transplant/BMT Information (if relevant)  Transplant Date:   Pre/Post-Transplant Day:     Oncology History Overview Note   10/2018: [name]   11/24/2018: Bilateral mammogram revealed a negative right breast. To the left breast UOQ, there was an 53m mass and a 2.4 cm mass laterally, 17 cm from the nipple. Targeted left breast ultrasound revealed at 3 oclock 8 cm from the nipple, a 21x13x292mmass and an enlarged axillary lymph node measuring 12x11x1528mThere were multiple enlarged lymph nodes  11/27/2018: US-guided biopsy of the breast mass revealed invasive ductal carcinoma, ER/PR+, HER2+, grade 3. There was angiolymphatic invasion seen on biopsy. Biopsy of axillary lymph node positive for carcinoma.   12/08/2018: Bilateral breast MRI revealed right breast at 3 oclock, 2.7cm from the nipple, a 10 x 6 x8mm69mss and at 12 oclock, 11cm from the nipple, a spiculated 16 x 4 x 13mm29ms. Ultrasound recommended.  To the left breast, mass consistent with biopsy proved carcinoma measuring 24 x 20 x 24mm.75mre was also diffuse NME involving mid-outer breast extending over  110x45x69mm. 65min the NME, there was a small spiculated mass at 12 oclock measuring 12mm. M31mple level 1,2 and 3 lymph nodes seen, in addition to at least 3 internal mammary nodes.   12/14/2018: PET CT without evidence of distant metastasis. There are 4mm lung43mdules bilaterally.   12/29/2018: Started on neoadjuvant chemotherapy with ddAC x4 cycles.  03/09/2019: Started on neoadjuvant chemotherapy with Taxol, Herceptin, and Perjeta.  06/25/2019: Underwent left mastectomy and SLN biopsy with Dr. David ByrImagene Shellergy revealed a single focus of angiolymphatic involvement by carcinoma (< 0.1cm). Negative surgical margins. 0 of 5 lymph nodes positive for carcinoma. ypT0 ypN0.   09/21/2019: Completed XRT.  Started on letrozole 2.5mg PO da53m.     Negative genetic panel testing, 11/2018.     Malignant neoplasm of upper-outer quadrant of left breast in female, estrogen receptor positive (HCC)   3/1Camak021 -  Systemic Therapy    pertuzumab (Perjeta) 840 mg in sodium chloride 0.9 % 250 mL IVPB, Intravenous, 3 of 6 cycles  trastuzumab (Herceptin) 593 mg in sodium chloride 0.9 % 250 mL IVPB, Intravenous, 3 of 6 cycles     11/18/2019 Initial Diagnosis    Malignant neoplasm of upper-outer quadrant of left breast in female, estrogen receptor positive (HCC)      [place]  Review of patient's allergies indicates:  Allergies   Allergen Reactions    Oxycodone Skin: Itching    Codeine GI:Nausea/GX:QJJHER/DEYCXKGY Skin: Rash    Paclitaxel Skin: Hives  Past Medical History:   Diagnosis Date    Chemotherapy adverse reaction 06/25/2019    Allergic rhinitis due to allergen     Anemia     Anxiety     Breast cancer ([place])     Burn     Cancer [facility])     Headache      Past Surgical History:   Procedure Laterality Date    BREAST BIOPSY Right 11/2018    2 core bx      BREAST RECONSTRUCTION Right 06/25/2019    reduction - Dr. Lowella Dell    PR MASTECTOMY SIMPLE COMPLETE Left 06/25/2019    Had radiation      Additional Information:     ONCOLOGY HISTORY  10/2018: [name]   11/24/2018: Bilateral mammogram revealed a negative right breast. To the left breast UOQ, there was an 81m mass and a 2.4 cm mass laterally, 17 cm from the nipple. Targeted left breast ultrasound revealed at 3 oclock 8 cm from the nipple, a 21x13x240mmass and an enlarged axillary lymph node measuring 12x11x1561mThere were multiple enlarged lymph nodes  11/27/2018: US-guided biopsy of the breast mass revealed invasive ductal carcinoma, ER/PR+, HER2+, grade 3. There was angiolymphatic invasion seen on biopsy. Biopsy of axillary lymph node positive for carcinoma.   12/08/2018: Bilateral breast MRI revealed right breast at 3 oclock, 2.7cm from the nipple, a 10 x 6 x8mm71mss and at 12 oclock, 11cm from the nipple, a spiculated 16 x 4 x 13mm39ms. Ultrasound recommended.  To the left breast, mass consistent with biopsy proved carcinoma measuring 24 x 20 x 24mm.35mre was also diffuse NME involving mid-outer breast extending over 110x45x69mm. 57min the NME, there was a small spiculated mass at 12 oclock measuring 12mm. M53mple level 1,2 and 3 lymph nodes seen, in addition to at least 3 internal mammary nodes.   12/14/2018: PET CT without evidence of distant metastasis. There are 4mm lung7mdules bilaterally.   12/29/2018: Started on neoadjuvant chemotherapy with ddAC x4 cycles.  03/09/2019: Started on neoadjuvant chemotherapy with Taxol, Herceptin, and Perjeta.  06/25/2019: Underwent left mastectomy and SLN biopsy with Dr. David ByrImagene Shellergy revealed a single focus of angiolymphatic involvement by carcinoma (< 0.1cm). Negative surgical margins. 0 of 5 lymph nodes positive for carcinoma. ypT0 ypN0.   FINAL DIAGNOSIS: F) Left breast, mastectomy: - Single focus of angiolymphatic involvement by carcinoma (less than 0.1 cm), See Comment. - No residual invasive or in situ carcinoma. - Biopsy site changes and treatment response.  A) Lymph nodes, left axillary sentinel #1-4, excisions: - 5 lymph nodes negative for carcinoma. - Biopsy site changes in sentinel node #3. E) Right breast skin and tissue, excision: - Skin and fibroadipose tissue, negative for carcinoma.     CURRENT THERAPY  Trastuzumab 8 mg/kg x 1, followed by 6 mg/kg IV every 3 weeks  Pertuzumab 840 mg x 1, followed by 420 mg IV every 3 weeks  Initiated 03/09/2019    SPECIAL CONSIDERATIONS  Special Considerations:  Device implant   Implanted Device:   Port    [place] HillsECOG PERFORMANCE STATUS  ECOG Performance Status:   (0) Fully active, able to carry on all predisease performance without restriction  EVS: Exercise Vital Signs  EVS Instant Taken  Date: 01/08/20  Time: 1439  Exercise Level of Effort  How many days a week of Moderate to Strenuous Exercise (breathing harder than normal, like a brisk walk)?:  2  On average, each time you exercise, how many minutes do you exercise at this level?: 60  Total Minutes of Exercise per Week: (!) 120   ESAS  [place] Symptom Assessment System  Pain Score: 2  Tiredness Score: 2  Drowsiness Score: 2  Nausea Score: Not nauseated  Appetite Score: 1  Constipation Score: No constipation  Diarrhea Score: No diarrhea  Dyspnea Score: 4  Depression Score: Not depressed  Anxiety Score: 1  Sleep Score: 2  Wellbeing Score: 1    INTERVAL HISTORY   Visit Number:  15  Interval History:  Yolanda Bowers returns today for acupuncture treatment to address joint pain (2/10), CIPN (finger pads) Numbness mostly, some tingling, no pain), fatigue (2/10) nausea (0/10) with low appetite (1/10), hot flashes (mild), anxiety, and "chemo brain". This week she has had diarrhea . Oreatha reports 4/10 shortness of breath for the past week with exertion. She has shortened her walks and avoids the bigger hill at her home. 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 She is feeling less joint pain and  stiffness especially in her knees, shoulders still tense and stiff.  Current treatment: Herceptin/Perjeta.    Pertuzumab (Perjeta) 840 mg in sodium chloride 0.9 % 250 mL IVPB, Intravenous, 2 of 6 cycles  Trastuzumab (Herceptin) 593 mg in sodium chloride 0.9 % 250 mL IVPB, Intravenous, 2 of 6 cycles      Chief Complaint   Patient presents with    Pain     Initial Visit Chief Complaint:  Date of Initial Intake: 09/04/2019  Ms. Yolanda Bowers is a 49 year old female with history of cT3N3 left breast invasive ductal carcinoma, ER/PR/HER2+, diagnosed 11/2018, now s/p resection with dr Felton Clinton ypT0N0 Nov 2. She is s/p neoadjuvant chemotherapy with ddAC-THP, as well as left mastectomy and SLN biopsy, currently undergoing adjuvant radiation to left chest wall, the left supraclavicular fossa, the left axilla and the left internal mammary chain, 25 fractions of 2 Gy each planned. She comes to acupuncture today to address:   Joint pain - bilateral knees, 3-4/10 worse with squatting, after being sedentary, with use, ache and stiff, no sharp pain. Right hip occasional soreness, Neck/upper back ache 3-4/10. Onset May/June 2020  Neuropathy - Affects her finger pads 2/10 and bottom of her feet 1/10. Numbness, some tingling, no pain, no cold sensitivity or reaction. Onset May/June 2020  Diarrhea - Perjeta, improves with imodium  Nausea  Fatigue 4/10  Poor sleep - interrupted, night sweat/hot flash at night  She reports weight gain, and is hoping to address this with diet and exercise. Walks at least one hour per day with hills.  Patients Initial acupuncture Self-Reported Intake Form has been reviewed and discussed. Please see scanned document and ESAS for more details.    Other Current Therapies:  Massage     TCM OBSERVATION AND EVALUATION  TCM Observation:  Pulse: Thin, tight  Tongue: Deferred due to COVID precautions    ACUPUNCTURE POINTS  ANTERIOR POINTS  GV 20: Midline  LU 7: Right  PC 6: Left  TE 5: Left  SI 3: Right  ST 36:  Bilateral  SP 4: Right  BL 62: Left  GB 41: Right  KI 6: Left     PROVIDER NOTES    Treatment Type:    Private  Treatment Tolerance:  Treatment tolerated well: All needles removed and accounted for  Adverse Event:  None  ASSESSMENT AND PLAN  TCM Evaluation:   TCM Pattern Diagnosis:  Toxins Invading the Channels  Recommended Frequency:  Every Two Weeks  Techniques Planned:  Acupuncture  Plan:   Plan is for 8 weekly acupuncture treatments to address  CIPN, Arthralgias, Fatigue, Hot Flashes, Night Sweats  We will assess response and modify acupuncture points at each treatment.  I explained to the patient that responders to acupuncture typically begin to show improvement by treatment number 5.   Patient will monitor any changes over the course of the week and report back at next treatment.  Follow-up Treatment:   Patient's current clinical status was reviewed and discussed with the patient. Patient's questions and concerns were addressed.    ACUPUNCTURE CHARGES  Needle Retention Time Acupuncture without E-Stim:  30 minutes   Visit Details:  25 minutes of the 60 minute visit was spent in face to face contact performing acupuncture and assessing patient presentation/progress.

## 2020-01-09 ENCOUNTER — Ambulatory Visit: Payer: Commercial Managed Care - PPO

## 2020-01-09 ENCOUNTER — Telehealth (HOSPITAL_BASED_OUTPATIENT_CLINIC_OR_DEPARTMENT_OTHER): Payer: Self-pay

## 2020-01-09 DIAGNOSIS — C50919 Malignant neoplasm of unspecified site of unspecified female breast: Secondary | ICD-10-CM

## 2020-01-09 NOTE — Telephone Encounter (Signed)
Visit Information      Reason for Visit: Adult phone consultation - support for children of adult patients     Present at the Visit: Pt      Familiarity with Child Life Services and Understanding of Illness     Familiar with Child Life Services: No      Note  Assessment: This Certified Child Life Specialist (CCLS) received referral and spoke with Pt on the phone to provide resources and guidance on conversations to support children, ages 51yo, and 81yo. CCLS introduced self and services. Pt expressed that the 51yo is experiencing difficulty coping the most at this time, as the 51yo is off to college and away from Pt. Pt shared current situation and expressed interest for support resources. This CCLS listened and provided ideas verbally, items to consider, as well some links and resources via e-mail to facilitate coping and adjusting to caregiver illness. Pt expressed appreciation and know to reach out if further questions arise and CCLS will make appropriate referral for f/u consult.      Intervention: Psycho education related to hospitalization, illness, and treatment.     Evaluation/Plan: No further child life needs at this time. For future child life referrals contact schedulers Anderson Malta at [phone] or Anda Kraft at [phone].      Time Spent - Direct Care: 30 minutes     Total Time Spent with Indirect Care: 45 minutes

## 2020-01-10 ENCOUNTER — Other Ambulatory Visit: Payer: Self-pay

## 2020-01-11 ENCOUNTER — Other Ambulatory Visit: Payer: Self-pay

## 2020-01-11 ENCOUNTER — Other Ambulatory Visit (HOSPITAL_BASED_OUTPATIENT_CLINIC_OR_DEPARTMENT_OTHER): Payer: Self-pay

## 2020-01-11 ENCOUNTER — Ambulatory Visit: Payer: Commercial Managed Care - PPO | Attending: Cardiovascular Disease | Admitting: Cardiovascular Disease

## 2020-01-11 VITALS — BP 112/75 | HR 71 | Temp 97.7°F | Ht 66.0 in | Wt 152.8 lb

## 2020-01-11 DIAGNOSIS — N870 Mild cervical dysplasia: Secondary | ICD-10-CM

## 2020-01-11 DIAGNOSIS — I427 Cardiomyopathy due to drug and external agent: Secondary | ICD-10-CM | POA: Insufficient documentation

## 2020-01-11 DIAGNOSIS — C50412 Malignant neoplasm of upper-outer quadrant of left female breast: Secondary | ICD-10-CM | POA: Insufficient documentation

## 2020-01-11 DIAGNOSIS — I493 Ventricular premature depolarization: Secondary | ICD-10-CM | POA: Insufficient documentation

## 2020-01-11 DIAGNOSIS — Z170 Estrogen receptor positive status [ER+]: Secondary | ICD-10-CM | POA: Insufficient documentation

## 2020-01-11 DIAGNOSIS — T451X5A Adverse effect of antineoplastic and immunosuppressive drugs, initial encounter: Secondary | ICD-10-CM | POA: Insufficient documentation

## 2020-01-11 LAB — B_TYPE NATRIURETIC PEPTIDE: B_Type Natriuretic Peptide: 10 pg/mL (ref <101)

## 2020-01-11 LAB — TROPONIN_I
Troponin_I Interpretation: NORMAL
Troponin_I: <0.03 ng/mL (ref <0.04)

## 2020-01-11 MED ORDER — LISINOPRIL 2.5 MG OR TABS
2.5000 mg | ORAL_TABLET | Freq: Every day | ORAL | 3 refills | Status: DC
Start: 2020-01-11 — End: 2020-01-31
  Filled 2020-01-11: qty 30, 30d supply, fill #0

## 2020-01-11 MED ORDER — METOPROLOL SUCCINATE ER 25 MG OR TB24
12.5000 mg | EXTENDED_RELEASE_TABLET | Freq: Every day | ORAL | 3 refills | Status: DC
Start: 2020-01-11 — End: 2020-01-31
  Filled 2020-01-11: qty 15, 30d supply, fill #0

## 2020-01-11 NOTE — Progress Notes (Signed)
error 

## 2020-01-11 NOTE — Patient Instructions (Addendum)
Your echocardiogram shows a decline from your previous, likely from herceptin.    Please start lisinopril 2.5mg  daily and toprol XL 12.5mg  daily for your heart    We will repeat an echocardiogram in 4 weeks to see if there is interval change    You can proceed with the infusion tomorrow    I will contact our oncologist regarding next steps    Telemedicine in 4 weeks    If you have any questions/concerns, please contact my RN, Fenton Malling at [phone]

## 2020-01-11 NOTE — Progress Notes (Deleted)
.  .  .        xx

## 2020-01-12 ENCOUNTER — Ambulatory Visit: Payer: Commercial Managed Care - PPO | Attending: Physician Assistant

## 2020-01-12 ENCOUNTER — Other Ambulatory Visit: Payer: Self-pay

## 2020-01-12 ENCOUNTER — Other Ambulatory Visit (HOSPITAL_BASED_OUTPATIENT_CLINIC_OR_DEPARTMENT_OTHER): Payer: Self-pay | Admitting: Oncology

## 2020-01-12 VITALS — BP 106/72 | HR 73 | Temp 98.1°F | Resp 16 | Wt 153.4 lb

## 2020-01-12 DIAGNOSIS — R197 Diarrhea, unspecified: Secondary | ICD-10-CM | POA: Insufficient documentation

## 2020-01-12 DIAGNOSIS — Z170 Estrogen receptor positive status [ER+]: Secondary | ICD-10-CM | POA: Insufficient documentation

## 2020-01-12 DIAGNOSIS — C50812 Malignant neoplasm of overlapping sites of left female breast: Secondary | ICD-10-CM | POA: Insufficient documentation

## 2020-01-12 DIAGNOSIS — R519 Headache, unspecified: Secondary | ICD-10-CM | POA: Insufficient documentation

## 2020-01-12 DIAGNOSIS — C50412 Malignant neoplasm of upper-outer quadrant of left female breast: Secondary | ICD-10-CM

## 2020-01-12 DIAGNOSIS — Z5112 Encounter for antineoplastic immunotherapy: Secondary | ICD-10-CM | POA: Insufficient documentation

## 2020-01-12 MED ORDER — LIDOCAINE HCL (PF) 1 % IJ SOLN
INTRAMUSCULAR | Status: AC
Start: 2020-01-12 — End: 2020-01-12
  Administered 2020-01-12: 20 mg via INTRADERMAL
  Filled 2020-01-12: qty 2

## 2020-01-12 MED ORDER — TRASTUZUMAB-ANNS 150 MG IV SOLR
450.0000 mg | Freq: Once | INTRAVENOUS | Status: AC
Start: 2020-01-12 — End: 2020-01-12
  Administered 2020-01-12: 450 mg via INTRAVENOUS
  Filled 2020-01-12: qty 21.45
  Filled 2020-01-12: qty 21.43

## 2020-01-12 MED ORDER — SODIUM CHLORIDE 0.9 % IV SOLN
420.0000 mg | Freq: Once | INTRAVENOUS | Status: AC
Start: 2020-01-12 — End: 2020-01-12
  Administered 2020-01-12: 420 mg via INTRAVENOUS
  Filled 2020-01-12 (×2): qty 14

## 2020-01-12 NOTE — Progress Notes (Signed)
[facility] Clinical Pharmacist Chemotherapy Verification     Indication: Malignant neoplasm of upper-outer quadrant of left breast in female, estrogen receptor positive ([place]), ER/PR+, HER2+ s/p Neoadj ddAC f/b THP     Protocol/Treatment Plan: Adj Maintenance HP q3wks     Cycle Number: 15 (Epic 4)     Laboratory Monitoring:ECHO (01/02/20) EF = 51%;  Baseline ECHO (12/18/18 '@[facility]'$ ) EF = 62%,      Other Concerns:   **DISPENSE KANJINTI**  Trending EF - now reduced 11% from baseline okay to proceed for now since absolute >50%; F/b cardiology.     Reference: [place] Koch J Med 2017. [phone]    Ollie Delano Pharm.D., BCOP  Clinical Oncology Pharmacist

## 2020-01-12 NOTE — Progress Notes (Signed)
Oncology Infusion Nurse Note    Subjective / Visit Information:    Reason for Visit: OP Infusion (C4D1 Herceptin/Perjeta)      Objective:   Vitals:    01/12/20 1143   Temp: 36.7 C   Pulse: 73   BP: 106/72   Resp: 16   SpO2: 96%   Weight: 69.6 kg (153 lb 7 oz)       Medication Administrations This Visit       lidocaine PF (Xylocaine-MPF) 1 % injection ADS Override Pull Admin Date  01/12/2020  12:19 Action  Given Dose  20 mg Route  Intradermal Site  Chest Administered By  Sharlene Dory, RN    Comments: port access      pertuzumab (Perjeta) 420 mg in sodium chloride 0.9 % 264 mL IVPB Admin Date  01/12/2020  13:15 Action  New Bag Dose  420 mg Route  Intravenous Site   Administered By  Sharlene Dory, RN    Ordering Provider: Teena Dunk, PA-C      trastuzumab-anns (Kanjinti) 450 mg in sodium chloride 0.9 % 271.45 mL IVPB Admin Date  01/12/2020  13:53 Action  New Bag Dose  450 mg Route  Intravenous Site   Administered By  Sharlene Dory, RN    Ordering Provider: Teena Dunk, PA-C          Assessment and Plan:   Lab Parameters Met: N/A, No lab parameters required   Assessment Parameters Met: Yes    Hand off: No    Discharge:   Patient tolerated chemotherapy well today and without complications.  Discharge disposition:   Accompanied by: Self  Discharged to: Home  Response: Aware of clinic contact information    Summary: Arrived ambulatory to infusion. Pt had appointment with cardiology yesterday. Confirmed with pharmacist Myrtie Neither and infusion APP Viann Shove, ARNP okay to give herceptin with current cardiac function. Tolerated treatment well. Discharged ambulatory.       Additional documentation may exist in Flowsheets or Education Activity

## 2020-01-14 LAB — EKG 12 LEAD
Atrial Rate: 74 {beats}/min
P Axis: 69 degrees
P-R Interval: 134 ms
Q-T Interval: 406 ms
QRS Duration: 86 ms
QTC Calculation: 450 ms
R Axis: -53 degrees
T Axis: 75 degrees
Ventricular Rate: 74 {beats}/min

## 2020-01-19 DIAGNOSIS — T451X5A Adverse effect of antineoplastic and immunosuppressive drugs, initial encounter: Secondary | ICD-10-CM | POA: Insufficient documentation

## 2020-01-19 DIAGNOSIS — I427 Cardiomyopathy due to drug and external agent: Secondary | ICD-10-CM | POA: Insufficient documentation

## 2020-01-19 NOTE — Progress Notes (Addendum)
CARDIOLOGY CLINIC NOTE    Name:  Yolanda Bowers  Date of Birth: 1970/09/02  Medical Record Number: [id number]  Primary Care Physician:  Yolanda Journey, MD  Referring Provider:  Teena Bowers, *  Date of Service: Jan 11, 2020    ID  Yolanda Bowers is a 49 year old female with history of high-grade infiltrating ductal carcinoma of the left breast (ER+/PR+, FISH +) with angiolymphatic invasion, history of neoadjuvant chemotherapy with ddAC-THP, left mastectomy with lymph node biopsy, receiving Herceptin and Perjeta.  I am being asked to see the patient for recent evidence of LV dysfunction and whether the patient should continue with Herceptin/Perjeta treatment.    Chief Complaint/Reason for Visit  New Patient    Please refer to Dr. Alto Bowers note from 08/15/2019 and Yolanda Santee, PA-C on 12/20/2019 for detailed oncologic history and treatment plan.    [name] underwent evaluation for a left breast lump noted on self-examination.  Workup revealed infiltrating ductal carcinoma of the left breast with ER+/PR+/HER-2/neu + markers with angiolymphatic invasion and positive lymph nodes.  She underwent neoadjuvant chemotherapy with DDAC-THP, which she reports intolerance due to ongoing fatigue, nausea.  She had a rash with taxol.  She has been tolerating herceptin and perjeta with close monitoring of her echocardiogram.  Initially, her echocardiogram remained stable without any concerns from effects from DDAC as well as initial doses of herceptin/perjeta.      She was feeling better with improved activity until earlier this month when she started to notice challenges with incline.  She is taking longer naps.  She started an exercise class with the [facility], but remains fatigued.  She denies any heart failure symptoms including orthopnea;/PND, lower extremity edema.  No abdominal distention.    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  She also has  some residual neuropathy in the feet.    Her concentration has been improving, but remains challenged by multitasking.  She is working full time as an Metallurgist remotely.  effexor has improved her mood and anxiety.    She had an echocardiogram on 01/02/2020 showing worsening LV function to 51% with decline in strain to -13% from normal previously at -20%      Patient Active Problem List    Diagnosis Date Noted    Chemotherapy induced cardiomyopathy ([place]) [I42.7, T45.1X5A] 01/19/2020    Malignant neoplasm of upper-outer quadrant of left breast in female, estrogen receptor positive ([place]) [C50.412, Z17.0] 11/18/2019    Macromastia [N62] 08/31/2019    CIN I (cervical intraepithelial neoplasia I) [N87.0] 06/01/2018     Review of patient's allergies indicates:  Allergies   Allergen Reactions    Oxycodone Skin: Itching    Codeine PJ:[id number]    Walnut Skin: Rash    Paclitaxel Skin: Hives     Current Outpatient Medications   Medication Sig Dispense Refill    acetaminophen 500 MG tablet Take 500 mg by mouth 2 times a day as needed.      cetirizine 10 MG tablet Take 10 mg by mouth daily.      famotidine 20 MG tablet Take 1 tablet by mouth 2 times a day 180 tablet 0    ibuprofen 600 MG tablet Take 1 tablet by mouth every 6 hours 30 tablet 0    letrozole 2.5 MG tablet Take 1 tablet (2.5 mg) by mouth daily. 90 tablet 3    lisinopril 2.5 MG tablet Take 1 tablet (2.5  mg) by mouth daily. 30 tablet 3    loperamide 2 MG capsule Take 2 mg by mouth daily as needed.      metoprolol succinate ER 25 MG 24 hr tablet Take 1/2 tablet (12.5 mg) by mouth daily. Do not chew or crush. 30 tablet 3    Pertuzumab (PERJETA IV) Inject intravenously every 3 weeks.       polyethylene glycol 3350 17 GM/SCOOP oral powder Fill cap with powder to the 17 gram mark and dissolve in 8 ounces of water. Drink 2 times a day 238 g 0    Trastuzumab (HERCEPTIN IV) Inject intravenously every 3 weeks.       venlafaxine ER 75 MG 24 hr  capsule Take 1 capsule (75 mg) by mouth every morning. 30 capsule 1     No current facility-administered medications for this visit.        Review of Systems:  A complete review of systems was otherwise negative except as noted above.   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      Family History     Problem (# of Occurrences) Relation (Name,Age of Onset)    Alcohol/Drug (1) Maternal Grandfather    Diabetes (1) Mother    Hearing Loss (2) Mother, Father    Obesity (2) Mother, Maternal Grandfather    Ovarian Cancer (1) Paternal 61 (23)    Skin Cancer (2) Mother, Maternal Grandfather        Social History     Socioeconomic History    Marital status: Married     Spouse name: Not on file    Number of children: Not on file    Years of education: Not on file    Highest education level: Not on file   Occupational History    Not on file   Social Needs    Financial resource strain: Not on file    Food insecurity     Worry: Not on file     Inability: Not on file    Transportation needs     Medical: Not on file     Non-medical: Not on file   Tobacco Use    Smoking status: Never Smoker    Smokeless tobacco: Never Used   Substance and Sexual Activity    Alcohol use: Not Currently    Drug use: Not Currently    Sexual activity: Not on file   Lifestyle    Physical activity     Days per week: Not on file     Minutes per session: Not on file    Stress: Not on file   Relationships    Social connections     Talks on phone: Not on file     Gets together: Not on file     Attends religious service: Not on file     Active member of club or organization: Not on file     Attends meetings of clubs or organizations: Not on file     Relationship  status: Not on file    Intimate partner violence     Fear of current or ex partner: Not on file     Emotionally abused: Not on file     Physically  abused: Not on file     Forced sexual activity: Not on file   Other Topics Concern    Not on file   Social History Narrative    Lives in [place] with her husband and two children, ages 95 and 63. She works as an Metallurgist.        Physical Exam:  Wt Readings from Last 3 Encounters:   01/12/20 69.6 kg (153 lb 7 oz)   01/11/20 69.3 kg (152 lb 12.8 oz)   12/20/19 71.3 kg (157 lb 4.8 oz)     BP 112/75    Pulse 71    Temp 36.5 C (Temporal)    Ht '5\' 6"'$  (1.676 m)    Wt 69.3 kg (152 lb 12.8 oz)    SpO2 99%    BMI 24.66 kg/m     General: well developed female  in no acute distress  Eyes:  Anicteric sclera, PERRLA  Neck: No masses, normal carotid pulses without audible bruit, thyroid gland is normal in size  Chest:  Lungs are clear to auscultation without wheezes, rales, or rhonchi  Cardiovascular: regular rate and rhythm, normal S1 and S2, no S3 or S4, 1/6 holosystolic murmur, PMI felt midclavicular, JVP of 5-6 cm  Abdomen: soft non-tender, non-distended with normal bowel sounds, liver span felt at the costal margin, no splenomegaly  Extremities: warm to touch bilaterally with +2 pulses in the femoral, popliteal, tibial, dorsalis pedis bilaterally , no edema bilaterally  Musculoskeletal:  Back is symmetric, no curvature, no tenderness.  Skin:  Normal skin color, texture.  No rashes or lesions  Neurologic:  Gait is normal, sensation grossly normal  Psychiatric:  Appropriate and responds to questions    Laboratory Results:   Results for orders placed or performed in visit on 01/11/20   Troponin_I   Result Value Ref Range    Troponin_I <0.03 <0.04 ng/mL    Troponin_I Interpretation Normal    B_Type Natriuretic Peptide   Result Value Ref Range    B_Type Natriuretic Peptide 10 <101 pg/mL   EKG 12-LEAD   Result Value Ref Range    Ventricular Rate 74 BPM    Atrial Rate 74 BPM    P-R  Interval 134 ms    QRS Duration 86 ms    Q-T Interval 406 ms    QTC Calculation 450 ms    P Axis 69 degrees    R Axis -53 degrees    T Axis 75 degrees    Diagnosis       SINUS RHYTHM WITH OCCASIONAL PREMATURE VENTRICULAR COMPLEXES  LEFT AXIS DEVIATION  ABNORMAL ECG  NO PREVIOUS ECGS AVAILABLE  Confirmed by Canary Brim MD, DAVID ([id number]) on 01/14/2020 9:24:04 PM       WBC   Date Value Ref Range Status   12/20/2019 3.43 (L) 4.3 - 10.0 10*3/uL Final   11/06/2019 2.73 (L) 4.3 - 10.0 10*3/uL Final   09/24/2019 2.04 (L) 4.3 - 10.0 10*3/uL Final     No results found for this or any previous visit.  Labs discussed with patient and provided in after visit summary.     Imaging:  Echo: 03/28/2019:  left ventricular end-diastolic dimension of 0.7WK, EF of 60%, strain -20%, trabeculated LV, normal valves, mild LAE, normal diastology    echocardiogram from 08/14/2019:  left ventricular end-diastolic dimension of 0.8UP, EF of 59%, GLS -20%, no change from 03/28/2019    Echocardiogram from 01/02/2020:   left ventricular end-diastolic dimension of 1.0RP, EF of  51%, GLS -13%, normal RV function, normal valves, decreased function compared to 08/14/2019    EKG: from today (01/11/2020):  Normal sinus rhythm, heart rate of 74 bpm, normal PR interval, premature ventricular contractions, LAE      Assessment:   49 year old female with history of high-grade infiltrating ductal carcinoma of the left breast (ER+/PR+, FISH +) with angiolymphatic invasion, history of neoadjuvant chemotherapy with ddAC-THP, left mastectomy with lymph node biopsy, receiving Herceptin and Perjeta with initial echocardiogram after ddAC chemotherapy being normal with subsequent echocardiogram on Heceptin/Pejeta remaining stable until recently.  Echocardiogram from several weeks ago shows decline in EF to 51% along with strain measurements decreasing to -13% from normal.  She is also having documented PVCs and exertional fatigue.  Based on the decline in her cardiac function, she  should be on vasodilator therapy.  Furthermore, she should not undergo any additional treatment with Herceptin/Perjeta after the dose tomorrow.    We will need repeat echocardiogram to determine if she can complete that last 3-4 treatments.      Plan:  1.  Start lisinorpil 2.'5mg'$  daily and toprol XL 12.'5mg'$  daily.  Patient was instructed to monitor her blood pressure and heart rate at home.  Target blood pressure of 100/70 or less.  2.  She can proceed with herceptin/perjeta infusion that is scheduled for tomorrow, but should hold off after that treatment until we have a repeat echocardiogram  3.  Repeat echocardiogram in 4 weeks.  4.  repeat BNP and troponin in 4 weeks.    Follow-up:  Follow-up in 4 weeks by telemedicine.  Patient will call sooner if symptoms change.    Total time of >55 minutes was spent face-to-face with the patient, of which more than 50% was spent counseling and coordinating care.    Juanetta Beets, MD  Division of Cardiology  Advanced Heart Failure/Cardiac Transplantation  01/11/2020

## 2020-01-23 ENCOUNTER — Encounter (HOSPITAL_BASED_OUTPATIENT_CLINIC_OR_DEPARTMENT_OTHER): Payer: Self-pay | Admitting: Cardiovascular Disease

## 2020-01-30 ENCOUNTER — Other Ambulatory Visit: Payer: Self-pay

## 2020-01-30 ENCOUNTER — Ambulatory Visit: Payer: Commercial Managed Care - PPO | Attending: Medical Oncology

## 2020-01-30 ENCOUNTER — Ambulatory Visit (HOSPITAL_BASED_OUTPATIENT_CLINIC_OR_DEPARTMENT_OTHER): Payer: Commercial Managed Care - PPO | Admitting: Medical Oncology

## 2020-01-30 DIAGNOSIS — R5383 Other fatigue: Secondary | ICD-10-CM | POA: Insufficient documentation

## 2020-01-30 DIAGNOSIS — T451X5A Adverse effect of antineoplastic and immunosuppressive drugs, initial encounter: Secondary | ICD-10-CM | POA: Insufficient documentation

## 2020-01-30 DIAGNOSIS — R197 Diarrhea, unspecified: Secondary | ICD-10-CM | POA: Insufficient documentation

## 2020-01-30 DIAGNOSIS — Z9012 Acquired absence of left breast and nipple: Secondary | ICD-10-CM | POA: Insufficient documentation

## 2020-01-30 DIAGNOSIS — M25562 Pain in left knee: Secondary | ICD-10-CM | POA: Insufficient documentation

## 2020-01-30 DIAGNOSIS — Z8041 Family history of malignant neoplasm of ovary: Secondary | ICD-10-CM | POA: Insufficient documentation

## 2020-01-30 DIAGNOSIS — Z170 Estrogen receptor positive status [ER+]: Secondary | ICD-10-CM | POA: Insufficient documentation

## 2020-01-30 DIAGNOSIS — C50412 Malignant neoplasm of upper-outer quadrant of left female breast: Secondary | ICD-10-CM

## 2020-01-30 DIAGNOSIS — Z5112 Encounter for antineoplastic immunotherapy: Secondary | ICD-10-CM | POA: Insufficient documentation

## 2020-01-30 DIAGNOSIS — N951 Menopausal and female climacteric states: Secondary | ICD-10-CM | POA: Insufficient documentation

## 2020-01-30 DIAGNOSIS — I2699 Other pulmonary embolism without acute cor pulmonale: Secondary | ICD-10-CM | POA: Insufficient documentation

## 2020-01-30 DIAGNOSIS — G5693 Unspecified mononeuropathy of bilateral upper limbs: Secondary | ICD-10-CM | POA: Insufficient documentation

## 2020-01-30 DIAGNOSIS — C50812 Malignant neoplasm of overlapping sites of left female breast: Secondary | ICD-10-CM | POA: Insufficient documentation

## 2020-01-30 DIAGNOSIS — Z79811 Long term (current) use of aromatase inhibitors: Secondary | ICD-10-CM | POA: Insufficient documentation

## 2020-01-30 DIAGNOSIS — M8580 Other specified disorders of bone density and structure, unspecified site: Secondary | ICD-10-CM | POA: Insufficient documentation

## 2020-01-30 DIAGNOSIS — Z808 Family history of malignant neoplasm of other organs or systems: Secondary | ICD-10-CM | POA: Insufficient documentation

## 2020-01-30 DIAGNOSIS — M25561 Pain in right knee: Secondary | ICD-10-CM | POA: Insufficient documentation

## 2020-01-30 DIAGNOSIS — G620 Drug-induced polyneuropathy: Secondary | ICD-10-CM | POA: Insufficient documentation

## 2020-01-30 DIAGNOSIS — R519 Headache, unspecified: Secondary | ICD-10-CM | POA: Insufficient documentation

## 2020-01-30 LAB — CBC, ABS NEUTROPHIL
Hematocrit: 36 % (ref 36–45)
Hemoglobin: 12.3 g/dL (ref 11.5–15.5)
Immature Granulocytes: 0.01 10*3/uL (ref 0.00–0.05)
MCH: 29.6 pg (ref 27.3–33.6)
MCHC: 34.4 g/dL (ref 32.2–36.5)
MCV: 86 fL (ref 81–98)
Neutrophils: 2.81 10*3/uL (ref 1.80–7.00)
Platelet Count: 179 10*3/uL (ref 150–400)
RBC: 4.15 10*6/uL (ref 3.80–5.00)
RDW-CV: 12.2 % (ref 11.6–14.4)
WBC: 4 10*3/uL — ABNORMAL LOW (ref 4.3–10.0)

## 2020-01-30 LAB — HEPATIC FUNCTION PANEL
ALT (GPT): 18 U/L (ref 7–33)
AST (GOT): 21 U/L (ref 9–38)
Albumin: 4.3 g/dL (ref 3.5–5.2)
Alkaline Phosphatase (Total): 43 U/L (ref 34–121)
Bilirubin (Direct): 0.1 mg/dL (ref 0.0–0.3)
Bilirubin (Total): 0.5 mg/dL (ref 0.2–1.3)
Protein (Total): 6.7 g/dL (ref 6.0–8.2)

## 2020-01-30 LAB — BASIC METABOLIC PANEL
Anion Gap: 5 (ref 4–12)
Calcium: 9.8 mg/dL (ref 8.9–10.2)
Carbon Dioxide, Total: 31 meq/L (ref 22–32)
Chloride: 103 meq/L (ref 98–108)
Creatinine: 0.77 mg/dL (ref 0.38–1.02)
Glucose: 98 mg/dL (ref 62–125)
Potassium: 3.9 meq/L (ref 3.6–5.2)
Sodium: 139 meq/L (ref 135–145)
Urea Nitrogen: 15 mg/dL (ref 8–21)
eGFR by CKD-EPI: >60 mL/min/{1.73_m2} (ref >59)

## 2020-01-30 LAB — LACTATE DEHYDROGENASE: Lactate Dehydrogenase: 156 U/L (ref <210)

## 2020-01-30 LAB — LIPID PANEL
Cholesterol (LDL): 81 mg/dL (ref <130)
Cholesterol/HDL Ratio: 2.6
HDL Cholesterol: 63 mg/dL (ref >39)
Non-HDL Cholesterol: 102 mg/dL (ref 0–159)
Total Cholesterol: 165 mg/dL (ref <200)
Triglyceride: 106 mg/dL (ref <150)

## 2020-01-30 MED ORDER — LIDOCAINE HCL (PF) 1 % IJ SOLN
0.5000 mL | Freq: Once | INTRAMUSCULAR | Status: AC
Start: 2020-01-30 — End: 2020-01-30
  Administered 2020-01-30: 0.5 mL via INTRADERMAL

## 2020-01-30 NOTE — Progress Notes (Signed)
[facility]  BREAST ONCOLOGY CLINIC NOTE      IDENTIFICATION  Yolanda Bowers is a 49 year old female with history of cT3N3 left breast invasive ductal carcinoma, ER/PR/HER2+, s/p neoadjuvant chemotherapy with ddAC-THP, left mastectomy and SLN biopsy, and adjuvant radiation therapy. She had a complete pathologic response at the time of surgery. Presents to clinic today for labs and follow up while on trastuzumab, pertuzumab, and letrozole.     ONCOLOGIC HISTORY     Oncology History Overview Note   10/2018: [name]   11/24/2018: Bilateral mammogram revealed a negative right breast. To the left breast UOQ, there was an 77m mass and a 2.4 cm mass laterally, 17 cm from the nipple. Targeted left breast ultrasound revealed at 3 oclock 8 cm from the nipple, a 21x13x226mmass and an enlarged axillary lymph node measuring 12x11x157mThere were multiple enlarged lymph nodes  11/27/2018: US-guided biopsy of the breast mass revealed invasive ductal carcinoma, ER/PR+, HER2+, grade 3. There was angiolymphatic invasion seen on biopsy. Biopsy of axillary lymph node positive for carcinoma.   12/08/2018: Bilateral breast MRI revealed right breast at 3 oclock, 2.7cm from the nipple, a 10 x 6 x8mm58mss and at 12 oclock, 11cm from the nipple, a spiculated 16 x 4 x 13mm64ms. Ultrasound recommended.  To the left breast, mass consistent with biopsy proved carcinoma measuring 24 x 20 x 24mm.67mre was also diffuse NME involving mid-outer breast extending over 110x45x69mm. 68min the NME, there was a small spiculated mass at 12 oclock measuring 12mm. M16mple level 1,2 and 3 lymph nodes seen, in addition to at least 3 internal mammary nodes.   12/14/2018: PET CT without evidence of distant metastasis. There are 4mm lung15mdules bilaterally.   12/29/2018: Started on neoadjuvant chemotherapy with ddAC x4 cycles.  03/09/2019: Started on neoadjuvant chemotherapy with  Taxol, Herceptin, and Perjeta.  06/25/2019: Underwent left mastectomy and SLN biopsy with Dr. David ByrImagene Shellergy revealed a single focus of angiolymphatic involvement by carcinoma (< 0.1cm). Negative surgical margins. 0 of 5 lymph nodes positive for carcinoma. ypT0 ypN0.   09/21/2019: Completed XRT.  Started on letrozole 2.5mg PO da4m.     Negative genetic panel testing, 11/2018.     Malignant neoplasm of upper-outer quadrant of left breast in female, estrogen receptor positive (HCC)   3/1[place]021 - 01/29/2020 Systemic Therapy    pertuzumab (Perjeta) 840 mg in sodium chloride 0.9 % 250 mL IVPB, Intravenous, 4 of 6 cycles  trastuzumab (Herceptin) 593 mg in sodium chloride 0.9 % 250 mL IVPB, Intravenous, 4 of 6 cycles     11/18/2019 Initial Diagnosis    Malignant neoplasm of upper-outer quadrant of left breast in female, estrogen receptor positive (HCC)      [place] INTERVAL HISTORY  Yolanda Louise RobNathaniel Manoverall well, but still fatigued. Asks about cardiology f/u. She is walking and yoga and team survivor class.  She had her younger son at home, hsuband on expedition in [place], an[place] son remaining at college.   She finds that Effexor, acupuncture, and yoga are all helpful for hot flashes and other side effects. She has been working with social work and psych as well with great outcomes. She feels that her fatigue comes and goes in waves. Her joint aches have improved with massage, mainly to her knees. She feels that her shoulders continue to hurt, left worse than right, and her left  axilla is still sore from radiation. She notes some lymphedema to her left arm that is improving. She is working with PT on this. She thought accupuncture helped, and now needs to find provider closer to home.   She notes occasional headaches which is not new or different. She continues to endorse neuropathy to her hands but not her feet. She will occasionally drop items. She has tried glutamine but it upsets her stomach. She will  have a different powder drink that contains glutamine a few times per week.       REVIEW OF SYSTEMS  A complete review of systems was conducted and is negative in detail, except as noted in the interval history.    PAST MEDICAL HISTORY  Patient Active Problem List   Diagnosis    Macromastia    Malignant neoplasm of upper-outer quadrant of left breast in female, estrogen receptor positive ([place])    CIN I (cervical intraepithelial neoplasia I)    Chemotherapy induced cardiomyopathy ([place])       PAST SURGICAL HISTORY  Past Surgical History:   Procedure Laterality Date    BREAST BIOPSY Right 11/2018    2 core bx      BREAST RECONSTRUCTION Right 06/25/2019    reduction - Dr. Lowella Dell    PR MASTECTOMY SIMPLE COMPLETE Left 06/25/2019    Had radiation        ALLERGIES  Review of patient's allergies indicates:  Allergies   Allergen Reactions    Oxycodone Skin: Itching    Codeine IW:LNLGXQ/JJHERDEY    [place]nut Skin: Rash    Paclitaxel Skin: Hives        MEDICATIONS    Current Outpatient Medications:     acetaminophen 500 MG tablet, Take 500 mg by mouth 2 times a day as needed., Disp: , Rfl:     cetirizine 10 MG tablet, Take 10 mg by mouth daily., Disp: , Rfl:     famotidine 20 MG tablet, Take 1 tablet by mouth 2 times a day, Disp: 180 tablet, Rfl: 0    ibuprofen 600 MG tablet, Take 1 tablet by mouth every 6 hours, Disp: 30 tablet, Rfl: 0    letrozole 2.5 MG tablet, Take 1 tablet (2.5 mg) by mouth daily., Disp: 90 tablet, Rfl: 3    lisinopril 2.5 MG tablet, Take 1 tablet (2.5 mg) by mouth daily., Disp: 30 tablet, Rfl: 3    loperamide 2 MG capsule, Take 2 mg by mouth daily as needed., Disp: , Rfl:     metoprolol succinate ER 25 MG 24 hr tablet, Take 1/2 tablet (12.5 mg) by mouth daily. Do not chew or crush., Disp: 30 tablet, Rfl: 3    Pertuzumab (PERJETA IV), Inject intravenously every 3 weeks. , Disp: , Rfl:     polyethylene glycol 3350 17 GM/SCOOP oral powder, Fill cap with powder to the 17 gram mark and  dissolve in 8 ounces of water. Drink 2 times a day, Disp: 238 g, Rfl: 0    Trastuzumab (HERCEPTIN IV), Inject intravenously every 3 weeks. , Disp: , Rfl:     venlafaxine ER 75 MG 24 hr capsule, Take 1 capsule (75 mg) by mouth every morning., Disp: 30 capsule, Rfl: 1     SOCIAL HISTORY:   Social History     Tobacco Use    Smoking status: Never Smoker    Smokeless tobacco: Never Used   Substance Use Topics    Alcohol use: Not Currently    Drug use: Not Currently  FAMILY HISTORY:    Cancer-related family history includes Ovarian Cancer (age of onset: 62) in her paternal aunt; Skin Cancer in her maternal grandfather and mother.         Negative genetic panel testing, 11/2018.     PHYSICAL EXAMINATION   VITAL SIGNS: BP 113/79    Pulse 75    Temp 36.9 C (Oral)    Wt 69.4 kg (153 lb)    SpO2 97%    BMI 24.69 kg/m       GENERAL: Well appearing, well developed female in no acute distress.  HEENT: Normocephalic, atraumatic. Sclerae anicteric. Pupils are equal, round, and reactive to light. Moist mucosa membranes, pink without ulcers, lesions, or erythema.  NECK: Neck is supple without any appreciable lymphadenopathy or thyromegaly.  LUNGS: Respirations regular and unlabored. Clear to auscultation bilaterally.  CARDIAC: Regular rate and rhythm. No murmurs, gallops, or rubs.   BREASTS: S/p left mastectomy. Hyperpigmentation to left chest wall, consistent with post-radiation changes. No concerning nodules or skin changes. Right breast without palpable nodules, skin changes, or nipple changes. No axillary lymphadenopathy appreciated bilaterally.   Bilateral breasts without skin changes or retraction. No palpable masses, tenderness, or lesions. Nipples everted without discharge. No appreciable axillary lymphadenopathy bilaterally.  ABDOMEN: Soft, non-tender, non-distended. Normoactive bowel sounds. No hepatosplenomegaly or masses appreciated.  EXTREMITIES: No edema, clubbing, or cyanosis.  SKIN: No visible rash,  bruising, or petechiae.  MSK: Without muscle or joint deformities.  NEUROLOGICAL: Alert and oriented. Mental status appropriate.   NUTRITIONAL STATUS: Adequate.    LABORATORY  Results for orders placed or performed in visit on 01/30/20   CBC, Abs Neutrophil   Result Value Ref Range    WBC 4.00 (L) 4.3 - 10.0 10*3/uL    RBC 4.15 3.80 - 5.00 10*6/uL    Hemoglobin 12.3 11.5 - 15.5 g/dL    Hematocrit 36 36 - 45 %    MCV 86 81 - 98 fL    MCH 29.6 27.3 - 33.6 pg    MCHC 34.4 32.2 - 36.5 g/dL    Platelet Count 179 150 - 400 10*3/uL    RDW-CV 12.2 11.6 - 14.4 %    Absolute Neutrophil Comment Comment not required     Neutrophils 2.81 1.80 - 7.00 10*3/uL    Immature Granulocytes 0.01 0.00 - 0.05 10*3/uL    RBC Morphology See RBC data     Platelet Morphology See PLT count     WBC Morphology See Diff    Basic Metabolic Panel   Result Value Ref Range    Sodium 139 135 - 145 meq/L    Potassium 3.9 3.6 - 5.2 meq/L    Chloride 103 98 - 108 meq/L    Carbon Dioxide, Total 31 22 - 32 meq/L    Anion Gap 5 4 - 12    Glucose 98 62 - 125 mg/dL    Urea Nitrogen 15 8 - 21 mg/dL    Creatinine 0.77 0.38 - 1.02 mg/dL    Calcium 9.8 8.9 - 10.2 mg/dL    eGFR, Calculated >60 >59 mL/min/[1.73_m2]    GFR, Information       Calculated GFR by CKD-EPI equation. Inaccurate with changing renal function. See http://depts.[email or web address].html.   Hepatic Function Panel   Result Value Ref Range    Albumin 4.3 3.5 - 5.2 g/dL    Protein (Total) 6.7 6.0 - 8.2 g/dL    Bilirubin (Total) 0.5 0.2 - 1.3 mg/dL    Bilirubin (Direct) 0.1 0.0 -  0.3 mg/dL    Alkaline Phosphatase (Total) 43 34 - 121 U/L    AST (GOT) 21 9 - 38 U/L    ALT (GPT) 18 7 - 33 U/L   Lactate Dehydrogenase   Result Value Ref Range    Lactate Dehydrogenase 156 <210 U/L   Lipid Panel   Result Value Ref Range    Cholesterol (Total) 165 <200 mg/dL    Triglyceride 106 <150 mg/dL    Cholesterol (HDL) 63 >39 mg/dL    Cholesterol (LDL) 81 <130 mg/dL    Non-HDL Cholesterol 102 0 - 159  mg/dL    Cholesterol/HDL Ratio 2.6     Lipid Panel, Additional Info. (NOTE)        ASSESSMENT & PLAN  Yolanda Bowers is a 49 year old female with history of cT3N3 left breast invasive ductal carcinoma, ER/PR/HER2+, s/p neoadjuvant chemotherapy with ddAC-THP, left mastectomy and SLN biopsy, and adjuvant radiation therapy. She had a complete pathologic response at the time of surgery. Presents to clinic today for labs and follow up while on trastuzumab, pertuzumab, and letrozole.     1. Malignant neoplasm of upper-outer quadrant of left breast in female, estrogen receptor positive ([place])  Dyllan reports she is tolerating well with notable but currently manageable side effects.Fatigue and low EF are concerning. Will hold HEr/PER    Continue letrozole daily.   - Right Mammogram today normal per patient report, next due 12/2020.   - L  DEXA done 11/06/19 shows osteopenia, next due 10/2021.   - TransTHORACIC echo (TTE) complete; Future pending with cardiology    2. Peripheral neuropathy due to chemotherapy ([place])  - Continue glutamine as tolerated.  - Continue to focus on this in PT and acupuncture as well.   - praised he proactive approach to exercise, hope that accupuncture resumption will help wit h joint aches.   Anticipate that she can build back stamina  3. Pulmonary nodules -   Per CT 11/06/19, stable 3 mm nodule since 02/21/19, recommend repeat chest CT in 12 months to establish 2-year stability. New 1.9cm round glass nodule in the LUL, likely postradiation pneumonitis. Plan to repeat CT in 6 months (around 04/2020) due to this being a new finding.    4. Diarrhea, unspecified type  Likely related to pertuzumab  - well controlled with immodium    5. Osteopenia, unspecified location  DEXA 3/16 w/ osteopenia. Monitor q2 years while on endocrine therapy. Continue calcium and vitamin D.    6. Hot flashes  Currently tolerable with Effexor and acupuncture. Continue both and let team know if worsens.     7. Depressed mood  -  Followed by MSW , psychand involved in support group    DISPOSITION  Patient will return in 6 weeks for labs and toxicity assessment. Encouraged to call in the interim with any new symptoms.

## 2020-01-31 ENCOUNTER — Other Ambulatory Visit: Payer: Self-pay

## 2020-01-31 ENCOUNTER — Telehealth (HOSPITAL_BASED_OUTPATIENT_CLINIC_OR_DEPARTMENT_OTHER): Payer: Self-pay | Admitting: Cardiovascular Disease

## 2020-01-31 ENCOUNTER — Ambulatory Visit (HOSPITAL_BASED_OUTPATIENT_CLINIC_OR_DEPARTMENT_OTHER): Payer: Commercial Managed Care - PPO | Admitting: Psychiatry

## 2020-01-31 DIAGNOSIS — I427 Cardiomyopathy due to drug and external agent: Secondary | ICD-10-CM

## 2020-01-31 DIAGNOSIS — T451X5A Adverse effect of antineoplastic and immunosuppressive drugs, initial encounter: Secondary | ICD-10-CM

## 2020-01-31 DIAGNOSIS — I493 Ventricular premature depolarization: Secondary | ICD-10-CM

## 2020-01-31 NOTE — Progress Notes (Signed)
This encounter was opened in error; appt canceled.

## 2020-01-31 NOTE — Telephone Encounter (Signed)
I called patient to notify her that Dr [name] wants to see her in a telemedicine visit on Monday 6/28 and that patient should have an echo the week before.    Patient is available on 6/28 for a telemedicine appt.  She agreed to have echocardiogram on Wed 6/23 at 2:40 hear at [facility].  Echo team notified to book the appointment.    Patient needs new prescriptions for lisinopril and metoprolol sent to [facility] mail order pharmacy.Marland Kitchen

## 2020-02-01 ENCOUNTER — Ambulatory Visit: Payer: Commercial Managed Care - PPO | Attending: Psychiatry | Admitting: Psychiatry

## 2020-02-01 ENCOUNTER — Encounter (HOSPITAL_BASED_OUTPATIENT_CLINIC_OR_DEPARTMENT_OTHER): Payer: Self-pay

## 2020-02-01 DIAGNOSIS — F4322 Adjustment disorder with anxiety: Secondary | ICD-10-CM | POA: Insufficient documentation

## 2020-02-01 DIAGNOSIS — F339 Major depressive disorder, recurrent, unspecified: Secondary | ICD-10-CM

## 2020-02-01 DIAGNOSIS — R232 Flushing: Secondary | ICD-10-CM | POA: Insufficient documentation

## 2020-02-01 DIAGNOSIS — F3341 Major depressive disorder, recurrent, in partial remission: Secondary | ICD-10-CM | POA: Insufficient documentation

## 2020-02-01 DIAGNOSIS — R4189 Other symptoms and signs involving cognitive functions and awareness: Secondary | ICD-10-CM

## 2020-02-01 MED ORDER — VENLAFAXINE HCL ER 75 MG OR CP24
75.0000 mg | EXTENDED_RELEASE_CAPSULE | Freq: Every morning | ORAL | 0 refills | Status: DC
Start: 2020-02-01 — End: 2020-04-15

## 2020-02-01 NOTE — Progress Notes (Signed)
[facility] Telemedicine Encounter    I conducted this encounter from [facility] via secure, live, face-to-face video conference with the patient. Miah was located at home in [place], [place], without others present.  Prior to the interview, the risks and benefits of telemedicine were discussed with the patient and verbal consent was obtained.      [facility] PSYCHIATRY FOLLOW-UP NOTE   ID: Yolanda Bowers is a 49 year old female with cT3N3 left breast invasive ductal carcinoma, ER/PR/HER2+ (dx 11/2018), s/p neoadjuvant THP and L mastectomy (06/2019) and adjuvant radiation, now on adjuvant trastuzumab-pertuzumab (currently held for cardiac concerns) and letrozole, returning for psychiatric follow-up of depression and anxiety.    Chief Complaint: Anxiety and Depression    INTERVAL HX:   Last seen 5/4; venlafaxine continued at 75 mg given improvements. Chart reviewed, including medical oncology, lab, PT, infusion, cardiology, Child Life. She was started on lisinopril with plans to hold further Herceptin/Perjeta pending further cardiac workup of declining cardiac function.     Depression: "I think I'm holding up ok for the most part." Deep anger and irritability are resolved. She notes expected sadness with news of cardiac impacts, noting a balance between heart failure and cancer. She does understand that cardiology has not diagnosed her with heart failure. We explored concerns, including understanding of LVEF. 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 She plans to visit her older son in [place] and is looking forward to time outside and with her children. She found Child Life specialist helpful. She is hoping to reconnect with travel and other parts of herself. She shares gratitude for long-term friends.    Anxiety: She feels more able to regulate emotions and  Irritability. Frustration tolerance is improved.    Fatigue: Still significant (physical fatigue). 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 She has used  no family leave as part of her treatment and has this available ifneeded.    Concentration: Some STM lapses since treatment, mild, stable.    Appetite has returned to baseline. Sleep is improved.    She had to stop acupuncture due to scheduling limitations; joint aches are worse. Hot flashes are "so much better," even stopping acupuncture. "Not doubled over and flushing" anymore.    She is doing yoga weekly and [employer] exercise. She is not doing cardiac rehab or PT. She slept well after mowing the lawn; felt good building exercise and contributing.    Review of Systems  See interval history above.     Current Outpatient Medications   Medication Sig Dispense Refill   . acetaminophen 500 MG tablet Take 500 mg by mouth 2 times a day as needed.     . cetirizine 10 MG tablet Take 10 mg by mouth daily.     . famotidine 20 MG tablet Take 1 tablet by mouth 2 times a day 180 tablet 0   . ibuprofen 600 MG tablet Take 1 tablet by mouth every 6 hours 30 tablet 0   . letrozole 2.5 MG tablet Take 1 tablet (2.5 mg) by mouth daily. 90 tablet 3   . lisinopril 2.5 MG tablet Take 1 tablet (2.5 mg) by mouth daily. 30 tablet 3   . loperamide 2 MG capsule Take 2 mg by mouth daily as needed.     . metoprolol succinate ER 25 MG 24 hr tablet Take 1/2 tablet (12.5 mg) by mouth daily. Do not chew or crush. 30 tablet 3   . Pertuzumab (PERJETA IV) Inject intravenously  every 3 weeks.      . polyethylene glycol 3350 17 GM/SCOOP oral powder Fill cap with powder to the 17 gram mark and dissolve in 8 ounces of water. Drink 2 times a day 238 g 0   . Trastuzumab (HERCEPTIN IV) Inject intravenously every 3 weeks.      Marland Kitchen venlafaxine ER 75 MG 24 hr capsule Take 1 capsule (75 mg) by mouth every morning. 30 capsule 1     No current facility-administered medications for this visit.        MENTAL STATUS EXAM:   Appearance: Well dressed, well groomed. Alert, awake, in no apparent distress.  Behavior: Cooperative with good eye contact  Motor: No  tremors or abnormal movements noted. Exam limited due to telehealth visit.  Speech: Normal rate, rhythm, and tone with appropriate volume    Mood:  "I think I'm holding up ok for the most part."   Affect: Mood congruent, not tearful, fuller range  Thought process: Linear, logical, goal-oriented     Thought content: No SI/HI/AVH  Insight: Good; better at recognizing anxious thought patterns   judgment: Good  Orientation: Oriented to self, place, situation   Attention: Good  Memory: Grossly intact; not formally tested    SCREENING TOOLS:  Initial Pathway PHQ-9 Score    Flowsheets    Last refreshed: 02/01/2020  4:45 PM: Depression Care Pathway Initial PHQ-9 Not on file         Current as of: 02/01/2020  4:45 PM          Recent PHQ-9 Scores     PHQ9 Date 11/22/2019 12/25/2019 02/01/2020    PHQ9 Total Score _0 GAD7 Total Scores       11/22/2019 12/25/2019 02/01/2020       Total:  _1 LABS/IMAGES/STUDIES: Data was reviewed and relevant information is discussed in assessment and plan.    SAFETY ASSESSMENT:  Predisposing risk factors: Oncology history, Depression, Anxiety and trauma history with chronic intermittent thoughts of death                     Dynamic risk factors: Depression, Anxiety and Insomnia                             Protective risk factors: Support from medical and mental health teams, moral prohibition to suicide, adaptive coping skills, high level of function, resilient, identity as mother, denies access to guns or weapons, improved mood/anxiety                          Imminence: Patient denies any current intent or plans of suicide or self-harm.                         Overall risk: Low risk                            Justification for level of care: Outpatient care given ability to maintain function and present to treatment                Interventions employed: Medication management  , Supportive therapy and Psychoeducation      ASSESSMENT:  Yolanda Bowers is a 49 year old  female with [id number] left breast invasive ductal carcinoma, ER/PR/HER2+ (dx 11/2018), s/p neoadjuvant THP and L mastectomy (06/2019) and adjuvant radiation, now on adjuvant trastuzumab and pertuzumab, evaluated by [facility] psychiatry for concern of depression from her oncology team.  Her initial presentation was consistent with recurrent major depressive disorder, as well as adjustment disorder with anxiety.  She reports mild cognitive changes that may represent cancer-related cognitive dysfunction, and/or impacts of anxiety and depression.  Her initial PHQ 9 and GAD 7 scores underestimated her symptom burden at presentation, such that scores today reflect less improvement than she has experienced.  She had improved mood/anxiety, irritability, and hot flashes with venlafaxine, but had inadvertently been using BID, which may have driven some restlessness and early morning awakenings. In May, we consolidated to AM dosing.    She continues to no benefit to mood, irritability, and anxiety, as well as hot flashes, with use of venlafaxine.  Medication, in combination with work in therapy and efforts of her own, have yielded significant reductions in her depression and anxiety symptoms, consistent with partial to nearly full remission.  She continues to face complications from treatment, and we agreed that support this summer would be beneficial.  I encouraged her plans to connect with meaningful activities and rebuild strength and activity, as she will have more bandwidth over the summer.    DIAGNOSES & PLAN:   1. Episode of recurrent major depressive disorder, in partial remission (HCC)  - venlafaxine ER 75 MG 24 hr capsule; Take 1 capsule (75 mg) by mouth every morning.  Dispense: 7  - Return Visit Psychiatry; Future  - Continue behavioral activation: schedule activities for enjoyment, meaning, pleasure, particularly travel, exercise, time with friends and children    2. Adjustment disorder with anxious mood  - venlafaxine ER  75 MG 24 hr capsule; Take 1 capsule (75 mg) by mouth every morning.  - Return Visit Psychiatry; Future  - Continue with [facility] social work as needed  - Continue weekly therapy through [facility] for work-related stressors  -Strongly support engagement with child life    3. Hot flashes  - venlafaxine ER 75 MG 24 hr capsule; Take 1 capsule (75 mg) by mouth every morning.  Dispense: 30 capsule; Refill: 1  - Continue acupuncture, which she is finding helpful  - Return Visit Psychiatry; Future    4. Cognitive changes- mild  - Continue to monitor    5. Follow-up  - Ordered for 7/13  - Explore transition to [facility] or community psychiatry if longer-term follow-up is needed    Medical considerations:  - Do not expect venlafaxine further contributing to decrease in LVEF  - Labs 01/30/20 demonstrate normal Plt, LFTs, Na, and Cr; no dose adjustment needed  - BP 113/79 in clinic 01/30/20, reassuring while on venlafaxine  - ECG 01/11/20 notable for QTc 450, reassuring while on SNRI    I have discussed the treatment plan with the patient. The patient confirms understanding the risks and benefits of treatment, the alternatives, and has consented to the plan as outlined above.     THERAPY:   Time: 40 minutes  Techniques: Supportive Therapy and Insight-oriented Therapy  Themes:  Support and reassurance, Normalization and validation of feelings, Coping with diagnosis, Psychosocial stressors, Tolerating uncertainty, Depression, Coping skills and Behavioral strategies  Plan:  - Continue with psychotherapy and medication management.    I have discussed the treatment plan with the patient. The patient confirms understanding the risks and benefits of treatment, the alternatives,  and has consented to the plan as outlined above.     Thereasa Parkin, MD

## 2020-02-01 NOTE — Telephone Encounter (Signed)
Handled in phone encounter 01/31/20.

## 2020-02-02 ENCOUNTER — Telehealth (HOSPITAL_BASED_OUTPATIENT_CLINIC_OR_DEPARTMENT_OTHER): Payer: Self-pay | Admitting: Cardiovascular Disease

## 2020-02-02 ENCOUNTER — Encounter (HOSPITAL_BASED_OUTPATIENT_CLINIC_OR_DEPARTMENT_OTHER): Payer: Commercial Managed Care - PPO

## 2020-02-02 NOTE — Telephone Encounter (Signed)
Called pt to confirm telemed appt on 6/28 att 11am, she said yes. Sent her zoom info via mychart.

## 2020-02-04 MED ORDER — LISINOPRIL 2.5 MG OR TABS
2.5000 mg | ORAL_TABLET | Freq: Every day | ORAL | 3 refills | Status: DC
Start: 2020-02-04 — End: 2020-02-18

## 2020-02-04 MED ORDER — METOPROLOL SUCCINATE ER 25 MG OR TB24
12.5000 mg | EXTENDED_RELEASE_TABLET | Freq: Every day | ORAL | 3 refills | Status: DC
Start: 2020-02-04 — End: 2020-02-18

## 2020-02-13 ENCOUNTER — Ambulatory Visit
Admission: RE | Admit: 2020-02-13 | Discharge: 2020-02-13 | Disposition: A | Discharge status: Home / Self Care | Payer: Commercial Managed Care - PPO | Attending: Cardiovascular Disease | Admitting: Cardiovascular Disease

## 2020-02-13 ENCOUNTER — Other Ambulatory Visit: Payer: Self-pay

## 2020-02-13 DIAGNOSIS — T451X5A Adverse effect of antineoplastic and immunosuppressive drugs, initial encounter: Secondary | ICD-10-CM | POA: Insufficient documentation

## 2020-02-13 DIAGNOSIS — I493 Ventricular premature depolarization: Secondary | ICD-10-CM | POA: Insufficient documentation

## 2020-02-13 DIAGNOSIS — I427 Cardiomyopathy due to drug and external agent: Secondary | ICD-10-CM | POA: Insufficient documentation

## 2020-02-13 LAB — TRANSTHORACIC ECHO (TTE) LIMITED
IVSd: 0.68 cm
LV Systolic Volume (BP): 58 ml
LVIDd: 5.2 cm
LVIDs: 3.9 cm
LVPWd: 0.64 cm
RVDd: 3.7 cm
RVDd: 3.7 cm

## 2020-02-18 ENCOUNTER — Other Ambulatory Visit (HOSPITAL_BASED_OUTPATIENT_CLINIC_OR_DEPARTMENT_OTHER): Payer: Self-pay | Admitting: Medical Oncology

## 2020-02-18 ENCOUNTER — Ambulatory Visit: Payer: Commercial Managed Care - PPO | Attending: Cardiovascular Disease | Admitting: Cardiovascular Disease

## 2020-02-18 DIAGNOSIS — T451X5A Adverse effect of antineoplastic and immunosuppressive drugs, initial encounter: Secondary | ICD-10-CM | POA: Insufficient documentation

## 2020-02-18 DIAGNOSIS — I493 Ventricular premature depolarization: Secondary | ICD-10-CM | POA: Insufficient documentation

## 2020-02-18 DIAGNOSIS — I427 Cardiomyopathy due to drug and external agent: Secondary | ICD-10-CM | POA: Insufficient documentation

## 2020-02-18 MED ORDER — CARVEDILOL 3.125 MG OR TABS
3.1250 mg | ORAL_TABLET | Freq: Two times a day (BID) | ORAL | 3 refills | Status: DC
Start: 2020-02-18 — End: 2020-03-13

## 2020-02-18 MED ORDER — LISINOPRIL 2.5 MG OR TABS
2.5000 mg | ORAL_TABLET | Freq: Two times a day (BID) | ORAL | 3 refills | Status: DC
Start: 2020-02-18 — End: 2020-03-22

## 2020-02-18 NOTE — Progress Notes (Signed)
Distant Site Telemedicine Encounter    I conducted this encounter from [facility] via secure, live, face-to-face video conference with the patient. Yolanda Bowers was located at home. Prior to the interview, the risks and benefits of telemedicine were discussed with the patient and verbal consent was obtained.      Time of visit:  1020am    Reason for follow-up:   Abnormal echo    History of Present Illness  Yolanda Bowers is a 49 year old female with history of high-grade infiltrating ductal carcinoma of the left breast (ER+/PR+, FISH +) with angiolymphatic invasion, history of neoadjuvant chemotherapy with ddAC-THP, left mastectomy with lymph node biopsy, receiving Herceptin and Perjeta.  Recent echocardiogram showed decline in EF.  She is here for follow-up after repeat imaging last week.     Yolanda Bowers reports having more energy since starting the lisinopril and Toprol-XL.  She has more functional capacity with less fatigue.  However due to the heat wave, she is noticing challenges walking her dog.  Her blood pressure has also been increasing on average in the 110-120/70s.  She occasionally have heart rates in the 40s to 50s but she notes some premature beats during that time.  She denies any nausea, vomiting, lower extremity edema.  She notes more bloating.  She denies any passing out spells.  Overall, she reports symptomatic improvement.    She did receive Herceptin and Perjeta the week of her visit with me at the end of May.  Her last dose is currently pending based on today's visit.      Past Medical History  Patient Active Problem List    Diagnosis Date Noted    Chemotherapy induced cardiomyopathy ([place]) [I42.7, T45.1X5A] 01/19/2020    Malignant neoplasm of upper-outer quadrant of left breast in female, estrogen receptor positive ([place]) [C50.412, Z17.0] 11/18/2019    Macromastia [N62] 08/31/2019    CIN I (cervical intraepithelial neoplasia I) [N87.0] 06/01/2018       Allergies  Review of patient's allergies  indicates:  Allergies   Allergen Reactions    Oxycodone Skin: Itching    Codeine JF:HLKTGY/BWLSLHTD    Walnut Skin: Rash    Paclitaxel Skin: Hives       Current Medications:  Current Outpatient Medications   Medication Sig Dispense Refill    acetaminophen 500 MG tablet Take 500 mg by mouth 2 times a day as needed.      cetirizine 10 MG tablet Take 10 mg by mouth daily.      famotidine 20 MG tablet Take 1 tablet by mouth 2 times a day 180 tablet 0    ibuprofen 600 MG tablet Take 1 tablet by mouth every 6 hours 30 tablet 0    letrozole 2.5 MG tablet Take 1 tablet (2.5 mg) by mouth daily. 90 tablet 3    lisinopril 2.5 MG tablet Take 1 tablet (2.5 mg) by mouth daily. 90 tablet 3    loperamide 2 MG capsule Take 2 mg by mouth daily as needed.      metoprolol succinate ER 25 MG 24 hr tablet Take 1/2 tablet (12.5 mg) by mouth daily. Do not chew or crush. 45 tablet 3    Pertuzumab (PERJETA IV) Inject intravenously every 3 weeks.       polyethylene glycol 3350 17 GM/SCOOP oral powder Fill cap with powder to the 17 gram mark and dissolve in 8 ounces of water. Drink 2 times a day 238 g 0    Trastuzumab (HERCEPTIN IV) Inject  intravenously every 3 weeks.       venlafaxine ER 75 MG 24 hr capsule Take 1 capsule (75 mg) by mouth every morning. 90 capsule 0     No current facility-administered medications for this visit.        Family history:   Family History     Problem (# of Occurrences) Relation (Name,Age of Onset)    Alcohol/Drug (1) Maternal Grandfather    Diabetes (1) Mother    Hearing Loss (2) Mother, Father    Obesity (2) Mother, Maternal Grandfather    Ovarian Cancer (1) Paternal Aunt (94)    Skin Cancer (2) Mother, Maternal Grandfather          Social history:   Social History     Social History Narrative    Lives in [place] with her husband and two children, ages 83 and 70. She works as an Metallurgist.        Review of Systems:    Otherwise negative except as noted in history of present illness and past  medical history.    Physical Exam:  There were no vitals taken for this visit.  Wt Readings from Last 3 Encounters:   01/30/20 69.4 kg (153 lb)   01/12/20 69.6 kg (153 lb 7 oz)   01/11/20 69.3 kg (152 lb 12.8 oz)     01/11/2020:  BNP 10  Troponin <0.03    Telemonitor JVP:  Not elevated    Echo: 03/28/2019:  left ventricular end-diastolic dimension of 1.6XW, EF of 60%, strain -20%, trabeculated LV, normal valves, mild LAE, normal diastology    echocardiogram from 08/14/2019:  left ventricular end-diastolic dimension of 9.6EA, EF of 59%, GLS -20%, no change from 03/28/2019    Echocardiogram from 01/02/2020:   left ventricular end-diastolic dimension of 5.4UJ, EF of 51%, GLS -13%, normal RV function, normal valves, decreased function compared to 08/14/2019    Echocardiogram from 02/13/2020:  left ventricular end-diastolic dimension of 8.1XB, EF of 45%, GLS -16%, normal RV size and function    Assessment:  49 year old female with history of high-grade infiltrating ductal carcinoma of the left breast (ER+/PR+, FISH +) with angiolymphatic invasion, history of neoadjuvant chemotherapy with ddAC-THP, left mastectomy with lymph node biopsy, receiving Herceptin and Perjeta with decline in cardiac function to 45% on recent echocardiogram from last week, but comparison studies show no dramatic change including strain.  She is well compensated on examination and is tolerating the initiation of ACE inhibitor and beta-blocker.  I am encouraged that her blood pressure has been increasing.     I spoke with Dr. Charna Archer regarding her thoughts on holding off on additional Herceptin/Perjeta treatment.  She felt comfortable holding the last dose of HER2 targeted therapies due to the recent echocardiogram results.  I am encouraged that her left ventricular chamber dimensions have decreased since her prior echocardiograms since starting ACEI and beta-blockade as well as subjective improvement.  As a result, my recommendation would be to continue  up titration of lisinopril and switch the Toprol to carvedilol to maintain blood pressure of 100/70.  She was encouraged to continue with her current level of activity, monitor blood pressure and heart rate, and report any symptomatic changes.  She will also benefit with a repeat echocardiogram in 8 to 10 weeks.     She likely has PVCs that is contributing to her heart rate being reported at 40-50 bpm, but in reality, her heart rate is greater due to the blood pressure  monitor not counting the premature beats.  We will monitor for now.  If she is having more heart rates in the 40-50s, I would like to obtain a holter monitor at that time.    Plan:  1.  Increase lisinopril to 2.'5mg'$  BID and switch the toprol XL to carvedilol 3.'125mg'$  BID.  2.  The patient was instructed to monitor blood pressure and heart rate daily and contact us if blood pressure is <90/70 or if she has worsening dizziness  3.  Repeat echocardiogram in 8-10 weeks.  4.  Hold additional HER2 targeted therapies for now.      Follow-up:  Follow-up in 2 weeks via phone call to follow-up on blood pressure and symptoms.  Face-to-face visit in 8-10 weeks. Patient will call sooner if symptoms change.     Total time of >25 minutes was spent face-to-face with the patient of which more than 50% was spent counseling and coordinating care as outlined in this note.    Juanetta Beets, MD  Division of Cardiology  Advanced Heart Failure/Cardiac Transplantation  02/18/2020

## 2020-02-22 ENCOUNTER — Telehealth (HOSPITAL_BASED_OUTPATIENT_CLINIC_OR_DEPARTMENT_OTHER): Payer: Self-pay | Admitting: Cardiovascular Disease

## 2020-02-22 NOTE — Telephone Encounter (Signed)
LVM for patient to call me back regarding increased lisinopril and switch to carvedilol from metoprolol.  Did you make the transition to lisinopril BID and stop metoprolol and start carvedilol?  Do you have any BP readings yet?  Gave call back number.

## 2020-02-26 ENCOUNTER — Encounter (HOSPITAL_BASED_OUTPATIENT_CLINIC_OR_DEPARTMENT_OTHER): Payer: Self-pay | Admitting: Cardiovascular Disease

## 2020-02-26 ENCOUNTER — Other Ambulatory Visit (HOSPITAL_BASED_OUTPATIENT_CLINIC_OR_DEPARTMENT_OTHER): Payer: Self-pay | Admitting: Cardiovascular Disease

## 2020-02-26 DIAGNOSIS — T451X5S Adverse effect of antineoplastic and immunosuppressive drugs, sequela: Secondary | ICD-10-CM

## 2020-02-27 ENCOUNTER — Ambulatory Visit
Admission: RE | Admit: 2020-02-27 | Discharge: 2020-02-27 | Disposition: A | Discharge status: Home / Self Care | Payer: Commercial Managed Care - PPO | Attending: Cardiovascular Disease | Admitting: Cardiovascular Disease

## 2020-02-27 DIAGNOSIS — T451X5S Adverse effect of antineoplastic and immunosuppressive drugs, sequela: Secondary | ICD-10-CM | POA: Insufficient documentation

## 2020-02-28 ENCOUNTER — Telehealth (HOSPITAL_BASED_OUTPATIENT_CLINIC_OR_DEPARTMENT_OTHER): Payer: Self-pay | Admitting: Cardiovascular Disease

## 2020-02-28 NOTE — Procedure Nursing Note (Signed)
Mailed 7 Day CAM to patient and should receive in 3-5 days.

## 2020-02-28 NOTE — Telephone Encounter (Signed)
From 02/18/20 Telemedicine visit note:    Plan:  1.  Increase lisinopril to 2.'5mg'$  BID and switch the toprol XL to carvedilol 3.'125mg'$  BID.  2.  The patient was instructed to monitor blood pressure and heart rate daily and contact us if blood pressure is <90/70 or if she has worsening dizziness  3.  Repeat echocardiogram in 8-10 weeks.  4.  Hold additional HER2 targeted therapies for now.      Follow-up:  Follow-up in 2 weeks via phone call to follow-up on blood pressure and symptoms.  Face-to-face visit in 8-10 weeks. Patient will call sooner if symptoms change.

## 2020-03-03 NOTE — Progress Notes (Signed)
[facility]  BREAST ONCOLOGY CLINIC NOTE    ATTENDING ONCOLOGIST  Dr. Exie Parody    IDENTIFICATION  Yolanda Bowers is a 49 year old female with history of cT3N3 left breast invasive ductal carcinoma, ER/PR/HER2+, s/p neoadjuvant chemotherapy with ddAC-THP, left mastectomy and SLN biopsy, and adjuvant radiation therapy. She had a complete pathologic response at the time of surgery. Presents to clinic today for labs and follow up while on letrozole.    ONCOLOGIC HISTORY     Oncology History Overview Note   10/2018: [name]   11/24/2018: Bilateral mammogram revealed a negative right breast. To the left breast UOQ, there was an 77m mass and a 2.4 cm mass laterally, 17 cm from the nipple. Targeted left breast ultrasound revealed at 3 o'clock 8 cm from the nipple, a 21x13x277mmass and an enlarged axillary lymph node measuring 12x11x1561mThere were multiple enlarged lymph nodes  11/27/2018: US-guided biopsy of the breast mass revealed invasive ductal carcinoma, ER/PR+, HER2+, grade 3. There was angiolymphatic invasion seen on biopsy. Biopsy of axillary lymph node positive for carcinoma.   12/08/2018: Bilateral breast MRI revealed right breast at 3 o'clock, 2.7cm from the nipple, a 10 x 6 x8mm38mss and at 12 o'clock, 11cm from the nipple, a spiculated 16 x 4 x 13mm83ms. Ultrasound recommended.  To the left breast, mass consistent with biopsy proved carcinoma measuring 24 x 20 x 24mm.18mre was also diffuse NME involving mid-outer breast extending over 110x45x69mm. 9min the NME, there was a small spiculated mass at 12 o'clock measuring 12mm. M87mple level 1,2 and 3 lymph nodes seen, in addition to at least 3 internal mammary nodes.   12/14/2018: PET CT without evidence of distant metastasis. There are 4mm lung82mdules bilaterally.   12/29/2018: Started on neoadjuvant chemotherapy with ddAC x4 cycles.  03/09/2019: Started on neoadjuvant  chemotherapy with Taxol, Herceptin, and Perjeta.  06/25/2019: Underwent left mastectomy and SLN biopsy with Dr. David ByrImagene Shellergy revealed a single focus of angiolymphatic involvement by carcinoma (< 0.1cm). Negative surgical margins. 0 of 5 lymph nodes positive for carcinoma. ypT0 ypN0.   09/21/2019: Completed XRT.  Started on letrozole 2.'5mg'$  PO daily.     01/29/2020: Given drop in LVEF, last Herceptin/Perjeta infusion was discontinued. Letrozole continued.    Negative genetic panel testing, 11/2018.     Malignant neoplasm of upper-outer quadrant of left breast in female, estrogen receptor positive ([facility])   3/[place]2021 - 01/29/2020 Systemic Therapy    pertuzumab (Perjeta) 840 mg in sodium chloride 0.9 % 250 mL IVPB, Intravenous, 4 of 6 cycles  trastuzumab (Herceptin) 593 mg in sodium chloride 0.9 % 250 mL IVPB, Intravenous, 4 of 6 cycles     11/18/2019 Initial Diagnosis    Malignant neoplasm of upper-outer quadrant of left breast in female, estrogen receptor positive ([facility])     [place]  INTERVAL HISTORY  Yolanda Bowers seen by Dr. Linden onCharna Archer021.  In the interim, Herceptin and Perjeta have been discontinued due to lower ejection fraction.  She continues to follow with cardio-oncology and has been started on cardioprotective medications.  She is currently wearing a Holter monitor due to low heart rates in the 40s.  She notes palpitations a few times every day.  She has been checking her blood pressure at home and notes that it is lower at home.  She uses a oximeter and occasionally her heart rate  will be in the 70s with this.  She has occasional lightheaded and dizziness.  She notes occasional headaches that are not new or different.    She now endorses rare hot flashes, approximately only one a week with use of Effexor.  She has not been doing acupuncture and feels that she is able to manage her side effects on her own with exercise.  She notes that movement improves her shoulders and hip pain.  She walks  and does yoga for this.  She will take an occasional Tylenol when it gets really bad, approximately once a week.  Continues to have decreased range of motion to her left shoulder.  She is working with physical therapy and wonders if this is still needed.  She continues to have mild swelling to her left axilla ever since surgery.  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  This does not affect her IADLs but does have some impact on her dexterity.  She denies any to the toes.  She notes that this neuropathy is more tingly than previously.    She continues to have a cough in the morning.  This starts is dry and then becomes productive after a walk and then it resolves.  She endorses shortness of breath with exertion.  Has had 4-day bout of diarrhea and urgency, which she relates to something she ate.  She has not needed to take any medication for this.  She denies any fevers chills or recent antibiotic use.  She does not feel like that she is hydrating as well as she probably should be.  She endorses occasional reflux that responds to Tums or famotidine.  She denies any chest wall or breast concerns today.      REVIEW OF SYSTEMS  A complete review of systems was conducted and is negative in detail, except as noted in the interval history.    PAST MEDICAL HISTORY  Patient Active Problem List   Diagnosis   . Macromastia   . Malignant neoplasm of upper-outer quadrant of left breast in female, estrogen receptor positive ([place])   . CIN I (cervical intraepithelial neoplasia I)   . Chemotherapy induced cardiomyopathy ([place])   . PVC's (premature ventricular contractions)       PAST SURGICAL HISTORY  Past Surgical History:   Procedure Laterality Date   . BREAST BIOPSY Right 11/2018    2 core bx     . BREAST RECONSTRUCTION Right 06/25/2019    reduction - Dr. Lowella Dell   . PR MASTECTOMY SIMPLE COMPLETE Left 06/25/2019    Had radiation        ALLERGIES  Review of patient's allergies indicates:  Allergies    Allergen Reactions   . Oxycodone Skin: Itching   . Codeine PX:TGGYIR/SWNIOEVO   . Walnut Skin: Rash   . Paclitaxel Skin: Hives        MEDICATIONS    Current Outpatient Medications:   .  acetaminophen 500 MG tablet, Take 500 mg by mouth 2 times a day as needed., Disp: , Rfl:   .  carVEDilol 3.125 MG tablet, Take 1 tablet (3.125 mg) by mouth 2 times a day., Disp: 180 tablet, Rfl: 3  .  cetirizine 10 MG tablet, Take 10 mg by mouth daily., Disp: , Rfl:   .  famotidine 20 MG tablet, Take 1 tablet by mouth 2 times a day, Disp: 180 tablet, Rfl: 0  .  letrozole 2.5 MG tablet, Take 1 tablet (2.5 mg) by mouth daily.,  Disp: 90 tablet, Rfl: 3  .  lisinopril 2.5 MG tablet, Take 1 tablet (2.5 mg) by mouth 2 times a day., Disp: 180 tablet, Rfl: 3  .  loperamide 2 MG capsule, Take 2 mg by mouth daily as needed., Disp: , Rfl:   .  Pertuzumab (PERJETA IV), Inject intravenously every 3 weeks. , Disp: , Rfl:   .  Trastuzumab (HERCEPTIN IV), Inject intravenously every 3 weeks. , Disp: , Rfl:   .  venlafaxine ER 75 MG 24 hr capsule, Take 1 capsule (75 mg) by mouth every morning., Disp: 90 capsule, Rfl: 0     SOCIAL HISTORY:   Social History     Tobacco Use   . Smoking status: Never Smoker   . Smokeless tobacco: Never Used   Substance Use Topics   . Alcohol use: Not Currently   . Drug use: Not Currently        FAMILY HISTORY:    Cancer-related family history includes Ovarian Cancer (age of onset: 75) in her paternal aunt; Skin Cancer in her maternal grandfather and mother.   Negative genetic panel testing, 11/2018.     PHYSICAL EXAMINATION   VITAL SIGNS: BP 116/65   Pulse (!) 38   Temp 36.7 C (Oral)   Resp 14   Wt 69.1 kg (152 lb 5.4 oz)   SpO2 99%   BMI 24.59 kg/m       GENERAL: Well appearing, well developed female in no acute distress.  HEENT: Normocephalic, atraumatic. Sclerae anicteric.   NECK: Neck is supple without any appreciable lymphadenopathy or thyromegaly.  LUNGS: Respirations regular and unlabored. Clear to  auscultation bilaterally.  CARDIAC: Sinus bradycardic. No murmurs, gallops, or rubs.   BREASTS: S/p left mastectomy. Hyperpigmentation to left chest wall, consistent with post-radiation changes. No concerning nodules or skin changes. Right breast without palpable nodules, skin changes, or nipple changes. No axillary lymphadenopathy appreciated bilaterally.  Tenderness to palpation of bilateral axilla, unchanged.  ABDOMEN: Soft, non-tender, non-distended. Normoactive bowel sounds. No hepatosplenomegaly or masses appreciated.  EXTREMITIES: No edema, clubbing, or cyanosis.  SKIN: No visible rash, bruising, or petechiae.  MSK: Without muscle or joint deformities.  No spinal or hip tenderness to palpation.  NEUROLOGICAL: Alert and oriented. Mental status appropriate.   NUTRITIONAL STATUS: Adequate.      ASSESSMENT & PLAN  Yolanda Bowers is a 49 year old female with history of cT3N3 left breast invasive ductal carcinoma, ER/PR/HER2+, s/p neoadjuvant chemotherapy with ddAC-THP, left mastectomy and SLN biopsy, and adjuvant radiation therapy. She had a complete pathologic response at the time of surgery. Presents to clinic today for labs and follow up while on letrozole.    1. Malignant neoplasm of upper-outer quadrant of left breast in female, estrogen receptor positive ([place])  Yolanda Bowers is doing well overall today from a breast cancer perspective without evidence of recurrence based on review of systems and clinical exam.  She is tolerating letrozole well with manageable side effects of joint aches and now rare hot flashes.  Consider acupuncture in the future if needed.  Praised patient for her focus on activity as tolerated.  We discussed a gradual exercise regimen.  Patient continues have a port in place and we discussed discontinuing Herceptin and Perjeta given her drop in ejection fraction; this will be 1 dose shy of 17.  Continue to follow with cardiology.  Patient has a Holter monitor today given her low heart rates.   Encouraged her to reach out sooner if she becomes  symptomatic, although asymptomatic today.  We discussed that she can remove her port but she would like to discuss with cardiology as this terminates in her heart.  Discussed recommendations for port flush every 4 to 6 weeks.  Discussed continuing physical therapy for left shoulder range of motion and swelling.  Frequency as determined by PT.  Discussed that she may need a work-up of her left shoulder as this sounds that she may have a rotator cuff injury as well.  Continue to monitor.  - Continue letrozole daily.   - Right Mammogram normal, next due 12/2020.   - L  DEXA done 11/06/19 shows osteopenia, next due 10/2021.   - Continue to follow with cardio oncology.    2. Pulmonary nodules   Per CT 11/06/19, stable 3 mm nodule since 02/21/19, recommend repeat chest CT in 12 months to establish 2-year stability. New 1.9cm round glass nodule in the LUL, likely postradiation pneumonitis. Plan to repeat CT in 6 months (around 04/2020) due to this being a new finding.     3. Osteopenia, unspecified location  DEXA 3/16 w/ osteopenia. Monitor q2 years while on endocrine therapy. Continue calcium and vitamin D.    4. Hot flashes  Currently tolerable with Effexor. Continue and let team know if worsens.      5. Depressed mood  - Followed by MSW , psych and involved in support group.     DISPOSITION  Patient will return in 8 weeks for labs and toxicity assessment. Encouraged to call in the interim with any new symptoms.

## 2020-03-04 ENCOUNTER — Ambulatory Visit: Payer: Commercial Managed Care - PPO | Attending: Psychiatry | Admitting: Psychiatry

## 2020-03-04 DIAGNOSIS — C50812 Malignant neoplasm of overlapping sites of left female breast: Secondary | ICD-10-CM | POA: Insufficient documentation

## 2020-03-04 DIAGNOSIS — F3341 Major depressive disorder, recurrent, in partial remission: Secondary | ICD-10-CM | POA: Insufficient documentation

## 2020-03-04 DIAGNOSIS — Z170 Estrogen receptor positive status [ER+]: Secondary | ICD-10-CM | POA: Insufficient documentation

## 2020-03-04 DIAGNOSIS — R4189 Other symptoms and signs involving cognitive functions and awareness: Secondary | ICD-10-CM | POA: Insufficient documentation

## 2020-03-04 DIAGNOSIS — Z79811 Long term (current) use of aromatase inhibitors: Secondary | ICD-10-CM | POA: Insufficient documentation

## 2020-03-04 DIAGNOSIS — F4322 Adjustment disorder with anxiety: Secondary | ICD-10-CM | POA: Insufficient documentation

## 2020-03-04 NOTE — Progress Notes (Signed)
Distant Site Telemedicine Encounter    I conducted this encounter from [facility] via secure, live, face-to-face video conference with the patient. Anahla was located at home in [place], [place], without others present.  Prior to the interview, the risks and benefits of telemedicine were discussed with the patient and verbal consent was obtained.      [facility] PSYCHIATRY FOLLOW-UP NOTE   ID: Yolanda Bowers is a 49 year old female with cT3N3 left breast invasive ductal carcinoma, ER/PR/HER2+ (dx 11/2018), s/p neoadjuvant THP and L mastectomy (06/2019) and adjuvant radiation, now on adjuvant trastuzumab-pertuzumab (currently held for cardiac concerns) and letrozole, returning for psychiatric follow-up of depression and anxiety.    Chief Complaint: Stress    INTERVAL HX:   Last seen 6/11; venlafaxine continued at 75 mg given benefit. Chart reviewed, including PMR (OT), med-onc correspondence, echo 6/23 noting EF 45%, cardiology (lisinopril increased and Toprol XL switched to carvedilol); holding HER2 therapies pending repeat echo. BPs are reported as low.    Anxiety: "I feel a lot less stressed [...] I feel like I have my life back." She notes that summer/school break is helping. She got anew job at a new school, which is a relief and adds some understandable anticipatory anxiety. No panic.    Depression: She notes more irritability, discusses family stressors. Reflects that she cried almost every day of cancer treatment, improved since starting treatment. She feels ina stronger place. Moods are now manageable. She is hopeful about life changes. 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 She has less urge to create, more drive to organize her house. She volunteers knowing she needs to set a schedule; this has worked in the past. No PDW/SI.    Fatigue: Continues; she relates this to cardiac pathology, noting further drop in EF, low BP, working more in her  garden/outside. She is still keeping goals to socialize and recognizes that she sometimes needs help. She is receiving feedback to rest and do less, including friends, husband, and her massage therapist. It does feel nice to relax.    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 Nervous about finger dexterity due to chemo. She feels optimistic.     We spent time in session concretely planning how to re-engage with art: canvas painting, Thursday, 9-11 am, solo. Also worked to schedule rest and preparation for her new job.    She reflects on challenges and sources of resilience in her life. She is exploring life changes and stages. We discussed changes in her body due to cancer and treatment.    Review of Systems  See interval history above.     Current Outpatient Medications   Medication Sig Dispense Refill   . acetaminophen 500 MG tablet Take 500 mg by mouth 2 times a day as needed.     . carVEDilol 3.125 MG tablet Take 1 tablet (3.125 mg) by mouth 2 times a day. 180 tablet 3   . cetirizine 10 MG tablet Take 10 mg by mouth daily.     . famotidine 20 MG tablet Take 1 tablet by mouth 2 times a day 180 tablet 0   . ibuprofen 600 MG tablet Take 1 tablet by mouth every 6 hours 30 tablet 0   . letrozole 2.5 MG tablet Take 1 tablet (2.5 mg) by mouth daily. 90 tablet 3   .  lisinopril 2.5 MG tablet Take 1 tablet (2.5 mg) by mouth 2 times a day. 180 tablet 3   . loperamide 2 MG capsule Take 2 mg by mouth daily as needed.     . Pertuzumab (PERJETA IV) Inject intravenously every 3 weeks.      . polyethylene glycol 3350 17 GM/SCOOP oral powder Fill cap with powder to the 17 gram mark and dissolve in 8 ounces of water. Drink 2 times a day 238 g 0   . Trastuzumab (HERCEPTIN IV) Inject intravenously every 3 weeks.      Marland Kitchen venlafaxine ER 75 MG 24 hr capsule Take 1 capsule (75 mg) by mouth every morning. 90 capsule 0     No current facility-administered medications for this visit.         MENTAL STATUS EXAM:   Appearance: Well dressed, well groomed. Alert, awake, in no apparent distress.  Behavior: Cooperative with good eye contact  Motor: No tremors or abnormal movements noted. Exam limited due to telehealth visit.  Speech: Normal rate, rhythm, and tone with appropriate volume    Mood:  "I feel like I have my life back:  Affect: Mood congruent, euthymic, reactive, full range  Thought process: Linear, logical, goal-oriented     Thought content: No SI/HI/AVH  Insight: Good; better at recognizing anxious thought patterns, though has high expectations of herself  Judgment: Good  Orientation: Oriented to self, place, situation   Attention: Good  Memory: Grossly intact; not formally tested    SCREENING TOOLS:  Initial Pathway PHQ-9 Score    Flowsheets    Last refreshed: 03/04/2020  8:47 AM: Depression Care Pathway Initial PHQ-9 Not on file         Current as of: 03/04/2020  8:47 AM          Recent PHQ-9 Scores     PHQ9 Date 11/22/2019 12/25/2019 02/01/2020 03/04/2020    PHQ9 Total Score '6 9 6 2        '$ GAD7 Total Scores       11/22/2019 12/25/2019 02/01/2020 03/04/2020    Total:  '5  5  1  1          '$ LABS/IMAGES/STUDIES: Data was reviewed and relevant information is discussed in assessment and plan.    SAFETY ASSESSMENT:  Predisposing risk factors: Oncology history, Depression, Anxiety and trauma history with chronic intermittent thoughts of death                     Dynamic risk factors: Depression, Anxiety and Insomnia                             Protective risk factors: Support from medical and mental health teams, moral prohibition to suicide, adaptive coping skills, high level of function, resilient, identity as mother, denies access to guns or weapons, improved mood/anxiety                          Imminence: Patient denies any current intent or plans of suicide or self-harm.                         Overall risk: Low risk                            Justification for level of care: Outpatient care given  ability  to maintain function and present to treatment                Interventions employed: Medication management  , Supportive therapy and Psychoeducation      ASSESSMENT:  Caoimhe Eilis Chestnutt is a 49 year old female with cT3N3 left breast invasive ductal carcinoma, ER/PR/HER2+ (dx 11/2018), s/p neoadjuvant THP and L mastectomy (06/2019) and adjuvant radiation, now on adjuvant trastuzumab and pertuzumab, evaluated by [facility] psychiatry for concern of depression from her oncology team.  Her initial presentation was consistent with recurrent major depressive disorder, as well as adjustment disorder with anxiety.  She reports mild cognitive changes that may represent cancer-related cognitive dysfunction, and/or impacts of anxiety and depression.  Her initial PHQ 9 and GAD 7 scores underestimated her symptom burden at presentation, such that scores today reflect less improvement than she has experienced.  She had improved mood/anxiety, irritability, and hot flashes with venlafaxine, but had inadvertently been using BID, which may have driven some restlessness and early morning awakenings. In May, we consolidated to AM dosing.    Elvera continues to report sustained improvement in anxiety, mood, and irritability with venlafaxine and supportive therapy. She is navigating cardiac toxicity related to HER-2 targeted therapies, which contributes to fatigue, even as she is working to increase her activity. She is making positive strides in setting/keeping goals and making positive life changes that reflect her needs and priorities.    DIAGNOSES & PLAN:   1. Episode of recurrent major depressive disorder, in partial remission (HCC)  - venlafaxine ER 75 MG 24 hr capsule; Take 1 capsule (75 mg) by mouth every morning.  No refill needed today.  - Return Visit Psychiatry; Future  - Continue behavioral activation, noting physical limitations and need for rest  - Schedule time for her art    2. Adjustment disorder with anxious mood  -  venlafaxine ER 75 MG 24 hr capsule; Take 1 capsule (75 mg) by mouth every morning.  - Return Visit Psychiatry; Future  - Continue with [facility] social work as needed  - Continue weekly therapy through [facility] for work-related stressors  - Encourage making schedule to help prepare for new job    3. Hot flashes  - venlafaxine ER 75 MG 24 hr capsule; Take 1 capsule (75 mg) by mouth every morning.   - Continue acupuncture, which she is finding helpful  - Return Visit Psychiatry; Future    4. Cognitive changes- mild  - Continue to monitor; not impairing    5. Follow-up  - Ordered for 8/24  - Explore transition to [facility] or community psychiatry this fall if longer-term follow-up is needed    Medical considerations:  - Do not expect venlafaxine further contributing to decrease in LVEF  - Labs 01/30/20 demonstrate normal Plt, LFTs, Na, and Cr; no dose adjustment needed  - BP readings have been low; no HTN, reassuring while on venlafaxine; monitoring through cardiology  - ECG 01/11/20 notable for QTc 450, reassuring while on SNRI (no update since last appt)    I have discussed the treatment plan with the patient. The patient confirms understanding the risks and benefits of treatment, the alternatives, and has consented to the plan as outlined above.     THERAPY:   Time: 40 minutes  Techniques: Supportive Therapy and Insight-oriented Therapy  Themes:  Support and reassurance, Normalization and validation of feelings, Coping with diagnosis, Psychosocial stressors, Tolerating uncertainty, Depression, Coping skills and Behavioral strategies  Plan:  - Continue  with psychotherapy and medication management.    I have discussed the treatment plan with the patient. The patient confirms understanding the risks and benefits of treatment, the alternatives, and has consented to the plan as outlined above.     Thereasa Parkin, MD

## 2020-03-05 ENCOUNTER — Ambulatory Visit: Payer: Commercial Managed Care - PPO | Attending: Physician Assistant | Admitting: Physician Assistant

## 2020-03-05 ENCOUNTER — Ambulatory Visit (HOSPITAL_BASED_OUTPATIENT_CLINIC_OR_DEPARTMENT_OTHER): Payer: Commercial Managed Care - PPO

## 2020-03-05 DIAGNOSIS — Z79811 Long term (current) use of aromatase inhibitors: Secondary | ICD-10-CM | POA: Insufficient documentation

## 2020-03-05 DIAGNOSIS — Z8041 Family history of malignant neoplasm of ovary: Secondary | ICD-10-CM | POA: Insufficient documentation

## 2020-03-05 DIAGNOSIS — F329 Major depressive disorder, single episode, unspecified: Secondary | ICD-10-CM | POA: Insufficient documentation

## 2020-03-05 DIAGNOSIS — R911 Solitary pulmonary nodule: Secondary | ICD-10-CM | POA: Insufficient documentation

## 2020-03-05 DIAGNOSIS — M8580 Other specified disorders of bone density and structure, unspecified site: Secondary | ICD-10-CM | POA: Insufficient documentation

## 2020-03-05 DIAGNOSIS — C50412 Malignant neoplasm of upper-outer quadrant of left female breast: Secondary | ICD-10-CM

## 2020-03-05 DIAGNOSIS — R232 Flushing: Secondary | ICD-10-CM | POA: Insufficient documentation

## 2020-03-05 DIAGNOSIS — Z170 Estrogen receptor positive status [ER+]: Secondary | ICD-10-CM | POA: Insufficient documentation

## 2020-03-05 DIAGNOSIS — Z808 Family history of malignant neoplasm of other organs or systems: Secondary | ICD-10-CM | POA: Insufficient documentation

## 2020-03-05 MED ORDER — LIDOCAINE HCL (PF) 1 % IJ SOLN
0.5000 mL | Freq: Once | INTRAMUSCULAR | Status: AC
Start: 2020-03-05 — End: 2020-03-05
  Administered 2020-03-05: 0.5 mL via INTRADERMAL

## 2020-03-05 NOTE — Addendum Note (Signed)
Addended by: Sigmund Hazel on: 03/05/2020 03:41 PM     Modules accepted: Orders

## 2020-03-06 ENCOUNTER — Encounter (HOSPITAL_COMMUNITY): Payer: Self-pay

## 2020-03-13 ENCOUNTER — Other Ambulatory Visit (HOSPITAL_BASED_OUTPATIENT_CLINIC_OR_DEPARTMENT_OTHER): Payer: Self-pay | Admitting: Cardiovascular Disease

## 2020-03-13 ENCOUNTER — Inpatient Hospital Stay (HOSPITAL_COMMUNITY)
Admission: AD | Admit: 2020-03-13 | Payer: Commercial Managed Care - PPO | Source: Home / Self Care | Admitting: Cardiovascular Disease

## 2020-03-13 ENCOUNTER — Encounter (HOSPITAL_BASED_OUTPATIENT_CLINIC_OR_DEPARTMENT_OTHER): Payer: Self-pay | Admitting: Cardiovascular Disease

## 2020-03-13 DIAGNOSIS — R002 Palpitations: Secondary | ICD-10-CM

## 2020-03-13 DIAGNOSIS — I472 Ventricular tachycardia, unspecified: Secondary | ICD-10-CM

## 2020-03-13 MED ORDER — AMIODARONE HCL 200 MG OR TABS
400.0000 mg | ORAL_TABLET | Freq: Every day | ORAL | 0 refills | Status: DC
Start: 2020-03-13 — End: 2020-03-22

## 2020-03-13 MED ORDER — METOPROLOL SUCCINATE ER 25 MG OR TB24
12.5000 mg | EXTENDED_RELEASE_TABLET | Freq: Every day | ORAL | 0 refills | Status: DC
Start: 2020-03-13 — End: 2020-03-22

## 2020-03-13 NOTE — Progress Notes (Signed)
holter monitor shows frequent PVCs and sustained VT.    Patient reports rare fluttering and dizziness    She is unfortunately in [place], [place].  Admission was arranged for today.    I am going to have her stop the carvedilol and switch to metoprolol XL 12.5mg  daily and start her on amiodarone 400mg  daily.n    If she has any dizziness, she will be going to the local ED ASAP.    I have asked her to cut her trip shorter.

## 2020-03-13 NOTE — Progress Notes (Signed)
The patient informs me that she is currently in [place], [place].    Her cell reception is poor.      She needs to start metoprolol XL 12.5mg  daily in lieu of carvedilol and also start amiodarone 400mg  daily.    She was instructed to let me know where to send her Rx and go to the ED with any fluttering or dizziness.

## 2020-03-17 ENCOUNTER — Encounter (HOSPITAL_BASED_OUTPATIENT_CLINIC_OR_DEPARTMENT_OTHER): Payer: Self-pay | Admitting: Cardiovascular Disease

## 2020-03-17 NOTE — Telephone Encounter (Signed)
Responded to patient with MyChart message

## 2020-03-18 ENCOUNTER — Inpatient Hospital Stay
Admission: AD | Admit: 2020-03-18 | Discharge: 2020-03-22 | DRG: 274 | Disposition: A | Discharge status: Home / Self Care | Payer: Commercial Managed Care - PPO | Attending: Cardiovascular Disease | Admitting: Cardiovascular Disease

## 2020-03-18 ENCOUNTER — Encounter (HOSPITAL_COMMUNITY): Payer: Self-pay | Admitting: Cardiovascular Disease

## 2020-03-18 ENCOUNTER — Encounter (HOSPITAL_COMMUNITY): Payer: Self-pay

## 2020-03-18 ENCOUNTER — Inpatient Hospital Stay (HOSPITAL_COMMUNITY): Payer: Self-pay | Admitting: Cardiovascular Disease

## 2020-03-18 ENCOUNTER — Telehealth (HOSPITAL_BASED_OUTPATIENT_CLINIC_OR_DEPARTMENT_OTHER): Payer: Self-pay | Admitting: Cardiovascular Disease

## 2020-03-18 ENCOUNTER — Other Ambulatory Visit: Payer: Self-pay

## 2020-03-18 DIAGNOSIS — Z87410 Personal history of cervical dysplasia: Secondary | ICD-10-CM

## 2020-03-18 DIAGNOSIS — C50919 Malignant neoplasm of unspecified site of unspecified female breast: Secondary | ICD-10-CM

## 2020-03-18 DIAGNOSIS — Z853 Personal history of malignant neoplasm of breast: Secondary | ICD-10-CM

## 2020-03-18 DIAGNOSIS — I4891 Unspecified atrial fibrillation: Secondary | ICD-10-CM

## 2020-03-18 DIAGNOSIS — I5020 Unspecified systolic (congestive) heart failure: Secondary | ICD-10-CM

## 2020-03-18 DIAGNOSIS — Z170 Estrogen receptor positive status [ER+]: Secondary | ICD-10-CM | POA: Diagnosis present

## 2020-03-18 DIAGNOSIS — Z79811 Long term (current) use of aromatase inhibitors: Secondary | ICD-10-CM

## 2020-03-18 DIAGNOSIS — R21 Rash and other nonspecific skin eruption: Secondary | ICD-10-CM | POA: Diagnosis not present

## 2020-03-18 DIAGNOSIS — C50412 Malignant neoplasm of upper-outer quadrant of left female breast: Secondary | ICD-10-CM | POA: Diagnosis present

## 2020-03-18 DIAGNOSIS — Z8041 Family history of malignant neoplasm of ovary: Secondary | ICD-10-CM

## 2020-03-18 DIAGNOSIS — Z885 Allergy status to narcotic agent status: Secondary | ICD-10-CM

## 2020-03-18 DIAGNOSIS — I472 Ventricular tachycardia, unspecified: Secondary | ICD-10-CM | POA: Diagnosis present

## 2020-03-18 DIAGNOSIS — I493 Ventricular premature depolarization: Principal | ICD-10-CM | POA: Diagnosis present

## 2020-03-18 DIAGNOSIS — I427 Cardiomyopathy due to drug and external agent: Secondary | ICD-10-CM

## 2020-03-18 DIAGNOSIS — Z808 Family history of malignant neoplasm of other organs or systems: Secondary | ICD-10-CM

## 2020-03-18 DIAGNOSIS — I5022 Chronic systolic (congestive) heart failure: Secondary | ICD-10-CM | POA: Diagnosis present

## 2020-03-18 DIAGNOSIS — F329 Major depressive disorder, single episode, unspecified: Secondary | ICD-10-CM | POA: Diagnosis present

## 2020-03-18 DIAGNOSIS — Z833 Family history of diabetes mellitus: Secondary | ICD-10-CM

## 2020-03-18 DIAGNOSIS — Z79899 Other long term (current) drug therapy: Secondary | ICD-10-CM

## 2020-03-18 DIAGNOSIS — T451X5A Adverse effect of antineoplastic and immunosuppressive drugs, initial encounter: Secondary | ICD-10-CM

## 2020-03-18 DIAGNOSIS — T451X5D Adverse effect of antineoplastic and immunosuppressive drugs, subsequent encounter: Secondary | ICD-10-CM

## 2020-03-18 DIAGNOSIS — F419 Anxiety disorder, unspecified: Secondary | ICD-10-CM | POA: Diagnosis present

## 2020-03-18 HISTORY — DX: Heart failure, unspecified: I50.9

## 2020-03-18 LAB — COMPREHENSIVE METABOLIC PANEL
ALT (GPT): 11 U/L (ref 7–33)
AST (GOT): 15 U/L (ref 9–38)
Albumin: 4.2 g/dL (ref 3.5–5.2)
Alkaline Phosphatase (Total): 36 U/L (ref 34–121)
Anion Gap: 7 (ref 4–12)
Bilirubin (Total): 0.5 mg/dL (ref 0.2–1.3)
Calcium: 9.6 mg/dL (ref 8.9–10.2)
Carbon Dioxide, Total: 29 meq/L (ref 22–32)
Chloride: 104 meq/L (ref 98–108)
Creatinine: 0.88 mg/dL (ref 0.38–1.02)
Glucose: 107 mg/dL (ref 62–125)
Potassium: 4 meq/L (ref 3.6–5.2)
Protein (Total): 6.7 g/dL (ref 6.0–8.2)
Sodium: 140 meq/L (ref 135–145)
Urea Nitrogen: 16 mg/dL (ref 8–21)
eGFR by CKD-EPI: >60 mL/min/{1.73_m2} (ref >59)

## 2020-03-18 LAB — CBC, DIFF
% Basophils: 1 %
% Eosinophils: 4 %
% Immature Granulocytes: 0 %
% Lymphocytes: 18 %
% Monocytes: 7 %
% Neutrophils: 70 %
% Nucleated RBC: 0 %
Absolute Eosinophil Count: 0.16 10*3/uL (ref 0.00–0.50)
Absolute Lymphocyte Count: 0.73 10*3/uL — ABNORMAL LOW (ref 1.00–4.80)
Basophils: 0.03 10*3/uL (ref 0.00–0.20)
Hematocrit: 35 % — ABNORMAL LOW (ref 36–45)
Hemoglobin: 12.1 g/dL (ref 11.5–15.5)
Immature Granulocytes: 0.01 10*3/uL (ref 0.00–0.05)
MCH: 31 pg (ref 27.3–33.6)
MCHC: 35 g/dL (ref 32.2–36.5)
MCV: 89 fL (ref 81–98)
Monocytes: 0.27 10*3/uL (ref 0.00–0.80)
Neutrophils: 2.85 10*3/uL (ref 1.80–7.00)
Nucleated RBC: 0 10*3/uL
Platelet Count: 185 10*3/uL (ref 150–400)
RBC: 3.9 10*6/uL (ref 3.80–5.00)
RDW-CV: 13.2 % (ref 11.6–14.4)
WBC: 4.05 10*3/uL — ABNORMAL LOW (ref 4.3–10.0)

## 2020-03-18 LAB — LAB ADD ON ORDER

## 2020-03-18 LAB — MAGNESIUM: Magnesium: 2 mg/dL (ref 1.8–2.4)

## 2020-03-18 LAB — TSH WITH REFLEXIVE FREE T4: TSH with Reflexive Free T4: 1.583 u[IU]/mL (ref 0.400–5.000)

## 2020-03-18 LAB — B_TYPE NATRIURETIC PEPTIDE: B_Type Natriuretic Peptide: 31 pg/mL (ref <101)

## 2020-03-18 MED ORDER — HEPARIN SODIUM (PORCINE) 5000 UNIT/ML IJ SOLN
5000.0000 [IU] | Freq: Three times a day (TID) | INTRAMUSCULAR | Status: DC
Start: 2020-03-18 — End: 2020-03-22
  Administered 2020-03-18 – 2020-03-21 (×9): 5000 [IU] via SUBCUTANEOUS
  Filled 2020-03-18 (×10): qty 1

## 2020-03-18 MED ORDER — SODIUM CHLORIDE 0.9 % IV SOLN
40.0000 meq | INTRAVENOUS | Status: DC | PRN
Start: 2020-03-18 — End: 2020-03-22

## 2020-03-18 MED ORDER — ONDANSETRON HCL 4 MG/2ML IJ SOLN
4.0000 mg | Freq: Three times a day (TID) | INTRAMUSCULAR | Status: DC | PRN
Start: 2020-03-18 — End: 2020-03-19

## 2020-03-18 MED ORDER — NITROGLYCERIN 0.4 MG SL SUBL
0.4000 mg | SUBLINGUAL_TABLET | SUBLINGUAL | Status: DC | PRN
Start: 2020-03-18 — End: 2020-03-19

## 2020-03-18 MED ORDER — ONDANSETRON HCL 4 MG OR TABS
4.0000 mg | ORAL_TABLET | Freq: Three times a day (TID) | ORAL | Status: DC | PRN
Start: 2020-03-18 — End: 2020-03-19

## 2020-03-18 MED ORDER — SODIUM CHLORIDE (PF) 0.9 % IJ SOLN
1.0000 mL | Freq: Once | INTRAVENOUS | Status: AC | PRN
Start: 2020-03-18 — End: 2020-03-21

## 2020-03-18 MED ORDER — AMIODARONE HCL 200 MG OR TABS
400.0000 mg | ORAL_TABLET | Freq: Every day | ORAL | Status: DC
Start: 2020-03-19 — End: 2020-03-19
  Administered 2020-03-19: 400 mg via ORAL
  Filled 2020-03-18: qty 2

## 2020-03-18 MED ORDER — VENLAFAXINE HCL ER 75 MG OR CP24
75.0000 mg | EXTENDED_RELEASE_CAPSULE | Freq: Every morning | ORAL | Status: DC
Start: 2020-03-19 — End: 2020-03-22
  Administered 2020-03-19 – 2020-03-22 (×4): 75 mg via ORAL
  Filled 2020-03-18 (×4): qty 1

## 2020-03-18 MED ORDER — POTASSIUM CHLORIDE CRYS ER 20 MEQ OR TBCR
20.0000 meq | EXTENDED_RELEASE_TABLET | ORAL | Status: DC | PRN
Start: 2020-03-18 — End: 2020-03-22
  Administered 2020-03-18 – 2020-03-22 (×3): 20 meq via ORAL
  Filled 2020-03-18 (×3): qty 1

## 2020-03-18 MED ORDER — POTASSIUM CHLORIDE CRYS ER 20 MEQ OR TBCR
40.0000 meq | EXTENDED_RELEASE_TABLET | ORAL | Status: DC | PRN
Start: 2020-03-18 — End: 2020-03-22

## 2020-03-18 MED ORDER — CETIRIZINE HCL 10 MG OR TABS
10.0000 mg | ORAL_TABLET | Freq: Every day | ORAL | Status: DC
Start: 2020-03-19 — End: 2020-03-22
  Administered 2020-03-19 – 2020-03-22 (×4): 10 mg via ORAL
  Filled 2020-03-18 (×4): qty 1

## 2020-03-18 MED ORDER — SODIUM CHLORIDE 0.9 % IV SOLN
20.0000 meq | INTRAVENOUS | Status: DC | PRN
Start: 2020-03-18 — End: 2020-03-22

## 2020-03-18 MED ORDER — MAGNESIUM SULFATE 2 GM/50ML SWFI IV SOLN
2.0000 g | INTRAVENOUS | Status: DC | PRN
Start: 2020-03-18 — End: 2020-03-22

## 2020-03-18 MED ORDER — LETROZOLE 2.5 MG OR TABS
2.5000 mg | ORAL_TABLET | Freq: Every day | ORAL | Status: DC
Start: 2020-03-19 — End: 2020-03-22
  Administered 2020-03-19 – 2020-03-22 (×4): 2.5 mg via ORAL
  Filled 2020-03-18 (×4): qty 1

## 2020-03-18 MED ORDER — ACETAMINOPHEN 500 MG OR TABS
1000.0000 mg | ORAL_TABLET | Freq: Three times a day (TID) | ORAL | Status: DC | PRN
Start: 2020-03-18 — End: 2020-03-22
  Administered 2020-03-22: 1000 mg via ORAL
  Filled 2020-03-18: qty 2

## 2020-03-18 MED ORDER — METOPROLOL SUCCINATE ER 12.5 MG SPLIT TABLET
12.5000 mg | Freq: Every day | ORAL | Status: DC
Start: 2020-03-19 — End: 2020-03-19
  Administered 2020-03-19: 12.5 mg via ORAL
  Filled 2020-03-18: qty 1

## 2020-03-18 MED ORDER — FAMOTIDINE 10 MG OR TABS
10.0000 mg | ORAL_TABLET | Freq: Every day | ORAL | Status: DC | PRN
Start: 2020-03-18 — End: 2020-03-22

## 2020-03-18 MED ORDER — SENNOSIDES 8.6 MG OR TABS
17.2000 mg | ORAL_TABLET | Freq: Two times a day (BID) | ORAL | Status: DC | PRN
Start: 2020-03-18 — End: 2020-03-22

## 2020-03-18 MED ORDER — MAGNESIUM SULFATE 1 G IN NS 52 ML IVPB (PKG PREMIX)
1.0000 g | INJECTION | Status: DC | PRN
Start: 2020-03-18 — End: 2020-03-22

## 2020-03-18 MED ORDER — LISINOPRIL 2.5 MG OR TABS
2.5000 mg | ORAL_TABLET | Freq: Two times a day (BID) | ORAL | Status: DC
Start: 2020-03-18 — End: 2020-03-22
  Administered 2020-03-18 – 2020-03-22 (×8): 2.5 mg via ORAL
  Filled 2020-03-18 (×8): qty 1

## 2020-03-18 MED ORDER — LIDOCAINE HCL (PF) 1 % IJ SOLN
1.0000 mL | Freq: Once | INTRAMUSCULAR | Status: AC
Start: 2020-03-18 — End: 2020-03-18
  Administered 2020-03-18: 1 mL via INTRADERMAL
  Filled 2020-03-18: qty 30

## 2020-03-18 MED ORDER — POLYETHYLENE GLYCOL 3350 17 G OR PACK
17.0000 g | PACK | Freq: Every day | ORAL | Status: DC | PRN
Start: 2020-03-18 — End: 2020-03-22

## 2020-03-18 NOTE — Telephone Encounter (Signed)
Per Dr [name],  I called patient to make sure she arrived home from her trip,.  She did.    I informed her that Flow Supervisor expects a bed to open up this afternoon, unsure of the timing.  Once confirmed that a bed is opening, patient will be called by the Flow Sup to inform her of her arrival time to Admissions.    Patient understood, will prepare for admission and wait for call with arrival time.

## 2020-03-18 NOTE — H&P (Signed)
History and Physical     Yolanda Bowers ("Yolanda Bowers") - DOB: 21-Apr-1971 (49 year old female)  Gender Identity: Female  Preferred Pronouns: she/her/hers  PCP: Yolanda Journey, MD   Code Status: Full Code     CHIEF CONCERN / IDENTIFICATION:  Yolanda Bowers is a 49 year old female with hx of high grade infiltrating ductal carcinoma of L breast (ER+/PR+, FISH+) with angiolymphatic invasion, s/p chem with ddAC-THP, left mastectomy with lymph node biopsy, and Herceptin and Perjeta with recently diagnosed with systolic heart failure, EF 45% admitted for 7 day CAM monitor showing high burden of PVCs and multiple runs of sustained VT.       SUBJECTIVE   HISTORY OF PRESENT ILLNESS:   Yolanda Bowers is a 49 year old female with hx of high grade infiltrating ductal carcinoma of L breast (ER+/PR+, FISH+) with angiolymphatic invasion, s/p chem with ddAC-THP, left mastectomy with lymph node biopsy, and Herceptin and Perjeta with recently diagnosed with systolic heart failure, EF 45%.  She was referred to Dr [name] in May and started on low dose GDMT with lisinopril and Metoprolol. She was then switched from Metoprolol to carvedilol at the end of June. At the beginning of July Yolanda Bowers noticed and increase in fatigue and when checking her pulse she was getting readings in the low 40s. Dr [name] sent the patient a Holter monitor to see if she was having frequent PVCs and decreased her Carvedilol dose. The 7 day Holter monitor results showed a high burden of  PVCs, but also several episodes of VT, one that was >30seconds meeting the criteria of sustained. At the time of the Holter results, the patient was in [place] on vacation and was not immediately available reachable. Once she was contacted, a prescription for amiodarone and Metoprolol were sent to [facility] for her to begin immediately (7/22). She was asked to return to [facility] and come to the hospital for admission.     On admission interview, Yolanda Yolanda Bowers  reports that she continues to have fatigue which somewhat limits her activity. She notes she is able to walk around [employer] slowly without stopping, but will be fatigued afterward. She also has some SOB with activity. She does report palpitations and occasional fleeting chest pain, but denies chest pressure or sustained chest pain. She has occasional dizziness, but denies syncope or pre-syncope. She notes that her palpitations, heart rate, blood pressure and activity tolerance have all improved since starting amiodarone.       Review of Systems  I have performed a complete review of systems with the patient, which is negative except as indicated per HPI.        HISTORY   Problem List   Diagnosis   . Macromastia   . Malignant neoplasm of upper-outer quadrant of left breast in female, estrogen receptor positive ([place])   . CIN I (cervical intraepithelial neoplasia I)   . Chemotherapy induced cardiomyopathy ([place])   . PVC's (premature ventricular contractions)   . Ventricular tachyarrhythmia [facility])       Past Surgical History:   Procedure Laterality Date   . BREAST BIOPSY Right 11/2018    2 core bx     . BREAST RECONSTRUCTION Right 06/25/2019    reduction - Dr. Lowella Dell   . PR MASTECTOMY SIMPLE COMPLETE Left 06/25/2019    Had radiation       Social History     Tobacco Use   . Smoking status: Never Smoker   .  Smokeless tobacco: Never Used   Substance and Sexual Activity   . Alcohol use: Not Currently   . Drug use: Not Currently   . Sexual activity: Not on file   Social History Narrative    Lives in [place] with her husband and two children, ages 53 and 25. She works as an Metallurgist.        Family History   Problem Relation Age of Onset   . Alcohol/Drug Maternal Grandfather    . Diabetes Mother    . Hearing Loss Father      Mother    . Obesity Maternal Grandfather      Mother    . Ovarian Cancer Paternal Aunt 69   . Skin Cancer Maternal Grandfather      Mother           OUTPATIENT MEDICATIONS:   Current Outpatient  Medications   Medication Instructions   . acetaminophen 500 MG tablet Take 1-2 tabs by mouth once daily as needed for pain.   Marland Kitchen amiodarone (PACERONE) 400 mg, Oral, Daily   . Calcium Citrate-Vitamin D (CITRACAL + D OR) 1 tablet, Oral, Daily   . cetirizine (ZYRTEC) 10 mg, Oral, Daily   . famotidine 20 MG tablet Take 1 tablet by mouth 2 times a day   . letrozole (FEMARA) 2.5 mg, Oral, Daily   . lisinopril (PRINIVIL; ZESTRIL) 2.5 mg, Oral, 2 times daily   . metoprolol succinate ER (TOPROL XL) 12.5 mg, Oral, Daily, Do not chew or crush.   . venlafaxine ER (EFFEXOR XR) 75 mg, Oral, Every morning       ALLERGIES:   Oxycodone, Codeine, Walnut, and Paclitaxel      OBJECTIVE       Vitals (Arrival)      T: 36.9 C (03/18/20 1418)  BP: 109/70 (03/18/20 1418)  HR: 78 (03/18/20 1418)  RR: 16 (03/18/20 1418)  SpO2: 96 % (03/18/20 1418) Room air   Vitals (Most recent in last 24 hrs)   T: 36.5 C (03/18/20 1725)  BP: (!) 102/39 (03/18/20 1725)  HR: 71 (03/18/20 1725)  RR: 16 (03/18/20 1725)  SpO2: 95 % (03/18/20 1725) Room air  T range: Temp  Min: 36.5 C  Max: 36.9 C  Wt 148 lb 13 oz (67.5 kg)     Ht 5' 5.748" (1.67 m)     Body mass index is 24.2 kg/m.       Physical Exam  General: Alert and oriented female, pleasant, lying in the bed in NAD  HEENT: AT/NC, PERRL, oropharynx moist without redness or exudate, neck supple, JVD 6  CV: S1, S2, regular rhythm, no m/r/g  Pulm: Lungs CTA, no crackles or wheezes  Abd: Soft, non-tender to palpation, +BT  Upper extremities: Pink, warm, dry, capillary refill < 3 sec, + radial pulses bil., no edema  Lower extremities: Pink, warm, dry, capillary refill < 3 sec, + DP pulses bil., no edema  Neuro: Grossly intact  Skin: No rashes, no ecchymosis, no ulcerations      Labs (last 24 hours):   Chemistries  CBC  LFT  Gases, other   Cancel. Reorder as RN collect. Cancel. Reorder as RN collect. Cancel. Reorder as RN collect. Cancel. Reorder as RN collect.   Cancel. Reorder as RN collect.   AST: Cancel.  Reorder as RN collect. ALT: Cancel. Reorder as RN collect.  -/-/-/-  -/-/-/-   Cancel. Reorder as RN collect. Cancel. Reorder as RN collect. Cancel. Reorder as Therapist, sports  collect.   Cancel. Reorder as RN collect. >< Cancel. Reorder as RN collect.  AP: Cancel. Reorder as RN collect. T bili: Cancel. Reorder as RN collect.  Lact (a): - Lact (v): -   eGFR: Cancel. Reorder as RN collect. Ca: Cancel. Reorder as RN collect.   Cancel. Reorder as RN collect.   Prot: Cancel. Reorder as RN collect. Alb: Cancel. Reorder as RN collect.  Trop I: - D-dimer: -   Mg: Cancel. Reorder as RN collect. PO4: -  ANC: Cancel. Reorder as RN collect.     BNP: Cancel. Reorder as RN collect. Anti-Xa: -     ALC: Cancel. Reorder as RN collect.    INR: Cancel. Reorder as RN collect.            ASSESSMENT/PLAN     Daun Vernita Tague is a 49 year old female with hx of high grade infiltrating ductal carcinoma of L breast (ER+/PR+, FISH+) with angiolymphatic invasion, s/p chem with ddAC-THP, left mastectomy with lymph node biopsy, and Herceptin and Perjeta with recently diagnosed with systolic heart failure, EF 45% admitted for 7 day CAM monitor showing high burden of PVCs and multiple runs of sustained VT.     PVCs  VT  7 day CAM monitor showed high burden of PVCs and several runs of VT, some sustained >30 seconds. The unperfused PVCs might explain her "intermittently low HR" and may be contributing to her fatigue and palpitations. As she has new heart failure and EF 45%, does not have device. Patient notes symptoms to be improved since initiation of amiodarone.     Plan  - Appreciate EP consulting  - Continue amiodarone '400mg'$  daily  - Continue metoprolol 12.'5mg'$  daily  - Cardiac MRI to look for scar as etiology of ectopy    Chemo induced heart failure  Systolic heart failure  New diagnosis ~May 21. EF in May was 45%. Unclear if her heart failure is reversible (Herceptin) or irreversible (Adriamycin). Has not had other work up for causes of heart failure  that are readily available in the chart.     Plan  - Repeat echo to assess function  - Continue lisinopril 2.'5mg'$  Q12hours  - Continue Metoprolol as above  - Consider spironolactone???    Hx cT3N3 left breast invasive ductal carcinoma, ER/PR/HER2+  S/p neoadjuvant chemo with ddAC-THP, left mastectomy and SLN biopsy, and adjuvant radiation therapy. Herceptin and perjeta were stopped when she was found to have low EF.     Plan  - Continue Letrozole 2.'5mg'$  daily    Anxiety and Depression  2/2 to breast cancer and complex medical needs. Seen by [facility] Psych and started on Venlafaxine with some improvement.     Plan  - Continue Venlafaxine '75mg'$  daily      Inpatient Checklist:    Prophylaxis: sc heparin  Lines/Drains/Airways: Port a cath  Disposition: Admit to Cardiology B floor care  Code Status: Full Code  Contacts: Primary Emergency Contact: Caesar Chestnut, Home Phone: [phone]

## 2020-03-18 NOTE — Telephone Encounter (Signed)
Follow up appointment

## 2020-03-18 NOTE — Nursing Note (Signed)
Patient Summary  Hx of breast cancer and lymph node removal.  Admitted to unit around 1500 from home for arrhythmia.  Pt showing frequent ventricular bigemeny on tele.    Edited by: Donia Ast, RN at 03/18/2020 1742    Illness Severity  Stable  Edited by: Donia Ast, RN at 03/18/2020 [phone]

## 2020-03-18 NOTE — Consults (Signed)
Consult Note     Yolanda Bowers ("Lior") - DOB: [birth year] (49 year old female)  Gender Identity: Female  Preferred Pronouns: she/her/hers  Admit Date: 03/18/2020  Code Status: Full Code     Consults    CHIEF CONCERN / IDENTIFICATION:  Yolanda Bowers is a 49 year old female with history of high-grade infiltrating ductal carcinoma of the left breast (ER+/PR+, FISH +) with angiolymphatic invasion, history of neoadjuvant chemotherapywith ddAC-THP,left mastectomy with lymph node biopsy, receivingHerceptin and Perjeta, HFrEF (EF 45%) who is presenting for VT found an outpatient CAM monitor.    Electrophysiology consulted for management of VT as well as consideration of ICD.     SUBJECTIVE   HISTORY OF PRESENT ILLNESS:   Patient is a 49 year old woman with history of breast cancer status post chemotherapy with anthracyclines, left mastectomy, currently on Herceptin and Perjeta who is presenting for VT found on an outpatient CAM monitor.  The patient has been receiving routine echocardiograms, which recently showed a decrement in the last several months.  Previously, in 2020 her echocardiogram showed an LVEF of 60%.  Most recently, her ejection fraction has dropped to 45%.  Her outpatient cardiologist ordered an event monitor because she was having some palpitations as well as bradycardia seen on an outpatient visits.  EKGs showed frequent PVCs.  She had a 7-day CAM monitor done, which showed several episodes of cyst pain for VT, frequent NSVT, and 20% PVC burden.  The patient triggered multiple events on her monitor, noting symptoms of chest fluttering and palpitations, but did not have any chest pain or syncope.  Her outpatient cardiologist then called the patient when she received these results and advised that the patient present to the emergency department.  However, the patient was vacationing in [place] and could not present to the emergency department right away.  She was started on amiodarone and  has taken 2-3 doses prior to admission.    Patient does note some intermittent left-sided chest pain at the site of her mastectomy and radiation.  This feels like a sharp pain internally.  This is not reproducible with exertion or palpation.  She denies radiation to her shoulder, neck, back.  She denies shortness of breath and lower extremity edema.      Review of Systems  I have performed a complete review of systems with the patient, which is negative except as indicated per HPI.        HISTORY   Problem List   Diagnosis   . Macromastia   . Malignant neoplasm of upper-outer quadrant of left breast in female, estrogen receptor positive ([place])   . CIN I (cervical intraepithelial neoplasia I)   . Chemotherapy induced cardiomyopathy ([place])   . PVC's (premature ventricular contractions)   . Ventricular tachyarrhythmia [facility])       Past Surgical History:   Procedure Laterality Date   . BREAST BIOPSY Right 11/2018    2 core bx     . BREAST RECONSTRUCTION Right 06/25/2019    reduction - Dr. Lowella Dell   . PR MASTECTOMY SIMPLE COMPLETE Left 06/25/2019    Had radiation       Social History     Tobacco Use   . Smoking status: Never Smoker   . Smokeless tobacco: Never Used   Substance and Sexual Activity   . Alcohol use: Not Currently   . Drug use: Not Currently   . Sexual activity: Not on file   Social History Narrative  Lives in [place] with her husband and two children, ages 43 and 56. She works as an Metallurgist.        Family History   Problem Relation Age of Onset   . Alcohol/Drug Maternal Grandfather    . Diabetes Mother    . Hearing Loss Father      Mother    . Obesity Maternal Grandfather      Mother    . Ovarian Cancer Paternal Aunt 45   . Skin Cancer Maternal Grandfather      Mother           MEDICATIONS:   Current Outpatient Medications   Medication Instructions   . acetaminophen (TYLENOL) 500 mg, Oral, 2 times daily PRN   . amiodarone (PACERONE) 400 mg, Oral, Daily   . cetirizine (ZYRTEC) 10 mg, Oral, Daily   .  famotidine 20 MG tablet Take 1 tablet by mouth 2 times a day   . letrozole (FEMARA) 2.5 mg, Oral, Daily   . lisinopril (PRINIVIL; ZESTRIL) 2.5 mg, Oral, 2 times daily   . loperamide (IMODIUM) 2 mg, Oral, Daily PRN   . metoprolol succinate ER (TOPROL XL) 12.5 mg, Oral, Daily, Do not chew or crush.   . venlafaxine ER (EFFEXOR XR) 75 mg, Oral, Every morning       ALLERGIES:   Oxycodone, Codeine, [place], and Paclitaxel     OBJECTIVE       Vitals (Arrival)      T: 36.9 C (03/18/20 1418)  BP: 109/70 (03/18/20 1418)  HR: 78 (03/18/20 1418)  RR: 16 (03/18/20 1418)  SpO2: 96 % (03/18/20 1418) Room air   Vitals (Most recent in last 24 hrs)   T: 36.9 C (03/18/20 1418)  BP: 109/70 (03/18/20 1418)  HR: 78 (03/18/20 1418)  RR: 16 (03/18/20 1418)  SpO2: 96 % (03/18/20 1418) Room air  T range: Temp  Min: 36.9 C  Max: 36.9 C  (no weight taken for this visit)     (no height taken for this visit)     There is no height or weight on file to calculate BMI.       Physical Exam  Constitutional:       General: She is not in acute distress.     Appearance: Normal appearance. She is not ill-appearing, toxic-appearing or diaphoretic.   HENT:      Head: Normocephalic and atraumatic.   Eyes:      Extraocular Movements: Extraocular movements intact.   Neck:      Musculoskeletal: Normal range of motion.   Cardiovascular:      Rate and Rhythm: Normal rate. Rhythm irregular.      Pulses: Normal pulses.      Heart sounds: Normal heart sounds. No murmur. No friction rub. No gallop.       Comments: No JVD  Pulmonary:      Effort: Pulmonary effort is normal. No respiratory distress.      Breath sounds: Normal breath sounds. No wheezing or rales.   Abdominal:      General: Abdomen is flat. There is no distension.      Palpations: Abdomen is soft.      Tenderness: There is no abdominal tenderness.   Musculoskeletal:      Right lower leg: No edema.      Left lower leg: No edema.   Skin:     General: Skin is warm and dry.      Capillary Refill:  Capillary refill  takes less than 2 seconds.      Coloration: Skin is not jaundiced.   Neurological:      General: No focal deficit present.      Mental Status: She is alert and oriented to person, place, and time.   Psychiatric:         Mood and Affect: Mood normal.         Behavior: Behavior normal.           Labs (last 24 hours):   Chemistries  CBC  LFT  Gases, other   - - - -   -   AST: - ALT: -  -/-/-/-  -/-/-/-   - - -   - >< -  AP: - T bili: -  Lact (a): - Lact (v): -   eGFR: - Ca: -   -   Prot: - Alb: -  Trop I: - D-dimer: -   Mg: - PO4: -  ANC: -     BNP: - Anti-Xa: -     ALC: -    INR: -        TTE 02/18/20  Conclusion  Normal left ventricular chamber size with normal wall thickness.  Mildly  decreased systolic function with EF 45% and no regional wall motion  abnormalities.  Global longitudinal strain mildly decreased, -16%.       Right ventricular size and function are normal. Catheter in the right heart.    No pericardial effusion.     On side by side comparison, global LV function is comparable (previous EF  51%), b ut is decreased compared to the 2020 studies, when EF was 60%.    7 day CAM 02/28/20  . Predominant rhythm: NSR  . Atrial Tachycardia (AT) 9 episodes, Longest 16 beats @ Avg 138 bpm up to  209 bpm, Fastest 4 beats @ Avg 151 bpm up to 163 bpm  . Ventricular Tachycardia (VT) 36 episodes, Longest 48.6 s @ Avg 150 bpm  up to 198 bpm, Fastest 4 beats @ Avg 152 bpm up to 186 bpm  . Accelerated Idioventricular Rhythm (AIVR)  . Ectopic Atrial Rhythm (EAR)  . PAC 0.06 %  . PVC 21.29 %  ATTENDING STATEMENT  I agree with report above.  1. High PVC burden.  2. Several episodes of likely ventricular tachycardia, in strips 26 and later. With  one episode there's a PAC at the start, which is why I cannot 100% rule out  SVT with aberrancy, but other times with PVCs there's a P wave in the ST  segment, meaning I think the morphology and rhythm is more consistent with  VT than SVT. In Strip 32, I think  there's VA dissociation which also favors VT.  Longest VT is >30 seconds so meets definition of sustained.                    ASSESSMENT/PLAN       Monomorphic ventricular tachycardya  On ambulatory ECG monitoring, patient had a heavy burden of NSVT as well as sustained monomorphic VT.  These appear to be PVC induced. Positive QRS in the inferior leads indicate that this is an outflow tract PVC/VT, which are generally considered benign and may not need an ICD. It is unclear whether this is RVOT vs LVOT as the ECG does not show the PVCs in the precordial leads, so we would recommend trying to capture this in a precordial lead. Outflow tract PVCs are calcium channel  blocker sensitive, but are contra-indicated with her HFrEF. CAM shows a 20% PVC burden, which may explain her recent decrement in her ejection fraction.  It seems plausible that her prior chemotherapy as well as radiation may have caused myocardial scarring, leading to her PVCs and VT. With her PVC burden, she may benefit from an EP ablation therapy to further mitigate any PVC induced cardiomyopathy and VT.  - Obtain cardiac MRI  - Please obtain a 12 lead ECG with a PVC captured on the precordial leads  - Defer ICD implantation for now  - Start amiodarone 445m BID  -Will discuss VT ablation/timing    The plan of care was discussed with attending cardiologist Dr. AMadelynn Done who has examined the patient is in agreement with the plan. Please see their attestation for further details/clarifications.    Thank you for involving [place] this patient's care. Cardiology EP consult will continue to follow.  Please contact [place] you have any questions or concerns.

## 2020-03-19 ENCOUNTER — Other Ambulatory Visit: Payer: Self-pay

## 2020-03-19 ENCOUNTER — Inpatient Hospital Stay (HOSPITAL_COMMUNITY): Payer: Commercial Managed Care - PPO

## 2020-03-19 DIAGNOSIS — R9431 Abnormal electrocardiogram [ECG] [EKG]: Secondary | ICD-10-CM

## 2020-03-19 DIAGNOSIS — I429 Cardiomyopathy, unspecified: Secondary | ICD-10-CM

## 2020-03-19 DIAGNOSIS — I427 Cardiomyopathy due to drug and external agent: Secondary | ICD-10-CM

## 2020-03-19 LAB — BASIC METABOLIC PANEL
Anion Gap: 7 (ref 4–12)
Calcium: 9.9 mg/dL (ref 8.9–10.2)
Carbon Dioxide, Total: 27 meq/L (ref 22–32)
Chloride: 106 meq/L (ref 98–108)
Creatinine: 0.85 mg/dL (ref 0.38–1.02)
Glucose: 86 mg/dL (ref 62–125)
Potassium: 4 meq/L (ref 3.6–5.2)
Sodium: 140 meq/L (ref 135–145)
Urea Nitrogen: 16 mg/dL (ref 8–21)
eGFR by CKD-EPI: >60 mL/min/{1.73_m2} (ref >59)

## 2020-03-19 LAB — CBC (HEMOGRAM)
Hematocrit: 35 % — ABNORMAL LOW (ref 36–45)
Hemoglobin: 12 g/dL (ref 11.5–15.5)
MCH: 30.7 pg (ref 27.3–33.6)
MCHC: 34.7 g/dL (ref 32.2–36.5)
MCV: 89 fL (ref 81–98)
Platelet Count: 156 10*3/uL (ref 150–400)
RBC: 3.91 10*6/uL (ref 3.80–5.00)
RDW-CV: 13.2 % (ref 11.6–14.4)
WBC: 2.54 10*3/uL — ABNORMAL LOW (ref 4.3–10.0)

## 2020-03-19 LAB — COVID-19 CORONAVIRUS QUALITATIVE PCR: COVID-19 Coronavirus Qual PCR Result: NOT DETECTED

## 2020-03-19 LAB — MAGNESIUM: Magnesium: 2.1 mg/dL (ref 1.8–2.4)

## 2020-03-19 LAB — TRANSTHORACIC ECHO (TTE) LIMITED
IVSd: 0.63 cm
LV Systolic Volume (BP): 51.1 ml
LVIDd: 5.6 cm
LVIDs: 4.3 cm
LVPWd: 0.64 cm

## 2020-03-19 LAB — POTASSIUM, SERUM: Potassium: 4.4 meq/L (ref 3.6–5.2)

## 2020-03-19 MED ORDER — AMIODARONE HCL 200 MG OR TABS
400.0000 mg | ORAL_TABLET | Freq: Two times a day (BID) | ORAL | Status: DC
Start: 2020-03-19 — End: 2020-03-20
  Administered 2020-03-19 – 2020-03-20 (×2): 400 mg via ORAL
  Filled 2020-03-19 (×2): qty 2

## 2020-03-19 MED ORDER — METOPROLOL SUCCINATE ER 12.5 MG SPLIT TABLET
12.5000 mg | Freq: Two times a day (BID) | ORAL | Status: DC
Start: 2020-03-19 — End: 2020-03-20
  Administered 2020-03-19 – 2020-03-20 (×2): 12.5 mg via ORAL
  Filled 2020-03-19 (×2): qty 1

## 2020-03-19 NOTE — H&P (Signed)
Moderate Sedation Pre-Procedure Assessment/H&P  Yolanda Bowers is a 49 year old female with  history of high-grade infiltrating ductal carcinoma of the left breast with angiolymphatic invasion, history of neoadjuvant chemotherapy and presumed chemo-induced CM ( HFrEF (EF 45%)) who presents with fatigue, palpitations and VT found an outpatient CAM monitor.    She proceeds to the cath-lab for coronary angiography to rule out obstructive CAD as a cause for her cardiomyopathy and/or VT.    Vitals:     Vitals (Most recent in last 24 hrs)     T: 36.5 C (03/19/20 1249)  BP: 116/64 (03/19/20 1249)  HR: 68 (03/19/20 1249)  RR: 16 (03/19/20 1249)  SpO2: 98 % (03/19/20 1249) Room air  T range: Temp  Min: 35.7 C  Max: 36.9 C  Admit weight: 67.5 kg (148 lb 13 oz) (03/18/20 1500)  Last weight: 67.6 kg (149 lb 0.5 oz) (03/19/20 0546)     Procedure  No admission procedures for hospital encounter.    Sedation RN Data     Patient Language English   @[email or web address] @  Home Medications   Prescriptions   Calcium Citrate-Vitamin D (CITRACAL + D OR)   Sig: Take 1 tablet by mouth daily.   acetaminophen 500 MG tablet   Sig: Take 1-2 tabs by mouth once daily as needed for pain.   amiodarone 200 MG tablet   Sig: Take 2 tablets (400 mg) by mouth daily.   cetirizine 10 MG tablet   Sig: Take 10 mg by mouth daily.   famotidine 20 MG tablet   Sig: Take 1 tablet by mouth 2 times a day   Patient taking differently: Take 20 mg by mouth daily as needed.    letrozole 2.5 MG tablet   Sig: Take 1 tablet (2.5 mg) by mouth daily.   lisinopril 2.5 MG tablet   Sig: Take 1 tablet (2.5 mg) by mouth 2 times a day.   metoprolol succinate ER 25 MG 24 hr tablet   Sig: Take 0.5 tablets (12.5 mg) by mouth daily. Do not chew or crush.   venlafaxine ER 75 MG 24 hr capsule   Sig: Take 1 capsule (75 mg) by mouth every morning.      Additional Comments: None       Inpatient Medications   SCHEDULED MEDICATIONS:   cetirizine, 10 mg, Daily  .  heparin, 5,000 Units,  q8h [facility]  .  letrozole, 2.5 mg, Daily  .  lisinopril, 2.5 mg, BID  .  metoprolol succinate ER, 12.5 mg, BID  .  venlafaxine ER, 75 mg, q AM    INFUSED MEDICATIONS:       PRN MEDICATIONS:  .  acetaminophen, 1,000 mg, q8h PRN  .  famotidine, 10 mg, Daily PRN  .  magnesium sulfate, 1 g, PRN  .  magnesium sulfate, 2 g, PRN  .  nitroGLYCERIN, 0.4 mg, q5 min PRN  .  ondansetron, 4 mg, q8h PRN  .  ondansetron, 4 mg, q8h PRN  .  perflutren lipid microsphere (Definity) in NS injection, 1-10 mL, Once PRN  .  polyethylene glycol 3350, 17 g, Daily PRN  .  potassium chloride, 20 mEq, PRN  .  potassium chloride, 40 mEq, PRN  .  potassium chloride ER, 20 mEq, PRN  .  potassium chloride ER, 40 mEq, PRN  .  senna, 17.2 mg, BID PRN       Anticoagulants   Is patient on anticoagulation meds?: (not recorded)  Anticoagulation labs  and last medication admin verified?: (not recorded)     OB/GYN   Patient Pregnant: No   Lactation Status: No     NPO Status   Date of Last Liquid: (not recorded)  Time of Last Liquid: (not recorded)  Date of Last Solid: (not recorded)  Time of Last Solid: (not recorded)     Activity Tolerance   Assessment of climbing up 2 flights of stairs: (not recorded)  Assessment of walking 2 blocks: (not recorded)     Escort/Ride Home Info   Escort/Ride home contact name: (not recorded)  Escort/Ride home contact relationship: (not recorded)  Escort/Ride home contact number(s): (not recorded)  Escort/Ride home contact location: (not recorded)     Sedation Provider Statement  I concur with the RN's pre-procedure assessment above.    Physical Exam:    Airway  Mallampati::  II  TM distance: change to >6cm:  >6 cm  Neck ROM::  Full  Mouth Opening::  Normal  Facial Hair::  None    Cardiovascular  Rhythm::  Regular  Rate::  Normal   no murmur   carotid bruit is not present   no JVD   no peripheral edema   no weak pulses    Dental  Normal exam      Pulmonary  Normal exam    Breath sounds clear to auscultation      Neurological    alert   oriented to person, place, and time    Other Findings      Risk Factors  Status: Low:  ASA II.  Patient with mild systemic disease    Assessment: Okay for sedation    Plan for Sedation: Moderate sedation.    Attestation  I am an [employer] fellow or resident. I attest to having certification for moderate sedation (and having had direct supervision for the required number of procedures under moderate sedation). I'm now being indirectly supervised by C. Chung [Attending of record] who has privileges for moderate sedation.    Assessment Update  Procedure less than 1 hour from above assessment: No changes    Labs from today reviewed  Cr 0.7  hgb 12  plt 176  inr 1.0        [facility] Fellow

## 2020-03-19 NOTE — Progress Notes (Signed)
Progress Note     Yolanda Bowers ("Yolanda Bowers") - DOB: 26-Nov-1970 (49 year old female)  Gender Identity: Female  Preferred Pronouns: she/her/hers  Admit Date: 03/18/2020  Code Status: Full Code     CHIEF CONCERN / IDENTIFICATION:  Yolanda Bowers is a 49 year old female with history of high-grade infiltrating ductal carcinoma of the left breast (ER+/PR+, FISH +) with angiolymphatic invasion, history of neoadjuvant chemotherapywith ddAC-THP,left mastectomy with lymph node biopsy, receivingHerceptin and Perjeta, HFrEF (EF 45%) who is presenting for VT found an outpatient CAM monitor.     SUBJECTIVE   INTERVAL HISTORY:  No acute events. Her PVC burden has decreased since admission. She feels fatigued this morning, but did not have optimal sleep overnight.     SCHEDULED MEDICATIONS:   amiodarone, 400 mg, Daily  .  cetirizine, 10 mg, Daily  .  heparin, 5,000 Units, q8h [facility]  .  letrozole, 2.5 mg, Daily  .  lisinopril, 2.5 mg, BID  .  metoprolol succinate ER, 12.5 mg, BID  .  venlafaxine ER, 75 mg, q AM    INFUSED MEDICATIONS:       PRN MEDICATIONS:  .  acetaminophen, 1,000 mg, q8h PRN  .  famotidine, 10 mg, Daily PRN  .  magnesium sulfate, 1 g, PRN  .  magnesium sulfate, 2 g, PRN  .  nitroGLYCERIN, 0.4 mg, q5 min PRN  .  ondansetron, 4 mg, q8h PRN  .  ondansetron, 4 mg, q8h PRN  .  perflutren lipid microsphere (Definity) in NS injection, 1-10 mL, Once PRN  .  polyethylene glycol 3350, 17 g, Daily PRN  .  potassium chloride, 20 mEq, PRN  .  potassium chloride, 40 mEq, PRN  .  potassium chloride ER, 20 mEq, PRN  .  potassium chloride ER, 40 mEq, PRN  .  senna, 17.2 mg, BID PRN       OBJECTIVE       Vitals (Most recent in last 24 hrs)     T: 36.9 C (03/19/20 0804)  BP: 115/51 (03/19/20 0804)  HR: 78 (03/19/20 0804)  RR: 16 (03/19/20 0804)  SpO2: 97 % (03/19/20 0804) Room air  T range: Temp  Min: 35.7 C  Max: 36.9 C  Admit weight: 67.5 kg (148 lb 13 oz) (03/18/20 1500)  Last weight: 67.6 kg (149 lb 0.5 oz)  (03/19/20 0546)       I&Os:     Intake/Output Summary (Last 24 hours) at 03/19/2020 1149  Last data filed at 03/19/2020 1031  Intake -   Output 2250 ml   Net -2250 ml     ROS  A comprehensive ROS was completed and is negative except for items mentioned in the remainder of the note.     Physical Exam  General: Alert and oriented female, pleasant, lying in the bed in NAD  HEENT: AT/NC, PERRL, oropharynx moist without redness or exudate, neck supple, JVD 5  CV: S1, S2, regular rhythm, no m/r/g  Pulm: Lungs CTA, no crackles or wheezes  Abd: Soft, non-tender to palpation, +BT  Upper extremities: Pink, warm, dry, capillary refill < 3 sec, + radial pulses bil., no edema  Lower extremities: Pink, warm, dry, capillary refill < 3 sec, + DP pulses bil., no edema  Neuro: Grossly intact  Skin: No rashes, no ecchymosis, no ulcerations      Labs (last 24 hours):   Chemistries  CBC  LFT  Gases, other   140 106 16 86  12.0   AST: 15 ALT: 11  -/-/-/-  -/-/-/-   4.0 27 0.85   2.54 >< 156  AP: 36 T bili: 0.5  Lact (a): - Lact (v): -   eGFR: >60 Ca: 9.9   35   Prot: 6.7 Alb: 4.2  Trop I: - D-dimer: -   Mg: 2.1 PO4: -  ANC: 2.85     BNP: 31 Anti-Xa: -     ALC: 0.73    INR: -            ASSESSMENT/PLAN     Yolanda Bowers is a 49 year old female with history of high-grade infiltrating ductal carcinoma of the left breast (ER+/PR+, FISH +) with angiolymphatic invasion, history of neoadjuvant chemotherapywith ddAC-THP,left mastectomy with lymph node biopsy, receivingHerceptin and Perjeta, HFrEF (EF 45%) who is presenting for VT found an outpatient CAM monitor.    PVCs  VT  7 day CAM monitor showed high burden of PVCs and several runs of VT, some sustained >30 seconds. The unperfused PVCs might explain her hx "intermittently low HR" and may be contributing to her fatigue and possibly contributing to her heart failure. As she has new heart failure and EF 45%, does not have device. Although it is assumed that her heart failure is chemo  induced, but she has not had work up for other possible etiologies. If she has and ischemic component to her heart failure, this might be a cause for her PVCs and VT. Patient notes symptoms to be improved since initiation of amiodarone. Appreciate EP consulting. PVC burden improved since admission.     Plan  - Stop amiodarone per EP request in hopes of possible ablation and need to induce PVC/VT  - Increase metoprolol 12.31m BID  - Cardiac MRI to look for scar as etiology of ectopy and possible alternate etiology for heart failure    Chemo induced heart failure  Systolic heart failure  New diagnosis ~May 2021. EF in May was 45%. 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 Unclear if her heart failure is reversible (Herceptin) or irreversible (Adriamycin).     Plan  - Repeat echo to assess function  - LHC Thursday to rule out ischemic etiology for heart failure/PVCs and VT  - Continue lisinopril 2.542mQ12hours  - Continue Metoprolol as above    Hx cT3N3 left breast invasive ductal carcinoma, ER/PR/HER2+  S/p neoadjuvant chemo with ddAC-THP, left mastectomy and SLN biopsy, and adjuvant radiation therapy. Herceptin and perjeta were stopped when she was found to have low EF.     Plan  - Continue Letrozole 2.33m833maily  - Continue follow up with [facility]    Anxiety and Depression  2/2 to breast cancer and complex medical needs. Seen by [facility] Psych and started on Venlafaxine with some improvement. Her mood seems appropriate here. Seems to have high medical literacy and appreciates careful discussion of disease and potential interventions.    Plan  - Continue Venlafaxine 733m30mily    Inpatient Checklist:    Prophylaxis: sc heparin  Lines/Drains/Airways: Port a cath  Disposition: Continue Cardiology B floor care  Code Status: Full Code  Contacts: Primary Emergency Contact: RobeCaesar Chestnutme Phone: [phone]

## 2020-03-19 NOTE — Nursing Note (Signed)
Patient Summary  Hx of breast cancer and lymph node removal. Admitted for high PVC burden and runs of VT seen on 7 day CAM monitor.     SR, HR averaging 60's, frequent PVCs and occasional bigeminy. Denied chest pain and SOB. Plan for echo and MRI today. EP to consult.       Illness Severity  Stable

## 2020-03-19 NOTE — Nursing Note (Signed)
Patient Summary  Hx of breast cancer and lymph node removal. Admitted for ventricular arrhythmia.      SR 60-80s, frequent PVCs and occasional bigeminy. Denies chest pain and SOB. Echo completed today.  Increased metropolol.  NPO at [facility] for [facility] tomorrow  Edited by: Donia Ast, RN at 03/19/2020 1633    Illness Severity  Stable  Edited by: Donia Ast, RN at 03/18/2020 [phone]

## 2020-03-19 NOTE — Progress Notes (Signed)
Progress Note     Yolanda Bowers ("[name]") - DOB: December 03, 1970 (49 year old female)  Gender Identity: Female  Preferred Pronouns: she/her/hers  Admit Date: 03/18/2020  Code Status: Full Code     CHIEF CONCERN / IDENTIFICATION:  Yolanda Bowers is a 49 year old female with  history of high-grade infiltrating ductal carcinoma of the left breast with angiolymphatic invasion, history of neoadjuvant chemotherapy and presumed chemo-induced CM ( HFrEF (EF 45%)) who presents with fatigue, palpitations and VT found an outpatient CAM monitor.     SUBJECTIVE   INTERVAL HISTORY:    She endorses palpitations since 12/2019.    SCHEDULED MEDICATIONS:   cetirizine, 10 mg, Daily  .  heparin, 5,000 Units, q8h [facility]  .  letrozole, 2.5 mg, Daily  .  lisinopril, 2.5 mg, BID  .  metoprolol succinate ER, 12.5 mg, BID  .  venlafaxine ER, 75 mg, q AM    INFUSED MEDICATIONS:            OBJECTIVE       Vitals (Most recent in last 24 hrs)     T: 36.5 C (03/19/20 1249)  BP: 116/64 (03/19/20 1249)  HR: 68 (03/19/20 1249)  RR: 16 (03/19/20 1249)  SpO2: 98 % (03/19/20 1249) Room air  T range: Temp  Min: 35.7 C  Max: 36.9 C  Admit weight: 67.5 kg (148 lb 13 oz) (03/18/20 1500)  Last weight: 67.6 kg (149 lb 0.5 oz) (03/19/20 0546)       I&Os:     Intake/Output Summary (Last 24 hours) at 03/19/2020 1533  Last data filed at 03/19/2020 1512  Intake 830 ml   Output 3150 ml   Net -2320 ml       Physical Exam  Ext: L arm > righ, no edema in UE or LE.    Labs (last 24 hours):   Chemistries  CBC  LFT  Gases, other   140 106 16 86   12.0   AST: 15 ALT: 11  -/-/-/-  -/-/-/-   4.4 27 0.85   2.54 >< 156  AP: 36 T bili: 0.5  Lact (a): - Lact (v): -   eGFR: >60 Ca: 9.9   35   Prot: 6.7 Alb: 4.2  Trop I: - D-dimer: -   Mg: 2.1 PO4: -  ANC: 2.85     BNP: 31 Anti-Xa: -     ALC: 0.73    INR: -                ASSESSMENT/PLAN       Yolanda Bowers is a6 year oldfemale with history of high-grade infiltrating ductal carcinoma of the left breast with  angiolymphatic invasion, history of neoadjuvant chemotherapy and presumed chemo-induced CM ( HFrEF (EF 45%)) who presents with fatigue, palpitations and VT found an outpatient CAM monitor.    1. Toxin-induced CM vs. PVC-induced CM: I directed the team to obtain an angiogram, obtain a cMR with gadolinium, and increase metoprolol to '25mg'$  BID. She will continue amiodarone.

## 2020-03-20 ENCOUNTER — Inpatient Hospital Stay (HOSPITAL_COMMUNITY): Payer: Commercial Managed Care - PPO

## 2020-03-20 ENCOUNTER — Encounter (HOSPITAL_COMMUNITY): Payer: Self-pay

## 2020-03-20 ENCOUNTER — Encounter (HOSPITAL_COMMUNITY)
Admission: AD | Disposition: A | Discharge status: Home / Self Care | Payer: Self-pay | Source: Home / Self Care | Attending: Cardiovascular Disease

## 2020-03-20 DIAGNOSIS — C50919 Malignant neoplasm of unspecified site of unspecified female breast: Secondary | ICD-10-CM

## 2020-03-20 DIAGNOSIS — I493 Ventricular premature depolarization: Principal | ICD-10-CM

## 2020-03-20 DIAGNOSIS — I427 Cardiomyopathy due to drug and external agent: Secondary | ICD-10-CM

## 2020-03-20 DIAGNOSIS — I509 Heart failure, unspecified: Secondary | ICD-10-CM

## 2020-03-20 DIAGNOSIS — R931 Abnormal findings on diagnostic imaging of heart and coronary circulation: Secondary | ICD-10-CM

## 2020-03-20 DIAGNOSIS — I517 Cardiomegaly: Secondary | ICD-10-CM

## 2020-03-20 DIAGNOSIS — I472 Ventricular tachycardia: Secondary | ICD-10-CM

## 2020-03-20 DIAGNOSIS — R69 Illness, unspecified: Secondary | ICD-10-CM

## 2020-03-20 DIAGNOSIS — T451X5A Adverse effect of antineoplastic and immunosuppressive drugs, initial encounter: Secondary | ICD-10-CM

## 2020-03-20 LAB — CBC (HEMOGRAM)
Hematocrit: 36 % (ref 36–45)
Hemoglobin: 12.1 g/dL (ref 11.5–15.5)
MCH: 30.3 pg (ref 27.3–33.6)
MCHC: 33.8 g/dL (ref 32.2–36.5)
MCV: 90 fL (ref 81–98)
Platelet Count: 176 10*3/uL (ref 150–400)
RBC: 4 10*6/uL (ref 3.80–5.00)
RDW-CV: 13.1 % (ref 11.6–14.4)
WBC: 3.13 10*3/uL — ABNORMAL LOW (ref 4.3–10.0)

## 2020-03-20 LAB — SARS-COV-2 (COVID-19) QUALITATIVE RAPID PCR: COVID-19 Coronavirus Qual PCR Result: NOT DETECTED

## 2020-03-20 LAB — BASIC METABOLIC PANEL
Anion Gap: 7 (ref 4–12)
Calcium: 9.3 mg/dL (ref 8.9–10.2)
Carbon Dioxide, Total: 29 meq/L (ref 22–32)
Chloride: 104 meq/L (ref 98–108)
Creatinine: 0.74 mg/dL (ref 0.38–1.02)
Glucose: 88 mg/dL (ref 62–125)
Potassium: 4.1 meq/L (ref 3.6–5.2)
Sodium: 140 meq/L (ref 135–145)
Urea Nitrogen: 16 mg/dL (ref 8–21)
eGFR by CKD-EPI: >60 mL/min/{1.73_m2} (ref >59)

## 2020-03-20 LAB — PROTHROMBIN TIME
Prothrombin INR: 1 (ref 0.8–1.3)
Prothrombin Time Patient: 12.4 s (ref 10.7–15.6)

## 2020-03-20 LAB — COVID-19 CORONAVIRUS QUALITATIVE PCR

## 2020-03-20 LAB — MAGNESIUM: Magnesium: 2.1 mg/dL (ref 1.8–2.4)

## 2020-03-20 SURGERY — CORONARY ANGIOGRAM
Site: Coronary

## 2020-03-20 MED ORDER — FENTANYL CITRATE (PF) 100 MCG/2ML IJ SOLN
INTRAMUSCULAR | Status: AC
Start: 2020-03-20 — End: ?
  Filled 2020-03-20: qty 2

## 2020-03-20 MED ORDER — IOHEXOL 350 MG/ML IV SOLN
INTRAVENOUS | Status: AC
Start: 2020-03-20 — End: ?
  Filled 2020-03-20: qty 200

## 2020-03-20 MED ORDER — MIDAZOLAM HCL 2 MG/2ML IJ SOLN
0.5000 mg | INTRAMUSCULAR | Status: DC | PRN
Start: 2020-03-20 — End: 2020-03-20
  Administered 2020-03-20: 0.5 mg via INTRAVENOUS

## 2020-03-20 MED ORDER — FENTANYL CITRATE (PF) 100 MCG/2ML IJ SOLN
25.0000 ug | INTRAMUSCULAR | Status: DC | PRN
Start: 2020-03-20 — End: 2020-03-20
  Administered 2020-03-20: 25 ug via INTRAVENOUS

## 2020-03-20 MED ORDER — SODIUM CHLORIDE 0.9% IV BOLUS
250.0000 mL | Freq: Once | INTRAVENOUS | Status: DC | PRN
Start: 2020-03-20 — End: 2020-03-22

## 2020-03-20 MED ORDER — IOHEXOL 350 MG/ML IV SOLN
INTRAVENOUS | Status: DC | PRN
Start: 2020-03-20 — End: 2020-03-20
  Administered 2020-03-20: 30 mL via INTRAVASCULAR

## 2020-03-20 MED ORDER — LIDOCAINE HCL (PF) 1 % IJ SOLN
INTRAMUSCULAR | Status: AC
Start: 2020-03-20 — End: ?
  Filled 2020-03-20: qty 30

## 2020-03-20 MED ORDER — NALOXONE HCL 0.4 MG/ML IJ SOLN
0.0400 mg | INTRAMUSCULAR | Status: DC | PRN
Start: 2020-03-20 — End: 2020-03-20

## 2020-03-20 MED ORDER — ONDANSETRON HCL 4 MG/2ML IJ SOLN
4.0000 mg | INTRAMUSCULAR | Status: DC | PRN
Start: 2020-03-20 — End: 2020-03-20

## 2020-03-20 MED ORDER — GADOTERIDOL 279.3 MG/ML IV SOLN
26.0000 mL | Freq: Once | INTRAVENOUS | Status: AC | PRN
Start: 2020-03-20 — End: 2020-03-20
  Administered 2020-03-20: 26 mL via INTRAVENOUS

## 2020-03-20 MED ORDER — HEPARIN SODIUM (PORCINE) 1000 UNIT/ML IJ SOLN
INTRAMUSCULAR | Status: AC
Start: 2020-03-20 — End: ?
  Filled 2020-03-20: qty 10

## 2020-03-20 MED ORDER — NITROGLYCERIN 200 MCG/ML 10 ML SYRINGE IN D5W (PROCEDURAL)
INTRAVENOUS | Status: DC | PRN
Start: 2020-03-20 — End: 2020-03-20
  Administered 2020-03-20: 100 ug via INTRA_ARTERIAL

## 2020-03-20 MED ORDER — LIDOCAINE HCL (PF) 1 % IJ SOLN
INTRAMUSCULAR | Status: DC | PRN
Start: 2020-03-20 — End: 2020-03-20
  Administered 2020-03-20: 5 mL via SUBCUTANEOUS

## 2020-03-20 MED ORDER — HEPARIN SODIUM (PORCINE) 1000 UNIT/ML IJ SOLN
INTRAMUSCULAR | Status: DC | PRN
Start: 2020-03-20 — End: 2020-03-20
  Administered 2020-03-20: 3000 [IU] via INTRAVENOUS

## 2020-03-20 MED ORDER — FLUMAZENIL 0.5 MG/5ML IV SOLN
0.2000 mg | INTRAVENOUS | Status: DC | PRN
Start: 2020-03-20 — End: 2020-03-20

## 2020-03-20 MED ORDER — METOPROLOL SUCCINATE ER 12.5 MG SPLIT TABLET
12.5000 mg | Freq: Once | ORAL | Status: AC
Start: 2020-03-20 — End: 2020-03-20
  Administered 2020-03-20: 12.5 mg via ORAL
  Filled 2020-03-20: qty 1

## 2020-03-20 MED ORDER — IOHEXOL 300 MG/ML IJ SOLN
INTRAMUSCULAR | Status: AC
Start: 2020-03-20 — End: ?
  Filled 2020-03-20: qty 50

## 2020-03-20 MED ORDER — SODIUM CHLORIDE 0.9 % IV SOLN
3.0000 mL/h | INTRAVENOUS | Status: DC
Start: 2020-03-20 — End: 2020-03-22

## 2020-03-20 MED ORDER — METOPROLOL SUCCINATE ER 25 MG OR TB24
25.0000 mg | EXTENDED_RELEASE_TABLET | Freq: Two times a day (BID) | ORAL | Status: DC
Start: 2020-03-20 — End: 2020-03-22
  Administered 2020-03-20 – 2020-03-22 (×4): 25 mg via ORAL
  Filled 2020-03-20 (×4): qty 1

## 2020-03-20 MED ORDER — ATROPINE SULFATE 1 MG/10ML IJ SOSY
0.5000 mg | PREFILLED_SYRINGE | INTRAMUSCULAR | Status: DC | PRN
Start: 2020-03-20 — End: 2020-03-22

## 2020-03-20 MED ORDER — NITROGLYCERIN 200 MCG/ML 10 ML SYRINGE IN D5W (PROCEDURAL)
INTRAVENOUS | Status: AC
Start: 2020-03-20 — End: ?
  Filled 2020-03-20: qty 10

## 2020-03-20 MED ORDER — MIDAZOLAM HCL 2 MG/2ML IJ SOLN
INTRAMUSCULAR | Status: AC
Start: 2020-03-20 — End: ?
  Filled 2020-03-20: qty 2

## 2020-03-20 MED ORDER — NITROGLYCERIN 0.4 MG SL SUBL
0.4000 mg | SUBLINGUAL_TABLET | SUBLINGUAL | Status: DC | PRN
Start: 2020-03-20 — End: 2020-03-20

## 2020-03-20 SURGICAL SUPPLY — 8 items
CATHETER ANGIO DIAGNOSTIC OPTITORQUE 5F 100CM JR4.0 (Catheter) ×3 IMPLANT
CATHETER OPTITORQ 5FR 100CM JL3.5 (Catheter) ×3 IMPLANT
DEVICE COMPRESS TR BAND REG (Other) ×3 IMPLANT
DRAPE BRACHIAL/RADIAL (Drape) ×3 IMPLANT
GUIDEWIRE .035 X 260CM EXCHANGE J TIP (Guidewire) ×3 IMPLANT
GUIDEWIRE WHOLEY .035 X 145CM MODIFIED J (Guidewire) ×3 IMPLANT
INTRODUCER KIT GLIDESHEATH SLENDER S/S 6FR 10CM SHEATH (Sheath) ×3 IMPLANT
PACK CUST LEFT HEART ANGIO (Pack) ×3 IMPLANT

## 2020-03-20 NOTE — Brief Op Note (Signed)
Immediate Brief Operative Note   Tyjai Janaiyah Blackard - DOB: 11-27-1970 (49 year old female) MRN: [id number]  Procedure Date: 03/18/2020 - 03/20/2020     [facility]       PROCEDURE DETAILS    Procedure Team:  Primary: Fredia Beets, MD  Fellow - Assisting: Cleon Gustin, MD     Procedure(s):   Cardiac catheterization - Left   Pre Procedure Diagnosis:  Chemotherapy induced cardiomyopathy ([place]) [I42.7, T45.1X5A]     Post Procedure Diagnosis:        * Chemotherapy induced cardiomyopathy ([place]) [I42.7, T45.1X5A]       PROCEDURE SUMMARY     Procedural Description:   Ultrasound guided arterial access (r radial; tr band)  Coronary angiography  Moderate sedation    Description of Findings:   Angiographically normal epicardial coronary arteries  Ventricular trigeminy    Estimated Blood Loss: < 100 cc    Specimens Removed: None    Post-operative Diagnosis: Chemotherapy induced cardiomyopathy   Ventricular trigeminy    [facility] Fellow

## 2020-03-20 NOTE — Nursing Note (Addendum)
Patient Summary  Hx of breast cancer and lymph node removal. Admitted for VT and NSVT seen on outpatient CAM monitor.    SR/SB 50-70s, frequent PVCs and occasional bigeminy and trigeminy; denies chest pain and SOB.  Diastolic BP in the 70'W at times, medical team aware.  Left heart cath and cardiac MRI done today 7/29.   Per transport RN, patient having possible Mobitz type 1 or sinus bradycardic rhythm in the 50's (occasionally to high 40's briefly) while in MRI - difficult to tell d/t some artifact.  Patient was asymptomatic, team aware.  Echo completed yesterday.       Illness Severity  Stable

## 2020-03-20 NOTE — Progress Notes (Signed)
Progress Note     Lashika Aleyza Salmi ("Yolanda Bowers") - DOB: 08/19/1971 (49 year old female)  Gender Identity: Female  Preferred Pronouns: she/her/hers  Admit Date: 03/18/2020  Code Status: Full Code     CHIEF CONCERN / IDENTIFICATION:  Yolanda Bowers is a 49 year old female with history of high-grade infiltrating ductal carcinoma of the left breast (ER+/PR+, FISH +) with angiolymphatic invasion, history of neoadjuvant chemotherapywith ddAC-THP,left mastectomy with lymph node biopsy, receivingHerceptin and Perjeta, HFrEF (EF 45%) who is presenting for VT found an outpatient CAM monitor.     SUBJECTIVE   INTERVAL HISTORY:  No acute events. Continues to have high burden of PVCs, averaging 20/min, with up to 38/min. She reports feeling well today, no acute complaints. LHC this AM w/o significant disease.     HOSPITAL COURSE:  7/27: Admit to Card B. EP consult.   7/28: Increase metoprolol to BID.   7/29: LHC neg. Cardiac MRI. Increase metoprolol.     SCHEDULED MEDICATIONS:   amiodarone, 400 mg, BID  .  cetirizine, 10 mg, Daily  .  heparin, 5,000 Units, q8h [facility]  .  letrozole, 2.5 mg, Daily  .  lisinopril, 2.5 mg, BID  .  metoprolol succinate ER, 25 mg, BID  .  metoprolol succinate ER, 12.5 mg, Once  .  venlafaxine ER, 75 mg, q AM    INFUSED MEDICATIONS:       PRN MEDICATIONS:  .  acetaminophen, 1,000 mg, q8h PRN  .  atropine, 0.5 mg, PRN  .  famotidine, 10 mg, Daily PRN  .  magnesium sulfate, 1 g, PRN  .  magnesium sulfate, 2 g, PRN  .  perflutren lipid microsphere (Definity) in NS injection, 1-10 mL, Once PRN  .  polyethylene glycol 3350, 17 g, Daily PRN  .  potassium chloride, 20 mEq, PRN  .  potassium chloride, 40 mEq, PRN  .  potassium chloride ER, 20 mEq, PRN  .  potassium chloride ER, 40 mEq, PRN  .  senna, 17.2 mg, BID PRN  .  sodium chloride, 250 mL, Once PRN       OBJECTIVE       Vitals (Most recent in last 24 hrs)     T: 36.7 C (03/20/20 0906)  BP: 120/41 (03/20/20 1134)  HR: 72 (03/20/20 1134)  RR: 18  (03/20/20 1134)  SpO2: 100 % (03/20/20 1134) Room air  T range: Temp  Min: 36 C  Max: 36.9 C  Admit weight: 67.5 kg (148 lb 13 oz) (03/18/20 1500)  Last weight: 67.4 kg (148 lb 11.2 oz) (03/20/20 0600)       I&Os:     Intake/Output Summary (Last 24 hours) at 03/20/2020 1203  Last data filed at 03/20/2020 0900  Intake 1258 ml   Output 2600 ml   Net -1342 ml     ROS  At time of exam, pt denies chest pain, palpitations, SOB, and lightheadness. A comprehensive ROS has been completed and is negative except as mentioned above in the remainder of the note.      Physical Exam  General: Alert and oriented female, pleasant, lying in the bed in NAD  HEENT: AT/NC, PERRL, oropharynx moist without redness or exudate, neck supple, JVD 5  CV: S1, S2, regular rhythm, no m/r/g  Pulm: Lungs CTA, no crackles or wheezes  Abd: Soft, non-tender to palpation, +BT  Upper extremities: Pink, warm, dry, capillary refill < 3 sec, + radial pulses bil., no edema  Lower  extremities: Pink, warm, dry, capillary refill < 3 sec, + DP pulses bil., no edema  Neuro: Grossly intact  Skin: No rashes, no ecchymosis, no ulcerations      Labs (last 24 hours):   Chemistries  CBC  LFT  Gases, other   140 104 16 88   12.1   AST: - ALT: -  -/-/-/-  -/-/-/-   4.1 29 0.74   3.13 >< 176  AP: - T bili: -  Lact (a): - Lact (v): -   eGFR: >60 Ca: 9.3   36   Prot: - Alb: -  Trop I: - D-dimer: -   Mg: 2.1 PO4: -  ANC: -     BNP: - Anti-Xa: -     ALC: -    INR: 1.0      TTE 03/19/20  1.  Borderline dilated left ventricle (LVIDd 5.6 cm, LVEDV index 56 ml/m2)  with mildly reduced systolic function (EF 45%). There is hypokinesis of the  distal lateral and anterolateral walls compared to the other segments  (unchanged from prior).  2.  Normal right ventricular size and function.  3.  Aneurysmal interatrial septum which bows rightward,  suggesting elevated  LA pressures (unchanged from prior).  4.  No pericardial effusion.    Compared to the prior study dated 02/13/2020, the  LV measures slightly larger  (LVIDd 5.6 cm vs. 5.2 cm) while the EF is comparable (48% vs. 45%). On side  by side imaging, LV function appears slightly improved.      LHC 03/20/20  NARRATIVE:  Right dominant system  The LM is a moderate caliber vessel bifurcating into the LAD and LCX; angiographically normal  The RCA is a moderate caliber vessel terminating with a bifurcation into a PDA and PLV systems; angiographically normal  The LAD is a moderate caliber vessel giving off four diagonal branches prior to terminating as a small vessel in the apex; angiographically normal.  The LCX is a small to moderate caliber vessel giving off small obtuse marginals prior to terminating in the AV groove; angiographically normal.  There is some decrease in flow which however remains TIMI 3 within all segments.    ASSESSMENT/PLAN     [name] is a 49 year old female with history of high-grade infiltrating ductal carcinoma of the left breast (ER+/PR+, FISH +) with angiolymphatic invasion, history of neoadjuvant chemotherapywith ddAC-THP,left mastectomy with lymph node biopsy, receivingHerceptin and Perjeta, HFrEF (EF 45%) who is presenting for VT found an outpatient CAM monitor.    PVCs  VT  7 day CAM monitor showed high burden of PVCs and several runs of VT, some sustained >30 seconds. The unperfused PVCs might explain her hx "intermittently low HR" and may be contributing to her fatigue and possibly contributing to her heart failure. As she has new heart failure and EF 45%, does not have device. Although it is assumed that her heart failure is chemo induced, but she has not had work up for other possible etiologies. If she has and ischemic component to her heart failure, this might be a cause for her PVCs and VT. Patient notes symptoms to be improved since initiation of amiodarone. Appreciate EP consulting. PVC burden improved since admission.     Plan  - Continue amiodarone 400 mg PO BID  - Increase metoprolol 25  mg BID - further increase to 50 mg BID tomorrow if tolerated  - Negative LHC on 03/20/20  - Cardiac MRI to look for  scar as etiology of ectopy and possible alternate etiology for heart failure  - Appreciate EP consulting, pending w/u and response to BB, considering ablation     Chemo induced heart failure  Systolic heart failure  New diagnosis ~May 2021. EF in May was 45%. 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 Unclear if her heart failure is reversible (Herceptin) or irreversible (Adriamycin). LHC on 7/29 without significant CAD to explain HF or arrhythmias.   TTE 03/19/20: EF 48%, hypokinesis lateral and anterolateral walls, no pericardial effusion (unchanged from prior)     Plan  - Repeat TTE as above   - LHC negative for significant CAD on 7/29  - Continue lisinopril 2.'5mg'$  Q12hours  - Increase Metoprolol as above    Hx cT3N3 left breast invasive ductal carcinoma, ER/PR/HER2+  S/p neoadjuvant chemo with ddAC-THP, left mastectomy and SLN biopsy, and adjuvant radiation therapy. Herceptin and perjeta were stopped when she was found to have low EF.     Plan  - Continue Letrozole 2.'5mg'$  daily  - Continue follow up with [facility]    Anxiety and Depression  2/2 to breast cancer and complex medical needs. Seen by [facility] Psych and started on Venlafaxine with some improvement. Her mood seems appropriate here. Seems to have high medical literacy and appreciates careful discussion of disease and potential interventions.    Plan  - Continue Venlafaxine '75mg'$  daily    Inpatient Checklist:    Prophylaxis: sc heparin  Lines/Drains/Airways: Port a cath  Disposition: Continue Cardiology B floor care  Code Status: Full Code  Contacts: Primary Emergency Contact: Caesar Chestnut, Home Phone: [phone]

## 2020-03-20 NOTE — Progress Notes (Signed)
Progress Note     Yolanda Bowers ("[name]") - DOB: 1970/10/18 (49 year old female)  Gender Identity: Female  Preferred Pronouns: she/her/hers  Admit Date: 03/18/2020  Code Status: Full Code     CHIEF CONCERN / IDENTIFICATION:  Yolanda Bowers is a 49 year old female with  history of high-grade infiltrating ductal carcinoma of the left breast with angiolymphatic invasion, history of neoadjuvant chemotherapy and presumed chemo-induced CM ( HFrEF (EF 45%)) who presents with fatigue, palpitations and VT found an outpatient CAM monitor.     SUBJECTIVE   INTERVAL HISTORY:    She denies a change in symptoms overnight.    SCHEDULED MEDICATIONS:   amiodarone, 400 mg, BID  .  cetirizine, 10 mg, Daily  .  heparin, 5,000 Units, q8h [facility]  .  letrozole, 2.5 mg, Daily  .  lisinopril, 2.5 mg, BID  .  metoprolol succinate ER, 25 mg, BID  .  venlafaxine ER, 75 mg, q AM    INFUSED MEDICATIONS:            OBJECTIVE       Vitals (Most recent in last 24 hrs)     T: 35.6 C (03/20/20 1430)  BP: 124/41 (03/20/20 1430)  HR: 70 (03/20/20 1430)  RR: 16 (03/20/20 1430)  SpO2: 96 % (03/20/20 1430) Room air  T range: Temp  Min: 35.6 C  Max: 36.9 C  Admit weight: 67.5 kg (148 lb 13 oz) (03/18/20 1500)  Last weight: 67.4 kg (148 lb 11.2 oz) (03/20/20 0600)       I&Os:     Intake/Output Summary (Last 24 hours) at 03/20/2020 1514  Last data filed at 03/20/2020 1400  Intake 1333 ml   Output 2200 ml   Net -867 ml       Physical Exam  Ext: L arm > right, no edema in UE or LE, normal distal pulse in right and left hands    Labs (last 24 hours):   Chemistries  CBC  LFT  Gases, other   140 104 16 88   12.1   AST: - ALT: -  -/-/-/-  -/-/-/-   4.1 29 0.74   3.13 >< 176  AP: - T bili: -  Lact (a): - Lact (v): -   eGFR: >60 Ca: 9.3   36   Prot: - Alb: -  Trop I: - D-dimer: -   Mg: 2.1 PO4: -  ANC: -     BNP: - Anti-Xa: -     ALC: -    INR: 1.0                ASSESSMENT/PLAN       Yolanda Bowers is a82 year oldfemale with history of high-grade  infiltrating ductal carcinoma of the left breast with angiolymphatic invasion, history of neoadjuvant chemotherapy and presumed chemo-induced CM ( HFrEF (EF 45%)) who presents with fatigue, palpitations and VT found an outpatient CAM monitor.    1. Toxin-induced CM vs. PVC-induced CM: I directed the team to increase metoprolol to '25mg'$  BID today and to '50mg'$  BID beginning tomorrow AM. CMR pending. Possible ablation if amiodarone and metoprolol do not dramatically decrease the burden of PVCs.

## 2020-03-20 NOTE — Nursing Note (Signed)
Patient Summary  Hx of breast cancer, lymph node removal and chemotherapy. Admitted for arrhythmia seen on outpatient heart monitor.      SR/SB 50-70s, frequent PVCs and occasional bigeminy and trigeminy. Denies chest pain and SOB. NPO since 0000 for LHC. Cardiac MRI ordered, awaiting scheduling. Echo completed yesterday.     Illness Severity  Stable

## 2020-03-20 NOTE — Consults (Signed)
Consult Note     Yolanda Bowers ("Sol") - DOB: 04-28-1971 (49 year old female)  Gender Identity: Female  Preferred Pronouns: she/her/hers  Admit Date: 03/18/2020  Code Status: Full Code     Consults    CHIEF CONCERN / IDENTIFICATION:  Yolanda Bowers is a 49 year old female with history of high-grade infiltrating ductal carcinoma of the left breast (ER+/PR+, FISH +) with angiolymphatic invasion, history of neoadjuvant chemotherapywith ddAC-THP,left mastectomy with lymph node biopsy, receivingHerceptin and Perjeta, HFrEF (EF 45%) who is presenting for VT found an outpatient CAM monitor.    Electrophysiology consulted for management of VT as well as consideration of ICD.     SUBJECTIVE   INTERVAL HISTORY:  Patient not seen today as she was at MRI.      MEDICATIONS:   Current Outpatient Medications   Medication Instructions   . acetaminophen (TYLENOL) 500 mg, Oral, 2 times daily PRN   . amiodarone (PACERONE) 400 mg, Oral, Daily   . cetirizine (ZYRTEC) 10 mg, Oral, Daily   . famotidine 20 MG tablet Take 1 tablet by mouth 2 times a day   . letrozole (FEMARA) 2.5 mg, Oral, Daily   . lisinopril (PRINIVIL; ZESTRIL) 2.5 mg, Oral, 2 times daily   . loperamide (IMODIUM) 2 mg, Oral, Daily PRN   . metoprolol succinate ER (TOPROL XL) 12.5 mg, Oral, Daily, Do not chew or crush.   . venlafaxine ER (EFFEXOR XR) 75 mg, Oral, Every morning       ALLERGIES:   Oxycodone, Codeine, Walnut, and Paclitaxel     OBJECTIVE       Vitals (Arrival)      T: 36.9 C (03/18/20 1418)  BP: 109/70 (03/18/20 1418)  HR: 78 (03/18/20 1418)  RR: 16 (03/18/20 1418)  SpO2: 96 % (03/18/20 1418) Room air   Vitals (Most recent in last 24 hrs)   T: 35.6 C (03/20/20 1430)  BP: 124/41 (03/20/20 1430)  HR: 70 (03/20/20 1430)  RR: 16 (03/20/20 1430)  SpO2: 96 % (03/20/20 1430) Room air  T range: Temp  Min: 35.6 C  Max: 36.9 C  Wt 148 lb 11.2 oz (67.45 kg)     Ht 5' 5.748" (1.67 m)     Body mass index is 24.19 kg/m.       PHYSICAL EXAM:  PATIENT  NOT EXAMINED TODAY    Labs (last 24 hours):   Chemistries  CBC  LFT  Gases, other   140 104 16 88   12.1   AST: - ALT: -  -/-/-/-  -/-/-/-   4.1 29 0.74   3.13 >< 176  AP: - T bili: -  Lact (a): - Lact (v): -   eGFR: >60 Ca: 9.3   36   Prot: - Alb: -  Trop I: - D-dimer: -   Mg: 2.1 PO4: -  ANC: -     BNP: - Anti-Xa: -     ALC: -    INR: 1.0        TTE 02/18/20  Conclusion  Normal left ventricular chamber size with normal wall thickness.  Mildly  decreased systolic function with EF 45% and no regional wall motion  abnormalities.  Global longitudinal strain mildly decreased, -16%.       Right ventricular size and function are normal. Catheter in the right heart.    No pericardial effusion.     On side by side comparison, global LV function is comparable (previous EF  51%),  b ut is decreased compared to the 2020 studies, when EF was 60%.    7 day CAM 02/28/20  . Predominant rhythm: NSR  . Atrial Tachycardia (AT) 9 episodes, Longest 16 beats @ Avg 138 bpm up to  209 bpm, Fastest 4 beats @ Avg 151 bpm up to 163 bpm  . Ventricular Tachycardia (VT) 36 episodes, Longest 48.6 s @ Avg 150 bpm  up to 198 bpm, Fastest 4 beats @ Avg 152 bpm up to 186 bpm  . Accelerated Idioventricular Rhythm (AIVR)  . Ectopic Atrial Rhythm (EAR)  . PAC 0.06 %  . PVC 21.29 %  ATTENDING STATEMENT  I agree with report above.  1. High PVC burden.  2. Several episodes of likely ventricular tachycardia, in strips 26 and later. With  one episode there's a PAC at the start, which is why I cannot 100% rule out  SVT with aberrancy, but other times with PVCs there's a P wave in the ST  segment, meaning I think the morphology and rhythm is more consistent with  VT than SVT. In Strip 32, I think there's VA dissociation which also favors VT.  Longest VT is >30 seconds so meets definition of sustained.                    ASSESSMENT/PLAN     Yolanda Bowers is a 49 year old female with history of high-grade infiltrating ductal carcinoma of the left breast  (ER+/PR+, FISH +) with angiolymphatic invasion, history of neoadjuvant chemotherapywith ddAC-THP,left mastectomy with lymph node biopsy, receivingHerceptin and Perjeta, HFrEF (EF 45%) who is presenting for VT found an outpatient CAM monitor.    Electrophysiology consulted for management of VT as well as consideration of ICD.    Monomorphic ventricular tachycardya  This appears to be an outflow tract PVC, which may be idiopathic or may be due to scar. Outflow tract PVCs are generally benign and are responsive to CCBs if this is idiopathic, but her HFrEF precludes the use of CCBs and we want to avoid amiodarone, therefore ablation would be the best therapy for her. The question is whether she also requires ICD. MRI would help delineate her substrate. If she has scar in her LVOT, then this may not be a benign VT and may push [place] more towards ICD.    Recommendations:  - VT ablation planned for tomorrow, please make NPO at midnight and have negative COVID testing within 72 hours of the procedure  - Please obtain cardiac MRI with official read prior to ablation tomorrow  - Hold amiodarone today in anticipation for VT ablation    The plan of care was discussed with attending cardiologist Dr. Martinique Prutkin who has not examined the patient, but has reviewed the patient's data and is in agreement with the plan. Please see their attestation for further details/clarifications.    Thank you for involving [place] in this patient's care. Cardiology EP consult will continue to follow.  Please contact us if you have any questions or concerns.

## 2020-03-21 ENCOUNTER — Inpatient Hospital Stay (HOSPITAL_COMMUNITY): Payer: Commercial Managed Care - PPO

## 2020-03-21 ENCOUNTER — Encounter (HOSPITAL_COMMUNITY)
Admission: AD | Disposition: A | Discharge status: Home / Self Care | Payer: Self-pay | Source: Home / Self Care | Attending: Cardiovascular Disease

## 2020-03-21 ENCOUNTER — Inpatient Hospital Stay (HOSPITAL_COMMUNITY): Payer: Commercial Managed Care - PPO | Admitting: Certified Registered Nurse Anesthetist

## 2020-03-21 ENCOUNTER — Other Ambulatory Visit: Payer: Self-pay

## 2020-03-21 DIAGNOSIS — I493 Ventricular premature depolarization: Secondary | ICD-10-CM

## 2020-03-21 LAB — BASIC METABOLIC PANEL
Anion Gap: 5 (ref 4–12)
Calcium: 9.2 mg/dL (ref 8.9–10.2)
Carbon Dioxide, Total: 30 meq/L (ref 22–32)
Chloride: 103 meq/L (ref 98–108)
Creatinine: 0.75 mg/dL (ref 0.38–1.02)
Glucose: 87 mg/dL (ref 62–125)
Potassium: 4 meq/L (ref 3.6–5.2)
Sodium: 138 meq/L (ref 135–145)
Urea Nitrogen: 21 mg/dL (ref 8–21)
eGFR by CKD-EPI: >60 mL/min/{1.73_m2} (ref >59)

## 2020-03-21 LAB — ACT HIGH RANGE (POC)
ACT HIGH RANGE (POC): 95 s
ACT HIGH RANGE (POC): 317 s
ACT HIGH RANGE (POC): 239 s
ACT HIGH RANGE (POC): 327 s
ACT HIGH RANGE (POC): 94 s

## 2020-03-21 LAB — CBC (HEMOGRAM)
Hematocrit: 37 % (ref 36–45)
Hemoglobin: 12.4 g/dL (ref 11.5–15.5)
MCH: 30.9 pg (ref 27.3–33.6)
MCHC: 33.9 g/dL (ref 32.2–36.5)
MCV: 91 fL (ref 81–98)
Platelet Count: 179 10*3/uL (ref 150–400)
RBC: 4.01 10*6/uL (ref 3.80–5.00)
RDW-CV: 13.3 % (ref 11.6–14.4)
WBC: 3.32 10*3/uL — ABNORMAL LOW (ref 4.3–10.0)

## 2020-03-21 LAB — TYPE AND SCREEN
ABO/Rh: O POS
Antibody Screen: NEGATIVE

## 2020-03-21 LAB — BLOOD TYPE CONFIRMATION: ABO/Rh: O POS

## 2020-03-21 LAB — PROTHROMBIN TIME
Prothrombin INR: 1 (ref 0.8–1.3)
Prothrombin Time Patient: 12.6 s (ref 10.7–15.6)

## 2020-03-21 LAB — MAGNESIUM: Magnesium: 2 mg/dL (ref 1.8–2.4)

## 2020-03-21 SURGERY — PVC ABLATION
Anesthesia: Monitor Anesthesia Care

## 2020-03-21 MED ORDER — PROTAMINE SULFATE 10 MG/ML IV SOLN
INTRAVENOUS | Status: DC | PRN
Start: 2020-03-21 — End: 2020-03-21
  Administered 2020-03-21 (×5): 10 mg via INTRAVENOUS
  Administered 2020-03-21: 5 mg via INTRAVENOUS
  Administered 2020-03-21 (×2): 10 mg via INTRAVENOUS

## 2020-03-21 MED ORDER — FENTANYL CITRATE (PF) 50 MCG/ML IJ SOLN WRAPPER (ANESTHESIA OSM ONLY)
INTRAMUSCULAR | Status: DC | PRN
Start: 2020-03-21 — End: 2020-03-21
  Administered 2020-03-21: 50 ug via INTRAVENOUS

## 2020-03-21 MED ORDER — LACTATED RINGERS IV SOLN
INTRAVENOUS | Status: DC | PRN
Start: 2020-03-21 — End: 2020-03-21

## 2020-03-21 MED ORDER — LIDOCAINE HCL PF 2% IV/IJ SOSY/SOLN WRAPPER (ANESTHESIA OSM ONLY)
INTRAVENOUS | Status: DC | PRN
Start: 2020-03-21 — End: 2020-03-21
  Administered 2020-03-21: 40 mg via INTRAVENOUS

## 2020-03-21 MED ORDER — PROPOFOL 500 MG/50ML IV EMUL INFUSION
INTRAVENOUS | Status: AC
Start: 2020-03-21 — End: ?
  Filled 2020-03-21: qty 350

## 2020-03-21 MED ORDER — MICONAZOLE NITRATE 2 % EX CREA
TOPICAL_CREAM | Freq: Two times a day (BID) | CUTANEOUS | Status: DC
Start: 2020-03-21 — End: 2020-03-22
  Filled 2020-03-21: qty 57

## 2020-03-21 MED ORDER — HEPARIN SODIUM (PORCINE) 1000 UNIT/ML IJ SOLN
INTRAMUSCULAR | Status: DC | PRN
Start: 2020-03-21 — End: 2020-03-21
  Administered 2020-03-21: 3000 [IU] via INTRAVENOUS
  Administered 2020-03-21: 10000 [IU] via INTRAVENOUS

## 2020-03-21 MED ORDER — SYRINGE (ANESTHESIA OSM ONLY)
INTRAVENOUS | Status: DC | PRN
Start: 2020-03-21 — End: 2020-03-21
  Administered 2020-03-21: 100 ug/kg/min via INTRAVENOUS

## 2020-03-21 MED ORDER — FENTANYL CITRATE (PF) 250 MCG/5ML IJ SOLN
INTRAMUSCULAR | Status: AC
Start: 2020-03-21 — End: ?
  Filled 2020-03-21: qty 5

## 2020-03-21 MED ORDER — PROPOFOL 10 MG/ML IV EMUL WRAPPER (OSM ONLY)
INTRAVENOUS | Status: DC | PRN
Start: 2020-03-21 — End: 2020-03-21
  Administered 2020-03-21: 40 mg via INTRAVENOUS
  Administered 2020-03-21 (×5): 20 mg via INTRAVENOUS
  Administered 2020-03-21: 30 mg via INTRAVENOUS
  Administered 2020-03-21: 20 mg via INTRAVENOUS

## 2020-03-21 MED ORDER — ONDANSETRON HCL 4 MG/2ML IJ SOLN
INTRAMUSCULAR | Status: DC | PRN
Start: 2020-03-21 — End: 2020-03-21
  Administered 2020-03-21: 4 mg via INTRAVENOUS

## 2020-03-21 MED ORDER — ISOPROTERENOL HCL 0.2 MG/ML IJ SOLN
0.0000 ug/min | INTRAVENOUS | Status: DC
Start: 2020-03-21 — End: 2020-03-22
  Administered 2020-03-21: 2 ug/min via INTRAVENOUS
  Filled 2020-03-21: qty 5

## 2020-03-21 MED ORDER — HEPARIN SODIUM (PORCINE) 1000 UNIT/ML IJ SOLN
INTRAMUSCULAR | Status: AC
Start: 2020-03-21 — End: ?
  Filled 2020-03-21: qty 10

## 2020-03-21 MED ORDER — PHENYLEPHRINE HCL-NACL 25-0.9 MG/250ML-% IV SOLN
INTRAVENOUS | Status: DC | PRN
Start: 2020-03-21 — End: 2020-03-21
  Administered 2020-03-21: .3 ug/kg/min via INTRAVENOUS

## 2020-03-21 MED ORDER — THROMBIN (RECOMBINANT) 20000 UNITS EX SOLR
20000.0000 [IU] | Freq: Once | CUTANEOUS | Status: DC
Start: 2020-03-21 — End: 2020-03-22
  Filled 2020-03-21: qty 20000

## 2020-03-21 MED ORDER — HEPARIN SOD (PORCINE) IN D5W 100 UNIT/ML IV SOLN
INTRAVENOUS | Status: DC | PRN
Start: 2020-03-21 — End: 2020-03-21
  Administered 2020-03-21: 1400 [IU]/h via INTRAVENOUS

## 2020-03-21 MED ORDER — VASOPRESSIN 20 UNIT/ML IV SOLN
INTRAVENOUS | Status: DC | PRN
Start: 2020-03-21 — End: 2020-03-21
  Administered 2020-03-21 (×2): 1 [IU] via INTRAVENOUS

## 2020-03-21 MED ORDER — HEPARIN SOD (PORCINE) IN D5W 100 UNIT/ML IV SOLN
INTRAVENOUS | Status: AC
Start: 2020-03-21 — End: ?
  Filled 2020-03-21: qty 250

## 2020-03-21 SURGICAL SUPPLY — 13 items
CABLE ABLATION C3 THERM NAV VISUALIZATION S-F CURVE REPROCESSED (Other) ×2 IMPLANT
CATHETER DIAGNOSTIC SOUNDSTAR ECO 3D ULTRASOUND 10F 90CM REPROCESSED (Cardiac Catheter / Guide) ×2 IMPLANT
CATHETER SMART TOUCH THERMOCOOL BI-DIRECTIONAL SF 8F FJ CURVE (Catheter) ×4 IMPLANT
COVER PROBE INTRAVASCULAR 6 X 96IN (Other) ×2 IMPLANT
INTRODUCER DIAG SZ10.5F 13CM (Other) ×2 IMPLANT
PACK ABLATION (Pack) ×2 IMPLANT
PATCH REFERENCE EXTERNAL CARTO 3 (6 UNITS) (Other) ×2
PATCH REFERENCE EXTERNAL CARTO 3 6 UNITS (Other) ×1 IMPLANT
SHEATH INTRODUCER AGILIS NXT 8.5F 71CM STEERABLE LARGE CURL (Sheath) ×2 IMPLANT
SHEATH INTRODUCER PINNACLE  4F X 10CM .035 W/GUIDEWIRE (Sheath) ×2 IMPLANT
SHEATH INTRODUCER PINNACLE  8F X 10CM .038 W/GUIDEWIRE (Sheath) ×2 IMPLANT
SHIELD MULTI-PURPOSE NO FENSTRATION (Other) ×2 IMPLANT
TUBING SMARTABLATE IRRIGATION (Tubing) ×6 IMPLANT

## 2020-03-21 NOTE — Progress Notes (Signed)
Nutrition Note    Assessment:  Yolanda Bowers is a60 year oldfemale with history of high-grade infiltrating ductal carcinoma of the left breast (ER+/PR+, FISH +) with angiolymphatic invasion, history of neoadjuvant chemotherapywith ddAC-THP,left mastectomy with lymph node biopsy, receivingHerceptin and Perjeta, HFrEF (EF 45%) who is presenting for VT found an outpatient CAM monitor    Wt Readings from Last 20 Encounters:   03/21/20 68 kg (149 lb 14.6 oz)   03/05/20 69.1 kg (152 lb 5.4 oz)   01/30/20 69.4 kg (153 lb)   01/12/20 69.6 kg (153 lb 7 oz)   01/11/20 69.3 kg (152 lb 12.8 oz)   12/20/19 71.3 kg (157 lb 4.8 oz)   12/01/19 72.4 kg (159 lb 11.6 oz)   08/31/19 72.6 kg (160 lb)   07/12/19 73.5 kg (162 lb)   06/15/19 76.2 kg (168 lb)   11/07/19 74.1 kg (163 lb 5.8 oz)     Current Wt 149 lb 14.6 oz (68 kg)  Body mass index is 24.38 kg/m.      Encounter:  Pt transferred to OR for RVOT tachycardia ablation today  EMR shows 50-100% PO of meals  Weight history shows 12-20lb weight loss in the last year. Will assess at follow up  GI:  Nauseated w/ PO meds  Labs:   Reviewed  Skin/fluid status:  +2 edema    Interventions:  PO   -Diet as able after procedure      Monitoring:   - Monitor wt trend, labs, PO, GI sx. Nutrition labs        RD following  Renee Rival, RD  See Treatment Team for RD contact

## 2020-03-21 NOTE — Anesthesia Preprocedure Evaluation (Addendum)
Patient: Yolanda Bowers    Procedure Information     Anesthesia Start Date/Time: 03/21/20 1237    Procedure: PVC Ablation (N/A )    Location: [facility] / [facility]    Providers: Prutkin, Martinique Matthew, MD        HPI: Per cardiology consult note:  "49 year old female with history of high-grade infiltrating ductal carcinoma of the left breast (ER+/PR+, FISH +) with angiolymphatic invasion, history of neoadjuvant chemotherapywith ddAC-THP,left mastectomy with lymph node biopsy, receivingHerceptin and Perjeta, HFrEF (EF 45%) who is presenting for VT found an outpatient CAM monitor.    Electrophysiology consulted for management of VT as well as consideration of ICD."    Relevant Problems   Cardio   (+) PVC's (premature ventricular contractions)      Oncology   (+) Malignant neoplasm of upper-outer quadrant of left breast in female, estrogen receptor positive ([place])       Relevant surgical history:   Past Surgical History:   Procedure Laterality Date    BREAST BIOPSY Right 11/2018    2 core bx      BREAST RECONSTRUCTION Right 06/25/2019    reduction - Dr. Lowella Dell    PR MASTECTOMY SIMPLE COMPLETE Left 06/25/2019    Had radiation         Medications:     Outpatient:   Current Outpatient Medications   Medication Instructions    acetaminophen 500 MG tablet Take 1-2 tabs by mouth once daily as needed for pain.    amiodarone (PACERONE) 400 mg, Oral, Daily    Calcium Citrate-Vitamin D (CITRACAL + D OR) 1 tablet, Oral, Daily    cetirizine (ZYRTEC) 10 mg, Oral, Daily    famotidine 20 MG tablet Take 1 tablet by mouth 2 times a day    letrozole (FEMARA) 2.5 mg, Oral, Daily    lisinopril (PRINIVIL; ZESTRIL) 2.5 mg, Oral, 2 times daily    metoprolol succinate ER (TOPROL XL) 12.5 mg, Oral, Daily, Do not chew or crush.    venlafaxine ER (EFFEXOR XR) 75 mg, Oral, Every morning        Inpatient:   Scheduled   [MAR Hold] cetirizine, 10 mg, Daily    [Held By Provider] heparin, 5,000 Units, q8h  [facility]    [MAR Hold] letrozole, 2.5 mg, Daily    [MAR Hold] lisinopril, 2.5 mg, BID    [MAR Hold] metoprolol succinate ER, 25 mg, BID    [MAR Hold] venlafaxine ER, 75 mg, q AM      Continuous  isoproterenol in D5W, 0-30 mcg/min, Continuous    sodium chloride, 3 mL/hr, Continuous      PRN    [MAR Hold] acetaminophen, 1,000 mg, q8h PRN    [MAR Hold] atropine, 0.5 mg, PRN    [MAR Hold] famotidine, 10 mg, Daily PRN    [MAR Hold] magnesium sulfate, 1 g, PRN    [MAR Hold] magnesium sulfate, 2 g, PRN    [MAR Hold] perflutren lipid microsphere (Definity) in NS injection, 1-10 mL, Once PRN    [MAR Hold] polyethylene glycol 3350, 17 g, Daily PRN    [MAR Hold] potassium chloride, 20 mEq, PRN    [MAR Hold] potassium chloride, 40 mEq, PRN    [MAR Hold] potassium chloride ER, 20 mEq, PRN    [MAR Hold] potassium chloride ER, 40 mEq, PRN    [MAR Hold] senna, 17.2 mg, BID PRN    [MAR Hold] sodium chloride, 250 mL, Once PRN  Review of patient's allergies indicates:  Allergies   Allergen Reactions    Oxycodone Skin: Itching    Codeine FU:[id number]    Walnut Skin: Rash    Paclitaxel Skin: Hives       Social History:   Social History     Tobacco Use    Smoking status: Never Smoker    Smokeless tobacco: Never Used   Substance Use Topics    Alcohol use: Not Currently    Drug use: Not Currently       ROS/MED HX     Physical Exam  Airway  Mallampati:  I  TM distance:  >6 cm  Neck ROM:  Full  Mouth Opening:  Normal  Facial Hair:  None    Dental  normal      Cardiovascular    Pulmonary           Labs: (last year)    BMP  CBC/Coags   Na 138 03/21/2020  Hb 12.4 03/21/2020   K 4.0 03/21/2020  HCT 37 03/21/2020   Cl 103 03/21/2020  WBC 3.32 (L) 03/21/2020   HCO3 30 03/21/2020  PLT 179 03/21/2020   BUN 21 03/21/2020  INR 1.0 03/21/2020   Cr 0.75 03/21/2020  PT 12.6 03/21/2020   Glu 87 03/21/2020  PTT - -       Misc   eGFR >60 03/21/2020  MCV 91 03/21/2020   A1C - -  BNP 31 03/18/2020       LFTs   AST 15 03/18/2020  Albumin 4.2  03/18/2020   ALT 11 03/18/2020  Protein 6.7 03/18/2020   Alk Phos 36 03/18/2020  T Bili 0.5 03/18/2020         Relevant procedures / diagnostic studies: none    Anesthesia Plan    PAT Discussion      Supervising Provider - Day of Procedure  ASA 3     Planned Anesthetic Type: MAC  MAC Justification: comorbid disease and surgeon request  Anesthetic plan and risks discussed with patient.    (Patient seen and examined in holding area. Risks and benefits of anesthesia discussed in detail. All patient questions answered. Plan for MAC with Christmas backup Patient agreed to proceed.)      Risk Calculators / Scores:

## 2020-03-21 NOTE — Anesthesia Postprocedure Evaluation (Signed)
Patient: Yolanda Bowers    Procedure Summary     Date: 03/21/20 Room / Location: [facility] / [facility]    Anesthesia Start: 1103 Anesthesia Stop: 1594    Procedure: PVC Ablation (N/A ) Diagnosis:       PVC's (premature ventricular contractions)      (PVC's (premature ventricular contractions) [I49.3])    Providers: Prutkin, [place] Matthew, MD Responsible Provider: Chad Cordial, MD    Anesthesia Type: MAC ASA Status: 3        Final Anesthesia Type: MAC    Vitals Value Taken Time   BP 101/63 03/21/20 1645   Temp 36.3 C 03/21/20 1630   Pulse 59 03/21/20 1656   SpO2 98 % 03/21/20 1656   Vitals shown include unvalidated device data.    Place of evaluation: other        Level of consciousness: fully conscious    Patient pain control satisfaction: patient is satisfied with level of pain control    Airway patency: patent    Cardiovascular status during assessment: stable    Respiratory status during assessment: breathing comfortably    Anesthetic complications: no    Intravascular volume status assessment: euvolemic    Nausea / vomiting: patient is not experiencing nausea      Planned post-operative disposition at time of assessment: ward care

## 2020-03-21 NOTE — Progress Notes (Signed)
Progress Note     Yolanda Bowers ("Yolanda Bowers") - DOB: 1971/05/28 (49 year old female)  Gender Identity: Female  Preferred Pronouns: she/her/hers  Admit Date: 03/18/2020  Code Status: Full Code     CHIEF CONCERN / IDENTIFICATION:  Yolanda Bowers is a 49 year old female with history of high-grade infiltrating ductal carcinoma of the left breast (ER+/PR+, FISH +) with angiolymphatic invasion, history of neoadjuvant chemotherapywith ddAC-THP,left mastectomy with lymph node biopsy, receivingHerceptin and Perjeta, HFrEF (EF 45%) who is presenting for VT found an outpatient CAM monitor.     SUBJECTIVE   INTERVAL HISTORY:  No acute events overnight. She reports feeling fatigued today, and a bit nauseated in the setting of taking medication while being NPO. PVCs still averaging 20/min despite BB increase. cMRI w/o scar or infiltrate, non-specific LGE.   - Proceeding with PVC ablation today per [place]:  7/27: Admit to Card B. EP consult.   7/28: Increase metoprolol to BID.   7/29: LHC neg. Cardiac MRI. Increase metoprolol.   7/30: PVC ablation.     SCHEDULED MEDICATIONS:   [MAR Hold] cetirizine, 10 mg, Daily  .  [Held By Provider] heparin, 5,000 Units, q8h [facility]  .  [MAR Hold] letrozole, 2.5 mg, Daily  .  [MAR Hold] lisinopril, 2.5 mg, BID  .  [MAR Hold] metoprolol succinate ER, 25 mg, BID  .  [MAR Hold] venlafaxine ER, 75 mg, q AM    INFUSED MEDICATIONS:       PRN MEDICATIONS:  .  [MAR Hold] acetaminophen, 1,000 mg, q8h PRN  .  [MAR Hold] atropine, 0.5 mg, PRN  .  [MAR Hold] famotidine, 10 mg, Daily PRN  .  [MAR Hold] magnesium sulfate, 1 g, PRN  .  [MAR Hold] magnesium sulfate, 2 g, PRN  .  [MAR Hold] perflutren lipid microsphere (Definity) in NS injection, 1-10 mL, Once PRN  .  [MAR Hold] polyethylene glycol 3350, 17 g, Daily PRN  .  [MAR Hold] potassium chloride, 20 mEq, PRN  .  [MAR Hold] potassium chloride, 40 mEq, PRN  .  [MAR Hold] potassium chloride ER, 20 mEq, PRN  .  [MAR Hold] potassium  chloride ER, 40 mEq, PRN  .  [MAR Hold] senna, 17.2 mg, BID PRN  .  [MAR Hold] sodium chloride, 250 mL, Once PRN       OBJECTIVE       Vitals (Most recent in last 24 hrs)     T: 36.5 C (03/21/20 1111)  BP: 119/60 (03/21/20 1111)  HR: 56 (03/21/20 1111)  RR: 16 (03/21/20 1111)  SpO2: 100 % (03/21/20 1111)        T range: Temp  Min: 35.6 C  Max: 36.6 C  Admit weight: 67.5 kg (148 lb 13 oz) (03/18/20 1500)  Last weight: 68 kg (149 lb 14.6 oz) (03/21/20 0248)       I&Os:     Intake/Output Summary (Last 24 hours) at 03/21/2020 1324  Last data filed at 03/21/2020 1300  Intake 525 ml   Output 2100 ml   Net -1575 ml     ROS  At time of exam, pt denies chest pain, palpitations, SOB, and lightheadness. A comprehensive ROS has been completed and is negative except as mentioned above in the remainder of the note.      Physical Exam  General: Alert and oriented female, pleasant, lying in the bed in NAD  HEENT: AT/NC, PERRL, oropharynx moist without redness or  exudate, neck supple, JVD 5  CV: S1, S2, regular rhythm, no m/r/g  Pulm: Lungs CTA, no crackles or wheezes  Abd: Soft, non-tender to palpation, +BT  Upper extremities: Pink, warm, dry, capillary refill < 3 sec, + radial pulses bil., no edema  Lower extremities: Pink, warm, dry, capillary refill < 3 sec, + DP pulses bil., no edema  Neuro: Grossly intact  Skin: No rashes, no ecchymosis, no ulcerations      Labs (last 24 hours): {Vanishing Tip  Labs Since Admission  Glycemic Control  :999}  Chemistries  CBC  LFT  Gases, other   138 103 21 87   12.4   AST: - ALT: -  -/-/-/-  -/-/-/-   4.0 30 0.75   3.32 >< 179  AP: - T bili: -  Lact (a): - Lact (v): -   eGFR: >60 Ca: 9.2   37   Prot: - Alb: -  Trop I: - D-dimer: -   Mg: 2.0 PO4: -  ANC: -     BNP: - Anti-Xa: -     ALC: -    INR: 1.0      TTE 03/19/20  1.  Borderline dilated left ventricle (LVIDd 5.6 cm, LVEDV index 56 ml/m2)  with mildly reduced systolic function (EF 37%). There is hypokinesis of the  distal lateral and  anterolateral walls compared to the other segments  (unchanged from prior).  2.  Normal right ventricular size and function.  3.  Aneurysmal interatrial septum which bows rightward,  suggesting elevated  LA pressures (unchanged from prior).  4.  No pericardial effusion.    Compared to the prior study dated 02/13/2020, the LV measures slightly larger  (LVIDd 5.6 cm vs. 5.2 cm) while the EF is comparable (48% vs. 45%). On side  by side imaging, LV function appears slightly improved.      LHC 03/20/20  NARRATIVE:  Right dominant system  The LM is a moderate caliber vessel bifurcating into the LAD and LCX; angiographically normal  The RCA is a moderate caliber vessel terminating with a bifurcation into a PDA and PLV systems; angiographically normal  The LAD is a moderate caliber vessel giving off four diagonal branches prior to terminating as a small vessel in the apex; angiographically normal.  The LCX is a small to moderate caliber vessel giving off small obtuse marginals prior to terminating in the AV groove; angiographically normal.  There is some decrease in flow which however remains TIMI 3 within all segments.    ASSESSMENT/PLAN     Yolanda Bowers is a 49 year old female with history of high-grade infiltrating ductal carcinoma of the left breast (ER+/PR+, FISH +) with angiolymphatic invasion, history of neoadjuvant chemotherapywith ddAC-THP,left mastectomy with lymph node biopsy, receivingHerceptin and Perjeta, HFrEF (EF 45%) who is presenting for VT found an outpatient CAM monitor.    PVCs  VT  7 day CAM monitor showed high burden of PVCs and several runs of VT, some sustained >30 seconds. The unperfused PVCs might explain her hx "intermittently low HR" and may be contributing to her fatigue and possibly contributing to her heart failure. As she has new heart failure and EF 45%, does not have device. Although it is assumed that her heart failure is chemo induced, but she has not had work up for other  possible etiologies. If she has and ischemic component to her heart failure, this might be a cause for her PVCs and VT. Patient notes symptoms to  be improved since initiation of amiodarone. Appreciate EP consulting. PVC burden improved since admission.     Plan  - Continue amiodarone 400 mg PO BID  - Continue metoprolol 25 mg BID   - Negative LHC on 03/20/20  - Cardiac MRI 7/29 negative for scar/infilrtrate  - Appreciate EP consulting  - Plan for PVC ablation today with EP    Chemo induced heart failure  Systolic heart failure  New diagnosis ~May 2021. EF in May was 45%. 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 Unclear if her heart failure is reversible (Herceptin) or irreversible (Adriamycin). LHC on 7/29 without significant CAD to explain HF or arrhythmias.   TTE 03/19/20: EF 48%, hypokinesis lateral and anterolateral walls, no pericardial effusion (unchanged from prior)     Plan  - Repeat TTE as above   - LHC negative for significant CAD on 7/29  - Continue lisinopril 2.'5mg'$  Q12hours  - Metoprolol as above    Hx cT3N3 left breast invasive ductal carcinoma, ER/PR/HER2+  S/p neoadjuvant chemo with ddAC-THP, left mastectomy and SLN biopsy, and adjuvant radiation therapy. Herceptin and perjeta were stopped when she was found to have low EF.     Plan  - Continue Letrozole 2.'5mg'$  daily  - Continue follow up with [facility]    Anxiety and Depression  2/2 to breast cancer and complex medical needs. Seen by [facility] Psych and started on Venlafaxine with some improvement. Her mood seems appropriate here. Seems to have high medical literacy and appreciates careful discussion of disease and potential interventions.    Plan  - Continue Venlafaxine '75mg'$  daily    Inpatient Checklist:    Prophylaxis: sc heparin  Lines/Drains/Airways: Port a cath  Disposition: Continue Cardiology B floor care  Code Status: Full Code  Contacts: Primary Emergency Contact: Yolanda Bowers, Home  Phone: [phone]

## 2020-03-21 NOTE — Anesthesia Procedure Notes (Signed)
PERIPHERAL IV    Staff:  Performing provider: Phebe Colla, CRNA  Authorizing provider: Chad Cordial, MD    Procedure Indication/Location:  Patient location: OR/Procedural area  Indication for IV: for care provided by anesthesia    Preparation:  Site prep: alcohol 70%  Skin local anesthetic used: local    Placement:  Laterality: right  Location: hand  Size: 18 G  Total attempts for all operators: 1    Date and Time Placed:   03/21/2020 1:00 PM

## 2020-03-21 NOTE — Progress Notes (Signed)
Progress Note     Yolanda Bowers ("Irini") - DOB: 1970/11/21 (49 year old female)  Gender Identity: Female  Preferred Pronouns: she/her/hers  Admit Date: 03/18/2020  Code Status: Full Code     CHIEF CONCERN / IDENTIFICATION:  Yolanda Bowers is a 49 year old female with  history of high-grade infiltrating ductal carcinoma of the left breast with angiolymphatic invasion, history of neoadjuvant chemotherapy and presumed chemo-induced CM ( HFrEF (EF 45%)) who presents with fatigue, palpitations and VT found an outpatient CAM monitor.     SUBJECTIVE   INTERVAL HISTORY:    She has persistent fatigue.     SCHEDULED MEDICATIONS:   [MAR Hold] cetirizine, 10 mg, Daily  .  [Held By Provider] heparin, 5,000 Units, q8h [facility]  .  [MAR Hold] letrozole, 2.5 mg, Daily  .  [MAR Hold] lisinopril, 2.5 mg, BID  .  [MAR Hold] metoprolol succinate ER, 25 mg, BID  .  [MAR Hold] venlafaxine ER, 75 mg, q AM    INFUSED MEDICATIONS:            OBJECTIVE       Vitals (Most recent in last 24 hrs)     T: 36.5 C (03/21/20 1111)  BP: 119/60 (03/21/20 1111)  HR: 56 (03/21/20 1111)  RR: 16 (03/21/20 1111)  SpO2: 100 % (03/21/20 1111)        T range: Temp  Min: 35.6 C  Max: 36.6 C  Admit weight: 67.5 kg (148 lb 13 oz) (03/18/20 1500)  Last weight: 68 kg (149 lb 14.6 oz) (03/21/20 0248)       I&Os:     Intake/Output Summary (Last 24 hours) at 03/21/2020 1337  Last data filed at 03/21/2020 1300  Intake 525 ml   Output 2100 ml   Net -1575 ml       Physical Exam  Ext: L arm > right, no edema in UE or LE (no change overnight)    Labs (last 24 hours):   Chemistries  CBC  LFT  Gases, other   138 103 21 87   12.4   AST: - ALT: -  -/-/-/-  -/-/-/-   4.0 30 0.75   3.32 >< 179  AP: - T bili: -  Lact (a): - Lact (v): -   eGFR: >60 Ca: 9.2   37   Prot: - Alb: -  Trop I: - D-dimer: -   Mg: 2.0 PO4: -  ANC: -     BNP: - Anti-Xa: -     ALC: -    INR: 1.0                ASSESSMENT/PLAN       Yolanda Bowers is a49 year oldfemale with history of  high-grade infiltrating ductal carcinoma of the left breast with angiolymphatic invasion, history of neoadjuvant chemotherapy and presumed chemo-induced CM ( HFrEF (EF 45%)) who presents with fatigue, palpitations and VT found an outpatient CAM monitor.    1. Toxin-induced CM vs. PVC-induced CM: No apparent effect of metoprolol or amiodarone on PVC burden. No scar by CMR. I directed the team to prepare her for RVOT tachycardia ablation today.

## 2020-03-21 NOTE — Procedure Nursing Note (Signed)
[facility] RN Post Procedure Summary Note    Procedure Performed:  PVC ablation    /Report Given to:  Gerald Stabs RN 5NE    Discharge Criteria:    VSS/within 20% of baseline    Pt meets goal for pain relief    Pt meets goal for nausea relief    Return to baseline respiratory status/minimal FiO2 requirements if transferred    Return to baseline neuro status    Tolerated Liquids    Tolerated Solids    PIV d/c'd      Evaluation of Procedural Site: R groin drsg c/d/i

## 2020-03-21 NOTE — Nursing Note (Signed)
Patient Summary  Hx of breast cancer and lymph node removal. Admitted for VT and NSVT seen on outpatient CAM monitor.    Remains SR w/ frequent PVCs and occasional bigeminy/trigeminy on tele. LHC & cardiac MRI done 7/29. EP following, recs for VT ablation today pending official cMRI read; has been NPO since [place].       Illness Severity  Stable

## 2020-03-21 NOTE — Nursing Note (Addendum)
Patient Summary  Hx of breast cancer and lymph node removal. Admitted for VT and NSVT seen on outpatient CAM monitor.    Remains SR w/ frequent PVCs and occasional bigeminy/trigeminy on tele. LHC & cardiac MRI done 7/29. VT ablation today, R groin site clean, dry,  soft, no hematoma noted. Bedrest until 20:25 (R leg straight, HOB 15-20 degrees).     Edited by: Janett Labella, RN at 03/21/2020 1229    Illness Severity  Stable

## 2020-03-22 ENCOUNTER — Other Ambulatory Visit (HOSPITAL_BASED_OUTPATIENT_CLINIC_OR_DEPARTMENT_OTHER): Payer: Self-pay

## 2020-03-22 DIAGNOSIS — R001 Bradycardia, unspecified: Secondary | ICD-10-CM

## 2020-03-22 LAB — MAGNESIUM: Magnesium: 2 mg/dL (ref 1.8–2.4)

## 2020-03-22 LAB — CBC (HEMOGRAM)
Hematocrit: 35 % — ABNORMAL LOW (ref 36–45)
Hemoglobin: 11.6 g/dL (ref 11.5–15.5)
MCH: 30.3 pg (ref 27.3–33.6)
MCHC: 33.3 g/dL (ref 32.2–36.5)
MCV: 91 fL (ref 81–98)
Platelet Count: 157 10*3/uL (ref 150–400)
RBC: 3.83 10*6/uL (ref 3.80–5.00)
RDW-CV: 13.6 % (ref 11.6–14.4)
WBC: 3.74 10*3/uL — ABNORMAL LOW (ref 4.3–10.0)

## 2020-03-22 LAB — BASIC METABOLIC PANEL
Anion Gap: 5 (ref 4–12)
Calcium: 9 mg/dL (ref 8.9–10.2)
Carbon Dioxide, Total: 31 meq/L (ref 22–32)
Chloride: 103 meq/L (ref 98–108)
Creatinine: 0.77 mg/dL (ref 0.38–1.02)
Glucose: 94 mg/dL (ref 62–125)
Potassium: 4 meq/L (ref 3.6–5.2)
Sodium: 139 meq/L (ref 135–145)
Urea Nitrogen: 14 mg/dL (ref 8–21)
eGFR by CKD-EPI: >60 mL/min/{1.73_m2} (ref >59)

## 2020-03-22 MED ORDER — DIPHENHYDRAMINE HCL 50 MG/ML IJ SOLN
25.0000 mg | Freq: Once | INTRAMUSCULAR | Status: AC
Start: 2020-03-22 — End: 2020-03-22
  Administered 2020-03-22: 25 mg via INTRAVENOUS
  Filled 2020-03-22: qty 1

## 2020-03-22 MED ORDER — METOPROLOL SUCCINATE ER 25 MG OR TB24
25.0000 mg | EXTENDED_RELEASE_TABLET | Freq: Two times a day (BID) | ORAL | 0 refills | Status: DC
Start: 2020-03-22 — End: 2020-03-28
  Filled 2020-03-22: qty 60, 30d supply, fill #0

## 2020-03-22 MED ORDER — LISINOPRIL 2.5 MG OR TABS
2.5000 mg | ORAL_TABLET | Freq: Once | ORAL | Status: AC
Start: 2020-03-22 — End: 2020-03-22
  Administered 2020-03-22: 2.5 mg via ORAL
  Filled 2020-03-22: qty 1

## 2020-03-22 MED ORDER — LISINOPRIL 5 MG OR TABS
5.0000 mg | ORAL_TABLET | Freq: Two times a day (BID) | ORAL | Status: DC
Start: 2020-03-22 — End: 2020-03-22

## 2020-03-22 MED ORDER — LISINOPRIL 5 MG OR TABS
5.0000 mg | ORAL_TABLET | Freq: Two times a day (BID) | ORAL | 0 refills | Status: DC
Start: 2020-03-22 — End: 2020-03-28
  Filled 2020-03-22: qty 60, 30d supply, fill #0

## 2020-03-22 MED ORDER — HEPARIN SOD (PORK) LOCK FLUSH 100 UNIT/ML IV SOLN
2.5000 mL | Freq: Once | INTRAVENOUS | Status: AC
Start: 2020-03-22 — End: 2020-03-22
  Administered 2020-03-22: 2.5 mL
  Filled 2020-03-22: qty 5

## 2020-03-22 MED ORDER — SPIRONOLACTONE 12.5 MG SPLIT TABLET
12.5000 mg | Freq: Every day | ORAL | Status: DC
Start: 2020-03-22 — End: 2020-03-22

## 2020-03-22 MED ORDER — HYDROCORTISONE 1 % EX OINT
TOPICAL_OINTMENT | Freq: Two times a day (BID) | CUTANEOUS | Status: DC
Start: 2020-03-22 — End: 2020-03-22
  Filled 2020-03-22 (×2): qty 28.35

## 2020-03-22 NOTE — Progress Notes (Signed)
Progress Note     Yolanda Bowers ("Zaidy") - DOB: 04-27-1971 (49 year old female)  Gender Identity: Female  Preferred Pronouns: she/her/hers  Admit Date: 03/18/2020  Code Status: Needs Review     CHIEF CONCERN / IDENTIFICATION:  Yolanda Bowers is a 49 year old female with PVCs/VT     SUBJECTIVE   INTERVAL HISTORY:  S/p VT/PVC ablation in the interleaflet trigone beneath the LCC-RCC commissure in the LVOT. Some groin bleeding overnight, improved this AM.     SCHEDULED MEDICATIONS:   cetirizine, 10 mg, Daily  .  [Held By Provider] heparin, 5,000 Units, q8h [facility]  .  letrozole, 2.5 mg, Daily  .  lisinopril, 5 mg, BID  .  lisinopril, 2.5 mg, Once  .  metoprolol succinate ER, 25 mg, BID  .  miconazole, , BID  .  spironolactone, 12.5 mg, Daily  .  thrombin (recombinant), 20,000 Units, Once  .  venlafaxine ER, 75 mg, q AM    INFUSED MEDICATIONS:  isoproterenol in D5W, 0-30 mcg/min, Continuous, Last Rate: Stopped (03/21/20 1541)  .  sodium chloride, 3 mL/hr, Continuous, Last Rate: Stopped (03/21/20 1545)     PRN MEDICATIONS:  .  acetaminophen, 1,000 mg, q8h PRN  .  atropine, 0.5 mg, PRN  .  famotidine, 10 mg, Daily PRN  .  magnesium sulfate, 1 g, PRN  .  magnesium sulfate, 2 g, PRN  .  polyethylene glycol 3350, 17 g, Daily PRN  .  potassium chloride, 20 mEq, PRN  .  potassium chloride, 40 mEq, PRN  .  potassium chloride ER, 20 mEq, PRN  .  potassium chloride ER, 40 mEq, PRN  .  senna, 17.2 mg, BID PRN  .  sodium chloride, 250 mL, Once PRN       OBJECTIVE       Vitals (Most recent in last 24 hrs)     T: 36.6 C (03/22/20 0932)  BP: 124/61 (03/22/20 0932)  HR: 63 (03/22/20 0946)  RR: 16 (03/22/20 0932)  SpO2: 97 % (03/22/20 0932) Room air  T range: Temp  Min: 36.3 C  Max: 37.1 C  Admit weight: 67.5 kg (148 lb 13 oz) (03/18/20 1500)  Last weight: 67.9 kg (149 lb 11.1 oz) (03/22/20 0600)       I&Os:     Intake/Output Summary (Last 24 hours) at 03/22/2020 1010  Last data filed at 03/22/2020 0600  Intake 1390.48 ml    Output 1965 ml   Net -574.52 ml       Physical Exam  Right groin: No hematoma, no active oozing, retaped.     Labs (last 24 hours):   Chemistries  CBC  LFT  Gases, other   139 103 14 94   11.6   AST: - ALT: -  -/-/-/-  -/-/-/-   4.0 31 0.77   3.74 >< 157  AP: - T bili: -  Lact (a): - Lact (v): -   eGFR: >60 Ca: 9.0   35   Prot: - Alb: -  Trop I: - D-dimer: -   Mg: 2.0 PO4: -  ANC: -     BNP: - Anti-Xa: -     ALC: -    INR: -           Reviewed Results? Independently visualized & interpreted? Key Findings     Lab _0  _1     Radiology _2  _3     EKG/Tele/Echo  _4  _5  Rare PVCs overnight  Other? _0  _1             ASSESSMENT/PLAN       49 year old admitted with PVCs/VT.  1. PVCs/monomorphic sustained VT. I think, in the end this is all normal heart VT, based on normal cath and MRI, and typical idiopathic VT location. I don't think she needs an ICD, and I don't think she needs an antiarrhythmic. I told her she may still have some PVCs, as we were never going to make it zero burden, but there has been a significant reduction since the ablation.     I think it's okay to d/c from EP standpoint, groin precautions for a week.    I spent a total of 20 minutes bedside/unit/floor on patient's care, with greater than 50% of the time counseling/coordinating care regarding PVC discussion with patient and coordinating with inpatient team.

## 2020-03-22 NOTE — Progress Notes (Signed)
Discharge Medication Education     Primary learner(s): Patient    Interpreter was present: No     Discharge medications:      CHANGE how you take these medications    . famotidine 20 MG tablet; Commonly known as: Pepcid; 20 mg, Oral, Daily   PRN; What changed: how much to take, how to take this, when to take this,   reasons to take this  . lisinopril 5 MG tablet; Commonly known as: Prinivil; Zestril; 5 mg,   Oral, 2 times daily; What changed: medication strength, how much to take  . metoprolol succinate ER 25 MG 24 hr tablet; Commonly known as: Toprol   XL; 25 mg, Oral, 2 times daily, Do not chew or crush.; What changed: how   much to take, when to take this     CONTINUE taking these medications    . acetaminophen 500 MG tablet; Commonly known as: Tylenol; Take 1-2 tabs   by mouth once daily as needed for pain.  . cetirizine 10 MG tablet; Commonly known as: ZyrTEC; 10 mg, Oral, Daily  . CITRACAL + D OR; 1 tablet, Oral, Daily  . letrozole 2.5 MG tablet; Commonly known as: Femara; 2.5 mg, Oral, Daily  . venlafaxine ER 75 MG 24 hr capsule; Commonly known as: Effexor XR; 75   mg, Oral, Every morning     STOP taking these medications    . amiodarone 200 MG tablet; Commonly known as: Futures trader discussed and provided: Yes    Outpatient pharmacy information: [facility]    Medication education:   The patient was counseled on the discharge medications above and was given a written, patient-friendly medication list. We reviewed the indications, administration, frequency, and side effects for each medication, including relevant dose changes, new medications and discontinued medications. Questions were answered at the time of teaching.      Post-education response:  States understanding    Orlando Penner, PharmD, BCCCP

## 2020-03-22 NOTE — Discharge Instructions (Signed)
Please weigh yourself each morning after you void, if your weight increases or decreases by 2-3 pounds in one day or 5 or more pounds in one week, please contact the clinic. Please take your blood pressure daily, if the top number is greater than 160 or less than 90, please contact the clinic. If you experience chest pain, palpitations, lightheadedness, dizziness, shortness of breath, decreased exercise tolerance, nausea, vomiting, diarrhea, fever, or chills, please contact the clinic or seek medical attention. Your cardiologist is Dr. [name], your coordinator is Fenton Malling you can reach them at [phone].    After ablation instructions (Lengthof time: 7 days): -Do not lift > 10 lbs.  -no Valsalva (do not hold breath or bear down), ***ok to use arms for mobility.  Check for sign and symptoms of infection on right groin site (check to see redness, discharge, odor). Check the groin site every day (look for bruising, lump, discoloration, bleeding). Please notify cardiology clinic for abnormal findings and call 911 for emergency.     Please have labs drawn next Tues or Wednesday at [facility].

## 2020-03-22 NOTE — Discharge Summary (Signed)
Discharge Summary     Yolanda Bowers ("Yolanda Bowers") - DOB: 1971-04-08 (49 year old female)  Gender Identity: Female  Preferred Pronouns: she/her/hers  PCP: Yolanda Journey, MD   Code Status: Needs Review     DATE OF ADMISSION: 03/18/2020  DATE OF DISCHARGE: 03/22/20  DISCHARGE TEAM & ATTENDING: Cardiology B Tx & Yolanda Beets, MD     ADMISSION DIAGNOSIS: PVCs  DISCHARGE DIAGNOSIS: s/p PVC ablation     PROBLEMS ADDRESSED DURING THIS HOSPITALIZATION:   Principal Problem (Resolved):    Ventricular tachyarrhythmia ([place])  Active Problems:    Chemotherapy induced cardiomyopathy ([place])    PVC's (premature ventricular contractions)    Malignant neoplasm of upper-outer quadrant of left breast in female, estrogen receptor positive ([place])      DISCHARGE FOLLOW-UP VISITS/APPOINTMENTS:    Upcoming appointments at [facility]:  Future Appointments   Date Time Provider [place]   04/03/2020 11:30 AM Yolanda Beets, MD [facility] TRA   04/07/2020 12:00 PM U ECHO U ECHO [facility] CARD   04/15/2020  9:00 AM Yolanda Parkin, MD [facility] SCCA ANCILLA        Additional follow-up:  No follow-up provider specified.    PENDING RESULTS THAT REQUIRE FOLLOW-UP (as of this summary):  Pending Labs     No pending labs        INCIDENTAL FINDINGS THAT REQUIRE FOLLOW-UP:   None      THERAPEUTIC RECOMMENDATIONS:   - Holter monitor  - Start spironolactone as an outpatient     ALLERGIES:  Oxycodone, Codeine, Walnut, and Paclitaxel      DISCHARGE MEDICATIONS:   Current Discharge Medication List      CONTINUE these medications which have CHANGED    Details   famotidine 20 MG tablet Take 1 tablet (20 mg) by mouth daily as needed for indigestion/heartburn.      lisinopril 5 MG tablet Take 1 tablet (5 mg) by mouth 2 times a day.  Qty: 60 tablet, Refills: 0    Associated Diagnoses: Chemotherapy induced cardiomyopathy (HCC)      metoprolol succinate ER 25 MG 24 hr tablet Take 1 tablet (25 mg) by mouth 2 times a day. Do not chew or crush.  Qty: 60 tablet,  Refills: 0    Associated Diagnoses: Chemotherapy induced cardiomyopathy ([place]); PVC's (premature ventricular contractions)         CONTINUE these medications which have NOT CHANGED    Details   acetaminophen 500 MG tablet Take 1-2 tabs by mouth once daily as needed for pain.      Calcium Citrate-Vitamin D (CITRACAL + D OR) Take 1 tablet by mouth daily.      cetirizine 10 MG tablet Take 10 mg by mouth daily.      letrozole 2.5 MG tablet Take 1 tablet (2.5 mg) by mouth daily.  Qty: 90 tablet, Refills: 3    Associated Diagnoses: Malignant neoplasm of upper-outer quadrant of left breast in female, estrogen receptor positive (HCC)      venlafaxine ER 75 MG 24 hr capsule Take 1 capsule (75 mg) by mouth every morning.  Qty: 90 capsule, Refills: 0    Associated Diagnoses: Adjustment disorder with anxious mood; Hot flashes; Recurrent major depressive disorder, in partial remission (HCC)         STOP taking these medications       amiodarone 200 MG tablet Comments:   Reason for Stopping:  BRIEF ADMISSION HISTORY:   Yolanda Bowers is a 49 year old female with hx of high grade infiltrating ductal carcinoma of L breast (ER+/PR+, FISH+) with angiolymphatic invasion, s/p chem with ddAC-THP, left mastectomy with lymph node biopsy, and Herceptin and Perjeta with recently diagnosed with systolic heart failure, EF 45% admitted for 7 day CAM monitor showing high burden of PVCs and multiple runs of sustained VT.      HOSPITAL COURSE:    Portions adapted from S. [place], NP Admit note 03/18/20   Yolanda Bowers is a 50 year old female with hx of high grade infiltrating ductal carcinoma of L breast (ER+/PR+, FISH+) with angiolymphatic invasion, s/p chem with ddAC-THP, left mastectomy with lymph node biopsy, and Herceptin and Perjeta with recently diagnosed with systolic heart failure, EF 45%. She was referred to Dr Yolanda Bowers in May and started on low dose GDMT with lisinopril and Metoprolol. She noticed increased  fatigue in early July and Holter monitor showed high burden PVCs and several episodes of VT, one that was >30seconds meeting the criteria of sustained. She was initiated on metoprolol and amiodarone and admission was arranged.     On presentation, she reported fatigue, DOE, and palpitations. PVC burden upon admission 20% and EP was consulted. Amiodarone was increased to 400 mg PO BID per EP and metoprolol was increased as well. To further investigate etiology of arrhythmias and heart failure, she underwent LHC on 7/29 that was negative for obstructive CAD. She also had a cardiac MRI that did not show scar or infiltration, had non-specific LGE, and LVEF 53%. Her PVC burden did not significantly improve with amiodarone and metoprolol alone (avg 20/min), so decision was made to pursue PVC ablation and amiodarone was stopped.      On 7/30, she underwent successful PVC ablation (Bowers) of the inter leaflet trigone in the LCC/RCC commissure below the aortic valve. PVC count post procedure was minimal, her R groin access site was c/d/i without hematoma on 7/31. She will be mailed a holter monitor for ongoing monitoring of arrhythmias. On day of discharge, her lisinopril was increased and she will have repeat labs next week. If her Cr and K are stable, plan to add spironolactone to her regimen.     Of note, she did experience maculopapular pruritic rash on 7/31 in bilateral AC fossae. No hives or airway compromise, no other areas affected, improved with ice, topical steroid, and IV benadryl. She was given return precautions if rash worsens.     DISPOSITION:    01 HOME/SELF CARE [01]    CONDITION: good     CONSULTS COMPLETED:    None     OPERATIONS/PROCEDURES:  Surgical/Procedural Cases on this Admission     Case IDs Date Procedure Surgeon Location Status    [phone] 03/20/20 Coronary angiogram Yolanda Beets, MD [place] CATH LAB Comp    [id number] 03/21/20 PVC Ablation Bowers, Yolanda Matthew, MD [facility]  ELECTROPHYSIOLOGY LAB Comp          Additional procedures:  None     PRINCIPAL DIAGNOSTIC STUDIES/RESULTS:    LHC 03/20/20  NARRATIVE:  Right dominant system  The LM is a moderate caliber vessel bifurcating into the LAD and LCX; angiographically normal  The RCA is a moderate caliber vessel terminating with a bifurcation into a PDA and PLV systems; angiographically normal  The LAD is a moderate caliber vessel giving off four diagonal branches prior to terminating as a small vessel in the apex; angiographically normal.  The LCX is a small to moderate caliber vessel giving off small obtuse marginals prior to terminating in the AV groove; angiographically normal.  There is some decrease in flow which however remains TIMI 3 within all segments.    Angiographically normal epicardial coronary arteries with mildly sluggish flow that is still TIMI 3. Ventricular trigeminy throughout the case.    Continue primary ASCVD prevention.     Cardiac MRI 03/20/20  1. The left ventricle (LV) is mildly dilated (LVEDVi= 94 ml/m2) with low normal systolic function (LVEF= 44%). No regional wall motion abnormalities. There is no LV hypertrophy (LVmassI= 45 gm/m2).    2. The right ventricle (RV) is normal in size (RVEDVi= 83 ml/m2) with normal systolic function (RVEF= 03%).     3. On delayed enhancement imaging there is intramyocardial late gadolinium enhancement at the inferior RV attachment site.    4. On limited T2-weighted imaging there is no evidence of myocardial edema.    5. On T1-mapping, the native T1 value is  1038ms (normal).    6. No significant valvular abnormalities.    7. Left atrial area measures 25cm2 and LAVI= 52mL/m2 (dilated) via biplanar method; Right atrial area measures 14cm2 via 4-chamber monoplanar method.    IMPRESSION    1. Mildly dilated LV (LVEDVi= 94 ml/m2) with low-normal systolic function (LVEF= 47%). Normal RV size and systolic function.    2. No MRI evidence of myocardial scar or infiltration. Focal  non-specific intramyocardial late gadolinium enhancement at the inferior RV attachment site, that can be seen in normal population.      Discharge Orders   Basic Metabolic Panel   Standing Status: Future Standing Exp. Date: 03/22/21     Release Result to Patient Immediate      CBC with Diff   Standing Status: Future Standing Exp. Date: 03/22/21     Release Result to Patient Immediate      Patch Monitor - 14 Day (Mail Out)   Standing Status: Future Standing Exp. Date: 03/22/21     Is this a mail out monitor? Yes      Cardiac (low cholesterol/ low fat/ NAS)     Diet type: Restrict salt intake to less than 2000 mg per day    Diet type: Low cholesterol    Diet type: Low fat        DISCHARGE PHYSICAL EXAM:     Vitals (Most recent in last 24 hrs)     T: 37.1 C (03/22/20 1628)  BP: 100/53 (03/22/20 1628)  HR: 65 (03/22/20 1628)  RR: 16 (03/22/20 1628)  SpO2: 97 % (03/22/20 1628) Room air  T range: Temp  Min: 36.5 C  Max: 37.1 C  Admit weight: 67.5 kg (148 lb 13 oz) (03/18/20 1500)  Last weight: 67.9 kg (149 lb 11.1 oz) (03/22/20 0600)       Physical Exam  GENERAL: Well developed, well nourished, in NAD.    HEENT: Normocephalic. Sclerae anicteric. Trachea midline.  Moist mucous membranes.   RESPIRATORY: CTA throughout. No wheezes or rhonchi. No accessory muscle usage.   CARDIOVASCULAR: Regular rate and rhythm.  Normal S1, S2. No murmurs, rubs, or gallops.    JVP/Neck Veins: low neck   Pulses: Radial 2+ bilaterally; DP/PT: 2+ bilaterally   Edema: No edema noted in bilateral lower extremities.  GI/GU: Abdomen is soft, non-tender, non-distended. Normoactive bowel sounds throughout. No HSM appreciated.   MUSCULOSKELETAL: No swelling, erythema, or obvious deformity noted on exam. Equal movement of all four extremities  on exam.   SKIN:  Warm, dry, without rashes or lesions. Cap refill <2 seconds bilateral upper and lower extremities. R groin site soft, no oozing, minimal tenderness. R port site c/d/i.   NEURO: A&O x 3.  Cranial nerves grossly intact.   PSYCH: Normal affect              [facility] physicians mentioned in this note can be reached by calling [employer] at [phone]. If any part of this transcript is missing or to request other transcripts for this patient call [phone]. For online access to patient records enroll in [place] at [email or web address].

## 2020-03-22 NOTE — Nursing Note (Signed)
Patient Summary  Hx of breast cancer and lymph node removal. Admitted for VT and NSVT seen on outpatient CAM monitor.    Remains SR w/ 5 beats WCT on tele overnight. Rt groin site from VT ablation, no evidence of hematoma/bruising, flank soft w/o pain.     D/C home today with family escort. Port a cath de-accessed and heparin instilled. Discharge teaching done. Pt verbalized understanding.   Edited by: Janett Labella, RN at 03/22/2020 1744    Illness Severity  Stable   Edited by: Charolette Forward, RN at 03/22/2020 0030

## 2020-03-22 NOTE — Nursing Note (Signed)
Patient Summary  Hx of breast cancer and lymph node removal. Admitted for VT and NSVT seen on outpatient CAM monitor.    Remains SR w/ 5 beats WCT on tele overnight. VSS, Rt groin site from VT ablation bleeding at 2300 - pressure held for 30 min, pressure drsng applied. Pt endorses some dizziness w/ position changes, SBP 120-130s. Has been on bedrest since pressure hold started; no evidence of hematoma/bruising, flank soft w/o pain. Calling appropriately for assistance.       Illness Severity  Stable

## 2020-03-24 LAB — EKG 12 LEAD
Atrial Rate: 63 {beats}/min
Atrial Rate: 58 {beats}/min
Diagnosis: NORMAL
P Axis: 49 degrees
P Axis: 49 degrees
P-R Interval: 160 ms
P-R Interval: 152 ms
Q-T Interval: 456 ms
Q-T Interval: 464 ms
QRS Duration: 96 ms
QRS Duration: 100 ms
QTC Calculation: 466 ms
QTC Calculation: 455 ms
R Axis: 99 degrees
R Axis: 105 degrees
T Axis: 75 degrees
T Axis: 84 degrees
Ventricular Rate: 63 {beats}/min
Ventricular Rate: 58 {beats}/min

## 2020-03-25 ENCOUNTER — Ambulatory Visit
Admission: RE | Admit: 2020-03-25 | Discharge: 2020-03-25 | Disposition: A | Discharge status: Home / Self Care | Payer: Commercial Managed Care - PPO | Attending: Cardiology | Admitting: Cardiology

## 2020-03-25 DIAGNOSIS — I427 Cardiomyopathy due to drug and external agent: Secondary | ICD-10-CM | POA: Insufficient documentation

## 2020-03-25 DIAGNOSIS — T451X5A Adverse effect of antineoplastic and immunosuppressive drugs, initial encounter: Secondary | ICD-10-CM | POA: Insufficient documentation

## 2020-03-25 NOTE — Procedure Nursing Note (Signed)
14 day CAM monitor mailed to patient to address on file.   Tracking Number: [id number].    MyChart message sent to patient.

## 2020-03-26 ENCOUNTER — Ambulatory Visit: Payer: Commercial Managed Care - PPO | Attending: Cardiology

## 2020-03-26 DIAGNOSIS — T451X5A Adverse effect of antineoplastic and immunosuppressive drugs, initial encounter: Secondary | ICD-10-CM | POA: Insufficient documentation

## 2020-03-26 DIAGNOSIS — I427 Cardiomyopathy due to drug and external agent: Secondary | ICD-10-CM | POA: Insufficient documentation

## 2020-03-26 LAB — CBC, DIFF
% Basophils: 0 %
% Eosinophils: 2 %
% Immature Granulocytes: 0 %
% Lymphocytes: 13 %
% Monocytes: 7 %
% Neutrophils: 78 %
Absolute Eosinophil Count: 0.12 10*3/uL (ref 0.00–0.50)
Absolute Lymphocyte Count: 0.68 10*3/uL — ABNORMAL LOW (ref 1.00–4.80)
Basophils: 0.02 10*3/uL (ref 0.00–0.20)
Hematocrit: 35 % — ABNORMAL LOW (ref 36–45)
Hemoglobin: 11.9 g/dL (ref 11.5–15.5)
Immature Granulocytes: 0.01 10*3/uL (ref 0.00–0.05)
MCH: 30.2 pg (ref 27.3–33.6)
MCHC: 33.6 g/dL (ref 32.2–36.5)
MCV: 90 fL (ref 81–98)
Monocytes: 0.35 10*3/uL (ref 0.00–0.80)
Neutrophils: 4.19 10*3/uL (ref 1.80–7.00)
Platelet Count: 181 10*3/uL (ref 150–400)
RBC: 3.94 10*6/uL (ref 3.80–5.00)
RDW-CV: 13.1 % (ref 11.6–14.4)
WBC: 5.37 10*3/uL (ref 4.3–10.0)

## 2020-03-26 LAB — BASIC METABOLIC PANEL
Anion Gap: 5 (ref 4–12)
Calcium: 9.8 mg/dL (ref 8.9–10.2)
Carbon Dioxide, Total: 31 meq/L (ref 22–32)
Chloride: 101 meq/L (ref 98–108)
Creatinine: 0.8 mg/dL (ref 0.38–1.02)
Glucose: 103 mg/dL (ref 62–125)
Potassium: 4.1 meq/L (ref 3.6–5.2)
Sodium: 137 meq/L (ref 135–145)
Urea Nitrogen: 22 mg/dL — ABNORMAL HIGH (ref 8–21)
eGFR by CKD-EPI: >60 mL/min/{1.73_m2} (ref >59)

## 2020-03-27 ENCOUNTER — Other Ambulatory Visit (HOSPITAL_BASED_OUTPATIENT_CLINIC_OR_DEPARTMENT_OTHER): Payer: Self-pay | Admitting: Cardiovascular Disease

## 2020-03-27 ENCOUNTER — Telehealth (HOSPITAL_BASED_OUTPATIENT_CLINIC_OR_DEPARTMENT_OTHER): Payer: Self-pay | Admitting: Cardiovascular Disease

## 2020-03-27 DIAGNOSIS — R079 Chest pain, unspecified: Secondary | ICD-10-CM

## 2020-03-28 ENCOUNTER — Ambulatory Visit: Payer: Commercial Managed Care - PPO | Attending: Cardiovascular Disease | Admitting: Cardiovascular Disease

## 2020-03-28 ENCOUNTER — Other Ambulatory Visit: Payer: Self-pay

## 2020-03-28 ENCOUNTER — Other Ambulatory Visit (HOSPITAL_BASED_OUTPATIENT_CLINIC_OR_DEPARTMENT_OTHER): Payer: Self-pay

## 2020-03-28 VITALS — BP 94/56 | HR 68 | Temp 96.6°F | Ht 66.0 in | Wt 149.0 lb

## 2020-03-28 DIAGNOSIS — T451X5A Adverse effect of antineoplastic and immunosuppressive drugs, initial encounter: Secondary | ICD-10-CM | POA: Insufficient documentation

## 2020-03-28 DIAGNOSIS — I427 Cardiomyopathy due to drug and external agent: Secondary | ICD-10-CM

## 2020-03-28 DIAGNOSIS — I429 Cardiomyopathy, unspecified: Secondary | ICD-10-CM | POA: Insufficient documentation

## 2020-03-28 DIAGNOSIS — I493 Ventricular premature depolarization: Secondary | ICD-10-CM

## 2020-03-28 DIAGNOSIS — Z170 Estrogen receptor positive status [ER+]: Secondary | ICD-10-CM | POA: Insufficient documentation

## 2020-03-28 DIAGNOSIS — C50412 Malignant neoplasm of upper-outer quadrant of left female breast: Secondary | ICD-10-CM | POA: Insufficient documentation

## 2020-03-28 MED ORDER — LISINOPRIL 2.5 MG OR TABS
2.5000 mg | ORAL_TABLET | Freq: Two times a day (BID) | ORAL | 3 refills | Status: DC
Start: 2020-03-28 — End: 2020-05-14
  Filled 2020-03-28 – 2020-04-30 (×2): qty 60, 30d supply, fill #0

## 2020-03-28 NOTE — Progress Notes (Signed)
CARDIOLOGY CLINIC NOTE    Name:  Yolanda Bowers  Date of Birth: [birth year]  Medical Record Number: [id number]  Primary Care Physician:  Margreta Journey, MD  Referring Provider:  Teena Dunk, *  Date of Service: 03/28/2020    ID  Yolanda Bowers is a 49 year old female with history of high-grade infiltrating ductal carcinoma of the left breast (ER+/PR+, FISH +) with angiolymphatic invasion, history of neoadjuvant chemotherapy with ddAC-THP, left mastectomy with lymph node biopsy, received 4 or 6 cycles of herceptin and Perjeta due to drop in EF, recent PVC ablation, here for follow-up.    Chief Complaint/Reason for Visit  Cardiomyopathy  Ms. Yolanda Bowers had a holter monitor to determine the frequency of PVCs and was found to have >21% along with sustained VT prompting initiation of amiodarone and switch to metoprolol from carvedilol.  She was admitted for additional workup including cMRI showing no evidence of LGE and echocardiogram showing stable cardiac function.  Coronary angiogram was also performed, which was unremarkable.  She underwent successful ablation of a PVC foci just underneath the AV without any PVC recurrence on close monitoring.  She was discharged on 7/31.    She called yesterday noting and being woken up by a sharp pain in the left chest lasting 10 seconds.  She also noted a left scapular pain, but without recurrence.  She went to the bathroom and went back to sleep.  She did not have any other occurrence.    She denies any palpitations since the ablation.  Still remains tired.  She is noting a rash in the forearm and in the abdomen, which started just on the left forearm on the day of discharge.  It is pruritic in nature.  No fevers.      Patient Active Problem List    Diagnosis Date Noted   . PVC's (premature ventricular contractions) [I49.3] 02/18/2020     Ablation on 03/21/2020:   Successful ablation for PVCs from the interleaflet trigone in the LCC/RCC commisure below the  aortic valve     . Chemotherapy induced cardiomyopathy ([place]) [I42.7, T45.1X5A] 01/19/2020   . Malignant neoplasm of upper-outer quadrant of left breast in female, estrogen receptor positive ([place]) [C50.412, Z17.0] 11/18/2019   . Macromastia [N62] 08/31/2019   . CIN I (cervical intraepithelial neoplasia I) [N87.0] 06/01/2018     Review of patient's allergies indicates:  Allergies   Allergen Reactions   . Oxycodone Skin: Itching   . Codeine HY:IFOYDX/AJOINOMV   . Metoprolol Skin: Rash     rash   . Walnut Skin: Rash   . Paclitaxel Skin: Hives     Current Outpatient Medications   Medication Sig Dispense Refill   . acetaminophen 500 MG tablet Take 1-2 tabs by mouth once daily as needed for pain.     . Calcium Citrate-Vitamin D (CITRACAL + D OR) Take 1 tablet by mouth daily.     . carVEDilol 3.125 MG tablet Take 1.562 mg by mouth 2 times a day.      . cetirizine 10 MG tablet Take 10 mg by mouth daily.     . famotidine 20 MG tablet Take 1 tablet (20 mg) by mouth daily as needed for indigestion/heartburn.     . letrozole 2.5 MG tablet Take 1 tablet (2.5 mg) by mouth daily. 90 tablet 3   . lisinopril 2.5 MG tablet Take 1 tablet (2.5 mg) by mouth 2 times a day. 180 tablet 3   . venlafaxine  ER 75 MG 24 hr capsule Take 1 capsule (75 mg) by mouth every morning. 90 capsule 0     No current facility-administered medications for this visit.        Review of Systems:  A complete review of systems was otherwise negative except as noted above.   Reviewed initial clinical health assessment    FAMILY HISTORY:  Premature coronary artery disease:  Very distant  diabetes : Yolanda Bowers  Hypertension: Yolanda Bowers  Stroke: grandmother (Yolanda)  Cancer:  Skin cancer (father), Yolanda Bowers (bone cancer)    SOCIAL HISTORY:  Metallurgist, non-smoker, no alcohol, no recreational drug use      Family History     Problem (# of Occurrences) Relation (Name,Age of Onset)    Alcohol/Drug (1) Maternal Yolanda Bowers    Diabetes (1) Yolanda Bowers    Hearing Loss (2) Yolanda Bowers,  Father    Obesity (2) Yolanda Bowers, Maternal Yolanda Bowers    Ovarian Cancer (1) Yolanda 66 (48)    Skin Cancer (2) Yolanda Bowers, Maternal Yolanda Bowers        Social History     Socioeconomic History   . Marital status: Married     Spouse name: Not on file   . Number of children: Not on file   . Years of education: Not on file   . Highest education level: Not on file   Occupational History   . Occupation: Pharmacist, hospital   Social Needs   . Financial resource strain: Not on file   . Food insecurity     Worry: Not on file     Inability: Not on file   . Transportation needs     Medical: Not on file     Non-medical: Not on file   Tobacco Use   . Smoking status: Never Smoker   . Smokeless tobacco: Never Used   Substance and Sexual Activity   . Alcohol use: Not Currently   . Drug use: Not Currently   . Sexual activity: Not on file   Lifestyle   . Physical activity     Days per week: Not on file     Minutes per session: Not on file   . Stress: Not on file   Relationships   . Social Product manager on phone: Not on file     Gets together: Not on file     Attends religious service: Not on file     Active member of club or organization: Not on file     Attends meetings of clubs or organizations: Not on file     Relationship status: Not on file   . Intimate partner violence     Fear of current or ex partner: Not on file     Emotionally abused: Not on file     Physically abused: Not on file     Forced sexual activity: Not on file   Other Topics Concern   . Not on file   Social History Narrative    Lives in [place] with her husband and two children, ages 61 and 21. She works as an Metallurgist.        Physical Exam:  Wt Readings from Last 3 Encounters:   03/28/20 67.6 kg (149 lb)   03/22/20 67.9 kg (149 lb 11.1 oz)   03/05/20 69.1 kg (152 lb 5.4 oz)     BP 94/56   Pulse 68   Temp 35.9 C (Temporal)   Ht '5\' 6"'$  (1.676 m)   Wt  67.6 kg (149 lb)   SpO2 98%   BMI 24.05 kg/m     General: well developed female  in no acute distress  Eyes:   Anicteric sclera, PERRLA  Neck: No masses, normal carotid pulses without audible bruit, thyroid gland is normal in size  Chest:  Lungs are clear to auscultation without wheezes, rales, or rhonchi  Cardiovascular: regular rate and rhythm, normal S1 and S2, no S3 or S4, 1/6 holosystolic murmur, PMI felt midclavicular, JVP of 5-6 cm  Abdomen: soft non-tender, non-distended with normal bowel sounds, liver span felt at the costal margin, no splenomegaly  Extremities: warm to touch bilaterally with +2 pulses in the femoral, popliteal, tibial, dorsalis pedis bilaterally , no edema bilaterally  Musculoskeletal:  Back is symmetric, no curvature, no tenderness.  Skin:  Normal skin color, texture.  No rashes or lesions  Neurologic:  Gait is normal, sensation grossly normal  Psychiatric:  Appropriate and responds to questions    Laboratory Results:   Results for orders placed or performed in visit on 44/81/85   Basic Metabolic Panel   Result Value Ref Range    Sodium 137 135 - 145 meq/L    Potassium 4.1 3.6 - 5.2 meq/L    Chloride 101 98 - 108 meq/L    Carbon Dioxide, Total 31 22 - 32 meq/L    Anion Gap 5 4 - 12    Glucose 103 62 - 125 mg/dL    Urea Nitrogen 22 (H) 8 - 21 mg/dL    Creatinine 0.80 0.38 - 1.02 mg/dL    Calcium 9.8 8.9 - 10.2 mg/dL    eGFR, Calculated >60 >59 mL/min/[1.73_m2]    GFR, Information       Calculated GFR by CKD-EPI equation. Inaccurate with changing renal function. See [email or web address].[email or web address].   CBC with Diff   Result Value Ref Range    WBC 5.37 4.3 - 10.0 10*3/uL    RBC 3.94 3.80 - 5.00 10*6/uL    Hemoglobin 11.9 11.5 - 15.5 g/dL    Hematocrit 35 (L) 36 - 45 %    MCV 90 81 - 98 fL    MCH 30.2 27.3 - 33.6 pg    MCHC 33.6 32.2 - 36.5 g/dL    Platelet Count 181 150 - 400 10*3/uL    RDW-CV 13.1 11.6 - 14.4 %    % Neutrophils 78 %    % Lymphocytes 13 %    % Monocytes 7 %    % Eosinophils 2 %    % Basophils 0 %    % Immature Granulocytes 0 %    Neutrophils 4.19 1.80 - 7.00  10*3/uL    Absolute Lymphocyte Count 0.68 (L) 1.00 - 4.80 10*3/uL    Monocytes 0.35 0.00 - 0.80 10*3/uL    Absolute Eosinophil Count 0.12 0.00 - 0.50 10*3/uL    Basophils 0.02 0.00 - 0.20 10*3/uL    Immature Granulocytes 0.01 0.00 - 0.05 10*3/uL    RBC Morphology See RBC data     Platelet Morphology See PLT count     WBC Morphology See Diff      WBC   Date Value Ref Range Status   12/20/2019 3.43 (L) 4.3 - 10.0 10*3/uL Final   11/06/2019 2.73 (L) 4.3 - 10.0 10*3/uL Final   09/24/2019 2.04 (L) 4.3 - 10.0 10*3/uL Final     Results for orders placed or performed in visit on 01/30/20   Lipid Panel   Result Value Ref Range  Cholesterol (Total) 165 <200 mg/dL    Triglyceride 106 <150 mg/dL    Cholesterol (HDL) 63 >39 mg/dL    Cholesterol (LDL) 81 <130 mg/dL    Non-HDL Cholesterol 102 0 - 159 mg/dL    Cholesterol/HDL Ratio 2.6     Lipid Panel, Additional Info. (NOTE)      Labs discussed with patient and provided in after visit summary.     Imaging:  Echo: 03/28/2019:  left ventricular end-diastolic dimension of 5.1VO, EF of 60%, strain -20%, trabeculated LV, normal valves, mild LAE, normal diastology    echocardiogram from 08/14/2019:  left ventricular end-diastolic dimension of 1.6WV, EF of 59%, GLS -20%, no change from 03/28/2019    Echocardiogram from 01/02/2020:   left ventricular end-diastolic dimension of 3.7TG, EF of 51%, GLS -13%, normal RV function, normal valves, decreased function compared to 08/14/2019    EKG:01/11/2020:  Normal sinus rhythm, heart rate of 74 bpm, normal PR interval, premature ventricular contractions, LAE    cMRI from 7/27:  1. The left ventricle (LV) is mildly dilated (LVEDVi= 94 ml/m2) with low normal systolic function (LVEF= 62%). No regional wall motion abnormalities. There is no LV hypertrophy (LVmassI= 45 gm/m2).    2. The right ventricle (RV) is normal in size (RVEDVi= 83 ml/m2) with normal systolic function (RVEF= 69%).     3. On delayed enhancement imaging there is intramyocardial late  gadolinium enhancement at the inferior RV attachment site.    4. On limited T2-weighted imaging there is no evidence of myocardial edema.    5. On T1-mapping, the native T1 value is  1049m (normal).    6. No significant valvular abnormalities.    7. Left atrial area measures 25cm2 and LAVI= 419mm2 (dilated) via biplanar method; Right atrial area measures 14cm2 via 4-chamber monoplanar method.    Assessment:   [age]ear old female with history of high-grade infiltrating ductal carcinoma of the left breast (ER+/PR+, FISH +) with angiolymphatic invasion, history of neoadjuvant chemotherapy with ddAC-THP, left mastectomy with lymph node biopsy, received Herceptin and Perjeta, which was discontinued after persistent decline in EF.  She completed 4 of 6 cycles.      She underwent recent PVC ablation for >21% PVC without evidence of pathologic LGE to suggest a risk for VT.  She did not need an ICD and is currently on metoprolol.  Amiodarone was stopped.  In regards to her cardiomyopathy, MRI shows EF of 53%, which is hopeful in regards to future myocardial recovery which could have been exacerbated by her PVC burden. However, I suspect she had developed some dysfunction secondary to her chemotherapy based on presence of LA enlargement.    Her rash has developed since starting metoprolol.  I would like to stop the metoprolol and switch her back to carvedilol.  Her blood pressure remains on the lower end.  Lisinopril will be adjusted.      Her sharp pain is not due to pericardial effusion, which was checked in clinic on echocardiogram.  Since it did not occur again, I would like to monitor for now.      Plan:  1.  Decrease lisinopril to 2.'5mg'$  BID and resume carvedilol 1.'625mg'$  BID.  Target blood pressure of 100/70  2.  Monitor rash--likely from metoprolol  3.  Monitor for recurrence of left sided chest pain  4.  Repeat echocardiogram next week to look for functional changes post PVC ablation  5.  Repeat holter to monitor  PVC burden    Follow-up:  Follow-up on 8/12.  Patient will call sooner if symptoms change.    Total time of >25 minutes was spent face-to-face with the patient, of which more than 50% was spent counseling and coordinating care.    Juanetta Beets, MD  Division of Cardiology  Advanced Heart Failure/Cardiac Transplantation  03/28/2020

## 2020-03-28 NOTE — Patient Instructions (Signed)
Please decrease the lisinopril to 2.5mg  twice daily    Continue with carvedilol    Monitor your blood pressure    Please keep a log    I will see you on 8/12.  Please let me know if you want to switch to telemedicine

## 2020-03-31 ENCOUNTER — Encounter (HOSPITAL_BASED_OUTPATIENT_CLINIC_OR_DEPARTMENT_OTHER): Payer: Self-pay | Admitting: Cardiovascular Disease

## 2020-03-31 ENCOUNTER — Encounter (HOSPITAL_BASED_OUTPATIENT_CLINIC_OR_DEPARTMENT_OTHER): Payer: Self-pay | Admitting: Medical Oncology

## 2020-03-31 ENCOUNTER — Telehealth (HOSPITAL_BASED_OUTPATIENT_CLINIC_OR_DEPARTMENT_OTHER): Payer: Self-pay

## 2020-03-31 DIAGNOSIS — C50412 Malignant neoplasm of upper-outer quadrant of left female breast: Secondary | ICD-10-CM

## 2020-03-31 DIAGNOSIS — Z170 Estrogen receptor positive status [ER+]: Secondary | ICD-10-CM

## 2020-03-31 NOTE — Telephone Encounter (Signed)
S: Tennyson called in and LM stating that she got her labs done last Wednesday at [facility]. She also reports a rash, and low level headaches.     B:    A:    R:

## 2020-04-02 ENCOUNTER — Encounter (HOSPITAL_BASED_OUTPATIENT_CLINIC_OR_DEPARTMENT_OTHER): Payer: Self-pay

## 2020-04-02 ENCOUNTER — Other Ambulatory Visit: Payer: Self-pay

## 2020-04-02 LAB — EKG 12 LEAD
Atrial Rate: 60 {beats}/min
Atrial Rate: 61 {beats}/min
Diagnosis: NORMAL
P Axis: 41 degrees
P Axis: 76 degrees
P-R Interval: 154 ms
P-R Interval: 146 ms
Q-T Interval: 444 ms
Q-T Interval: 456 ms
QRS Duration: 92 ms
QRS Duration: 92 ms
QTC Calculation: 444 ms
QTC Calculation: 459 ms
R Axis: 108 degrees
R Axis: 207 degrees
T Axis: 90 degrees
T Axis: 91 degrees
Ventricular Rate: 60 {beats}/min
Ventricular Rate: 61 {beats}/min

## 2020-04-03 ENCOUNTER — Ambulatory Visit: Payer: Commercial Managed Care - PPO | Attending: Cardiovascular Disease | Admitting: Cardiovascular Disease

## 2020-04-03 DIAGNOSIS — N870 Mild cervical dysplasia: Secondary | ICD-10-CM

## 2020-04-03 DIAGNOSIS — C50412 Malignant neoplasm of upper-outer quadrant of left female breast: Secondary | ICD-10-CM | POA: Insufficient documentation

## 2020-04-03 DIAGNOSIS — I427 Cardiomyopathy due to drug and external agent: Secondary | ICD-10-CM | POA: Insufficient documentation

## 2020-04-03 DIAGNOSIS — Z170 Estrogen receptor positive status [ER+]: Secondary | ICD-10-CM | POA: Insufficient documentation

## 2020-04-03 DIAGNOSIS — T451X5A Adverse effect of antineoplastic and immunosuppressive drugs, initial encounter: Secondary | ICD-10-CM | POA: Insufficient documentation

## 2020-04-03 DIAGNOSIS — I493 Ventricular premature depolarization: Secondary | ICD-10-CM | POA: Insufficient documentation

## 2020-04-03 NOTE — Patient Instructions (Signed)
Continue with meds

## 2020-04-03 NOTE — Progress Notes (Signed)
[facility] NOTE/TELEMEDICINE     Name:  Yolanda Bowers  Date of Birth: 1971/05/27  Medical Record Number: [id number]  Primary Care Physician:  Margreta Journey, MD  Referring Provider:  Teena Dunk, *  Date of Service: 04/03/2020    I conducted this encounter from [facility] via secure, live, face-to-face video conference with the patient. Yolanda Bowers was located at home.  Prior to the interview, the risks and benefits of telemedicine were discussed with the patient and verbal consent was obtained.      Time of visit:  1201    ID  Yolanda Bowers is a 49 year old female with history of high-grade infiltrating ductal carcinoma of the left breast (ER+/PR+, FISH +) with angiolymphatic invasion, history of neoadjuvant chemotherapy with ddAC-THP, left mastectomy with lymph node biopsy, received 4 or 6 cycles of herceptin and Perjeta due to drop in EF, recent PVC ablation, here for follow-up.    She reports walking more since her last clinic visit and feeling better.  She no longer has the rash after stopping the metoprolol.  She denies any palpitations, lightheadedness.  She is not having the sharp left chest pain.  She is more concerned about the swelling in the left arm pit and there is a nodule she notes in the right breast.  She is going to see breast clinic next week.      No heart failure symptoms.  Appetite is intact.  Sleeping more.      Patient Active Problem List    Diagnosis Date Noted   . PVC's (premature ventricular contractions) [I49.3] 02/18/2020     Ablation on 03/21/2020:   Successful ablation for PVCs from the interleaflet trigone in the LCC/RCC commisure below the aortic valve     . Chemotherapy induced cardiomyopathy ([place]) [I42.7, T45.1X5A] 01/19/2020   . Malignant neoplasm of upper-outer quadrant of left breast in female, estrogen receptor positive ([place]) [C50.412, Z17.0] 11/18/2019   . Macromastia [N62] 08/31/2019   . CIN I (cervical intraepithelial neoplasia I) [N87.0] 06/01/2018      Review of patient's allergies indicates:  Allergies   Allergen Reactions   . Oxycodone Skin: Itching   . Codeine QQ:PYPPJK/DTOIZTIW   . Metoprolol Skin: Rash     rash   . Walnut Skin: Rash   . Paclitaxel Skin: Hives     Current Outpatient Medications   Medication Sig Dispense Refill   . acetaminophen 500 MG tablet Take 1-2 tabs by mouth once daily as needed for pain.     . Calcium Citrate-Vitamin D (CITRACAL + D OR) Take 1 tablet by mouth daily.     . carVEDilol 3.125 MG tablet Take 1.562 mg by mouth 2 times a day.      . cetirizine 10 MG tablet Take 10 mg by mouth daily.     . famotidine 20 MG tablet Take 1 tablet (20 mg) by mouth daily as needed for indigestion/heartburn.     . letrozole 2.5 MG tablet Take 1 tablet (2.5 mg) by mouth daily. 90 tablet 3   . lisinopril 2.5 MG tablet Take 1 tablet (2.5 mg) by mouth 2 times a day. 180 tablet 3   . venlafaxine ER 75 MG 24 hr capsule Take 1 capsule (75 mg) by mouth every morning. 90 capsule 0     No current facility-administered medications for this visit.        Review of Systems:  A complete review of systems was otherwise negative except  as noted above.   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      Family History     Problem (# of Occurrences) Relation (Name,Age of Onset)    Alcohol/Drug (1) Maternal Grandfather    Diabetes (1) Mother    Hearing Loss (2) Mother, Father    Obesity (2) Mother, Maternal Grandfather    Ovarian Cancer (1) Paternal 40 (65)    Skin Cancer (2) Mother, Maternal Grandfather        Social History     Socioeconomic History   . Marital status: Married     Spouse name: Not on file   . Number of children: Not on file   . Years of education: Not on file   .  Highest education level: Not on file   Occupational History   . Occupation: Pharmacist, hospital   Social Needs   . Financial resource strain: Not on file   . Food insecurity     Worry: Not on file     Inability: Not on file   . Transportation needs     Medical: Not on file     Non-medical: Not on file   Tobacco Use   . Smoking status: Never Smoker   . Smokeless tobacco: Never Used   Substance and Sexual Activity   . Alcohol use: Not Currently   . Drug use: Not Currently   . Sexual activity: Not on file   Lifestyle   . Physical activity     Days per week: Not on file     Minutes per session: Not on file   . Stress: Not on file   Relationships   . Social Product manager on phone: Not on file     Gets together: Not on file     Attends religious service: Not on file     Active member of club or organization: Not on file     Attends meetings of clubs or organizations: Not on file     Relationship status: Not on file   . Intimate partner violence     Fear of current or ex partner: Not on file     Emotionally abused: Not on file     Physically abused: Not on file     Forced sexual activity: Not on file   Other Topics Concern   . Not on file   Social History Narrative    Lives in [place] with her husband and two children, ages 54 and 49. She works as an Metallurgist.        Physical Exam:  Wt Readings from Last 3 Encounters:   03/28/20 67.6 kg (149 lb)   03/22/20 67.9 kg (149 lb 11.1 oz)   03/05/20 69.1 kg (152 lb 5.4 oz)     There were no vitals taken for this visit.      blood pressure  112/68, heart rate 77  108/73, heart rate 89    General: well developed female  in no acute distress  Eyes:  Anicteric sclera, PERRLA  Neck: No masses, normal carotid pulses without audible bruit, thyroid gland is normal in size  Chest:  Lungs are clear to auscultation without wheezes, rales, or  rhonchi  Cardiovascular: regular rate and rhythm, normal S1 and S2, no S3 or S4, 1/6 holosystolic murmur, PMI felt midclavicular, JVP of 5-6 cm  Abdomen:  soft non-tender, non-distended with normal bowel sounds, liver span felt at the costal margin, no splenomegaly  Extremities: warm to touch bilaterally with +2 pulses in the femoral, popliteal, tibial, dorsalis pedis bilaterally , no edema bilaterally  Musculoskeletal:  Back is symmetric, no curvature, no tenderness.  Skin:  Normal skin color, texture.  No rashes or lesions  Neurologic:  Gait is normal, sensation grossly normal  Psychiatric:  Appropriate and responds to questions    Laboratory Results:   Results for orders placed or performed in visit on 62/22/97   Basic Metabolic Panel   Result Value Ref Range    Sodium 137 135 - 145 meq/L    Potassium 4.1 3.6 - 5.2 meq/L    Chloride 101 98 - 108 meq/L    Carbon Dioxide, Total 31 22 - 32 meq/L    Anion Gap 5 4 - 12    Glucose 103 62 - 125 mg/dL    Urea Nitrogen 22 (H) 8 - 21 mg/dL    Creatinine 0.80 0.38 - 1.02 mg/dL    Calcium 9.8 8.9 - 10.2 mg/dL    eGFR, Calculated >60 >59 mL/min/[1.73_m2]    GFR, Information       Calculated GFR by CKD-EPI equation. Inaccurate with changing renal function. See [email or web address].[email or web address].   CBC with Diff   Result Value Ref Range    WBC 5.37 4.3 - 10.0 10*3/uL    RBC 3.94 3.80 - 5.00 10*6/uL    Hemoglobin 11.9 11.5 - 15.5 g/dL    Hematocrit 35 (L) 36 - 45 %    MCV 90 81 - 98 fL    MCH 30.2 27.3 - 33.6 pg    MCHC 33.6 32.2 - 36.5 g/dL    Platelet Count 181 150 - 400 10*3/uL    RDW-CV 13.1 11.6 - 14.4 %    % Neutrophils 78 %    % Lymphocytes 13 %    % Monocytes 7 %    % Eosinophils 2 %    % Basophils 0 %    % Immature Granulocytes 0 %    Neutrophils 4.19 1.80 - 7.00 10*3/uL    Absolute Lymphocyte Count 0.68 (L) 1.00 - 4.80 10*3/uL    Monocytes 0.35 0.00 - 0.80 10*3/uL    Absolute Eosinophil Count 0.12 0.00 - 0.50 10*3/uL    Basophils 0.02 0.00 - 0.20 10*3/uL    Immature Granulocytes 0.01 0.00 - 0.05 10*3/uL    RBC Morphology See RBC data     Platelet Morphology See PLT count     WBC Morphology See Diff       WBC   Date Value Ref Range Status   12/20/2019 3.43 (L) 4.3 - 10.0 10*3/uL Final   11/06/2019 2.73 (L) 4.3 - 10.0 10*3/uL Final   09/24/2019 2.04 (L) 4.3 - 10.0 10*3/uL Final     Results for orders placed or performed in visit on 01/30/20   Lipid Panel   Result Value Ref Range    Cholesterol (Total) 165 <200 mg/dL    Triglyceride 106 <150 mg/dL    Cholesterol (HDL) 63 >39 mg/dL    Cholesterol (LDL) 81 <130 mg/dL    Non-HDL Cholesterol 102 0 - 159 mg/dL    Cholesterol/HDL Ratio 2.6     Lipid Panel, Additional Info. (NOTE)      Labs  discussed with patient and provided in after visit summary.     Imaging:  Echo: 03/28/2019:  left ventricular end-diastolic dimension of 1.4EL, EF of 60%, strain -20%, trabeculated LV, normal valves, mild LAE, normal diastology    echocardiogram from 08/14/2019:  left ventricular end-diastolic dimension of 9.5VU, EF of 59%, GLS -20%, no change from 03/28/2019    Echocardiogram from 01/02/2020:   left ventricular end-diastolic dimension of 0.2BX, EF of 51%, GLS -13%, normal RV function, normal valves, decreased function compared to 08/14/2019    EKG:01/11/2020:  Normal sinus rhythm, heart rate of 74 bpm, normal PR interval, premature ventricular contractions, LAE    cMRI from 7/27:  1. The left ventricle (LV) is mildly dilated (LVEDVi= 94 ml/m2) with low normal systolic function (LVEF= 43%). No regional wall motion abnormalities. There is no LV hypertrophy (LVmassI= 45 gm/m2).    2. The right ventricle (RV) is normal in size (RVEDVi= 83 ml/m2) with normal systolic function (RVEF= 56%).     3. On delayed enhancement imaging there is intramyocardial late gadolinium enhancement at the inferior RV attachment site.    4. On limited T2-weighted imaging there is no evidence of myocardial edema.    5. On T1-mapping, the native T1 value is  10781m (normal).    6. No significant valvular abnormalities.    7. Left atrial area measures 25cm2 and LAVI= 4102mm2 (dilated) via biplanar method; Right  atrial area measures 14cm2 via 4-chamber monoplanar method.    Assessment:   [age]ear old female with history of high-grade infiltrating ductal carcinoma of the left breast (ER+/PR+, FISH +) with angiolymphatic invasion, history of neoadjuvant chemotherapy with ddAC-THP, left mastectomy with lymph node biopsy, received Herceptin and Perjeta, which was discontinued after persistent decline in EF.  She completed 4 of 6 cycles.  She has not had any more palpitations since her PVC ablation and is feeling better overall.  She appears well compensated without any exacerbation.  Encouraging to see her blood pressure remains higher than before and heart rate is now increasing to >80.    She will benefit with uptitration of carvedilol.      Plan:  1.  Continue with lisinopril 2.81m66mID  2.  Increase carvedilol to 1.6281m56m AM and 3.1281mg2mPM.  If her blood pressure remains >100/70 and heart rate >70, she was instructed to increase the carvedilol to 3.1281mg 67min 3 days  3.  Agree with seeing breast clinic for changes noted on her breast exam  4.  Proceed with echocardiogram on 8/16 to see if there has been interval change since her ablation.    Follow-up:  Contact patient with results of echocardiogram on 8/16.  Follow-up in 3 months.  Patient will call sooner if symptoms change.    Total time of >25 minutes was spent face-to-face with the patient, of which more than 50% was spent counseling and coordinating care.    Nayellie Sanseverino Juanetta BeetsDivision of Cardiology  Advanced Heart Failure/Cardiac Transplantation  04/03/2020

## 2020-04-04 ENCOUNTER — Encounter (HOSPITAL_BASED_OUTPATIENT_CLINIC_OR_DEPARTMENT_OTHER): Payer: Self-pay

## 2020-04-07 ENCOUNTER — Ambulatory Visit
Admission: RE | Admit: 2020-04-07 | Discharge: 2020-04-07 | Disposition: A | Discharge status: Home / Self Care | Payer: Commercial Managed Care - PPO | Attending: Cardiovascular Disease | Admitting: Cardiovascular Disease

## 2020-04-07 ENCOUNTER — Other Ambulatory Visit: Payer: Self-pay

## 2020-04-07 DIAGNOSIS — I427 Cardiomyopathy due to drug and external agent: Secondary | ICD-10-CM

## 2020-04-07 DIAGNOSIS — T451X5A Adverse effect of antineoplastic and immunosuppressive drugs, initial encounter: Secondary | ICD-10-CM | POA: Insufficient documentation

## 2020-04-07 DIAGNOSIS — I493 Ventricular premature depolarization: Secondary | ICD-10-CM | POA: Insufficient documentation

## 2020-04-07 LAB — TRANSTHORACIC ECHO (TTE) LIMITED
IVSd: 0.76 cm
LV Systolic Volume (BP): 35.1 ml
LVIDd: 5.5 cm
LVIDs: 4.2 cm
LVPWd: 0.68 cm
RVDd: 3.6 cm
RVDd: 3.6 cm
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
## 2020-04-09 ENCOUNTER — Other Ambulatory Visit: Payer: Self-pay

## 2020-04-09 ENCOUNTER — Other Ambulatory Visit (HOSPITAL_BASED_OUTPATIENT_CLINIC_OR_DEPARTMENT_OTHER): Payer: Self-pay | Admitting: Cardiovascular Disease

## 2020-04-09 DIAGNOSIS — I429 Cardiomyopathy, unspecified: Secondary | ICD-10-CM

## 2020-04-10 ENCOUNTER — Ambulatory Visit
Admit: 2020-04-10 | Discharge: 2020-04-10 | Disposition: A | Discharge status: Home / Self Care | Payer: Commercial Managed Care - PPO | Attending: Diagnostic Radiology | Admitting: Diagnostic Radiology

## 2020-04-10 ENCOUNTER — Ambulatory Visit (HOSPITAL_BASED_OUTPATIENT_CLINIC_OR_DEPARTMENT_OTHER): Payer: Commercial Managed Care - PPO | Admitting: Registered Nurse

## 2020-04-10 ENCOUNTER — Encounter (HOSPITAL_BASED_OUTPATIENT_CLINIC_OR_DEPARTMENT_OTHER): Payer: Self-pay | Admitting: Registered Nurse

## 2020-04-10 ENCOUNTER — Ambulatory Visit (HOSPITAL_BASED_OUTPATIENT_CLINIC_OR_DEPARTMENT_OTHER)
Admit: 2020-04-10 | Discharge: 2020-04-10 | Disposition: A | Discharge status: Home / Self Care | Payer: Commercial Managed Care - PPO | Source: Home / Self Care

## 2020-04-10 ENCOUNTER — Encounter (HOSPITAL_BASED_OUTPATIENT_CLINIC_OR_DEPARTMENT_OTHER): Payer: Self-pay

## 2020-04-10 VITALS — BP 112/77 | HR 72 | Temp 97.7°F | Resp 16 | Wt 149.9 lb

## 2020-04-10 DIAGNOSIS — Z853 Personal history of malignant neoplasm of breast: Secondary | ICD-10-CM

## 2020-04-10 DIAGNOSIS — Z79811 Long term (current) use of aromatase inhibitors: Secondary | ICD-10-CM | POA: Insufficient documentation

## 2020-04-10 DIAGNOSIS — C50412 Malignant neoplasm of upper-outer quadrant of left female breast: Secondary | ICD-10-CM

## 2020-04-10 DIAGNOSIS — C50812 Malignant neoplasm of overlapping sites of left female breast: Secondary | ICD-10-CM | POA: Insufficient documentation

## 2020-04-10 DIAGNOSIS — Z170 Estrogen receptor positive status [ER+]: Secondary | ICD-10-CM | POA: Insufficient documentation

## 2020-04-10 DIAGNOSIS — N6310 Unspecified lump in the right breast, unspecified quadrant: Secondary | ICD-10-CM

## 2020-04-14 ENCOUNTER — Other Ambulatory Visit (HOSPITAL_BASED_OUTPATIENT_CLINIC_OR_DEPARTMENT_OTHER): Payer: Self-pay

## 2020-04-14 ENCOUNTER — Other Ambulatory Visit: Payer: Self-pay

## 2020-04-15 ENCOUNTER — Ambulatory Visit: Payer: Commercial Managed Care - PPO | Attending: Psychiatry | Admitting: Psychiatry

## 2020-04-15 ENCOUNTER — Encounter (HOSPITAL_BASED_OUTPATIENT_CLINIC_OR_DEPARTMENT_OTHER): Payer: Self-pay | Admitting: Psychiatry

## 2020-04-15 DIAGNOSIS — R4189 Other symptoms and signs involving cognitive functions and awareness: Secondary | ICD-10-CM | POA: Insufficient documentation

## 2020-04-15 DIAGNOSIS — Z79811 Long term (current) use of aromatase inhibitors: Secondary | ICD-10-CM | POA: Insufficient documentation

## 2020-04-15 DIAGNOSIS — Z170 Estrogen receptor positive status [ER+]: Secondary | ICD-10-CM | POA: Insufficient documentation

## 2020-04-15 DIAGNOSIS — F4322 Adjustment disorder with anxiety: Secondary | ICD-10-CM | POA: Insufficient documentation

## 2020-04-15 DIAGNOSIS — C50812 Malignant neoplasm of overlapping sites of left female breast: Secondary | ICD-10-CM | POA: Insufficient documentation

## 2020-04-15 DIAGNOSIS — F3341 Major depressive disorder, recurrent, in partial remission: Secondary | ICD-10-CM | POA: Insufficient documentation

## 2020-04-15 DIAGNOSIS — R232 Flushing: Secondary | ICD-10-CM | POA: Insufficient documentation

## 2020-04-15 MED ORDER — VENLAFAXINE HCL ER 75 MG OR CP24
75.0000 mg | EXTENDED_RELEASE_CAPSULE | Freq: Every morning | ORAL | 0 refills | Status: DC
Start: 2020-04-15 — End: 2020-05-27

## 2020-04-15 NOTE — Progress Notes (Signed)
Distant [facility] Telemedicine Encounter    I conducted this encounter from [facility] via secure, live, face-to-face video conference with the patient. Yolanda Bowers was located at home in [place], [place], without others present.  Prior to the interview, the risks and benefits of telemedicine were discussed with the patient and verbal consent was obtained.      [facility] PSYCHIATRY FOLLOW-UP NOTE   ID: Yolanda Bowers is a 49 year old female with cT3N3 left breast invasive ductal carcinoma, ER/PR/HER2+ (dx 11/2018), s/p neoadjuvant THP and L mastectomy (06/2019) and adjuvant radiation, now on adjuvant trastuzumab-pertuzumab (currently held for cardiac concerns) and letrozole, returning for psychiatric follow-up of depression and anxiety.    Chief Complaint: Stress (New job, cardiac issues, fears)    INTERVAL HX:   Last seen 7/13; venlafaxine continued at 75 mg given benefit. Discussed ongoing [place] care through [facility]. Interim records reviewed, including PM&R, breast surgery, cardiology (note of improving hypotension and HR s/p ablation; rec increase in carvedilol; metoprolol stopped for rash). She had breast US and mammogram 8/19 for concern of new R breast mass, read as benign.    She shares surprise in need for cardiac admission and having to cancel [facility] trip with her boys.  "I did really great. I didn't cry at all." She is grateful for time with her boys and cardiac interventions. She was admitted for 4 days. Her husband was away, guiding, during this period. She denies nightmares, flashbacks, intrusive symptoms from admission; she contrasts this to chemo, which was more distressing. She shared her experience with breast imaging, reassuring that this was post-surgical change.    Anxiety: She cites higher stress with recent cardiac issues, breast mass, starting new job. Despite this, anxiety scores are quite low, and she denies problems with significant anxiety. "I was not worried about stuff at home, I wasn't even stressed about the  heart stuff." She reflects "I would have been a basket case" if she'd had to handle this 6 months ago, due to significantly improved anxiety. She shares resilience in handling stressors throughout her life and reflections on mortality. She notes mild anxiety about CT chest and oncology follow-up in Sept.    Depression: She cites significant psychomotor symptoms, but we discussed cardiac pathology as the likely driver of these symptoms. She remains highly future oriented and hopeful. No PDW/SI. She feels better about herself. No guilt.    Sleep: she notes resumption of SCD; some night sweats and urination. Getting >10-11 hours of sleep/4h. Nap can last 3h. We discussed sleep hygiene and recommendations for shorter daytime naps if possible.    Energy: Slowly improving since ablation, but still significant each day. She continues in rehab. She is setting schedules and limits for activity, which is helpful. Her biggest anxiety is whether she has the energy to go full-time with work given cardiac symptoms/fatigue. She starts school next week, 9/1. She is arranging intermittent leave with support from Dr. [name].    Review of Systems  See interval history above.   Rash resolved after stopping metoprolol  She notes some low appetite    Current Outpatient Medications   Medication Sig Dispense Refill   . acetaminophen 500 MG tablet Take 1-2 tabs by mouth once daily as needed for pain.     . Calcium Citrate-Vitamin D (CITRACAL + D OR) Take 1 tablet by mouth daily.     . carVEDilol 3.125 MG tablet Take 1.562 mg by mouth 2 times a day.      . cetirizine  10 MG tablet Take 10 mg by mouth daily.     . famotidine 20 MG tablet Take 1 tablet (20 mg) by mouth daily as needed for indigestion/heartburn.     . letrozole 2.5 MG tablet Take 1 tablet (2.5 mg) by mouth daily. 90 tablet 3   . lisinopril 2.5 MG tablet Take 1 tablet (2.5 mg) by mouth 2 times a day. 180 tablet 3   . venlafaxine ER 75 MG 24 hr capsule Take 1 capsule (75 mg) by  mouth every morning. 90 capsule 0     No current facility-administered medications for this visit.        MENTAL STATUS EXAM:   Appearance: Well dressed, well groomed. Alert, awake, in no apparent distress.  Behavior: Cooperative with good eye contact  Motor: No tremors or abnormal movements noted. Exam limited due to telehealth visit.  Speech: Normal rate, rhythm, and tone with appropriate volume    Mood:  "I'm proud of myself"  Affect: Mood congruent, euthymic, reactive, full range  Thought process: Linear, logical, goal-oriented     Thought content: No SI/HI/AVH  Insight: Good  Judgment: Good  Orientation: Oriented to self, place, situation   Attention: Good  Memory: Grossly intact; not formally tested    SCREENING TOOLS:  Initial Pathway PHQ-9 Score    Flowsheets    Last refreshed: 04/15/2020  8:48 AM: Depression Care Pathway Initial PHQ-9 Not on file         Current as of: 04/15/2020  8:48 AM          Recent PHQ-9 Scores     PHQ9 Date 11/22/2019 12/25/2019 02/01/2020 03/04/2020 04/15/2020    PHQ9 Total Score '6 9 6 2 11        '$ GAD7 Total Scores       11/22/2019 12/25/2019 02/01/2020 03/04/2020 04/15/2020    Total:  '5  5  1  1  4          '$ LABS/IMAGES/STUDIES: Data was reviewed and relevant information is discussed in assessment and plan.    BP at most recent encounter:    BP Readings from Last 1 Encounters:   04/10/20 112/77     SAFETY ASSESSMENT:  Predisposing risk factors: Oncology history, Depression, Anxiety and trauma history with chronic intermittent thoughts of death                     Dynamic risk factors: Depression, Anxiety; cardiac and medical stressors                        Protective risk factors: Support from medical and mental health teams, moral prohibition to suicide, adaptive coping skills, high level of function, resilient, identity as mother, denies access to guns or weapons, improved mood/anxiety                          Imminence: Patient denies any current intent or plans of suicide or self-harm.                          Overall risk: Low risk                            Justification for level of care: Outpatient care given ability to maintain function and present to treatment  Interventions employed: Medication management  , Supportive therapy and Psychoeducation      ASSESSMENT:  Yolanda Bowers is a 49 year old female with cT3N3 left breast invasive ductal carcinoma, ER/PR/HER2+ (dx 11/2018), s/p neoadjuvant THP and L mastectomy (06/2019) and adjuvant radiation, now on adjuvant trastuzumab and pertuzumab, evaluated by [facility] psychiatry for concern of depression from her oncology team.  Her initial presentation was consistent with recurrent major depressive disorder, as well as adjustment disorder with anxiety.  She reports mild cognitive changes that may represent cancer-related cognitive dysfunction, and/or impacts of anxiety and depression.  Her initial PHQ 9 and GAD 7 scores underestimated her symptom burden at presentation, such that scores today reflect less improvement than she has experienced.  She had improved mood/anxiety, irritability, and hot flashes with venlafaxine, but had inadvertently been using BID, which may have driven some restlessness and early morning awakenings. In May, we consolidated to AM dosing.    Yolanda Bowers continues to report sustained improvement in anxiety, mood, and irritability with venlafaxine and supportive therapy. She is navigating cardiac toxicity related to HER-2 targeted therapies, which contributes to fatigue, even as she is working to increase her activity. She is making positive strides in setting/keeping goals and making positive life changes that reflect her needs and priorities.    Yolanda Bowers has faced significant cardiac and cancer related stressors in the past month, handling these with impressive poise and resilience, reflective that she is now much better able to manage anxiety and hold optimism for the future.  As fatigue and a motivation are likely related  to underlying cardiac disease, I do not recommend further titrating venlafaxine, though we agreed to revisit.  We plan on one final meeting in [facility] psychiatry after her next round of cancer scans, with transition back to medical and community providers if she remains stable.    DIAGNOSES & PLAN:   1. Episode of recurrent major depressive disorder, in partial remission (HCC)  - venlafaxine ER 75 MG 24 hr capsule; Take 1 capsule (75 mg) by mouth every morning.  Refill provided through [facility] mail order pharmacy  - Return Visit Psychiatry; Future  - Continue behavioral activation, noting physical limitations and need for rest  - Encourage pursuing intermittent leave from work for medical conditions if needed.  She is working with Dr. Berneice Heinrich.  - Continue rehab/PT    2. Adjustment disorder with anxious mood, with GAD traits  - venlafaxine ER 75 MG 24 hr capsule; Take 1 capsule (75 mg) by mouth every morning.  - Return Visit Psychiatry; Future  - Continue therapy through [facility] for work-related stressors--every 1 to 2 months, consider increased frequency if needed going forward    3. Hot flashes  - venlafaxine ER 75 MG 24 hr capsule; Take 1 capsule (75 mg) by mouth every morning.   - Continue acupuncture  - Consider venlafaxine increased if hot flashes do not improve in cooler weather  - Return Visit Psychiatry; Future    4. Cognitive changes- mild  - Continue to monitor; not impairing    5. Follow-up  - Ordered for 10/7; planned final visit and last new cancer related stressors emerge, discussed transition of venlafaxine back to medical providers and ongoing therapy through [facility] considerations:  - Do not expect venlafaxine further contributing to decrease in LVEF  - Labs 03/26/20 demonstrate normal Plt, LFTs, Na, and Cr; no dose adjustment needed. Hct mildly low, 35; considered in light of fatigue.  -  BP readings have been low to normal; no HTN, reassuring while on venlafaxine; monitoring through  cardiology  - ECG 03/22/20 notable for QTc 455, reassuring while on SNRI    I have discussed the treatment plan with the patient. The patient confirms understanding the risks and benefits of treatment, the alternatives, and has consented to the plan as outlined above.     THERAPY:   Time: 40 minutes  Techniques: Supportive Therapy and Insight-oriented Therapy  Themes:  Support and reassurance, Normalization and validation of feelings, Coping with diagnosis, Psychosocial stressors, Tolerating uncertainty, Depression, Coping skills and Behavioral strategies  Plan:  - Continue with psychotherapy and medication management.    I have discussed the treatment plan with the patient. The patient confirms understanding the risks and benefits of treatment, the alternatives, and has consented to the plan as outlined above.     Thereasa Parkin, MD

## 2020-04-16 ENCOUNTER — Telehealth (HOSPITAL_BASED_OUTPATIENT_CLINIC_OR_DEPARTMENT_OTHER): Payer: Self-pay | Admitting: Registered Nurse

## 2020-04-16 DIAGNOSIS — N6310 Unspecified lump in the right breast, unspecified quadrant: Secondary | ICD-10-CM

## 2020-04-16 DIAGNOSIS — I499 Cardiac arrhythmia, unspecified: Secondary | ICD-10-CM

## 2020-04-16 NOTE — Telephone Encounter (Signed)
LVM reiterating breast imaging results, showing no abnormalities at sites of palpable concern. Recommend clinical follow up with exam in 6 months to assess stability. If she appreciates any changes or concerns prior, I asked her to notify the clinic.

## 2020-04-23 ENCOUNTER — Other Ambulatory Visit (HOSPITAL_BASED_OUTPATIENT_CLINIC_OR_DEPARTMENT_OTHER): Payer: Commercial Managed Care - PPO

## 2020-04-25 ENCOUNTER — Encounter (HOSPITAL_BASED_OUTPATIENT_CLINIC_OR_DEPARTMENT_OTHER): Payer: Self-pay | Admitting: Cardiovascular Disease

## 2020-04-29 ENCOUNTER — Encounter (HOSPITAL_BASED_OUTPATIENT_CLINIC_OR_DEPARTMENT_OTHER): Payer: Self-pay | Admitting: Cardiovascular Disease

## 2020-04-29 DIAGNOSIS — I429 Cardiomyopathy, unspecified: Secondary | ICD-10-CM

## 2020-05-01 ENCOUNTER — Other Ambulatory Visit (HOSPITAL_BASED_OUTPATIENT_CLINIC_OR_DEPARTMENT_OTHER): Payer: Self-pay

## 2020-05-02 ENCOUNTER — Other Ambulatory Visit: Payer: Self-pay

## 2020-05-02 ENCOUNTER — Encounter (HOSPITAL_BASED_OUTPATIENT_CLINIC_OR_DEPARTMENT_OTHER): Payer: Self-pay | Admitting: Medical Oncology

## 2020-05-03 ENCOUNTER — Ambulatory Visit
Admission: RE | Admit: 2020-05-03 | Discharge: 2020-05-03 | Disposition: A | Discharge status: Home / Self Care | Payer: Commercial Managed Care - PPO | Attending: Diagnostic Radiology | Admitting: Diagnostic Radiology

## 2020-05-03 DIAGNOSIS — C50412 Malignant neoplasm of upper-outer quadrant of left female breast: Secondary | ICD-10-CM | POA: Insufficient documentation

## 2020-05-03 DIAGNOSIS — Z170 Estrogen receptor positive status [ER+]: Secondary | ICD-10-CM | POA: Insufficient documentation

## 2020-05-05 ENCOUNTER — Other Ambulatory Visit: Payer: Self-pay

## 2020-05-06 ENCOUNTER — Encounter (HOSPITAL_BASED_OUTPATIENT_CLINIC_OR_DEPARTMENT_OTHER): Payer: Self-pay

## 2020-05-06 MED ORDER — LISINOPRIL 2.5 MG OR TABS
2.5000 mg | ORAL_TABLET | Freq: Two times a day (BID) | ORAL | 0 refills | Status: DC
Start: 2020-05-06 — End: 2020-05-14

## 2020-05-06 NOTE — Telephone Encounter (Signed)
LVM for patient to call me to discuss her symptoms and to notify her that Dr [name] wants to schedule a Telemed visit on 9/22 at 1:30.  __________________________________  From: Juanetta Beets [[email or web address]   Sent: Monday, May 05, 2020 9:27 PM  To: Deeann Dowse  Subject: AR    HI Vicente Males-  Can you confirm with Jodelle Gross regarding her HR and BP?  Responding to her message.    Please find out of Donta's symptoms remain the same. Unfortunately, we can't use metoprolol due to the rash.  If her carvedilol is at 1.625mg /3.125mg  daily and blood pressure remain in the 110s, please have her increase the carvedilol to 3.125mg  BID.   I also would like to set up a telemedicine appt to check in.   Please schedule for next Wednesday 9/22 @ 130pm.    Thanks  Tillman Sers

## 2020-05-07 ENCOUNTER — Ambulatory Visit (HOSPITAL_BASED_OUTPATIENT_CLINIC_OR_DEPARTMENT_OTHER): Payer: Commercial Managed Care - PPO

## 2020-05-07 ENCOUNTER — Ambulatory Visit (HOSPITAL_BASED_OUTPATIENT_CLINIC_OR_DEPARTMENT_OTHER): Payer: Commercial Managed Care - PPO | Admitting: Medical Oncology

## 2020-05-07 ENCOUNTER — Ambulatory Visit: Payer: Commercial Managed Care - PPO | Attending: Medical Oncology

## 2020-05-07 DIAGNOSIS — C50412 Malignant neoplasm of upper-outer quadrant of left female breast: Secondary | ICD-10-CM | POA: Insufficient documentation

## 2020-05-07 DIAGNOSIS — Z170 Estrogen receptor positive status [ER+]: Secondary | ICD-10-CM

## 2020-05-07 DIAGNOSIS — F329 Major depressive disorder, single episode, unspecified: Secondary | ICD-10-CM | POA: Insufficient documentation

## 2020-05-07 DIAGNOSIS — M8580 Other specified disorders of bone density and structure, unspecified site: Secondary | ICD-10-CM | POA: Insufficient documentation

## 2020-05-07 DIAGNOSIS — R232 Flushing: Secondary | ICD-10-CM | POA: Insufficient documentation

## 2020-05-07 DIAGNOSIS — R911 Solitary pulmonary nodule: Secondary | ICD-10-CM | POA: Insufficient documentation

## 2020-05-07 LAB — COMPREHENSIVE METABOLIC PANEL
ALT (GPT): 13 U/L (ref 7–33)
AST (GOT): 16 U/L (ref 9–38)
Albumin: 4.2 g/dL (ref 3.5–5.2)
Alkaline Phosphatase (Total): 38 U/L (ref 34–121)
Anion Gap: 4 (ref 4–12)
Bilirubin (Total): 0.4 mg/dL (ref 0.2–1.3)
Calcium: 9.5 mg/dL (ref 8.9–10.2)
Carbon Dioxide, Total: 30 meq/L (ref 22–32)
Chloride: 104 meq/L (ref 98–108)
Creatinine: 0.76 mg/dL (ref 0.38–1.02)
Glucose: 96 mg/dL (ref 62–125)
Potassium: 4.2 meq/L (ref 3.6–5.2)
Protein (Total): 6.5 g/dL (ref 6.0–8.2)
Sodium: 138 meq/L (ref 135–145)
Urea Nitrogen: 15 mg/dL (ref 8–21)
eGFR by CKD-EPI: >60 mL/min/{1.73_m2} (ref >59)

## 2020-05-07 LAB — CBC, ABS NEUTROPHIL
Hematocrit: 34 % — ABNORMAL LOW (ref 36–45)
Hemoglobin: 12.1 g/dL (ref 11.5–15.5)
Immature Granulocytes: 0.01 10*3/uL (ref 0.00–0.05)
MCH: 31.3 pg (ref 27.3–33.6)
MCHC: 35.6 g/dL (ref 32.2–36.5)
MCV: 88 fL (ref 81–98)
Neutrophils: 1.49 10*3/uL — ABNORMAL LOW (ref 1.80–7.00)
Platelet Count: 178 10*3/uL (ref 150–400)
RBC: 3.86 10*6/uL (ref 3.80–5.00)
RDW-CV: 12.6 % (ref 11.6–14.4)
WBC: 2.58 10*3/uL — ABNORMAL LOW (ref 4.3–10.0)

## 2020-05-07 MED ORDER — LIDOCAINE HCL (PF) 1 % IJ SOLN
0.5000 mL | Freq: Once | INTRAMUSCULAR | Status: AC
Start: 2020-05-07 — End: 2020-05-07
  Administered 2020-05-07: 0.5 mL via INTRADERMAL

## 2020-05-07 NOTE — Telephone Encounter (Addendum)
TC from [place] this am with updated symptoms and blood pressure with HR    9/10  AM: 118/69 73  9/10  PM: 112/70 76  9/12  AM:  114/76 69  9/12  PM: 134/81 67  9/13  AM: 115/75 72  9/13  PM:  102/65 69    States last week for the first two days of school she felt like her heart was racing, she felt extremely fatigued, she was struggling to teach, and it was extremely difficult to talk with her mask on.     Reports extreme fatigue, dizziness off and on throughout day, not hungry at all, and reporting left sided chest pain when taking a deep breath for the last several days. Reports pain is constant throughout day.     Pt is planning on increasing carvedilol to 3.125mg  twice a day.     Ok to schedule for telemedicine appt with Dr. [name] on 9/22 at 1300

## 2020-05-08 LAB — ESTRADIOL: Estradiol: <20 pg/mL

## 2020-05-08 LAB — FOLLICLE STIMULATING HORMONE: Follicle Stimulating Hormone: 126 m[IU]/mL

## 2020-05-09 ENCOUNTER — Other Ambulatory Visit (HOSPITAL_BASED_OUTPATIENT_CLINIC_OR_DEPARTMENT_OTHER): Payer: Self-pay | Admitting: Nursing

## 2020-05-09 ENCOUNTER — Encounter (HOSPITAL_BASED_OUTPATIENT_CLINIC_OR_DEPARTMENT_OTHER): Payer: Self-pay | Admitting: Medical Oncology

## 2020-05-09 DIAGNOSIS — C50412 Malignant neoplasm of upper-outer quadrant of left female breast: Secondary | ICD-10-CM

## 2020-05-09 DIAGNOSIS — C50919 Malignant neoplasm of unspecified site of unspecified female breast: Secondary | ICD-10-CM

## 2020-05-09 DIAGNOSIS — Z170 Estrogen receptor positive status [ER+]: Secondary | ICD-10-CM

## 2020-05-10 NOTE — Progress Notes (Signed)
[facility]  BREAST ONCOLOGY CLINIC NOTE      IDENTIFICATION  Yolanda Bowers is a 49 year old female with history of cT3N3 left breast invasive ductal carcinoma, ER/PR/HER2+, s/p neoadjuvant chemotherapy with ddAC-THP, left mastectomy and SLN biopsy, and adjuvant radiation therapy. She had a complete pathologic response at the time of surgery. Presents to clinic today for labs and follow up while on letrozole.    ONCOLOGIC HISTORY     Oncology History Overview Note   10/2018: [name]   11/24/2018: Bilateral mammogram revealed a negative right breast. To the left breast UOQ, there was an 76m mass and a 2.4 cm mass laterally, 17 cm from the nipple. Targeted left breast ultrasound revealed at 3 oclock 8 cm from the nipple, a 21x13x231mmass and an enlarged axillary lymph node measuring 12x11x1567mThere were multiple enlarged lymph nodes  11/27/2018: US-guided biopsy of the breast mass revealed invasive ductal carcinoma, ER/PR+, HER2+, grade 3. There was angiolymphatic invasion seen on biopsy. Biopsy of axillary lymph node positive for carcinoma.   12/08/2018: Bilateral breast MRI revealed right breast at 3 oclock, 2.7cm from the nipple, a 10 x 6 x8mm68mss and at 12 oclock, 11cm from the nipple, a spiculated 16 x 4 x 13mm95ms. Ultrasound recommended.  To the left breast, mass consistent with biopsy proved carcinoma measuring 24 x 20 x 24mm.63mre was also diffuse NME involving mid-outer breast extending over 110x45x69mm. 57min the NME, there was a small spiculated mass at 12 oclock measuring 12mm. M70mple level 1,2 and 3 lymph nodes seen, in addition to at least 3 internal mammary nodes.   12/14/2018: PET CT without evidence of distant metastasis. There are 4mm lung60mdules bilaterally.   12/29/2018: Started on neoadjuvant chemotherapy with ddAC x4 cycles.  03/09/2019: Started on neoadjuvant chemotherapy with Taxol, Herceptin, and  Perjeta.  06/25/2019: Underwent left mastectomy and SLN biopsy with Dr. David ByrImagene Shellergy revealed a single focus of angiolymphatic involvement by carcinoma (< 0.1cm). Negative surgical margins. 0 of 5 lymph nodes positive for carcinoma. ypT0 ypN0.   09/21/2019: Completed XRT.  Started on letrozole 2.5mg PO da64m.     01/29/2020: Given drop in LVEF, last Herceptin/Perjeta infusion was discontinued. Letrozole continued.    Negative genetic panel testing, 11/2018.     Malignant neoplasm of upper-outer quadrant of left breast in female, estrogen receptor positive (HCC)   3/1[place]021 - 01/29/2020 Systemic Therapy    pertuzumab (Perjeta) 840 mg in sodium chloride 0.9 % 250 mL IVPB, Intravenous, 4 of 6 cycles  trastuzumab (Herceptin) 593 mg in sodium chloride 0.9 % 250 mL IVPB, Intravenous, 4 of 6 cycles     11/18/2019 Initial Diagnosis    Malignant neoplasm of upper-outer quadrant of left breast in female, estrogen receptor positive (HCC)      [place] INTERVAL HISTORY  Yolanda Louise RobJaylan Bowers improved/well, but still dealing with challenges. She has found counseling to be very helpful. She still is quite fatigued and unfortunately has had cardiac complications (VT, ablation, medication) despite cardiology following her and  Discontinuation of trstuz/pertuz for lower ejection fraction.  She continues to follow with cardio-oncology and has been started on cardioprotective medications. She has some dizzy spells, no focal neuro deficits, and does not think the dizzyness is related to palpitations, but she's not sure. She was heading out on a trip with her sons when she was called back based on  the holter results for admission and ablation. They tried metoprolol but she had a rash. She thinks that was due to contrast not the metoprolol.The EF drop is likely from treatment but the arrhythmias may have a different etiology.    She has occasional lightheaded and dizziness.  She notes occasional headaches that are not new or  different.    She now endorses rare hot flashes, approximately only one a week with use of Effexor.  She has not been doing acupuncture and feels that she is able to manage her side effects on her own with exercise.  She is working but had to take time off for her fatigue. She switched jobs, has easier teaching work, but it's still arduous to be on all day.   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  This does not affect her IADLs but does have some impact on her dexterity.  She denies any to the toes.  She notes that this neuropathy is more tingly than previously.    CT shows resolution/stability of pulm nodules.  She denies any fevers chills or recent antibiotic use.  She does not feel like that she is hydrating as well as she probably should be.  She endorses occasional reflux that responds to Tums or famotidine.  She denies any chest wall or breast concerns today.      REVIEW OF SYSTEMS  A complete review of systems was conducted and is negative in detail, except as noted in the interval history.    PAST MEDICAL HISTORY  Patient Active Problem List   Diagnosis    Macromastia    Malignant neoplasm of upper-outer quadrant of left breast in female, estrogen receptor positive ([place])    CIN I (cervical intraepithelial neoplasia I)    Chemotherapy induced cardiomyopathy ([place])    PVC's (premature ventricular contractions)       PAST SURGICAL HISTORY  Past Surgical History:   Procedure Laterality Date    BREAST BIOPSY Right 11/2018    2 core bx      BREAST RECONSTRUCTION Right 06/25/2019    reduction - Dr. Lowella Dell    PR MASTECTOMY SIMPLE COMPLETE Left 06/25/2019    Had radiation        ALLERGIES  Review of patient's allergies indicates:  Allergies   Allergen Reactions    Oxycodone Skin: Itching    Codeine GG:YIRSWN/IOEVOJJK    Metoprolol Skin: Rash     rash    Walnut Skin: Rash    Paclitaxel Skin: Hives        MEDICATIONS    Current Outpatient Medications:     acetaminophen 500 MG  tablet, Take 1-2 tabs by mouth once daily as needed for pain., Disp: , Rfl:     Calcium Citrate-Vitamin D (CITRACAL + D OR), Take 1 tablet by mouth daily., Disp: , Rfl:     carVEDilol 3.125 MG tablet, Take 3.125 mg by mouth 2 times a day. Take 1 full tab in AM and 79fll tab in PM, Disp: , Rfl:     cetirizine 10 MG tablet, Take 10 mg by mouth daily., Disp: , Rfl:     famotidine 20 MG tablet, Take 1 tablet (20 mg) by mouth daily as needed for indigestion/heartburn., Disp: , Rfl:     letrozole 2.5 MG tablet, Take 1 tablet (2.5 mg) by mouth daily., Disp: 90 tablet, Rfl: 3    lisinopril 2.5 MG tablet, Take 1 tablet (2.5 mg) by mouth 2 times a day for  7 days., Disp: 14 tablet, Rfl: 0    lisinopril 2.5 MG tablet, Take 1 tablet (2.5 mg) by mouth 2 times a day. (Patient taking differently: Take 2.5 mg by mouth 2 times a day. Taking 1 tab in AM and 1 tab in PM), Disp: 180 tablet, Rfl: 3    venlafaxine ER 75 MG 24 hr capsule, Take 1 capsule (75 mg) by mouth every morning., Disp: 90 capsule, Rfl: 0     SOCIAL HISTORY:   Social History     Tobacco Use    Smoking status: Never Smoker    Smokeless tobacco: Never Used   Substance Use Topics    Alcohol use: Not Currently    Drug use: Not Currently      Her husband is a guide, and not engaged with helping her or her sons. He's back to work, but returned quite ill.   Her older son is very helpful to her and her younger son. Yolanda Bowers, the oldest is back at college but nearby and able to return home if needed.    FAMILY HISTORY:    Cancer-related family history includes Ovarian Cancer (age of onset: 74) in her paternal aunt; Skin Cancer in her maternal grandfather and mother.   Negative genetic panel testing, 11/2018.     PHYSICAL EXAMINATION   VITAL SIGNS: BP 99/70    Pulse 69    Temp 36.5 C (Oral)    Resp 16    Wt 66.3 kg (146 lb 2.6 oz)    SpO2 97%    BMI 23.59 kg/m       GENERAL: Well appearing, well developed female in no acute distress.  HEENT: Normocephalic, atraumatic.  Sclerae anicteric. EOMI   NECK: Neck is supple without any appreciable lymphadenopathy or thyromegaly.  LUNGS: Respirations regular and unlabored. Clear to auscultation bilaterally.  CARDIAC: Sinus bradycardic. No murmurs, gallops, or rubs.   BREASTS: S/p left mastectomy. Hyperpigmentation to left chest wall, consistent with post-radiation changes. No concerning nodules or skin changes. Right breast without palpable nodules, skin changes, or nipple changes. No axillary lymphadenopathy appreciated bilaterally.  Tenderness to palpation of bilateral axilla, unchanged.  ABDOMEN: Soft, non-tender, non-distended. Normoactive bowel sounds. No hepatosplenomegaly or masses appreciated.  EXTREMITIES: No edema, clubbing, or cyanosis.  SKIN: No visible rash, bruising, or petechiae.  MSK: Without muscle or joint deformities.  No spinal or hip tenderness to palpation.  NEUROLOGICAL: Alert and oriented. Mental status appropriate.   CNii-Xii in tact. Gait and motor normal  NUTRITIONAL STATUS: Adequate.      ASSESSMENT & PLAN  Yolanda Bowers is a 49 year old female with history of cT3N3 left breast invasive ductal carcinoma, ER/PR/HER2+, s/p neoadjuvant chemotherapy with ddAC-THP, left mastectomy and SLN biopsy, and adjuvant radiation therapy. She had a complete pathologic response at the time of surgery. Presents to clinic today for labs and follow up while on letrozole with ongoing cardiac issues and dizzyness.    1. Malignant neoplasm of upper-outer quadrant of left breast in female, estrogen receptor positive ([place])  Kammi is doing well overall today from a breast cancer perspective without evidence of recurrence based on review of systems and clinical exam.  She is tolerating letrozole well with manageable side effects of joint aches and now rare hot flashes.  Consider acupuncture in the future if needed.  Praised patient for her focus on activity as tolerated.  We discussed a gradual exercise regimen.  Patient continues  have a port in place and we discussed  discontinuing Herceptin and Perjeta given her drop in ejection fraction; this will be 1 dose shy of 17.  Continue to follow with cardiology.   Her dizzyness I fear is likely to either more arrythmias or her current meds.  She will reach out to cardiology to discuss retry of metoprolol, as this may be better tolerated and she does not think she's allergic.  We discussed MRI brain to r/o mets as a cause of her dizzyness, but that this is unlikely, given p CR, and lack of other focal findings.  Port can be removed or remain in place  We are happy to help with rehab  - Continue letrozole daily.   - Right Mammogram normal, next due 12/2020.   - L  DEXA done 11/06/19 shows osteopenia, next due 10/2021.   - Continue to follow with cardio oncology.    2. Pulmonary nodules   Per CT 11/06/19, stable 3 mm nodule since 02/21/19, recommend repeat chest CT in 12 months to establish 2-year stability. New 1.9cm round glass nodule in the LUL, likely postradiation pneumonitis. Stable resolved, now will cease to follow.     3. Osteopenia, unspecified location  DEXA 3/16 w/ osteopenia. Monitor q2 years while on endocrine therapy. Continue calcium and vitamin D.    4. Hot flashes  Currently tolerable with Effexor. Continue and let team know if worsens.      5. Depressed mood  - Followed by MSW , psych and involved in support group. She has greatly appreciated and benefited from support. She's setting boundaries with home challenges, and doing much better. She may need more time off from work- intermittent leave to build back stamina, and to deal with cardiac issues.  She is mildy anemic and in menopause by labs. She'll see nutrition and likely will recover post chemo from anemia.    DISPOSITION  Patient will return in 8 weeks for labs and toxicity assessment. Encouraged to call in the interim with any new symptoms.

## 2020-05-11 ENCOUNTER — Other Ambulatory Visit (HOSPITAL_BASED_OUTPATIENT_CLINIC_OR_DEPARTMENT_OTHER): Payer: Self-pay

## 2020-05-13 ENCOUNTER — Other Ambulatory Visit: Payer: Self-pay

## 2020-05-13 ENCOUNTER — Ambulatory Visit: Payer: Commercial Managed Care - PPO | Attending: Physician Assistant

## 2020-05-13 DIAGNOSIS — Z170 Estrogen receptor positive status [ER+]: Secondary | ICD-10-CM | POA: Insufficient documentation

## 2020-05-13 DIAGNOSIS — C50412 Malignant neoplasm of upper-outer quadrant of left female breast: Secondary | ICD-10-CM | POA: Insufficient documentation

## 2020-05-13 LAB — FOLATE & VITAMIN B12
Folate, SRM: >22 ng/mL (ref >5.8)
Vitamin B12 (Cobalamin): 307 pg/mL (ref 180–914)

## 2020-05-13 LAB — RETICULOCYTE COUNT
Absolute Reticulocyte Count: 48 10*9/L (ref 25–125)
Reticulocyte Count, %: 1.2 % (ref 0.5–2.5)

## 2020-05-13 LAB — IRON BINDING CAPACITY (W/IRON, TRANSFERRIN & TRANSF SAT)
Iron, SRM: 96 ug/dL (ref 31–171)
Total Iron Binding Capacity: 389 ug/dL (ref 270–535)
Transferrin Saturation: 25 % (ref 10–45)
Transferrin: 278 mg/dL (ref 192–382)

## 2020-05-14 ENCOUNTER — Ambulatory Visit: Payer: Commercial Managed Care - PPO | Attending: Cardiovascular Disease | Admitting: Cardiovascular Disease

## 2020-05-14 ENCOUNTER — Encounter (HOSPITAL_BASED_OUTPATIENT_CLINIC_OR_DEPARTMENT_OTHER): Payer: Self-pay | Admitting: Cardiovascular Disease

## 2020-05-14 ENCOUNTER — Ambulatory Visit (HOSPITAL_BASED_OUTPATIENT_CLINIC_OR_DEPARTMENT_OTHER): Payer: Commercial Managed Care - PPO | Admitting: Cardiovascular Disease

## 2020-05-14 DIAGNOSIS — I429 Cardiomyopathy, unspecified: Secondary | ICD-10-CM | POA: Insufficient documentation

## 2020-05-14 MED ORDER — METOPROLOL SUCCINATE ER 25 MG OR TB24
12.5000 mg | EXTENDED_RELEASE_TABLET | Freq: Every day | ORAL | 3 refills | Status: DC
Start: 2020-05-14 — End: 2020-07-20

## 2020-05-14 MED ORDER — VALSARTAN 40 MG OR TABS
20.0000 mg | ORAL_TABLET | Freq: Two times a day (BID) | ORAL | 3 refills | Status: DC
Start: 2020-05-14 — End: 2020-07-10

## 2020-05-14 NOTE — Progress Notes (Signed)
[facility] NOTE/TELEMEDICINE     Name:  Yolanda Bowers  Date of Birth: Jan 16, 1971  Medical Record Number: [id number]  Primary Care Physician:  Margreta Journey, MD  Referring Provider:  Teena Dunk, *  Date of Service: 05/14/2020    I conducted this encounter from [facility] via secure, live, face-to-face video conference with the patient. Yolanda Bowers was located at home.  Prior to the interview, the risks and benefits of telemedicine were discussed with the patient and verbal consent was obtained.      Time of visit:  235pm    ID  Yolanda Bowers is a 49 year old female with history of high-grade infiltrating ductal carcinoma of the left breast (ER+/PR+, FISH +) with angiolymphatic invasion, history of neoadjuvant chemotherapy with ddAC-THP, left mastectomy with lymph node biopsy, received 4 or 6 cycles of herceptin and Perjeta due to drop in EF, recent PVC ablation, here for follow-up.    She is back to work, feel exhausted.  She had to go back to work for financial reasons.  She is tired when she gets home.  She is constantly dizzy and lightheaded with notable dizziness with position.  blood pressure at home have been in the 99/62 range.  Feeling nauseated as well.      She had an episode when returning to work of feeling flushed and fluttering without syncope.  When she checked her heart rate, it was in the 100 range.  She also has some confusion at time.      No lower extremity edema, but occasional abdominal distention.  Occasional constipation.       Patient Active Problem List    Diagnosis Date Noted   . PVC's (premature ventricular contractions) [I49.3] 02/18/2020     Ablation on 03/21/2020:   Successful ablation for PVCs from the interleaflet trigone in the LCC/RCC commisure below the aortic valve     . Chemotherapy induced cardiomyopathy ([place]) [I42.7, T45.1X5A] 01/19/2020   . Malignant neoplasm of upper-outer quadrant of left breast in female, estrogen receptor positive ([place]) [C50.412,  Z17.0] 11/18/2019   . Macromastia [N62] 08/31/2019   . CIN I (cervical intraepithelial neoplasia I) [N87.0] 06/01/2018     Review of patient's allergies indicates:  Allergies   Allergen Reactions   . Oxycodone Skin: Itching   . Codeine YI:RSWNIO/EVOJJKKX   . Metoprolol Skin: Rash     rash   . Walnut Skin: Rash   . Paclitaxel Skin: Hives     Current Outpatient Medications   Medication Sig Dispense Refill   . acetaminophen 500 MG tablet Take 1-2 tabs by mouth once daily as needed for pain.     . Calcium Citrate-Vitamin D (CITRACAL + D OR) Take 1 tablet by mouth daily.     . carVEDilol 3.125 MG tablet Take 3.125 mg by mouth 2 times a day. Take 1 full tab in AM and 45full tab in PM     . cetirizine 10 MG tablet Take 10 mg by mouth daily.     . famotidine 20 MG tablet Take 1 tablet (20 mg) by mouth daily as needed for indigestion/heartburn.     . letrozole 2.5 MG tablet Take 1 tablet (2.5 mg) by mouth daily. 90 tablet 3   . lisinopril 2.5 MG tablet Take 1 tablet (2.5 mg) by mouth 2 times a day for 7 days. 14 tablet 0   . lisinopril 2.5 MG tablet Take 1 tablet (2.5 mg) by mouth 2 times a  day. (Patient taking differently: Take 2.5 mg by mouth 2 times a day. Taking 1 tab in AM and 1 tab in PM) 180 tablet 3   . venlafaxine ER 75 MG 24 hr capsule Take 1 capsule (75 mg) by mouth every morning. 90 capsule 0     No current facility-administered medications for this visit.        Review of Systems:  A complete review of systems was otherwise negative except as noted above.   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      blood pressure  112/68, heart rate 77  108/73, heart rate 89    General: well developed female  in no acute distress  Eyes:  Anicteric sclera, PERRLA  Neck: No masses, normal carotid pulses without audible bruit, thyroid gland is normal in size  Chest:  Lungs are clear to auscultation without wheezes, rales, or rhonchi  Cardiovascular: regular rate and rhythm, normal S1 and S2, no S3 or S4, 1/6 holosystolic murmur, PMI felt midclavicular, JVP of 5-6 cm  Abdomen: soft non-tender, non-distended with normal bowel sounds, liver span felt at the costal margin, no splenomegaly  Extremities: warm to touch bilaterally with +2 pulses in the femoral, popliteal, tibial, dorsalis pedis bilaterally , no edema bilaterally  Musculoskeletal:  Back is symmetric, no curvature, no tenderness.  Skin:  Normal skin color, texture.  No rashes or lesions  Neurologic:  Gait is normal, sensation grossly normal  Psychiatric:  Appropriate and responds to questions    Laboratory Results:   Results for orders placed or performed in visit on 05/13/20   Folate and Vitamin B12   Result Value Ref Range    Folate, SRM >22.0 >5.8 ng/mL    Vitamin B12 (Cobalamin) 307 180 - 914 pg/mL   Iron Binding Capacity and Total Iron   Result Value Ref Range    Transferrin 278 192 - 382 mg/dL    Iron, SRM 96 31 - 171 ug/dL    Total Iron Binding Capacity 389 270 - 535 ug/dL    Transferrin Saturation 25 10 - 45 %   Reticulocyte Count   Result Value Ref Range    Reticulocyte Count, % 1.2 0.5 - 2.5 %    Absolute Reticulocyte Count 48 25 - 125 10*9/L    RETIC HEMOGLOBIN EQUIVALENT Not reported 28.0 - 38.0 pg     WBC   Date Value Ref Range Status   12/20/2019 3.43 (L) 4.3 - 10.0 10*3/uL Final   11/06/2019 2.73 (L) 4.3 - 10.0 10*3/uL Final   09/24/2019 2.04 (L) 4.3 - 10.0 10*3/uL Final     Results for orders placed or performed in visit on 01/30/20   Lipid Panel   Result Value Ref Range    Cholesterol  (Total) 165 <200 mg/dL    Triglyceride 106 <150 mg/dL    Cholesterol (HDL) 63 >39 mg/dL    Cholesterol (LDL) 81 <130 mg/dL  Non-HDL Cholesterol 102 0 - 159 mg/dL    Cholesterol/HDL Ratio 2.6     Lipid Panel, Additional Info. (NOTE)      Labs discussed with patient and provided in after visit summary.     Imaging:  Echo: 03/28/2019:  left ventricular end-diastolic dimension of 3.8BO, EF of 60%, strain -20%, trabeculated LV, normal valves, mild LAE, normal diastology    echocardiogram from 08/14/2019:  left ventricular end-diastolic dimension of 1.7PZ, EF of 59%, GLS -20%, no change from 03/28/2019    Echocardiogram from 01/02/2020:   left ventricular end-diastolic dimension of 0.2HE, EF of 51%, GLS -13%, normal RV function, normal valves, decreased function compared to 08/14/2019    EKG:01/11/2020:  Normal sinus rhythm, heart rate of 74 bpm, normal PR interval, premature ventricular contractions, LAE    cMRI from 7/27:  1. The left ventricle (LV) is mildly dilated (LVEDVi= 94 ml/m2) with low normal systolic function (LVEF= 52%). No regional wall motion abnormalities. There is no LV hypertrophy (LVmassI= 45 gm/m2).    2. The right ventricle (RV) is normal in size (RVEDVi= 83 ml/m2) with normal systolic function (RVEF= 77%).     3. On delayed enhancement imaging there is intramyocardial late gadolinium enhancement at the inferior RV attachment site.    4. On limited T2-weighted imaging there is no evidence of myocardial edema.    5. On T1-mapping, the native T1 value is  1069ms (normal).    6. No significant valvular abnormalities.    7. Left atrial area measures 25cm2 and LAVI= 28mL/m2 (dilated) via biplanar method; Right atrial area measures 14cm2 via 4-chamber monoplanar method.    Assessment:   49 year old female with history of high-grade infiltrating ductal carcinoma of the left breast (ER+/PR+, FISH +) with angiolymphatic invasion, history of neoadjuvant chemotherapy with ddAC-THP, left mastectomy with lymph  node biopsy, received Herceptin and Perjeta, which was discontinued after persistent decline in EF.  She completed 4 of 6 cycles.  She has not had any more palpitations since her PVC ablation, but continues to have postural symptoms likely from volume depletion.  I am unable to locate her neck veins today.  She was encouraged to increase fluid intake or increase salt when she has postural symptoms.     She is unable to uptitrate carvedilol dosing due to blood pressure.  She wants to try metoprolol again thinking the rash was due to her procedure and sedation.  I am willing to try again.  If she does have a rash on metoprolol, we can switch to bisoprolol.  cardiopulmonary exercise test will be useful to assess functional capacity.   She is having dry cough.  Will switch lisinopril to valsartan.    Plan:  1.  Switch lisinopril to valsartan 20mg  BID.  She will measure blood pressure.  Maintain blood pressure >90/70  2.  Stop carvedilol and try toprol XL 12.5mg  daily again to minimize blood pressure drop and also help with arrhythmias.  3.  cardiopulmonary exercise test to assess functional capacity  4.  Patient was instructed to discuss with her oncologist regarding the recent drop in WBC and covid booster shot in the setting of leukopenia and teaching face-to-face    Follow-up:  Follow-up in 3 months.  Patient will call sooner if symptoms change.    Total time of >25 minutes was spent face-to-face with the patient, of which more than 50% was spent counseling and coordinating care.    Juanetta Beets, MD  Division of Cardiology  Advanced Heart Failure/Cardiac Transplantation  05/14/2020

## 2020-05-15 ENCOUNTER — Encounter (HOSPITAL_BASED_OUTPATIENT_CLINIC_OR_DEPARTMENT_OTHER): Payer: Self-pay | Admitting: Medical Oncology

## 2020-05-19 ENCOUNTER — Encounter (HOSPITAL_BASED_OUTPATIENT_CLINIC_OR_DEPARTMENT_OTHER): Payer: Self-pay

## 2020-05-20 ENCOUNTER — Other Ambulatory Visit: Payer: Self-pay

## 2020-05-21 ENCOUNTER — Ambulatory Visit (HOSPITAL_BASED_OUTPATIENT_CLINIC_OR_DEPARTMENT_OTHER): Payer: Commercial Managed Care - PPO

## 2020-05-21 ENCOUNTER — Ambulatory Visit
Admit: 2020-05-21 | Discharge: 2020-05-21 | Disposition: A | Discharge status: Home / Self Care | Payer: Commercial Managed Care - PPO | Attending: Diagnostic Radiology | Admitting: Diagnostic Radiology

## 2020-05-21 DIAGNOSIS — C50412 Malignant neoplasm of upper-outer quadrant of left female breast: Secondary | ICD-10-CM | POA: Insufficient documentation

## 2020-05-21 DIAGNOSIS — R42 Dizziness and giddiness: Secondary | ICD-10-CM | POA: Insufficient documentation

## 2020-05-21 DIAGNOSIS — Z170 Estrogen receptor positive status [ER+]: Secondary | ICD-10-CM | POA: Insufficient documentation

## 2020-05-21 MED ORDER — GADOTERIDOL 279.3 MG/ML IV SOLN
15.0000 mL | Freq: Once | INTRAVENOUS | Status: AC | PRN
Start: 2020-05-21 — End: 2020-05-21
  Administered 2020-05-21: 7.5 mmol via INTRAVENOUS

## 2020-05-21 MED ORDER — LIDOCAINE HCL (PF) 1 % IJ SOLN
0.5000 mL | Freq: Once | INTRAMUSCULAR | Status: AC
Start: 2020-05-21 — End: 2020-05-21
  Administered 2020-05-21: 0.5 mL via INTRADERMAL

## 2020-05-27 ENCOUNTER — Encounter (HOSPITAL_BASED_OUTPATIENT_CLINIC_OR_DEPARTMENT_OTHER): Payer: Self-pay

## 2020-05-27 ENCOUNTER — Ambulatory Visit: Payer: Commercial Managed Care - PPO | Attending: Psychiatry | Admitting: Psychiatry

## 2020-05-27 ENCOUNTER — Telehealth (HOSPITAL_BASED_OUTPATIENT_CLINIC_OR_DEPARTMENT_OTHER): Payer: Self-pay | Admitting: Cardiovascular Disease

## 2020-05-27 DIAGNOSIS — R232 Flushing: Secondary | ICD-10-CM | POA: Insufficient documentation

## 2020-05-27 DIAGNOSIS — F3341 Major depressive disorder, recurrent, in partial remission: Secondary | ICD-10-CM | POA: Insufficient documentation

## 2020-05-27 DIAGNOSIS — F4322 Adjustment disorder with anxiety: Secondary | ICD-10-CM | POA: Insufficient documentation

## 2020-05-27 DIAGNOSIS — R4189 Other symptoms and signs involving cognitive functions and awareness: Secondary | ICD-10-CM | POA: Insufficient documentation

## 2020-05-27 DIAGNOSIS — C50412 Malignant neoplasm of upper-outer quadrant of left female breast: Secondary | ICD-10-CM | POA: Insufficient documentation

## 2020-05-27 MED ORDER — VENLAFAXINE HCL ER 75 MG OR CP24
75.0000 mg | EXTENDED_RELEASE_CAPSULE | Freq: Every morning | ORAL | 0 refills | Status: DC
Start: 2020-05-27 — End: 2023-02-14

## 2020-05-27 NOTE — Progress Notes (Signed)
Distant Site Telemedicine Encounter    I conducted this encounter from [facility] via secure, live, face-to-face video conference with the patient. Jalana was located at home in [place], [place], without others present.  Prior to the interview, the risks and benefits of telemedicine were discussed with the patient and verbal consent was obtained.      [facility] PSYCHIATRY FOLLOW-UP NOTE   ID: Wednesday Yolanda Bowers is a 49 year old female with cT3N3 left breast invasive ductal carcinoma, ER/PR/HER2+ (dx 11/2018), s/p neoadjuvant THP and L mastectomy (06/2019) and adjuvant radiation, now on adjuvant trastuzumab-pertuzumab (currently held for cardiac concerns) and letrozole, returning for psychiatric follow-up of depression and anxiety.    Chief Complaint: Anxiety, Medication Management, and Depression    INTERVAL HX:   Last seen 8/24; venlafaxine continued at 75 mg given benefit. Discussed ongoing [place] care through [facility]. Interim records reviewed, includingn oncology, cardiology, imaging, [facility] (brief psychotherapy). She had lisinopril switched to valsartan for cough and carvedilol switched to metoprolol given hypotension. She has had lots of interval events, "mostly good," since our last meeting.    Mood: She feels more positive about work environment. She feels able to navigate boundaries.  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  No guilt, which is a significant improvement since starting treatment.  She denies passive death wish or suicidality.    Anxiety: Mild, and significantly improved since beginning treatment.  Some increased stress with home responsibilities, but she has relief about cancer status. We discussed fears of recurrence; encouraged her to seek guidance/parameters about when to ask teams about symptoms. She notes fidgeting a few days; attributes this to less physical activity.    Fatigue: Improved now; first few weeks of school  were really hard. She also notes improvement since switching back to metoprolol and stopping lisinopril. In the past 6 days, she has seen improvements. She was able to work a full 5-day week. She was exhausted after MRI brain and COVID booster on the same day. She is asking Tressie Ellis for more help; she needs support at home. She is working with Dr. Charna Archer on workup for anemia and leukopenia.    Sleep: Sleeping more, which helps. Going to bed 8:30-9 pm, sleeps all night, still needs naps. Worries this takes a toll on [place] being alone, so working on more quality time.    Explored needs and realistic asks, other sources of support. She also feels like she needs support during medical visits. A friend will fill this role. No concerns for physical safety. Discussed what she wants going forward. She is confident in current decisions. She is relieved about reassuring scans and would love to see cancer as a past-tense event. We discussed living with uncertainty and what steps she can take to help navigate this.  Revisited gains and progress she has made in this course of treatment.  She is confident she can transfer her venlafaxine to her primary care provider going forward.    Review of Systems  See interval history above.   She has vivid dreams; not bothersome, only a few disturbing dreams  She notes some low appetite    Current Outpatient Medications   Medication Sig Dispense Refill    acetaminophen 500 MG tablet Take 1-2 tabs by mouth once daily as needed for pain.      Calcium Citrate-Vitamin D (CITRACAL + D OR) Take 1 tablet by mouth daily.  cetirizine 10 MG tablet Take 10 mg by mouth daily.      famotidine 20 MG tablet Take 1 tablet (20 mg) by mouth daily as needed for indigestion/heartburn.      letrozole 2.5 MG tablet Take 1 tablet (2.5 mg) by mouth daily. 90 tablet 3    metoprolol succinate ER 25 MG 24 hr tablet Take 0.5 tablets (12.5 mg) by mouth daily. Do not chew or crush. 15 tablet 3    valsartan 40 MG  tablet Take 0.5 tablets (20 mg) by mouth 2 times a day. 90 tablet 3    venlafaxine ER 75 MG 24 hr capsule Take 1 capsule (75 mg) by mouth every morning. 90 capsule 0     No current facility-administered medications for this visit.        MENTAL STATUS EXAM:   Appearance: Well dressed, well groomed. Alert, awake, in no apparent distress.  Behavior: Cooperative with good eye contact  Motor: No tremors or abnormal movements noted. Exam limited due to telehealth visit.  Speech: Normal rate, rhythm, and tone with appropriate volume    Mood:  "All right"  Affect: Mood congruent, euthymic, reactive, full range  Thought process: Linear, logical, goal-oriented     Thought content: No SI/HI/AVH  Insight: Good  Judgment: Good  Orientation: Oriented to self, place, situation   Attention: Good  Memory: Grossly intact; not formally tested    SCREENING TOOLS:  Initial Pathway PHQ-9 Score    Flowsheets    Last refreshed: 05/27/2020  3:49 PM: Depression Care Pathway Initial PHQ-9 11         Current as of: 05/27/2020  3:49 PM          Recent PHQ-9 Scores     PHQ9 Date 11/22/2019 12/25/2019 02/01/2020 03/04/2020 04/15/2020 05/27/2020    PHQ9 Total Score '6 9 6 2 11 7        '$ GAD7 Total Scores       02/01/2020 03/04/2020 04/15/2020 05/27/2020    Total:  '1  1  4  4          '$ LABS/IMAGES/STUDIES: Data was reviewed and relevant information is discussed in assessment and plan.    BP at most recent encounter:    BP Readings from Last 1 Encounters:   05/07/20 99/70     SAFETY ASSESSMENT:  Predisposing risk factors: Oncology history, Depression, Anxiety and trauma history with chronic intermittent thoughts of death                     Dynamic risk factors: Depression, Anxiety; cardiac and medical stressors                        Protective risk factors: Support from medical and mental health teams, moral prohibition to suicide, adaptive coping skills, high level of function, resilient, identity as mother, denies access to guns or weapons, improved  mood/anxiety                          Imminence: Patient denies any current intent or plans of suicide or self-harm.                         Overall risk: Low risk                            Justification for level of  care: Outpatient care given ability to maintain function and present to treatment                Interventions employed: Medication management  , Supportive therapy and Psychoeducation      ASSESSMENT:  Yolanda Bowers is a 49 year old female with cT3N3 left breast invasive ductal carcinoma, ER/PR/HER2+ (dx 11/2018), s/p neoadjuvant THP and L mastectomy (06/2019) and adjuvant radiation, now on adjuvant trastuzumab and pertuzumab, evaluated by [facility] psychiatry for concern of depression from her oncology team.  Her initial presentation was consistent with recurrent major depressive disorder, as well as adjustment disorder with anxiety.  She reports mild cognitive changes that may represent cancer-related cognitive dysfunction, and/or impacts of anxiety and depression.  Her initial PHQ 9 and GAD 7 scores underestimated her symptom burden at presentation, such that scores today reflect less improvement than she has experienced.  She had improved mood/anxiety, irritability, and hot flashes with venlafaxine, but had inadvertently been using BID, which may have driven some restlessness and early morning awakenings. In May, we consolidated to AM dosing.    Christana has continued to report sustained improvement in anxiety, mood, and irritability with venlafaxine and supportive therapy. She is navigating cardiac toxicity related to HER-2 targeted therapies, and we identified this, as well as anemia and leukopenia, as likely contributors to fatigue, as her depression has now significantly improved.  She has made major strides in starting a new job and continuing to manage her household, despite these challenges.  Given her improvements, achieving her goals of consultation at [facility] psychiatry, and her transition to  survivorship/surveillance status with her breast cancer, this will be our final meeting, and she has a plan to transfer her venlafaxine to her [facility] primary care physician going forward.  It has been a privilege to take part in Maizee's care, and I am happy to serve as a resource if questions arise in the future.    DIAGNOSES & PLAN:   1. Episode of recurrent major depressive disorder, in partial remission (HCC)  - venlafaxine ER 75 MG 24 hr capsule; Take 1 capsule (75 mg) by mouth every morning.  Refill provided through [facility] order pharmacy. Further fills through PCP  - Continue behavioral activation, noting physical limitations and need for rest  - Encourage pursuing intermittent leave from work for medical conditions if needed.  She is working with Dr. [name]  - Continue rehab/PT    2. Adjustment disorder with anxious mood, with GAD traits  - venlafaxine ER 75 MG 24 hr capsule; Take 1 capsule (75 mg) by mouth every morning.  - Continue therapy through [facility]/SW at [facility] as available    3. Hot flashes- improved during treatment  - venlafaxine ER 75 MG 24 hr capsule; Take 1 capsule (75 mg) by mouth every morning.   - Acupuncture if helpful    4. Cognitive changes- mild  - Continue to monitor; not impairing    5. Transition of care  - She will contact [facility] PCP for further venlafaxine refills  - Return to [facility] psychiatry only as needed for new cancer-related issues  - Provided sample taper schedule for venlafaxine; advise continuing venlafaxine for another 6 months and tapering under PCP guidance if desired. Discussed long-term risks of use, including sexual SE, fracture risk; weighed against risk of recurrent depression/anxiety.    Medical considerations:  - Do not expect venlafaxine further contributing to decrease in LVEF  - Venlafaxine has very low risks of cytopenias; continue  to follow-up anemia/leukopenia with Dr. Helene Shoe  - Encourage her to ask Dr. Alto [place] team for parameters about contacting clinic  for physical symptoms while in survivorship  - BP readings have been low to normal; no HTN, reassuring while on venlafaxine; monitoring through cardiology  - ECG 03/24/20 notable for QTc 455, reassuring while on SNRI    I have discussed the treatment plan with the patient. The patient confirms understanding the risks and benefits of treatment, the alternatives, and has consented to the plan as outlined above.     THERAPY:   Time: 40 minutes  Techniques: Supportive Therapy and Insight-oriented Therapy  Themes:  Support and reassurance, Normalization and validation of feelings, Coping with diagnosis, Psychosocial stressors, Tolerating uncertainty, Depression, Coping skills and Behavioral strategies  Plan:  - Transition to PCP.    I have discussed the treatment plan with the patient. The patient confirms understanding the risks and benefits of treatment, the alternatives, and has consented to the plan as outlined above.     Thereasa Parkin, MD

## 2020-05-27 NOTE — Telephone Encounter (Addendum)
S: Received VM from patient regarding scheduling appointment with Dr. Merlene Pulling  B: Pt with history of high-grade infiltrating ductal carcinoma of the left breast (ER+/PR+, FISH +) with angiolymphatic invasion, history of neoadjuvant chemotherapy with ddAC-THP, left mastectomy with lymph node biopsy, received Herceptin and Perjeta, which was discontinued after persistent decline in EF. Last visit with Dr. [name] 05/14/2020    Plan:   Switch lisinopril to valsartan 20mg  BID.  She will measure blood pressure.  Maintain blood pressure >90/70  2. Stop carvedilol and try toprol XL 12.5mg  daily again to minimize blood pressure drop and also help with arrhythmias.  3. cardiopulmonary exercise test to assess functional capacity  FU in 3 months    A: Pt has follow up clinic visits with Dr. [name] 06/24/20 and 07/29/20, needs to schedule cardiopulmonary exercise test     R: Left VM

## 2020-05-27 NOTE — Patient Instructions (Signed)
Abagayle, it has been such a privilege to work with you and see you make progress toward your goals.    - I advise that you continue venlafaxine for another 6 months at your current dose, given improvements in mood, anxiety, and energy. If you still have very little depression/anxiety at that point and want to taper, here is a schedule to try with your primary care doctor:   - Weeks 1-2: Alternate between 75 mg and 37.5 mg (you'll need 37.5 mg capsules, as you can't split the 75 mg dose)    (i.e., Day 1: 75 mg, Day 2: 37.5 mg, Day 3: 75 mg, Day 4: 37.5 mg, and so on)   - Weeks 3-4: 37.5 mg each morning   - Weeks 5-6: Alternate between 37.5 mg and 0 mg (nothing) each morning    - Week 7: STOP   - You can absolutely slow down the speed of this taper if you're noticing side effects with dose decreases, or stay at each dose level for just a week if you're noticing no side effects and want to taper faster.    - Continue the excellent work you've done to build supports, challenge anxious thoughts, and make space for your needs and priorities. I saw that you were able to schedule recently with a social worker for support and advise continuing this, if helpful.    - I encourage your plan to ask Dr. Charna Archer which physical symptoms you should contact her office about. Hopefully this will help give you some clarity, reassurance, and empowerment to advocate when you are concerned.    - You are welcome to reach out by MyChart if you have questions, or if new cancer-related mental health questions arise.    Genuinely, I wish you the best (you deserve that!). Thank you for letting me be part of your care team.    [place],  Calton Golds, MD

## 2020-05-29 ENCOUNTER — Other Ambulatory Visit: Payer: Self-pay

## 2020-05-29 ENCOUNTER — Telehealth (HOSPITAL_BASED_OUTPATIENT_CLINIC_OR_DEPARTMENT_OTHER): Payer: Self-pay

## 2020-05-29 DIAGNOSIS — Z7189 Other specified counseling: Secondary | ICD-10-CM

## 2020-05-29 NOTE — Progress Notes (Signed)
Spiritual Health Visit     Encounter Details  . Patient Name (if different from chart):    Marland Kitchen Patient Name: Yolanda Bowers  . Referral From: (P) Screening Questionnaire  . Requested by: (P) Protocol  . Form of Contact: (P) Voicemail  . Present at Encounter:    Marland Kitchen Visit Type:   (P) Attempted (Outpatient)  . Location: (P) [facility]      (P) [facility]  . Spiritual Preference:    . Psycho/Social History:        Narrative of Spiritual Encounter  I left a voicemail offering support     Plan of Care  . Continue Visiting: (P) No further needs at this time    Visit Duration  . Direct Time: 5  . Indirect Time: 15

## 2020-05-30 ENCOUNTER — Ambulatory Visit
Admission: RE | Admit: 2020-05-30 | Discharge: 2020-05-30 | Disposition: A | Discharge status: Home / Self Care | Payer: Commercial Managed Care - PPO | Attending: Critical Care Medicine | Admitting: Critical Care Medicine

## 2020-05-30 DIAGNOSIS — I429 Cardiomyopathy, unspecified: Secondary | ICD-10-CM

## 2020-05-30 LAB — PULMONARY FUNCTION TESTING
EXPTIME-Pre: 7.75 s
FEF25-75-%Pred-Pre: 91 %
FEF25-75-LLN: 1.57 L/sec
FEF25-75-Pre: 2.54 L/sec
FEF25-75-Pred: 2.79 L/sec
FEV1-%Pred-Pre: 97 %
FEV1-LLN: 2.23 L
FEV1-Pre: 2.78 L
FEV1-Pred: 2.86 L
FEV1/FVC-%Pred-Pre: 96 %
FEV1/FVC-LLN: 69 %
FEV1/FVC-Pre: 78 %
FEV1/FVC-Pred: 80 %
FVC-%Pred-Pre: 99 %
FVC-LLN: 2.79 L
FVC-Pre: 3.57 L
FVC-Pred: 3.57 L
MVV-%Pred-Pre: 101 %
MVV-LLN: 82 L/min
MVV-Pre: 101 L/min
MVV-Pred: 99 L/min
PEF-Pre: 316 L/min

## 2020-06-22 ENCOUNTER — Other Ambulatory Visit: Payer: Self-pay

## 2020-06-23 NOTE — Progress Notes (Signed)
[place] NOTE/TELEMEDICINE     Name:  Yolanda Bowers  Date of Birth: 04-10-1971  Medical Record Number: [id number]  Primary Care Physician:  Margreta Journey, MD  Referring Provider:  Teena Dunk, *  Date of Service: 06/24/2020    I conducted this encounter from [facility] via secure, live, face-to-face video conference with the patient. Yolanda Bowers was located at home.  Prior to the interview, the risks and benefits of telemedicine were discussed with the patient and verbal consent was obtained.      Date of Clinic:  Time of visit:  230pm    ID  Yolanda Bowers is a 49 year old female with history of high-grade infiltrating ductal carcinoma of the left breast (ER+/PR+, FISH +) with angiolymphatic invasion, history of neoadjuvant chemotherapy with ddAC-THP, left mastectomy with lymph node biopsy, received 4 or 6 cycles of herceptin and Perjeta due to drop in EF, history of PVC ablation on 03/21/2020, here for follow-up.    HISTORY:  She reports improvement in endurance since switching back to metoprolol and cough resolved with valsartan.    She completed her cardiopulmonary exercise test.  She reports challenged by breathing during that study.    She also notes more bloating.  No constipation.    Underwent chest CT-negative for recurrence.  Head MRI also negative for lesions.    BPs have been in the 120/66 with heart rate of 70 at home    She reports dyspnea as the cause of stopping her cardiopulmonary exercise test.      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] 01/19/2020    Malignant neoplasm of upper-outer quadrant of left breast in female, estrogen receptor positive ([place]) [C50.412, Z17.0] 11/18/2019    Macromastia  [N62] 08/31/2019    CIN I (cervical intraepithelial neoplasia I) [N87.0] 06/01/2018     Review of patient's allergies indicates:  Allergies   Allergen Reactions    Oxycodone Skin: Itching    Codeine VW:[id number]/[id number]    Lisinopril Resp: Cough    Walnut Skin: Rash    Paclitaxel Skin: Hives     Current Outpatient Medications   Medication Sig Dispense Refill    acetaminophen 500 MG tablet Take 1-2 tabs by mouth once daily as needed for pain.      Calcium Citrate-Vitamin D (CITRACAL + D OR) Take 1 tablet by mouth daily.      cetirizine 10 MG tablet Take 10 mg by mouth daily.      famotidine 20 MG tablet Take 1 tablet (20 mg) by mouth daily as needed for indigestion/heartburn.      letrozole 2.5 MG tablet Take 1 tablet (2.5 mg) by mouth daily. 90 tablet 3    metoprolol succinate ER 25 MG 24 hr tablet Take 0.5 tablets (12.5 mg) by mouth daily. Do not chew or crush. 15 tablet 3    valsartan 40 MG tablet Take 0.5 tablets (20 mg) by mouth 2 times a day. 90 tablet 3    venlafaxine ER 75 MG 24 hr capsule Take 1 capsule (75 mg) by mouth every morning. 90 capsule 0     No current facility-administered medications for this visit.  Review of Systems:  A complete review of systems was otherwise negative except as noted above.   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      Physical Exam:  Wt Readings from Last 3 Encounters:   05/07/20 66.3 kg (146 lb 2.6 oz)   04/10/20 68 kg (149 lb 14.6 oz)   03/28/20 67.6 kg (149 lb)     Telmed exam:  JVP at the clavicle    Laboratory Results:     Labs from 05/07/2020:  Na 138 K 4.2 Cl 104 CO2 30 BUN 15 Cr 0.76  WBC 2.58 Hct 34 Plt 178    Results for orders placed or performed during the hospital  encounter of 05/30/20   Pulmonary Function Testing   Result Value Ref Range    FVC-Pred 3.57 L    FVC-Pre 3.57 L    FVC-%Pred-Pre 99 %    FVC-LLN 2.79 L    FEV1-Pred 2.86 L    FEV1-Pre 2.78 L    FEV1-%Pred-Pre 97 %    FEV1-LLN 2.23 L    FEV1/FVC-Pred 80 %    FEV1/FVC-Pre 78 %    FEV1/FVC-%Pred-Pre 96 %    FEV1/FVC-LLN 69 %    FEF25-75-Pred 2.79 L/sec    FEF25-75-Pre 2.54 L/sec    FEF25-75-%Pred-Pre 91 %    FEF25-75-LLN 1.57 L/sec    EXPTIME-Pre 7.75 sec    MVV-Pred 99 L/min    MVV-Pre 101 L/min    MVV-%Pred-Pre 101 %    MVV-LLN 82 L/min    PEF-Pre 316.0 L/min    O2 During Test      Oximetry Baseline      Oximetry End of Test      Oximetry Lowest      Pulse Rate Baseline      Pulse Rate End of Test      Pulse Rate Highest      Total Distance Walked      Total Minutes Walked       Results for orders placed or performed in visit on 01/30/20   Lipid Panel   Result Value Ref Range    Cholesterol (Total) 165 <200 mg/dL    Triglyceride 106 <150 mg/dL    Cholesterol (HDL) 63 >39 mg/dL    Cholesterol (LDL) 81 <130 mg/dL    Non-HDL Cholesterol 102 0 - 159 mg/dL    Cholesterol/HDL Ratio 2.6     Lipid Panel, Additional Info. (NOTE)        Imaging:  Echo: 03/28/2019:  left ventricular end-diastolic dimension of 7.8HY, EF of 60%, strain -20%, trabeculated LV, normal valves, mild LAE, normal diastology    echocardiogram from 08/14/2019:  left ventricular end-diastolic dimension of 8.5OY, EF of 59%, GLS -20%, no change from 03/28/2019    Echocardiogram from 01/02/2020:   left ventricular end-diastolic dimension of 7.7AJ, EF of 51%, GLS -13%, normal RV function, normal valves, decreased function compared to 08/14/2019    EKG:01/11/2020:  Normal sinus rhythm, heart rate of 74 bpm, normal PR interval, premature ventricular contractions, LAE    Coronary angiogram from 03/20/2020:  normal    cMRI from 03/18/2020:  1. The left ventricle (LV) is mildly dilated (LVEDVi= 94 ml/m2) with low normal systolic function (LVEF=  53%). No regional wall  motion abnormalities. There is no LV hypertrophy (LVmassI= 45 gm/m2).    2. The right ventricle (RV) is normal in size (RVEDVi= 83 ml/m2) with normal systolic function (RVEF= 73%).     3. On delayed enhancement imaging there is intramyocardial late gadolinium enhancement at the inferior RV attachment site.    4. On limited T2-weighted imaging there is no evidence of myocardial edema.    5. On T1-mapping, the native T1 value is  1012ms (normal).    6. No significant valvular abnormalities.    7. Left atrial area measures 25cm2 and LAVI= 49mL/m2 (dilated) via biplanar method; Right atrial area measures 14cm2 via 4-chamber monoplanar method.    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      She had a recent cardiopulmonary exercise test showing cardiac limitation.  She is tolerating the valsartan/metoprolol.  Appears well compensated today.  Her blood pressure will tolerate more valsartan.     Due to her persistent cardiac limitation, history of PVCs, wide complex tachycardia, I would like to refer to medical genetics to exclude genetic causes.   Her blood pressure has incr    Plan:  1.  Referral to medical genetic  2.  Increase valsartan 40mg  BID  3.  Continue toprol 25mg  BID  4.  Echocardiogram during next visit    Follow-up:  Follow-up in 1 months.  Patient will call sooner if symptoms  change.    Total time of >25 minutes was spent face-to-face with the patient, of which more than 50% was spent counseling and coordinating care.    Juanetta Beets, MD  Division of Cardiology  Advanced Heart Failure/Cardiac Transplantation  06/24/2020

## 2020-06-24 ENCOUNTER — Other Ambulatory Visit (HOSPITAL_BASED_OUTPATIENT_CLINIC_OR_DEPARTMENT_OTHER): Payer: Self-pay | Admitting: Nurse Practitioner

## 2020-06-24 ENCOUNTER — Encounter (HOSPITAL_BASED_OUTPATIENT_CLINIC_OR_DEPARTMENT_OTHER): Payer: Self-pay | Admitting: Medical Oncology

## 2020-06-24 ENCOUNTER — Ambulatory Visit: Payer: Commercial Managed Care - PPO | Attending: Cardiovascular Disease | Admitting: Cardiovascular Disease

## 2020-06-24 DIAGNOSIS — I427 Cardiomyopathy due to drug and external agent: Secondary | ICD-10-CM

## 2020-06-24 DIAGNOSIS — T451X5A Adverse effect of antineoplastic and immunosuppressive drugs, initial encounter: Secondary | ICD-10-CM | POA: Insufficient documentation

## 2020-06-24 DIAGNOSIS — I493 Ventricular premature depolarization: Secondary | ICD-10-CM | POA: Insufficient documentation

## 2020-06-24 DIAGNOSIS — N62 Hypertrophy of breast: Secondary | ICD-10-CM | POA: Insufficient documentation

## 2020-07-01 ENCOUNTER — Encounter (HOSPITAL_BASED_OUTPATIENT_CLINIC_OR_DEPARTMENT_OTHER): Payer: Self-pay

## 2020-07-07 ENCOUNTER — Encounter (HOSPITAL_BASED_OUTPATIENT_CLINIC_OR_DEPARTMENT_OTHER): Payer: Self-pay | Admitting: Physician Assistant

## 2020-07-07 ENCOUNTER — Other Ambulatory Visit: Payer: Self-pay

## 2020-07-07 ENCOUNTER — Encounter (HOSPITAL_BASED_OUTPATIENT_CLINIC_OR_DEPARTMENT_OTHER): Payer: Self-pay

## 2020-07-07 DIAGNOSIS — C50412 Malignant neoplasm of upper-outer quadrant of left female breast: Secondary | ICD-10-CM

## 2020-07-07 DIAGNOSIS — Z170 Estrogen receptor positive status [ER+]: Secondary | ICD-10-CM

## 2020-07-09 ENCOUNTER — Ambulatory Visit (HOSPITAL_BASED_OUTPATIENT_CLINIC_OR_DEPARTMENT_OTHER): Payer: Commercial Managed Care - PPO

## 2020-07-09 ENCOUNTER — Other Ambulatory Visit (HOSPITAL_BASED_OUTPATIENT_CLINIC_OR_DEPARTMENT_OTHER): Payer: Commercial Managed Care - PPO

## 2020-07-09 ENCOUNTER — Ambulatory Visit: Payer: Commercial Managed Care - PPO | Attending: Nursing | Admitting: Physician Assistant

## 2020-07-09 VITALS — BP 99/68 | HR 67 | Temp 97.7°F | Resp 16 | Wt 149.8 lb

## 2020-07-09 DIAGNOSIS — R5383 Other fatigue: Secondary | ICD-10-CM | POA: Insufficient documentation

## 2020-07-09 DIAGNOSIS — R079 Chest pain, unspecified: Secondary | ICD-10-CM | POA: Insufficient documentation

## 2020-07-09 DIAGNOSIS — F329 Major depressive disorder, single episode, unspecified: Secondary | ICD-10-CM | POA: Insufficient documentation

## 2020-07-09 DIAGNOSIS — C50412 Malignant neoplasm of upper-outer quadrant of left female breast: Secondary | ICD-10-CM | POA: Insufficient documentation

## 2020-07-09 DIAGNOSIS — R0789 Other chest pain: Secondary | ICD-10-CM

## 2020-07-09 DIAGNOSIS — Z170 Estrogen receptor positive status [ER+]: Secondary | ICD-10-CM

## 2020-07-09 DIAGNOSIS — M8580 Other specified disorders of bone density and structure, unspecified site: Secondary | ICD-10-CM | POA: Insufficient documentation

## 2020-07-09 LAB — CBC, DIFF
% Basophils: 1 %
% Eosinophils: 1 %
% Immature Granulocytes: 0 %
% Lymphocytes: 26 %
% Monocytes: 9 %
% Neutrophils: 64 %
Absolute Eosinophil Count: 0.05 10*3/uL (ref 0.00–0.50)
Absolute Lymphocyte Count: 0.94 10*3/uL — ABNORMAL LOW (ref 1.00–4.80)
Basophils: 0.02 10*3/uL (ref 0.00–0.20)
Hematocrit: 34 % — ABNORMAL LOW (ref 36–45)
Hemoglobin: 11.6 g/dL (ref 11.5–15.5)
Immature Granulocytes: 0 10*3/uL (ref 0.00–0.05)
MCH: 30.3 pg (ref 27.3–33.6)
MCHC: 34.2 g/dL (ref 32.2–36.5)
MCV: 89 fL (ref 81–98)
Monocytes: 0.32 10*3/uL (ref 0.00–0.80)
Neutrophils: 2.36 10*3/uL (ref 1.80–7.00)
Platelet Count: 191 10*3/uL (ref 150–400)
RBC: 3.83 10*6/uL (ref 3.80–5.00)
RDW-CV: 12.2 % (ref 11.6–14.4)
WBC: 3.69 10*3/uL — ABNORMAL LOW (ref 4.3–10.0)

## 2020-07-09 LAB — COMPREHENSIVE METABOLIC PANEL
ALT (GPT): 15 U/L (ref 7–33)
AST (GOT): 18 U/L (ref 9–38)
Albumin: 4.3 g/dL (ref 3.5–5.2)
Alkaline Phosphatase (Total): 44 U/L (ref 34–121)
Anion Gap: 6 (ref 4–12)
Bilirubin (Total): 0.4 mg/dL (ref 0.2–1.3)
Calcium: 9.4 mg/dL (ref 8.9–10.2)
Carbon Dioxide, Total: 29 meq/L (ref 22–32)
Chloride: 104 meq/L (ref 98–108)
Creatinine: 0.78 mg/dL (ref 0.38–1.02)
Glucose: 110 mg/dL (ref 62–125)
Potassium: 3.7 meq/L (ref 3.6–5.2)
Protein (Total): 6.5 g/dL (ref 6.0–8.2)
Sodium: 139 meq/L (ref 135–145)
Urea Nitrogen: 15 mg/dL (ref 8–21)
eGFR by CKD-EPI: >60 mL/min/{1.73_m2} (ref >59)

## 2020-07-09 NOTE — Progress Notes (Signed)
Yolanda Bowers    ATTENDING ONCOLOGIST   Patient Care Team:  Kittie Plater, MD as Oncologist (Medical Oncology)    IDENTIFICATION  Yolanda Bowers is a 49 year old female with history of cT3N3 left breast invasive ductal carcinoma, ER/PR/HER2+, s/p neoadjuvant chemotherapy with ddAC-THP, left mastectomy and SLN biopsy, and adjuvant radiation therapy. She had a complete pathologic response at the time of surgery. Presents to clinic today for labs and follow up while on letrozole.     ONCOLOGIC HISTORY     Oncology History Overview Bowers   10/2018: [name]   11/24/2018: Bilateral mammogram revealed a negative right breast. To the left breast UOQ, there was an 59m mass and a 2.4 cm mass laterally, 17 cm from the nipple. Targeted left breast ultrasound revealed at 3 oclock 8 cm from the nipple, a 21x13x274mmass and an enlarged axillary lymph node measuring 12x11x1578mThere were multiple enlarged lymph nodes  11/27/2018: US-guided biopsy of the breast mass revealed invasive ductal carcinoma, ER/PR+, HER2+, grade 3. There was angiolymphatic invasion seen on biopsy. Biopsy of axillary lymph node positive for carcinoma.   12/08/2018: Bilateral breast MRI revealed right breast at 3 oclock, 2.7cm from the nipple, a 10 x 6 x8mm15mss and at 12 oclock, 11cm from the nipple, a spiculated 16 x 4 x 13mm1ms. Ultrasound recommended.  To the left breast, mass consistent with biopsy proved carcinoma measuring 24 x 20 x 24mm.17mre was also diffuse NME involving mid-outer breast extending over 110x45x69mm. 73min the NME, there was a small spiculated mass at 12 oclock measuring 12mm. M74mple level 1,2 and 3 lymph nodes seen, in addition to at least 3 internal mammary nodes.   12/14/2018: PET CT without evidence of distant metastasis. There are 4mm lung27mdules bilaterally.   12/29/2018: Started on neoadjuvant chemotherapy with  ddAC x4 cycles.  03/09/2019: Started on neoadjuvant chemotherapy with Taxol, Herceptin, and Perjeta.  06/25/2019: Underwent left mastectomy and SLN biopsy with Dr. David ByrImagene Shellergy revealed a single focus of angiolymphatic involvement by carcinoma (< 0.1cm). Negative surgical margins. 0 of 5 lymph nodes positive for carcinoma. ypT0 ypN0.   09/21/2019: Completed XRT.  Started on letrozole 2.5mg PO da24m.     01/29/2020: Given drop in LVEF, last Herceptin/Perjeta infusion was discontinued. Letrozole continued.    Negative genetic panel testing, 11/2018.     Malignant neoplasm of upper-outer quadrant of left breast in female, estrogen receptor positive (HCC)   3/1[place]021 - 01/29/2020 Systemic Therapy    pertuzumab (Perjeta) 840 mg in sodium chloride 0.9 % 250 mL IVPB, Intravenous, 4 of 6 cycles  trastuzumab (Herceptin) 593 mg in sodium chloride 0.9 % 250 mL IVPB, Intravenous, 4 of 6 cycles     11/18/2019 Initial Diagnosis    Malignant neoplasm of upper-outer quadrant of left breast in female, estrogen receptor positive (HCC)      [place] INTERVAL HISTORY  Yolanda Bowers by Dr. Linden on Charna Archer021. 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 She reports new chest pain for the last 4-5 days. She describes the pain as a "constant pressure" and rates it a 3-4 on the pain scale. She states this pain is similar to the chest pain she hard prior to her previous cardiac concerns/abaltion. She did have 1  episode of dizziness today after getting her blood drawn, but denies any other dizziness or syncope. Denies any radiation of the pain. She has not had any recent change in her cardiac medications, and is scheduled to see her cardiologist on December 7th. She also complains of left arm tingling and swelling. This began about 2 weeks ago, and is concerning to her as it is similar to the arm tingling she had prior to her initial breast cancer diagnosis. She  states the tingling is constant, radiates down her arm to her hand, and is associated with left axillary swelling and intermittent warmth. Using her lymphedema arm sleeve does not help this tingling/swelling, and she recently completed physical therapy.  As far as her letrozole therapy, she believed she is tolerating it well. She takes it daily, and has noticed improvement in her previous join pain. She takes Effexor for her associated hot flashes, which has also been helping her mood. Due to her work stress her mood has been down, and she sees a Social worker at [employer] for this. She complains of generalized fatigue and some cognitive complaints such as having to write things down to remember etc.       ECOG Status: (0) Fully active, able to carry on all predisease performance without restriction     REVIEW OF SYSTEMS  A complete review of systems was conducted and is negative in detail, except as noted in the interval history.    PAST MEDICAL HISTORY  Patient Active Problem List   Diagnosis    Macromastia    Malignant neoplasm of upper-outer quadrant of left breast in female, estrogen receptor positive ([place])    CIN I (cervical intraepithelial neoplasia I)    Chemotherapy induced cardiomyopathy ([place])    PVC's (premature ventricular contractions)        PAST SURGICAL HISTORY  Past Surgical History:   Procedure Laterality Date    BREAST BIOPSY Right 11/2018    2 core bx      BREAST RECONSTRUCTION Right 06/25/2019    reduction - Dr. Lowella Dell    PR MASTECTOMY SIMPLE COMPLETE Left 06/25/2019    Had radiation        ALLERGIES  Review of patient's allergies indicates:  Allergies   Allergen Reactions    Oxycodone Skin: Itching    Codeine ZD:[id number]/[id number]    Lisinopril Resp: Cough    Walnut Skin: Rash    Paclitaxel Skin: Hives        MEDICATIONS    Current Outpatient Medications:     acetaminophen 500 MG tablet, Take 1-2 tabs by mouth once daily as needed for pain., Disp: , Rfl:     Calcium Citrate-Vitamin D  (CITRACAL + D OR), Take 1 tablet by mouth daily., Disp: , Rfl:     cetirizine 10 MG tablet, Take 10 mg by mouth daily., Disp: , Rfl:     famotidine 20 MG tablet, Take 1 tablet (20 mg) by mouth daily as needed for indigestion/heartburn., Disp: , Rfl:     letrozole 2.5 MG tablet, Take 1 tablet (2.5 mg) by mouth daily., Disp: 90 tablet, Rfl: 3    metoprolol succinate ER 25 MG 24 hr tablet, Take 0.5 tablets (12.5 mg) by mouth daily. Do not chew or crush. (Patient taking differently: Take 12.5 mg by mouth 2 times a day. Do not chew or crush.), Disp: 15 tablet, Rfl: 3    valsartan 40 MG tablet, Take 0.5 tablets (20 mg) by mouth 2 times a day., Disp: 90  tablet, Rfl: 3    venlafaxine ER 75 MG 24 hr capsule, Take 1 capsule (75 mg) by mouth every morning., Disp: 90 capsule, Rfl: 0     SOCIAL HISTORY:   Social History     Tobacco Use    Smoking status: Never Smoker    Smokeless tobacco: Never Used   Substance Use Topics    Alcohol use: Not Currently    Drug use: Not Currently        FAMILY HISTORY:    Cancer-related family history includes Ovarian Cancer (age of onset: 72) in her paternal aunt; Skin Cancer in her maternal grandfather and mother.     PHYSICAL EXAMINATION   VITAL SIGNS: BP 99/68    Pulse 67    Temp 36.5 C (Oral)    Resp 16    Wt 67.9 kg (149 lb 12.8 oz)    SpO2 97%    BMI 24.18 kg/m       GENERAL: Well appearing, well developed female in no acute distress.  HEENT: Normocephalic, atraumatic. Sclerae anicteric. Pupils are equal, round.   NECK: Neck is supple without any appreciable lymphadenopathy or thyromegaly.  LUNGS: Respirations regular and unlabored. Clear to auscultation bilaterally.  CARDIAC: Regular rate and rhythm. No murmurs, gallops, or rubs.   BREASTS: Left breast s/p mastectomy with well healed surgical scars. Mild edema noted on the left outer chest wall near axilla. Right breast without skin changes or retraction. No palpable masses, tenderness, or lesions. Nipple everted without  discharge. No appreciable axillary lymphadenopathy bilaterally.  ABDOMEN: Soft, non-tender, non-distended. Normoactive bowel sounds. No hepatosplenomegaly or masses appreciated.  EXTREMITIES: No edema, clubbing, or cyanosis.  SKIN: No visible rash, bruising, or petechiae.  MSK: Without muscle or joint deformities.   NEUROLOGICAL: Alert and oriented. Mental status appropriate.   NUTRITIONAL STATUS: Adequate.    LABORATORY  Results for orders placed or performed in visit on 07/09/20   CBC with Differential   Result Value Ref Range    WBC 3.69 (L) 4.3 - 10.0 10*3/uL    RBC 3.83 3.80 - 5.00 10*6/uL    Hemoglobin 11.6 11.5 - 15.5 g/dL    Hematocrit 34 (L) 36 - 45 %    MCV 89 81 - 98 fL    MCH 30.3 27.3 - 33.6 pg    MCHC 34.2 32.2 - 36.5 g/dL    Platelet Count 191 150 - 400 10*3/uL    RDW-CV 12.2 11.6 - 14.4 %    % Neutrophils 64 %    % Lymphocytes 26 %    % Monocytes 9 %    % Eosinophils 1 %    % Basophils 1 %    % Immature Granulocytes 0 %    Neutrophils 2.36 1.80 - 7.00 10*3/uL    Absolute Lymphocyte Count 0.94 (L) 1.00 - 4.80 10*3/uL    Monocytes 0.32 0.00 - 0.80 10*3/uL    Absolute Eosinophil Count 0.05 0.00 - 0.50 10*3/uL    Basophils 0.02 0.00 - 0.20 10*3/uL    Immature Granulocytes 0.00 0.00 - 0.05 10*3/uL    RBC Morphology See RBC data     Platelet Morphology See PLT count     WBC Morphology See Diff    Comprehensive Metabolic Panel   Result Value Ref Range    Sodium 139 135 - 145 meq/L    Potassium 3.7 3.6 - 5.2 meq/L    Chloride 104 98 - 108 meq/L    Carbon Dioxide, Total 29 22 -  32 meq/L    Anion Gap 6 4 - 12    Glucose 110 62 - 125 mg/dL    Urea Nitrogen 15 8 - 21 mg/dL    Creatinine 0.78 0.38 - 1.02 mg/dL    Protein (Total) 6.5 6.0 - 8.2 g/dL    Albumin 4.3 3.5 - 5.2 g/dL    Bilirubin (Total) 0.4 0.2 - 1.3 mg/dL    Calcium 9.4 8.9 - 10.2 mg/dL    AST (GOT) 18 9 - 38 U/L    Alkaline Phosphatase (Total) 44 34 - 121 U/L    ALT (GPT) 15 7 - 33 U/L    eGFR, Calculated >60 >59 mL/min/[1.73_m2]    GFR, Information        Calculated GFR by CKD-EPI equation. Inaccurate with changing renal function. See http://depts.YourCloudFront.fr.html.        IMAGING  Imaging Results:  Dexa, Axial Skeleton  Narrative: EXAMINATION:  DEXA, AXIAL SKELETON: 11/06/2019     INDICATION:  49 year old postmenopausal woman undergoing treatment for breast cancer. No   history of fragility fracture. Is currently taking hormonal therapy, but no   antiresorptive medication..     TECHNIQUE:  Bone mineral densitometry of the lumbar spine (L1-L4) and bilateral hips was   performed with [employer].     Imaging Site: [facility]     COMPARISON:  None available.     FINDINGS:  BONE MINERAL DENSITY DETERMINATION:     Technical Limitations: None.     Lumbar spine (L1-L4)  Average bone mineral density: 1.122 g/cm2  T-score: -0.6 (94% young normal values)  Z-score: -0.4 (96% age-matched normal values)     Left total hip  Average bone mineral density: 0.812 g/cm2  T-score: -1.6 (81% young normal values)  Z-score: -1.3 (84% age-matched normal values)     Left femoral neck  Average bone mineral density: 0.795 g/cm2   T-score: -1.8 (77% young normal values)   Z-score: -1.1 (84% age-matched normal values)      Right total hip  Average bone mineral density: 0.822 g/cm2  T-score: -1.5 (82% young normal values)  Z-score: -1.2 (85% of age-matched normal values)     Right femoral neck  Average bone mineral density: 0.812 g/cm2   T-score: -1.6 (78% young normal values)   Z-score: -1.0 (85% age-matched normal values)      FRAX was not reported due to ongoing hormone therapy.     ATTENDING RADIOLOGIST AND PAGER NUMBER  [id number] LE[address] DAVID Nadara Mustard MD  [phone]  Impression: IMPRESSION:  Osteopenia with elevated risk of osteoporosis-related fracture.     (Based upon the [facility] international reference standard for osteoporosis and the   2019 official position of the International Society for Clinical Densitometry   (ISCD): Osteoporosis is defined as a  T-score of -2.5 or lower at the lumbar   spine, total hip, femoral neck, or 33% radius in postmenopausal females and   males age 94 and older. A T-score of -1.0 or higher is defined as normal bone   mineral density. A T-score between -1.0 and -2.5 is considered to be low bone   mineral density or osteopenia. Severe osteoporosis connotes a T-score of -2.5   or below with fragility fracture.)   CT Chest wo Contrast  Narrative: EXAMINATION:   CT CHEST WO CONTRAST     CLINICAL INDICATION:  baseline, on AI     TECHNIQUE:   CTB C1 CT Chest without contrast.  Contiguous axial sections were obtained through the chest  without contrast.   Multiplanar reformatted images were performed.     COMPARISON:  Chest CTs from 07/31/2019 (radiotherapy planning) and 02/21/2019.     FINDINGS:  Supraclavicular region:  Normal.     Mediastinum:  No thoracic lymphadenopathy.     Lungs:  Stable 3 mm solid nodule in the right middle lobe on image 302/63   since February 21, 2019. New groundglass nodule in the left upper lobe measuring   1.9 x 0.8 cm on image 302/35.     Pleura:  No pleural effusion.     Heart:  Normal heart size with no pericardial effusion.     Bones and chest wall  No lytic or blastic bone lesions are detected. Prior   left mastectomy noted.     Upper abdomen:  Unremarkable.     ATTENDING RADIOLOGIST AND PAGER NUMBER  [phone] Rosemary Holms D MD  [phone]  Impression: IMPRESSION:  1. Compared to 07/31/2019, new round glass nodule in the left upper lobe (1.9   cm), likely postradiation pneumonitis.     2. Stable 3 mm nodule in the right middle lobe since February 21, 2019 but remains   indeterminate. Continued chest CT surveillance in 12 months would be helpful   to establish 2-year stability.       ASSESSMENT/PLAN  Pollie Ima Hafner is a 49 year old female with history of cT3N3 left breast invasive ductal carcinoma, ER/PR/HER2+, s/p neoadjuvant chemotherapy with ddAC-THP, left mastectomy and SLN biopsy, and adjuvant radiation  therapy. She had a complete pathologic response at the time of surgery. Presents to clinic today for labs and follow up while on letrozole     Chest pain:  Chest pain concerning given patients history of cardiac problems and generalized fatigue. EKG performed in clinic within normal limits, normal sinus rhythm. Encouraged patient to reach out to her cardiologist ASAP, and that if things progressed she would need to go to an emergency room.     Breast Cancer:   Destiny states she is tolerating her letrozole well, but does have some concerns for fatigue and cognition. Discussed with patient that this could be related to a multitude of things (cardiac hx, depression etc.) and that if these complaints worsen we could consider a break from letrozole to determine if it is contributing. Patients labs continue to improve for previous anemia. For her left arm tingling and axillary swelling will order an ultrasound for further workup. If ultrasound is negative can consider re-referring to physical therapy and trying gabapentin for nerve pain. We also discussed that she can now have her port removed as she has completed IV therapy.   -Surveillance Imaging/Mammogram/MRI: right mammogram due 03/2021   - Prothrombin Time; Future  - Partial Thromboplastin Time; Future  - Platelet Count; Future  - Discontinue Implanted Port; Future  - Ultrasound Breast Limited Left; Future  - Comprehensive Metabolic Panel; Future  - CBC, Abs Neutrophil; Future  - Clinic Visit MD or APP; Future    Bone Health:  -Dexa: scan on 3/21 showing osteopenia, due again 10/2021     Psych:    -Patient working with counselor at [facility], continued to encourage reaching out if things worsen. Patient verbalized understanding and denied any thoughts of self arm.     Survivorship:  - Counseled on regular exercise and dietary measures to reduce risk of recurrence.    DISPOSITION  Patient to return to clinic in 3 months for follow up and surveillance. Patient  encouraged to contact  clinic in the interim with any questions or concerns.    This patient was seen and discussed with Briana Richine  PA-C

## 2020-07-09 NOTE — Progress Notes (Signed)
Oncology Clinic LPN or MA Note      Reason for Visit: Follow-Up  and EKG        Vitals:    07/09/20 1607   Temp: 36.5 C   Pulse: 67   BP: 99/68   Resp: 16   SpO2: 97%   Weight: 67.9 kg (149 lb 12.8 oz)           Summary: Performed EKG, notified to provider and transmitted to cardiology.  [name], [facility], 07/09/2020 5:20 PM      Additional documentation may exist in Flowsheets or Education Activity

## 2020-07-10 ENCOUNTER — Other Ambulatory Visit (HOSPITAL_BASED_OUTPATIENT_CLINIC_OR_DEPARTMENT_OTHER): Payer: Self-pay | Admitting: Cardiovascular Disease

## 2020-07-10 ENCOUNTER — Encounter (HOSPITAL_BASED_OUTPATIENT_CLINIC_OR_DEPARTMENT_OTHER): Payer: Self-pay | Admitting: Cardiovascular Disease

## 2020-07-10 DIAGNOSIS — I429 Cardiomyopathy, unspecified: Secondary | ICD-10-CM

## 2020-07-11 LAB — EKG 12 LEAD
Atrial Rate: 61 {beats}/min
Diagnosis: NORMAL
P Axis: 79 degrees
P-R Interval: 134 ms
Q-T Interval: 412 ms
QRS Duration: 84 ms
QTC Calculation: 414 ms
R Axis: 85 degrees
T Axis: 81 degrees
Ventricular Rate: 61 {beats}/min

## 2020-07-14 MED ORDER — VALSARTAN 40 MG OR TABS
40.0000 mg | ORAL_TABLET | Freq: Two times a day (BID) | ORAL | 3 refills | Status: DC
Start: 2020-07-14 — End: 2021-06-23

## 2020-07-20 ENCOUNTER — Other Ambulatory Visit (HOSPITAL_BASED_OUTPATIENT_CLINIC_OR_DEPARTMENT_OTHER): Payer: Self-pay | Admitting: Cardiovascular Disease

## 2020-07-20 DIAGNOSIS — I429 Cardiomyopathy, unspecified: Secondary | ICD-10-CM

## 2020-07-21 ENCOUNTER — Telehealth (HOSPITAL_BASED_OUTPATIENT_CLINIC_OR_DEPARTMENT_OTHER): Payer: Self-pay

## 2020-07-21 DIAGNOSIS — Z170 Estrogen receptor positive status [ER+]: Secondary | ICD-10-CM

## 2020-07-21 DIAGNOSIS — C50412 Malignant neoplasm of upper-outer quadrant of left female breast: Secondary | ICD-10-CM

## 2020-07-21 MED ORDER — METOPROLOL SUCCINATE ER 25 MG OR TB24
12.5000 mg | EXTENDED_RELEASE_TABLET | Freq: Every day | ORAL | 3 refills | Status: DC
Start: 2020-07-21 — End: 2020-08-17

## 2020-07-21 NOTE — Telephone Encounter (Signed)
Oncology Telephone Triage Note      Subjective / Visit Information:    Reason for Call: Musculoskeletal Problem (Numbness, Tingling, Tenderness of L Axilla & Arm radiating down to Fingers)    Assessment and Plan:     Call Summary: Pt calls, L axilla/arm sore, uncomfortable, tingling, numbness, tenderness all have worsened since visit with [facility] 07/09/20. Pt anxious about this as it is the same feeling she had at her original breast cancer dx. Pt states her axilla is really "sore" she has been trying to massage to reduce this but interventions are without improvement, she has tingling that radiates from her axilla down her arm to her fingertips, numbness occurs when she first wakes up in the AM feeling like she has a dead arm. She feels pain deep inside her chest/ axilla area. Pt has 2 messages into her cardiologist to follow up on some PVCs/chest pain she has been having, plans to call them today. She wonders if the PVCs have increased due to her anxiety about these arm s/s. Per Michel Santee, PA note 11/17:    She reports new chest pain for the last 4-5 days. She describes the pain as a "constant pressure" and rates it a 3-4 on the pain scale. She states this pain is similar to the chest pain she hard prior to her previous cardiac concerns/abaltion. She did have 1 episode of dizziness today after getting her blood drawn, but denies any other dizziness or syncope. Denies any radiation of the pain. She has not had any recent change in her cardiac medications, and is scheduled to see her cardiologist on December 7th. She also complains of left arm tingling and swelling. This began about 2 weeks ago, and is concerning to her as it is similar to the arm tingling she had prior to her initial breast cancer diagnosis. She states the tingling is constant, radiates down her arm to her hand, and is associated with left axillary swelling and intermittent warmth. Using her lymphedema arm sleeve does not help this  tingling/swelling, and she recently completed physical therapy.    For her left arm tingling and axillary swelling will order an ultrasound for further workup. If ultrasound is negative can consider re-referring to physical therapy and trying gabapentin for nerve pain.     RN will forward message onto provider, get any further recommendations, will ask TC to schedule [place] ASAP.     Action Taken: Provider Notified    Response: Aware of clinic contact information, States understanding of plan, Restates plan and No further questions      Additional documentation may exist in Flowsheets or Education Activity

## 2020-07-22 ENCOUNTER — Ambulatory Visit
Admission: RE | Admit: 2020-07-22 | Discharge: 2020-07-22 | Disposition: A | Discharge status: Home / Self Care | Payer: Commercial Managed Care - PPO | Attending: Diagnostic Radiology | Admitting: Diagnostic Radiology

## 2020-07-22 DIAGNOSIS — C50412 Malignant neoplasm of upper-outer quadrant of left female breast: Secondary | ICD-10-CM | POA: Insufficient documentation

## 2020-07-22 DIAGNOSIS — Z170 Estrogen receptor positive status [ER+]: Secondary | ICD-10-CM | POA: Insufficient documentation

## 2020-07-23 ENCOUNTER — Telehealth (HOSPITAL_BASED_OUTPATIENT_CLINIC_OR_DEPARTMENT_OTHER): Payer: Self-pay

## 2020-07-23 DIAGNOSIS — Z7189 Other specified counseling: Secondary | ICD-10-CM

## 2020-07-23 NOTE — Addendum Note (Signed)
Addended by: Teena Dunk on: 07/23/2020 11:20 AM     Modules accepted: Orders

## 2020-07-23 NOTE — Progress Notes (Signed)
Spiritual Health Visit     Encounter Details   Patient Name (if different from chart):     Patient Name: Yolanda Bowers   Referral From: (P) Screening Questionnaire   Requested by: (P) Protocol   Form of Contact: (P) Voicemail   Present at Encounter: (P) Patient   Visit Type:   (P) Follow-up   Location: (P) [facility]      (P) [facility]   Spiritual Preference:     Psycho/Social History:      Electronic Screening   Electronic Screening   Do You Have Religious or Spiritual Stuggles?: Not at all   [facility]what   Spiritual/Religious/Existential Distress: There is an indication of spiritual distress   Date of Screening: 07/07/20   Outcome of Outreach Call: Left voicemail   Date of Outreach Call: 07/23/20    Narrative of Spiritual Encounter  I left a voicemail offering support       Plan of Care   Continue Visiting: (P) No further needs at this time   Referral to:      Visit Duration   Direct Time: 5   Indirect Time: 15

## 2020-07-29 ENCOUNTER — Other Ambulatory Visit: Payer: Self-pay

## 2020-07-29 ENCOUNTER — Ambulatory Visit (HOSPITAL_BASED_OUTPATIENT_CLINIC_OR_DEPARTMENT_OTHER): Payer: Commercial Managed Care - PPO | Admitting: Cardiovascular Disease

## 2020-07-29 ENCOUNTER — Ambulatory Visit (HOSPITAL_BASED_OUTPATIENT_CLINIC_OR_DEPARTMENT_OTHER)
Admit: 2020-07-29 | Discharge: 2020-07-29 | Disposition: A | Discharge status: Home / Self Care | Payer: Commercial Managed Care - PPO | Source: Home / Self Care

## 2020-07-29 ENCOUNTER — Ambulatory Visit
Admission: RE | Admit: 2020-07-29 | Discharge: 2020-07-29 | Disposition: A | Discharge status: Home / Self Care | Payer: Commercial Managed Care - PPO | Attending: Cardiovascular Disease | Admitting: Cardiovascular Disease

## 2020-07-29 VITALS — BP 108/65 | HR 60 | Temp 96.1°F | Ht 66.0 in | Wt 149.0 lb

## 2020-07-29 DIAGNOSIS — Z9221 Personal history of antineoplastic chemotherapy: Secondary | ICD-10-CM

## 2020-07-29 DIAGNOSIS — I427 Cardiomyopathy due to drug and external agent: Secondary | ICD-10-CM | POA: Insufficient documentation

## 2020-07-29 DIAGNOSIS — R002 Palpitations: Secondary | ICD-10-CM | POA: Insufficient documentation

## 2020-07-29 DIAGNOSIS — R202 Paresthesia of skin: Secondary | ICD-10-CM

## 2020-07-29 DIAGNOSIS — I429 Cardiomyopathy, unspecified: Secondary | ICD-10-CM | POA: Insufficient documentation

## 2020-07-29 DIAGNOSIS — Z853 Personal history of malignant neoplasm of breast: Secondary | ICD-10-CM

## 2020-07-29 DIAGNOSIS — T451X5A Adverse effect of antineoplastic and immunosuppressive drugs, initial encounter: Secondary | ICD-10-CM

## 2020-07-29 LAB — TRANSTHORACIC ECHO (TTE) LIMITED
AoV max: 126.8 cm/s
Ascending aorta: 3.1 cm
E/E' ratio: 5.8
IVSd: 0.72 cm
LV Systolic Volume (BP): 30.7 ml
LVIDd: 4.5 cm
LVIDs: 2.4 cm
LVPWd: 0.88 cm
RVDd: 3.3 cm
RVDd: 3.3 cm

## 2020-07-29 NOTE — Patient Instructions (Addendum)
Your echocardiogram shows your heart function has normalized and looks better    We will continue with the current medications    Please see the geneticist in 08/2020 as scheduled    I will reach out to Dr. Charna Archer regarding the hot flashes and pin prick sensation    Please contact your PCP regarding these issues as well      Cardiac rehab-- I will look into this.    Follow-up 3 months

## 2020-07-29 NOTE — Progress Notes (Addendum)
CARDIOLOGY CLINIC NOTE    Name:  Yolanda Bowers  Date of Birth: 28-Jun-1971  Medical Record Number: [id number]  Primary Care Physician:  Margreta Journey, MD  Referring Provider:  Teena Dunk, *  Date of Service: 07/29/2020    ID  Yolanda Bowers is a 49 year old female with history of high-grade infiltrating ductal carcinoma of the left breast (ER+/PR+, FISH +) with angiolymphatic invasion, history of neoadjuvant chemotherapy with ddAC-THP, left mastectomy with lymph node biopsy, received 4 or 6 cycles of herceptin and Perjeta due to drop in EF, history of PVC ablation on 03/21/2020, here for follow-up.    HISTORY:  Couple of weeks ago, had shooting pains down the arm with associated numbness and swelling.  She had an ultrasound of the breast, which was unremarkable.  She remains fatigued with extreme exertion. Going back to work has been challenging for her due to stressors.  Her appetite has been intact.  She is having mild headaches.  No heart failure symptoms.  She is having more spells of palpitations not lasting for a long period of time.    blood pressure at home 98-110/60    Patient Active Problem List    Diagnosis Date Noted    PVC's (premature ventricular contractions) [I49.3] 02/18/2020     Ablation on 03/21/2020:   Successful ablation for PVCs from the interleaflet trigone in the LCC/RCC commisure below the aortic valve      Chemotherapy induced cardiomyopathy ([place]) [I42.7, T45.1X5A] 01/19/2020     Echo: 03/28/2019:  left ventricular end-diastolic dimension of 9.3OI, EF of 60%, strain -20%, trabeculated LV, normal valves, mild LAE, normal diastology    echocardiogram from 08/14/2019:  left ventricular end-diastolic dimension of 7.1IW, EF of 59%, GLS -20%, no change from 03/28/2019    Echocardiogram from 01/02/2020:   left ventricular end-diastolic dimension of 5.8KD, EF of 51%, GLS -13%, normal RV function, normal valves, decreased function compared to 08/14/2019    Coronary angiogram from  03/20/2020:  normal    cMRI from 03/18/2020:  1. The left ventricle (LV) is mildly dilated (LVEDVi= 94 ml/m2) with low normal systolic function (LVEF= 98%). No regional wall motion abnormalities. There is no LV hypertrophy (LVmassI= 45 gm/m2).    2. The right ventricle (RV) is normal in size (RVEDVi= 83 ml/m2) with normal systolic function (RVEF= 33%).     3. On delayed enhancement imaging there is intramyocardial late gadolinium enhancement at the inferior RV attachment site.    4. On limited T2-weighted imaging there is no evidence of myocardial edema.    5. On T1-mapping, the native T1 value is  1070ms (normal).    6. No significant valvular abnormalities.    7. Left atrial area measures 25cm2 and LAVI= 62mL/m2 (dilated) via biplanar method; Right atrial area measures 14cm2 via 4-chamber monoplanar method.          Malignant neoplasm of upper-outer quadrant of left breast in female, estrogen receptor positive ([place]) [C50.412, Z17.0] 11/18/2019    Macromastia [N62] 08/31/2019    CIN I (cervical intraepithelial neoplasia I) [N87.0] 06/01/2018     Review of patient's allergies indicates:  Allergies   Allergen Reactions    Oxycodone Skin: Itching    Codeine AS:NKNLZJ/QBHALPFX    Lisinopril Resp: Cough    Walnut Skin: Rash    Paclitaxel Skin: Hives     Current Outpatient Medications   Medication Sig Dispense Refill    acetaminophen 500 MG tablet Take 1-2 tabs by mouth  once daily as needed for pain.      Calcium Citrate-Vitamin D (CITRACAL + D OR) Take 1 tablet by mouth daily.      cetirizine 10 MG tablet Take 10 mg by mouth daily as needed.       famotidine 20 MG tablet Take 1 tablet (20 mg) by mouth daily as needed for indigestion/heartburn.      letrozole 2.5 MG tablet Take 1 tablet (2.5 mg) by mouth daily. 90 tablet 3    metoprolol succinate ER 25 MG 24 hr tablet Take 0.5 tablets (12.5 mg) by mouth daily. Do not chew or crush. 45 tablet 3    valsartan 40 MG tablet Take 1 tablet (40 mg) by mouth 2  times a day. 180 tablet 3    venlafaxine ER 75 MG 24 hr capsule Take 1 capsule (75 mg) by mouth every morning. 90 capsule 0     No current facility-administered medications for this visit.       Review of Systems:  A complete review of systems was otherwise negative except as noted above.   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      Physical Exam:  Wt Readings from Last 3 Encounters:   07/29/20 67.6 kg (149 lb)   07/09/20 67.9 kg (149 lb 12.8 oz)   05/07/20 66.3 kg (146 lb 2.6 oz)     Telmed exam:  JVP at the clavicle    Laboratory Results:     Labs from 05/07/2020:  Na 138 K 4.2 Cl 104 CO2 30 BUN 15 Cr 0.76  WBC 2.58 Hct 34 Plt 178    Results for orders placed or performed during the hospital encounter of 07/29/20   TransTHORACIC echo (TTE) limited   Result Value Ref Range    RVDd 3.3 cm    IVSd 0.72 cm    LVIDd 4.5 cm    LVIDs 2.4 cm    LVPWd 0.88 cm    Ascending aorta 3.1 cm    LV Systolic Volume (BP) 42.7 ml    E/E' ratio 5.8     RVDd 3.3 cm    AoV max 126.8 cm/sec     Results for orders placed or performed in visit on 01/30/20   Lipid Panel   Result Value Ref Range    Cholesterol (Total) 165 <200 mg/dL    Triglyceride 106 <150 mg/dL    Cholesterol (HDL) 63 >39 mg/dL    Cholesterol (LDL) 81 <130 mg/dL    Non-HDL Cholesterol 102 0 - 159 mg/dL    Cholesterol/HDL Ratio 2.6     Lipid Panel, Additional Info. (NOTE)        Imaging:    EKG:01/11/2020:  Normal sinus rhythm, heart rate of 74 bpm, normal PR interval, premature ventricular contractions, LAE    cardiopulmonary exercise test on 05/30/2020:    Patient felt that the breathing was hard for her  Max VO2 15 ml/kg/min (66%) , RER 1.21, normal blood pressure response to exercise,  normal heart rate response, O2 pulse of 82%, 30% MVV    Echocardiogram from today (07/29/2020):  Normal LV size and functoin EF of 61% with GLS of -19%, normal RV size and function, minimal MR.Marland Kitchen  EF  has improved from 12/2019.    GDMT:  ACEI/ARB/ARNI: valsartan 40mg  BID  Beta-blockers: toprol XL 12.5mg  daily  MRAs:  SGLT2 inhibitor:  Diuretics:  Statin:  Digoxin:     Assessment:     49 year old female with history of chemotherapy-induced cardiomyopathy with history of PVC ablation, wide complex tachycardia, history of high-grade infiltrating ductal carcinoma of the left breast (ER+/PR+, FISH +) with angiolymphatic invasion, requiring neoadjuvant chemotherapy with ddAC-THP, left mastectomy with lymph node biopsy, Herceptin and Perjeta, which had to be stopped after 4 cycles.  Echocardiogram from today shows normalization of cardiac function wih GLS supporting the improved EF.  She remains well compensated, but is having more palpitations.    I am encouraged with her cardiac improvement and her response to vasodilator therapy.  Since she had wide complex tachycardia on prior holters, I would like to confirm that she is not having more runs of arrhythmias with a repeat holter today.  She is having tingling sensation without weakness of the left upper extremity.  Unclear if this is residual from her mastectomy and breast cancer treatment.      She will benefit with cardiac rehab.  I will send a referral to see if she qualifies for cardiomyopathy.  She may be able to enroll in exercise study for breast cancer survivors (ACTIVATE).     Plan:  1.  Repeat 7 day holter monitor for arrhythmia burden  2.  Refer to cardiac rehab for ongoing functional and endurance limitations documented on cardiopulmonary exercise test  3.  Discuss with Dr. Charna Archer regarding her left upper extremity symptoms   4.  Awaiting input from genetics regarding whether she will benefit for genetic testing due to her arrhythmia burden and history of  cardiomyopathy.   5.  Continue with current GDMT    Follow-up:  Follow-up in 3 months.  Patient will call sooner if symptoms change.    Total time of >25 minutes was spent face-to-face with the patient, of which more than 50% was spent counseling and coordinating care.    Juanetta Beets, MD  Division of Cardiology  Advanced Heart Failure/Cardiac Transplantation  07/29/2020

## 2020-07-29 NOTE — Procedure Nursing Note (Signed)
7 day CAM placed in office today. All questions/concerns answered. USPS tracking # [id number]

## 2020-08-07 ENCOUNTER — Ambulatory Visit
Admission: RE | Admit: 2020-08-07 | Discharge: 2020-08-07 | Disposition: A | Discharge status: Home / Self Care | Payer: Commercial Managed Care - PPO | Attending: Diagnostic Radiology | Admitting: Diagnostic Radiology

## 2020-08-07 DIAGNOSIS — Z170 Estrogen receptor positive status [ER+]: Secondary | ICD-10-CM | POA: Insufficient documentation

## 2020-08-07 DIAGNOSIS — C50412 Malignant neoplasm of upper-outer quadrant of left female breast: Secondary | ICD-10-CM | POA: Insufficient documentation

## 2020-08-09 ENCOUNTER — Encounter (HOSPITAL_BASED_OUTPATIENT_CLINIC_OR_DEPARTMENT_OTHER): Payer: Self-pay | Admitting: Cardiovascular Disease

## 2020-08-17 ENCOUNTER — Other Ambulatory Visit (HOSPITAL_BASED_OUTPATIENT_CLINIC_OR_DEPARTMENT_OTHER): Payer: Self-pay | Admitting: Cardiovascular Disease

## 2020-08-17 DIAGNOSIS — I429 Cardiomyopathy, unspecified: Secondary | ICD-10-CM

## 2020-08-17 MED ORDER — METOPROLOL SUCCINATE ER 25 MG OR TB24
25.0000 mg | EXTENDED_RELEASE_TABLET | Freq: Every day | ORAL | 3 refills | Status: DC
Start: 2020-08-17 — End: 2021-08-06

## 2020-08-24 ENCOUNTER — Encounter (HOSPITAL_BASED_OUTPATIENT_CLINIC_OR_DEPARTMENT_OTHER): Payer: Self-pay | Admitting: Physician Assistant

## 2020-08-25 NOTE — Progress Notes (Addendum)
[facility] CARDIOVACULAR GENETICS CLINIC INITIAL VISIT - TELEMEDICINE     ID:  Yolanda Bowers is a  50 year old  womn who was seen for evaluation of dilated cardiomyopathy (DCM) and PVCs following treatment for breast cancer.      Distant Site Telemedicine Encounter    I conducted this encounter from [facility] office via secure, live, face-to-face video conference with the patient. Yolanda Bowers was located at home alone and she agreed to visit by telemedicine today. Prior to the interview, I introduced myself with my hospital ID badge, verified the patient's identity with DOB.     Referral: Dr. [name]    Addendum 09/24/20  I released Yolanda Bowers's results to her and they are in the Lab tab. I offered her a return visit if she would like a more detailed explanation.   Briefly, No genetic cause for her DCM was found.   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   Her children each has a 50% chance to be a CPT2 carrier, and if one is AND his future partner is also a carrier, then their children have a 25% risk each to inherit the disease CPT2 deficiency. Her children can undergo carrier testing before having children since this does not affect their own health but does have reproductive implications.         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  Her only issue was ~19 years ago during pregnancy she experienced transient PVCs for at most a few days.   She was diagnosed with L sided breast cancer in April 2020 at age 49 (IDC, triple positive) for which she underwent neoadjuvant chemo with ddAC-THP, mastectomy, radiation therapy and herceptin/Perjeta. She tells me she underwent genetic testing for hereditary causes of breast cancer and the results were negative.  A baseline echo was normal at [facility] prior to initiating chemo. However, she became very fatigued, and developed  palpitations.  She had repeat echo in May or June that shoed a drop in her ejection fraction, and it continued to decline. Herceptin/Perjeta was discontinued. She also underwent PVC ablation 03/21/20 for PVCs from the interleaflet trigone in the LCC/RCC commisure below the aortic valve with significant improvement in her symptoms.      REVIEW OF RECORDS  From Dr. Brennan Bailey note 07/29/20  Echo: 03/28/2019:  left ventricular end-diastolic dimension of 1.0GY, EF of 60%, strain -20%, trabeculated LV, normal valves, mild LAE, normal diastology    echocardiogram from 08/14/2019:  left ventricular end-diastolic dimension of 6.9SW, EF of 59%, GLS -20%, no change from 03/28/2019    Echocardiogram from 01/02/2020:   left ventricular end-diastolic dimension of 5.4OE, EF of 51%, GLS -13%, normal RV function, normal valves, decreased function compared to 08/14/2019    Coronary angiogram from 03/20/2020:  normal    cMRI from 03/18/2020:  1. The left ventricle (LV) is mildly dilated (LVEDVi= 94 ml/m2) with low normal systolic function (LVEF= 70%). No regional wall motion abnormalities. There is no LV hypertrophy (LVmassI= 45 gm/m2).    2. The right ventricle (RV) is normal in size (RVEDVi= 83 ml/m2) with normal systolic function (RVEF= 35%).     3. On delayed enhancement imaging there is intramyocardial late gadolinium enhancement at the inferior RV attachment site.    4. On limited T2-weighted imaging there is no evidence of myocardial edema.  5. On T1-mapping, the native T1 value is 1045m (normal).    6. No significant valvular abnormalities.    7. Left atrial area measures 25cm2 and LAVI= 447mm2 (dilated) via biplanar method; Right atrial area measures 14cm2 via 4-chamber monoplanar method.+           EKG:01/11/2020:  Normal sinus rhythm, heart rate of 74 bpm, normal PR interval, premature ventricular contractions, LAE    cardiopulmonary exercise test on 05/30/2020:    Patient felt that the breathing was hard for  her  Max VO2 15 ml/kg/min (66%) , RER 1.21, normal blood pressure response to exercise, normal heart rate response, O2 pulse of 82%, 30% MVV    Echocardiogram from (07/29/2020):  Normal LV size and functoin EF of 61% with GLS of -19%, normal RV size and function, minimal MR..  EF has improved from 12/2019.    PAST MEDICAL HISTORY  See above.     FAMILY HISTORY:  See the complete pedigree taken at the visit in the family history tab.   No one with DCM or arrhythmia.   One paternal aunt with ovarian cancer. Distant Ashkenazi Jewish ancestry on maternal side.            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     ASSESSMENT:  Yolanda Bowers a [age]ear old woman with a history of dilated cardiomyopathy and PVCs which developed after treatment for breast cancer at age [age]detailed above.     I discussed with her today that she may have a genetic predisposition to DCM that she would not have known about had she not undergone treatment with chemo and Herceptin/pertuzumab for breast cancer. CHF is a known complication of pertuzumab.  I told her that we know less about the genetic predisposition to cardiomyopathy following exposure to medication than we do about cardiomyopathy that is purely genetic and occurs in young adults. She has no family history, but the one factor that increases the likelihood of a genetic cause for her is the combination of ventricular arrhythmia and DCM, as opposed to cardiomyopathy alone.      Dilated cardiomyopathy without a clear medical or environmental etiology is a current indication for genetic testing  for diagnostic purposes (American Heart Association, 2020).   Genetic testing is important for her for diagnostic purposes, may also help to stratify risk for close  family members, and for some individuals, can give more specific information about prognosis or risk of VT.      I provided pretest genetic counseling today, and I reviewed the risks, benefits, cost,  limits, outcomes (positive, negative, uncertain), implications of genetic testing for her and the family. I explained that if the results are positive, we have a useful predictive test for family members, but if the test results are negative or uncertain, then those results cannot be used to accurately stratify risk to family members.       Given the heterogeneity of hereditary causes of non-ischemic dilated cardiomyopathy, I recommend a gene panel for known genetic causes of DCM and inherited arrhythmias.  Pathogenic variants in the TTN gene are known to cause approximately 25% of familial dilated cardiomyopathy, and 18% of sporadic DCM, ventricular arrhythmia is also frequently seen in these patients.  Additional common causes include pathogenic variants in the LMNA gene, however there are many genes implicated in DCM including sarcomeric components, mitochondrial function, etc.      PLAN:    Cardiomyopathy genetic testing with a 100 gene panel for cardiomyopathies and inherited arrhythmias  through a sponsored program.  Results disclosure by Mychart. Will be available 4 weeks after she returns the kit mailed to her home.       GENETICS ATTENDING STATEMENT  I spent 65 total minutes on the care of this patient on the day of the visit including: review of medical records prior to the visit, 30 min for direct patient care during  the visit, including counseling, and remaining time following the visit for placing orders and documentation in the electronic heatlh record. Marland Kitchen

## 2020-08-29 ENCOUNTER — Telehealth (HOSPITAL_BASED_OUTPATIENT_CLINIC_OR_DEPARTMENT_OTHER): Payer: Self-pay | Admitting: Cardiovascular Disease

## 2020-08-29 NOTE — Telephone Encounter (Addendum)
Received TC from patient- she states she has been having increased palpitations lately, especially over the last two days. Today it feels like her "heart is beating out of my chest," she also reports some dizziness but denies sob. She reports not feeling stressed and has not done anything physically strenuous but has been feeling more tired after work this week.     She states she noticed more palpitations after increasing her metoprolol dose in December (instructed to take 25mg  Metoprolol daily). Recommended she take her pulse and BP, may need to be seen and get an EKG to rule out arrhythmia. Pt agrees and will contact her PCP or go to urgent care.     Will discuss with Dr [name]

## 2020-08-29 NOTE — Telephone Encounter (Signed)
Received communication from Dr. [name]:        TC to patient for update- Went over new instructions for Metoprolol and she states she has been taking one tablet twice daily, for a total of 50mg  Metoprolol daily.     Clarified instructions with patient, she should be taking one 25mg  once per day. She states she accidentally misread the instructions but will decrease her dose to one tablet per day, discussed reading the bottle when she gets home to ensure that she has the 25mg  tablets.     Dr. [name] updated via epic chat, will fu with patient next week to check in on symptoms.

## 2020-09-01 ENCOUNTER — Ambulatory Visit: Payer: Commercial Managed Care - PPO | Attending: Clinical Genetics (M.D.) | Admitting: Clinical Genetics (M.D.)

## 2020-09-01 ENCOUNTER — Encounter (HOSPITAL_BASED_OUTPATIENT_CLINIC_OR_DEPARTMENT_OTHER): Payer: Self-pay

## 2020-09-01 DIAGNOSIS — T451X5A Adverse effect of antineoplastic and immunosuppressive drugs, initial encounter: Secondary | ICD-10-CM | POA: Insufficient documentation

## 2020-09-01 DIAGNOSIS — I427 Cardiomyopathy due to drug and external agent: Secondary | ICD-10-CM | POA: Insufficient documentation

## 2020-09-01 DIAGNOSIS — I493 Ventricular premature depolarization: Secondary | ICD-10-CM | POA: Insufficient documentation

## 2020-09-04 ENCOUNTER — Ambulatory Visit
Payer: Commercial Managed Care - PPO | Attending: Physician Assistant | Admitting: Rehabilitative and Restorative Service Providers"

## 2020-09-04 ENCOUNTER — Ambulatory Visit: Payer: Commercial Managed Care - PPO | Attending: Physician Assistant

## 2020-09-04 DIAGNOSIS — Z452 Encounter for adjustment and management of vascular access device: Secondary | ICD-10-CM | POA: Insufficient documentation

## 2020-09-04 DIAGNOSIS — C50412 Malignant neoplasm of upper-outer quadrant of left female breast: Secondary | ICD-10-CM | POA: Insufficient documentation

## 2020-09-04 DIAGNOSIS — Z170 Estrogen receptor positive status [ER+]: Secondary | ICD-10-CM | POA: Insufficient documentation

## 2020-09-04 NOTE — Progress Notes (Signed)
[facility] LYMPHEDEMA INITIAL EVALUATION     VISIT COUNT     1    GENERAL VISIT INFO    Referring Provider: Teena Dunk, *  Reason for referral: Reason for Referral*: History of breast cancer. Lymphedema of L axilla/chest wall    Encounter Diagnosis   Name Primary?    Malignant neoplasm of upper-outer quadrant of left breast in female, estrogen receptor positive ([place]) Yes        Insurance: Payor: [facility] [place] [facility] / Plan: [facility] [place] OPTIONS ACCESS PPO / Product Type: PPO. 2 visits authorized 07/23/20-01/28/21 auth # [id number]    Cancer History:  Diagnosis: IDC (triple positive) of left breast  Surgery: L mastectomy and SLNB (0/5) 06/25/19  Lymph Node Removal: L axillary SLNB (0/5)  Radiation: adjuvant XRT to left chest wall completed 09/21/19  Chemotherapy: neoadjuvant chemotherapy (ddAC, THP)   Endocrine Therapy: Letrozole  Reconstruction planned? No     R breast reduction 06/25/19.      Oncology History Overview   10/2018: [name]   11/24/2018: Bilateral mammogram revealed a negative right breast. To the left breast UOQ, there was an 75m mass and a 2.4 cm mass laterally, 17 cm from the nipple. Targeted left breast ultrasound revealed at 3 oclock 8 cm from the nipple, a 21x13x252mmass and an enlarged axillary lymph node measuring 12x11x1530mThere were multiple enlarged lymph nodes  11/27/2018: US-guided biopsy of the breast mass revealed invasive ductal carcinoma, ER/PR+, HER2+, grade 3. There was angiolymphatic invasion seen on biopsy. Biopsy of axillary lymph node positive for carcinoma.   12/08/2018: Bilateral breast MRI revealed right breast at 3 oclock, 2.7cm from the nipple, a 10 x 6 x8mm59mss and at 12 oclock, 11cm from the nipple, a spiculated 16 x 4 x 13mm63ms. Ultrasound recommended.  To the left breast, mass consistent with biopsy proved carcinoma measuring 24 x 20 x 24mm.80mre was also diffuse NME involving  mid-outer breast extending over 110x45x69mm. 68min the NME, there was a small spiculated mass at 12 oclock measuring 12mm. M67mple level 1,2 and 3 lymph nodes seen, in addition to at least 3 internal mammary nodes.   12/14/2018: PET CT without evidence of distant metastasis. There are 4mm lung24mdules bilaterally.   12/29/2018: Started on neoadjuvant chemotherapy with ddAC x4 cycles.  03/09/2019: Started on neoadjuvant chemotherapy with Taxol, Herceptin, and Perjeta.  06/25/2019: Underwent left mastectomy and SLN biopsy with Dr. David ByrImagene Shellergy revealed a single focus of angiolymphatic involvement by carcinoma (< 0.1cm). Negative surgical margins. 0 of 5 lymph nodes positive for carcinoma. ypT0 ypN0.   09/21/2019: Completed XRT.  Started on letrozole 2.'5mg'$  PO daily.     01/29/2020: Given drop in LVEF, last Herceptin/Perjeta infusion was discontinued. Letrozole continued.    Negative genetic panel testing, 11/2018.         Past Medical History:   Diagnosis Date    Chemotherapy adverse reaction 06/25/2019    Allergic rhinitis due to allergen     Anemia     Anxiety     Breast cancer (HCC)     [facility]     Cancer (HCC)    [facility]     [place]      Past Surgical History:   Procedure Laterality Date    BREAST BIOPSY Right 11/2018    2 core bx      BREAST RECONSTRUCTION  Right 06/25/2019    reduction - Dr. Lowella Dell    PR MASTECTOMY SIMPLE COMPLETE Left 06/25/2019    Had radiation         Precautions: At risk for lymphedema and lymphedema  Recent cardiac ablation.     Social History & Occupation:  Married.   Occupation: Brewing technologist. High school Metallurgist.   Language:  English  Interpreter present:  NO    Current level of function/exercise (at time of eval):  Exercise class 2X/week (strength training and cardio).   Walks ~30 min/day.    Active at work.     Previous level of function/exercise (prior to cancer dx):  Not assessed    SUBJECTIVE INFORMATION:  Occasionally wakes up w/ both hands numb  (X 2 weeks).   Reports swelling in L armpit since mid November.  Had [place] 08/07/20 which ruled out DVT.  Also reports nearly constant tingling along medial L arm distally to hand.  Tingling started several weeks after being discharged from skilled PT at [place].   Scheduled to have neurology assessment of L UE (nerve conduction study?) at [facility].   Had PT at [facility] which was helpful for shoulder mobility.    Was previously diagnosed with [name] but states that she hasn't had symptoms since prior to breast cancer diagnosis.   Does self MLD and chest/shoulder stretches.   Has compression sleeve X 1 year.  Wore it during exercise. Started wearing it daily when tingling initially started.  Doesn't help w/ tingling but helps w/ swelling.  Hasn't worn it X 2 weeks.      Pain:             Fatigue:       Exercise as a Vital Sign:  Exercise Effort:  Exercise Total  How many days a week do you engage in moderate to vigorous physical activity (like a brisk walk or breathing harder than normal)?: 6  On average, each time you exercise, how many minutes do you engage in activity at this level?: 30  Total Minutes of Exercise per Week: 180    ECOG Performance Status:  (1) Restricted in physically strenuous activity, ambulatory and able to do work of light nature    Fall Risk       Lymphedema symptoms:  achy, swollen and full     Swelling Exacerbated by:  unclear     Swelling Improved by:  exercise and compression     Compression:  OTC garment     Patient goals: decrease pain and tingling in L UE.      OUTCOME MEASURES: deferred to next visit  Quick-DASH score:  (0-100 scale, higher scores indicate higher disability, minimum clinically important difference = 10 points)  Shoulder Pain and Disability Index (SPADI)score:  Pain score: %, Disability Score:  %,  Total SPADI Score: %  (0-100 scale, higher scores indicate higher disability, minimum clinically important difference = 13 points)  Lymphedema Life Impact  Scale (LLIS):  link here, % disability       OBJECTIVE:    Special Tests:   TOS: positive Roos', Allen, Adson's, and Wright's test on L     Integumentary Observation:  Mild radiation induced hyperpigmentation of L chest wall.   Very mild edema in L axilla.    Very mild tissue sponginess in L upper arm vs R.   No evidence of cording.     Integumentary Palpation:      Pitting Edema:  No flowsheet data found.  Stemmers Sign: negative       Limb Volumes: (documented in Excel)  No flowsheet data found.          Yolanda Bowers, Yolanda Bowers [id number]                At Risk -  L   09/05/2019  09/05/2019   Handed - R    R diff L   Weight (lb) R L      Hand:  Measure to mid-dorsum from tip of 3rd digit (cm) 11.8 11.8 19.1 -0.2 19.3   Wrist:  Measure to styloid from tip of 3rd digit (cm) 18.5 19.0 16.2 0.3 15.9   4 cm from wrist measurement   17.2 0.6 16.6   8 cm    19.3 -0.3 19.6   12 cm   22.3 -0.2 22.5   16 cm   24.0 0.7 23.3   20 cm   24.4 0.7 23.7   24 cm   25.0 0.5 24.5   28 cm   26.6 -1.2 27.8   32 cm   28.8 -0.8 29.6   36 cm   30.3 0.0 30.3   40 cm   31.1 -0.7 31.8   44 cm    0.0    48 cm    0.0    52 cm    0.0    Base of index finger    0.0    Base of thumb    0.0    Palm (MC heads)    0.0    Olecranon    0.0                UE ROM:  RUE AROM  (degrees):  RUE AROM 09/04/2020   R Shoulder Flexion 0-180 155   R Shoulder ABduction 0-180 125   R Shoulder External Rotation 0-90 60       LUE AROM  (degrees):  No flowsheet data found.      TREATMENT:  Self care/patient education/home management:   -reviewed self MLD. Cues for amount of pressure as well as sequencing.   - Recommended use of MD Ouida Sills self MLD video on YouTube.     Manual therapy:   - MLD, routing to R axilla and L inguinals.   - MFR to L pectorals.     Charge Info:  Total Minutes Charged:  Total Timed Minutes: 30   Total Untimed Minutes: 25   Total Minutes Combined: 55     Therapeutic Procedures:     Manual Therapy Time Entry: 10           Self-Care/Home Mgmt Training  Time Entry: 20         Assessment   Rehab Potential:  good    Functional Limitations Include:  At risk for lymphedema, Pain limiting function and Range of motion deficits    Pt is a 50 year old with h/o left sided breast cancer S/P neoadjuvant chemotherapy, mastectomy and SLNB, and post mastectomy radiation therapy.  Pt presents with Stage 1 Lymphedema: Accumulation of fluid and protein in tissue is present.  Elevation may influence the limb; pitting may be present. in the L chest wall.  Very mild.  No evidence of lymphedema in L UE, although patient is at risk. Patient has been performing self MLD from distal to proximal in L UE.  Needed cues for pressure and sequencing.   L UE paresthesias possibly related to TOS in setting of numerous positive TOS tests.  Nerve  conduction study pending.     Additionally, Pt has the following impairments:  Decreased ROM in L shoulder.  Scar tissue restrictions and/or fibrosis resulting in decreased range of motion in the L shoulder.      Pt would benefit from skilled physical therapy to address physical impairments and functional limitations noted above.     This evaluation was of low complexity due to co-morbidites, number of elements involved and stable presentation.                 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 .    Within 4 weeks, patient will be I with self MLD, skin care, and exercise recommendations to decrease infection risk.     Within 4 weeks, patient will understand lymphedema risk reduction strategies to be able to explain 3 of the main risk reduction practices in a treatment session.      The above goals and POC have been discussed and agreed upon by the patient.        Evaluation:  Low/Moderate/High Complexity  PT Evaluation (Low) Time Entry: 25 PT Eval Low    PT eval med    PT Eval High       PLAN:   Recommend patient to return for care 1x per week for 2  weeks and then reassess for ongoing needs.       Treatment Planned:  1. Manual Therapy  2. Patient education  3. Therapeutic Exercise      Plan for next visit:  1. Manual techniques and stretching to improve shoulder ROM.   2. F/u on nerve conduction study once completed.   3. Review chest/pectoral stretches prn.   4. Assess patient's compression sleeve.        Thank you for this referral.   Frederica Kuster, PT   09/04/2020  [facility]

## 2020-09-11 ENCOUNTER — Encounter (HOSPITAL_BASED_OUTPATIENT_CLINIC_OR_DEPARTMENT_OTHER): Payer: Self-pay | Admitting: Cardiovascular Disease

## 2020-09-23 ENCOUNTER — Telehealth (HOSPITAL_BASED_OUTPATIENT_CLINIC_OR_DEPARTMENT_OTHER): Payer: Self-pay

## 2020-09-23 ENCOUNTER — Ambulatory Visit
Payer: Commercial Managed Care - PPO | Attending: Physician Assistant | Admitting: Rehabilitative and Restorative Service Providers"

## 2020-09-23 ENCOUNTER — Ambulatory Visit: Payer: Commercial Managed Care - PPO | Attending: Nursing

## 2020-09-23 DIAGNOSIS — Z170 Estrogen receptor positive status [ER+]: Secondary | ICD-10-CM | POA: Insufficient documentation

## 2020-09-23 DIAGNOSIS — L905 Scar conditions and fibrosis of skin: Secondary | ICD-10-CM | POA: Insufficient documentation

## 2020-09-23 DIAGNOSIS — C50412 Malignant neoplasm of upper-outer quadrant of left female breast: Secondary | ICD-10-CM | POA: Insufficient documentation

## 2020-09-23 DIAGNOSIS — Z9189 Other specified personal risk factors, not elsewhere classified: Secondary | ICD-10-CM | POA: Insufficient documentation

## 2020-09-23 LAB — PLATELET COUNT: Platelet Count: 192 10*3/uL (ref 150–400)

## 2020-09-23 LAB — PROTHROMBIN TIME
Prothrombin INR: 1 (ref 0.8–1.3)
Prothrombin Time Patient: 12.6 s (ref 10.7–15.6)

## 2020-09-23 LAB — PARTIAL THROMBOPLASTIN TIME: Partial Thromboplastin Time: 32 s (ref 22–35)

## 2020-09-23 MED ORDER — LIDOCAINE HCL (PF) 1 % IJ SOLN
0.5000 mL | Freq: Once | INTRAMUSCULAR | Status: AC
Start: 2020-09-23 — End: 2020-09-23
  Administered 2020-09-23: 0.5 mL via INTRADERMAL

## 2020-09-23 NOTE — Progress Notes (Addendum)
[facility] L[facility]MPHEDEMA PROGRESS NOTE     VISIT COUNT     2    GENERAL VISIT INFO    Referring Provider: Teena Dunk, *  Reason for referral:      Encounter Diagnoses   Name Primary?    Malignant neoplasm of upper-outer quadrant of left breast in female, estrogen receptor positive ([place]) Yes    Scar conditions and fibrosis of skin     At risk for lymphedema         Insurance: Payor: [facility] [place] [facility] / Plan: [facility] [place] OPTIONS ACCESS PPO / Product Type: PPO. 2 visits authorized 07/23/20-01/28/21 auth # [id number]    Cancer History:  Diagnosis: IDC (triple positive) of left breast  Surgery: L mastectomy, SLNB (0/5) , and R breast reduction 06/25/19  Lymph Node Removal: L axillary SLNB (0/5)  Radiation: adjuvant XRT to left chest wall completed 09/21/19  Chemotherapy: neoadjuvant chemotherapy (ddAC, THP)   Endocrine Therapy: Letrozole  Reconstruction planned? No     R breast reduction 06/25/19.    Reports swelling in L armpit since mid November.  Had [place] 08/07/20 which ruled out DVT.  Also reports nearly constant tingling along medial L arm distally to hand.  Tingling started several weeks after being discharged from skilled PT at [place].   Had PT at [facility] which was helpful for shoulder mobility.    Was previously diagnosed with [name] but states that she hasn't had symptoms since prior to breast cancer diagnosis.     Oncology History Overview   10/2018: [name]   11/24/2018: Bilateral mammogram revealed a negative right breast. To the left breast UOQ, there was an 16m mass and a 2.4 cm mass laterally, 17 cm from the nipple. Targeted left breast ultrasound revealed at 3 oclock 8 cm from the nipple, a 21x13x248mmass and an enlarged axillary lymph node measuring 12x11x1567mThere were multiple enlarged lymph nodes  11/27/2018: US-guided biopsy of the breast mass revealed invasive ductal carcinoma, ER/PR+,  HER2+, grade 3. There was angiolymphatic invasion seen on biopsy. Biopsy of axillary lymph node positive for carcinoma.   12/08/2018: Bilateral breast MRI revealed right breast at 3 oclock, 2.7cm from the nipple, a 10 x 6 x8mm37mss and at 12 oclock, 11cm from the nipple, a spiculated 16 x 4 x 13mm37ms. Ultrasound recommended.  To the left breast, mass consistent with biopsy proved carcinoma measuring 24 x 20 x 24mm.51mre was also diffuse NME involving mid-outer breast extending over 110x45x69mm. 85min the NME, there was a small spiculated mass at 12 oclock measuring 12mm. M45mple level 1,2 and 3 lymph nodes seen, in addition to at least 3 internal mammary nodes.   12/14/2018: PET CT without evidence of distant metastasis. There are 4mm lung64mdules bilaterally.   12/29/2018: Started on neoadjuvant chemotherapy with ddAC x4 cycles.  03/09/2019: Started on neoadjuvant chemotherapy with Taxol, Herceptin, and Perjeta.  06/25/2019: Underwent left mastectomy and SLN biopsy with Dr. David ByrImagene Shellergy revealed a single focus of angiolymphatic involvement by carcinoma (< 0.1cm). Negative surgical margins. 0 of 5 lymph nodes positive for carcinoma. ypT0 ypN0.   09/21/2019: Completed XRT.  Started on letrozole 2.5mg PO da21m.     01/29/2020: Given drop in LVEF, last Herceptin/Perjeta infusion was discontinued. Letrozole continued.    Negative genetic panel testing, 11/2018.         Past Medical History:  Diagnosis Date    Chemotherapy adverse reaction 06/25/2019    Allergic rhinitis due to allergen     Anemia     Anxiety     Breast cancer ([place])     Burn     Cancer [facility])     Congestive heart failure ([place])     Headache      Past Surgical History:   Procedure Laterality Date    BREAST BIOPSY Right 11/2018    2 core bx      BREAST RECONSTRUCTION Right 06/25/2019    reduction - Dr. Lowella Dell    PR MASTECTOMY SIMPLE COMPLETE Left 06/25/2019    Had radiation         Precautions: At risk for lymphedema and  lymphedema  Recent cardiac ablation.     Social History & Occupation:  Married.   Occupation: Brewing technologist. High school Metallurgist.   Language:  English  Interpreter present:  NO    Current level of function/exercise (at time of eval):  Exercise class 2X/week (strength training and cardio).   Walks ~30 min/day.    Active at work.   Stretching: chest, lateral chest, lats, pec doorway stretch    Previous level of function/exercise (prior to cancer dx):  Not assessed    SUBJECTIVE INFORMATION:  Had consult with neurologist 09/10/20.  Nerve conduction study showed carpal tunnel syndrome on L. Did not discuss possible TOS with neurologist.   Doing self MLD. Chest wall, breast, and arm feels less congested, less sore.    Occasionally wakes up w/ both hands numb (X 2 weeks).     Has compression sleeve X 1 year.  Wore it during exercise. Started wearing it daily when tingling initially started.  Doesn't help w/ tingling but helps w/ swelling.      Pain:             Fatigue:       Exercise as a Vital Sign:  Exercise Effort:       ECOG Performance Status:  (1) Restricted in physically strenuous activity, ambulatory and able to do work of light nature    Fall Risk       Lymphedema symptoms:  achy, swollen and full     Swelling Exacerbated by:  unclear     Swelling Improved by:  exercise and compression     Compression:  OTC garment     Patient goals: decrease pain and tingling in L UE.      OUTCOME MEASURES:  Quick-DASH score:  (0-100 scale, higher scores indicate higher disability, minimum clinically important difference = 10 points)  34.1 % with moderate difficulty on questions # 4, 6, 8, 10, and 11. Severe on question #9.     Shoulder Pain and Disability Index (SPADI)score:  Pain score: %, Disability Score:  %,  Total SPADI Score: %  (0-100 scale, higher scores indicate higher disability, minimum clinically important difference = 13 points)  Lymphedema Life Impact Scale (LLIS):  link here, % disability       OBJECTIVE:         Integumentary Observation:  Mild radiation induced hyperpigmentation of L chest wall.   Very mild edema in L axilla.    Very mild tissue sponginess in L upper arm vs R.   Very small cord in central L axilla.     Integumentary Palpation:      Pitting Edema:  No flowsheet data found.    Stemmers Sign: negative  Limb Volumes: (documented in Excel)  No flowsheet data found.          Yolanda Bowers, Yolanda Bowers [id number]                At Risk -  L   09/05/2019  09/05/2019   Handed - R    R diff L   Weight (lb) R L      Hand:  Measure to mid-dorsum from tip of 3rd digit (cm) 11.8 11.8 19.1 -0.2 19.3   Wrist:  Measure to styloid from tip of 3rd digit (cm) 18.5 19.0 16.2 0.3 15.9   4 cm from wrist measurement   17.2 0.6 16.6   8 cm    19.3 -0.3 19.6   12 cm   22.3 -0.2 22.5   16 cm   24.0 0.7 23.3   20 cm   24.4 0.7 23.7   24 cm   25.0 0.5 24.5   28 cm   26.6 -1.2 27.8   32 cm   28.8 -0.8 29.6   36 cm   30.3 0.0 30.3   40 cm   31.1 -0.7 31.8   44 cm    0.0    48 cm    0.0    52 cm    0.0    Base of index finger    0.0    Base of thumb    0.0    Palm (MC heads)    0.0    Olecranon    0.0                UE ROM:  RUE AROM  (degrees):  RUE AROM 09/04/2020   R Shoulder Flexion 0-180 155   R Shoulder ABduction 0-180 125   R Shoulder External Rotation 0-90 60       LUE AROM  (degrees):  No flowsheet data found.      TREATMENT:  Self care/patient education/home management:   HEP:   - seated scalene stretch  - doorway pectoral stretch  - supine lumbar rotation w/ shoulder abd to 90/120 as pec stretch    Positioning and ergonomics:   - recommended trial of wrist splint at night for carpal tunnel symptoms.   - discussed desktop ergonomics as relates to carpal tunnel.  Recommend trial of external keyboard with laptop.     Manual therapy:   - MLD, routing to R axilla and L inguinals.   - MFR to L pectorals, scalenes, middle and upper trapezius.     Charge Info:  Total Minutes Charged:  Total Timed Minutes: 55       Total Minutes Combined:  55     Therapeutic Procedures:  Therapeutic Exercise Time Entry: 10  Manual Therapy Time Entry: 92           Self-Care/Home Mgmt Training Time Entry: 96         Assessment     Pt is a 50 year old with h/o left sided breast cancer S/P neoadjuvant chemotherapy, mastectomy and SLNB, and post mastectomy radiation therapy.  Pt presents with Stage 1 Lymphedema: Accumulation of fluid and protein in tissue is present.  Elevation may influence the limb; pitting may be present. in the L chest wall.  Very mild.  No evidence of lymphedema in L UE, although patient is at risk. Decreased subjective report of aching, fullness in L chest wall since she has been performing self MLD correctly.    L UE paresthesias, possibly related  to TOS in setting of numerous positive TOS tests.  Nerve conduction study showed carpal tunnel on L UE, per patient report, but proximal symptoms are not explained by carpal tunnel diagnosis. Recommend referral to physiatry for further assessment and management of UE paresthesias and possible TOS.   Moderate UE disability based on QuickDASH.                  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 .    Within 4 weeks, patient will be I with self MLD, skin care, and exercise recommendations to decrease infection risk.     Within 4 weeks, patient will understand lymphedema risk reduction strategies to be able to explain 3 of the main risk reduction practices in a treatment session.    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      The above goals and POC have been discussed and agreed upon by the patient.        PLAN:   Recommend patient to return for care 1x per week for 4-6 weeks.   Will request authorization for additional visits.    Consider referral to onco-physiatrist (Dr. Warner Mccreedy)    Treatment Planned:  1. Manual Therapy  2. Patient education  3. Therapeutic Exercise      Plan for next visit:  1. Manual techniques and stretching to L neck, chest wall.    2. Review chest/pectoral stretches prn.   3. F/u on use of wrist splint and ergonomic modifications.          Thank you for this referral.   Frederica Kuster, PT   09/23/2020  [facility]

## 2020-09-24 ENCOUNTER — Encounter (HOSPITAL_BASED_OUTPATIENT_CLINIC_OR_DEPARTMENT_OTHER): Payer: Commercial Managed Care - PPO | Admitting: Vascular & Interventional Radiology

## 2020-09-24 ENCOUNTER — Encounter (HOSPITAL_BASED_OUTPATIENT_CLINIC_OR_DEPARTMENT_OTHER): Payer: Self-pay | Admitting: Clinical Genetics (M.D.)

## 2020-10-06 ENCOUNTER — Ambulatory Visit: Payer: Commercial Managed Care - PPO | Attending: Medical Oncology | Admitting: Medical Oncology

## 2020-10-06 ENCOUNTER — Encounter (HOSPITAL_BASED_OUTPATIENT_CLINIC_OR_DEPARTMENT_OTHER): Payer: Self-pay | Admitting: Medical Oncology

## 2020-10-06 ENCOUNTER — Ambulatory Visit (HOSPITAL_BASED_OUTPATIENT_CLINIC_OR_DEPARTMENT_OTHER): Payer: Commercial Managed Care - PPO

## 2020-10-06 DIAGNOSIS — Z170 Estrogen receptor positive status [ER+]: Secondary | ICD-10-CM

## 2020-10-06 DIAGNOSIS — C50412 Malignant neoplasm of upper-outer quadrant of left female breast: Secondary | ICD-10-CM | POA: Insufficient documentation

## 2020-10-06 DIAGNOSIS — Z9012 Acquired absence of left breast and nipple: Secondary | ICD-10-CM | POA: Insufficient documentation

## 2020-10-06 LAB — COMPREHENSIVE METABOLIC PANEL
ALT (GPT): 13 U/L (ref 7–33)
AST (GOT): 17 U/L (ref 9–38)
Albumin: 4.1 g/dL (ref 3.5–5.2)
Alkaline Phosphatase (Total): 43 U/L (ref 34–121)
Anion Gap: 4 (ref 4–12)
Bilirubin (Total): 0.3 mg/dL (ref 0.2–1.3)
Calcium: 9.4 mg/dL (ref 8.9–10.2)
Carbon Dioxide, Total: 31 meq/L (ref 22–32)
Chloride: 106 meq/L (ref 98–108)
Creatinine: 0.69 mg/dL (ref 0.38–1.02)
Glucose: 81 mg/dL (ref 62–125)
Potassium: 4.2 meq/L (ref 3.6–5.2)
Protein (Total): 6.5 g/dL (ref 6.0–8.2)
Sodium: 141 meq/L (ref 135–145)
Urea Nitrogen: 12 mg/dL (ref 8–21)
eGFR by CKD-EPI: >60 mL/min/{1.73_m2} (ref >59)

## 2020-10-06 LAB — CBC, ABS NEUTROPHIL
Hematocrit: 33 % — ABNORMAL LOW (ref 36.0–45.0)
Hemoglobin: 11.4 g/dL — ABNORMAL LOW (ref 11.5–15.5)
Immature Granulocytes: 0.01 10*3/uL (ref 0.00–0.05)
MCH: 30.4 pg (ref 27.3–33.6)
MCHC: 34.9 g/dL (ref 32.2–36.5)
MCV: 87 fL (ref 81–98)
Neutrophils: 1.85 10*3/uL (ref 1.80–7.00)
Platelet Count: 255 10*3/uL (ref 150–400)
RBC: 3.75 10*6/uL — ABNORMAL LOW (ref 3.80–5.00)
RDW-CV: 12.3 % (ref 11.6–14.4)
WBC: 3.04 10*3/uL — ABNORMAL LOW (ref 4.3–10.0)

## 2020-10-06 LAB — TSH WITH REFLEXIVE FREE T4: TSH with Reflexive Free T4: 0.784 u[IU]/mL (ref 0.400–5.000)

## 2020-10-06 MED ORDER — LIDOCAINE HCL (PF) 1 % IJ SOLN
0.5000 mL | Freq: Once | INTRAMUSCULAR | Status: AC
Start: 2020-10-06 — End: 2020-10-06
  Administered 2020-10-06: 0.5 mL via INTRADERMAL

## 2020-10-06 NOTE — Progress Notes (Signed)
[facility]  BREAST ONCOLOGY CLINIC NOTE    IDENTIFICATION  Yolanda Bowers is a 50 year old female with history of cT3N3 left breast invasive ductal carcinoma, ER/PR/HER2+, s/p neoadjuvant chemotherapy with ddAC-THP, left mastectomy and SLN biopsy, and adjuvant radiation therapy. She had a complete pathologic response at the time of surgery. Presents to clinic today for labs and follow up while on letrozole.     ONCOLOGIC HISTORY     Oncology History Overview Note   10/2018: [name]   11/24/2018: Bilateral mammogram revealed a negative right breast. To the left breast UOQ, there was an 51m mass and a 2.4 cm mass laterally, 17 cm from the nipple. Targeted left breast ultrasound revealed at 3 oclock 8 cm from the nipple, a 21x13x252mmass and an enlarged axillary lymph node measuring 12x11x1559mThere were multiple enlarged lymph nodes  11/27/2018: US-guided biopsy of the breast mass revealed invasive ductal carcinoma, ER/PR+, HER2+, grade 3. There was angiolymphatic invasion seen on biopsy. Biopsy of axillary lymph node positive for carcinoma.   12/08/2018: Bilateral breast MRI revealed right breast at 3 oclock, 2.7cm from the nipple, a 10 x 6 x8mm98mss and at 12 oclock, 11cm from the nipple, a spiculated 16 x 4 x 13mm37ms. Ultrasound recommended.  To the left breast, mass consistent with biopsy proved carcinoma measuring 24 x 20 x 24mm.60mre was also diffuse NME involving mid-outer breast extending over 110x45x69mm. 30min the NME, there was a small spiculated mass at 12 oclock measuring 12mm. M74mple level 1,2 and 3 lymph nodes seen, in addition to at least 3 internal mammary nodes.   12/14/2018: PET CT without evidence of distant metastasis. There are 4mm lung33mdules bilaterally.   12/29/2018: Started on neoadjuvant chemotherapy with ddAC x4 cycles.  03/09/2019: Started on neoadjuvant chemotherapy with Taxol, Herceptin, and  Perjeta.  06/25/2019: Underwent left mastectomy and SLN biopsy with Dr. David ByrImagene Shellergy revealed a single focus of angiolymphatic involvement by carcinoma (< 0.1cm). Negative surgical margins. 0 of 5 lymph nodes positive for carcinoma. ypT0 ypN0.   09/21/2019: Completed XRT.  Started on letrozole 2.5mg PO da21m.     01/29/2020: Given drop in LVEF, last Herceptin/Perjeta infusion was discontinued. Letrozole continued.    Negative genetic panel testing, 11/2018.     Malignant neoplasm of upper-outer quadrant of left breast in female, estrogen receptor positive (HCC)   3/1[place]021 - 01/29/2020 Systemic Therapy    pertuzumab (Perjeta) 840 mg in sodium chloride 0.9 % 250 mL IVPB, Intravenous, 4 of 6 cycles  trastuzumab (Herceptin) 593 mg in sodium chloride 0.9 % 250 mL IVPB, Intravenous, 4 of 6 cycles     11/18/2019 Initial Diagnosis    Malignant neoplasm of upper-outer quadrant of left breast in female, estrogen receptor positive (HCC)      [place] INTERVAL HISTORY  Yolanda Bowers returns overall well. Her EF improved and she is going through a cardiac work up, including with medical genetics. She is on dosing of cardiac meds on which she feels quite well. She is gaining stamina and has days where she feels completely normal/recovered, other days with fatigue. She is taking exercise classes and walking, including rolling hills. 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  [place] is at college, but comes back from [place] to visit which is great for her  younger son, who is struggling a bit as a teen. Her husband has been in [place] guiding, and now in [place]. He is OK, she thinks, not sure, as he saw ENT for a neck problem and did not f/u .    As far as her letrozole therapy, she believed she is tolerating it well. She takes it daily, and has noticed improvement in her previous join pain. She takes Effexor for her associated hot flashes, which has also been  helping her mood. Due to her work stress her mood has been down, and she sees a Social worker at [employer] for this.     She is pleased her hair is growing, and that it has some curl to it, she is also wanting to keep getting stronger, and more fit. She wonders what to eat to rebuild her red cells.    ECOG Status: (0) Fully active, able to carry on all predisease performance without restriction     REVIEW OF SYSTEMS  A complete review of systems was conducted and is negative in detail, except as noted in the interval history.    PAST MEDICAL HISTORY  Patient Active Problem List   Diagnosis    Macromastia    Malignant neoplasm of upper-outer quadrant of left breast in female, estrogen receptor positive ([place])    CIN I (cervical intraepithelial neoplasia I)    Chemotherapy induced cardiomyopathy ([place])    PVC's (premature ventricular contractions)        PAST SURGICAL HISTORY  Past Surgical History:   Procedure Laterality Date    BREAST BIOPSY Right 11/2018    2 core bx      BREAST RECONSTRUCTION Right 06/25/2019    reduction - Dr. Lowella Dell    PR MASTECTOMY SIMPLE COMPLETE Left 06/25/2019    Had radiation        ALLERGIES  Review of patient's allergies indicates:  Allergies   Allergen Reactions    Oxycodone Skin: Itching    Codeine QV:ZDGLOV/FIEPPIRJ    Lisinopril Resp: Cough    Walnut Skin: Rash    Paclitaxel Skin: Hives        MEDICATIONS    Current Outpatient Medications:     acetaminophen 500 MG tablet, Take 1-2 tabs by mouth once daily as needed for pain., Disp: , Rfl:     aspirin 81 MG EC tablet, Take 81 mg by mouth., Disp: , Rfl:     Calcium Citrate-Vitamin D (CITRACAL + D OR), Take 1 tablet by mouth daily., Disp: , Rfl:     cetirizine 10 MG tablet, Take 10 mg by mouth daily as needed.  (Patient not taking: Reported on 09/01/2020), Disp: , Rfl:     famotidine 20 MG tablet, Take 1 tablet (20 mg) by mouth daily as needed for indigestion/heartburn., Disp: , Rfl:     letrozole 2.5 MG tablet, Take 1 tablet  (2.5 mg) by mouth daily., Disp: 90 tablet, Rfl: 3    metoprolol succinate ER 25 MG 24 hr tablet, Take 1 tablet (25 mg) by mouth daily. Do not chew or crush., Disp: 90 tablet, Rfl: 3    valsartan 40 MG tablet, Take 1 tablet (40 mg) by mouth 2 times a day., Disp: 180 tablet, Rfl: 3    venlafaxine ER 75 MG 24 hr capsule, Take 1 capsule (75 mg) by mouth every morning., Disp: 90 capsule, Rfl: 0     SOCIAL HISTORY:   Social History     Tobacco Use    Smoking status: Never  Smoker    Smokeless tobacco: Never Used   Substance Use Topics    Alcohol use: Not Currently    Drug use: Not Currently        FAMILY HISTORY:    Cancer-related family history includes Ovarian Cancer (age of onset: 42) in her paternal aunt; Skin Cancer in her father and maternal grandfather.     PHYSICAL EXAMINATION   VITAL SIGNS: BP 104/66    Pulse 70    Temp 36.7 C (Oral)    Resp 16    Wt 69.2 kg (152 lb 7.2 oz)    SpO2 98%    BMI 24.61 kg/m       GENERAL: Well appearing, well developed female in no acute distress.  HEENT: Normocephalic, atraumatic. Sclerae anicteric. Pupils are equal, round.   NECK: Neck is supple without any appreciable lymphadenopathy or thyromegaly.  LUNGS: Respirations regular and unlabored. Clear to auscultation bilaterally.  CARDIAC: Regular rate and rhythm. No murmurs, gallops, or rubs.   BREASTS: Left breast s/p mastectomy with well healed surgical scars. Right breast s/p reduction. No palpable masses, tenderness, or lesions. Nipple everted without discharge. No appreciable axillary lymphadenopathy bilaterally.  ABDOMEN: Soft, non-tender, non-distended. Normoactive bowel sounds. No hepatosplenomegaly or masses appreciated.  EXTREMITIES: No edema, clubbing, or cyanosis.  SKIN: No visible rash, bruising, or petechiae.  MSK: Without muscle or joint deformities.   NEUROLOGICAL: Alert and oriented. Mental status appropriate.   NUTRITIONAL STATUS: Adequate.    LABORATORY  Results for orders placed or performed in visit on  10/06/20   Comprehensive Metabolic Panel   Result Value Ref Range    Sodium 141 135 - 145 meq/L    Potassium 4.2 3.6 - 5.2 meq/L    Chloride 106 98 - 108 meq/L    Carbon Dioxide, Total 31 22 - 32 meq/L    Anion Gap 4 4 - 12    Glucose 81 62 - 125 mg/dL    Urea Nitrogen 12 8 - 21 mg/dL    Creatinine 0.69 0.38 - 1.02 mg/dL    Protein (Total) 6.5 6.0 - 8.2 g/dL    Albumin 4.1 3.5 - 5.2 g/dL    Bilirubin (Total) 0.3 0.2 - 1.3 mg/dL    Calcium 9.4 8.9 - 10.2 mg/dL    AST (GOT) 17 9 - 38 U/L    Alkaline Phosphatase (Total) 43 34 - 121 U/L    ALT (GPT) 13 7 - 33 U/L    eGFR, Calculated >60 >59 mL/min/[1.73_m2]    GFR, Information       Calculated GFR by CKD-EPI equation. Inaccurate with changing renal function. See https://testguide.labmed.https://wilkins.com/   CBC, Abs Neutrophil   Result Value Ref Range    WBC 3.04 (L) 4.3 - 10.0 10*3/uL    RBC 3.75 (L) 3.80 - 5.00 10*6/uL    Hemoglobin 11.4 (L) 11.5 - 15.5 g/dL    Hematocrit 33 (L) 36.0 - 45.0 %    MCV 87 81 - 98 fL    MCH 30.4 27.3 - 33.6 pg    MCHC 34.9 32.2 - 36.5 g/dL    Platelet Count 255 150 - 400 10*3/uL    RDW-CV 12.3 11.6 - 14.4 %    Absolute Neutrophil Comment Comment not required     Neutrophils 1.85 1.80 - 7.00 10*3/uL    Immature Granulocytes 0.01 0.00 - 0.05 10*3/uL    RBC Morphology See RBC data     Platelet Morphology See PLT count     WBC  Morphology See Diff         IMAGING  Imaging Results:  [place] Upper Extremity Venous Duplex Left ([facility])  Narrative: EXAMINATION:  USB 32 Exam: Complete Doppler  US UPPER EXTREMITY VENOUS DUPLEX LEFT    CLINICAL INDICATION:  r/o clot, pain and swelling to her axilla/arm, left     TECHNIQUE:  Duplex and color Doppler evaluation of arterial and venous inflow and outflow.  Region of interest: Left Upper Arm    COMPARISON:   None.    FINDINGS:  Normal grayscale of the left internal jugular, innominate, subclavian, axillary, brachial, basilic, cephalic, ulnar and radial veins. The veins are compressible. The color and spectral doppler  are patent with anterograde waveforms.   Impression: No evidence of left upper extremity deep venous thrombosis.       ASSESSMENT/PLAN  Yolanda Bowers is a 50 year old female with history of cT3N3 left breast invasive ductal carcinoma, ER/PR/HER2+, s/p neoadjuvant chemotherapy with ddAC-THP, left mastectomy and SLN biopsy, and adjuvant radiation therapy. She had a complete pathologic response at the time of surgery. Presents to clinic today for labs and follow up while on adjuvant  letrozole     Anemia:  Suspect this is just slow to resolve post chemo, check TSH (normal, was normal 6 mo ago), and encourage healthy lifestyle as she is already doing-- building back stamina with exercise and healthy eating. Glad heart issues are managed effectively.     Breast Cancer:   Sinda states she is tolerating her letrozole well, and we'll continue, f/u estradiol to confirm she's persistently in menopause.  -Surveillance Imaging/Mammogram/MRI: right mammogram due 03/2021       Bone Health:  -Dexa: scan on 3/21 showing osteopenia, due again 10/2021     Psych:    -Patient working with counselor at [facility], continued to encourage reaching out if things worsen.   Survivorship:  - Counseled on regular exercise and dietary measures to reduce risk of recurrence.    DISPOSITION  Patient to return to clinic in 3-4 months for follow up and surveillance. Patient encouraged to contact clinic in the interim with any questions or concerns.

## 2020-10-06 NOTE — Progress Notes (Signed)
PORT Accessed using sterile technique , Blood draw, Flushed, DeAccessed and Patient tolerated well today.  See flowsheet for details.

## 2020-10-08 ENCOUNTER — Encounter (HOSPITAL_BASED_OUTPATIENT_CLINIC_OR_DEPARTMENT_OTHER): Payer: Self-pay | Admitting: Medical Oncology

## 2020-10-08 NOTE — Telephone Encounter (Signed)
These are normal. High TSH = "low thyroid"   She does not need thyroid treatment

## 2020-10-16 ENCOUNTER — Other Ambulatory Visit (HOSPITAL_BASED_OUTPATIENT_CLINIC_OR_DEPARTMENT_OTHER): Payer: Self-pay | Admitting: Physician Assistant

## 2020-10-16 DIAGNOSIS — C50412 Malignant neoplasm of upper-outer quadrant of left female breast: Secondary | ICD-10-CM

## 2020-10-16 DIAGNOSIS — Z170 Estrogen receptor positive status [ER+]: Secondary | ICD-10-CM

## 2020-10-17 ENCOUNTER — Telehealth (HOSPITAL_BASED_OUTPATIENT_CLINIC_OR_DEPARTMENT_OTHER): Payer: Self-pay

## 2020-10-17 NOTE — Telephone Encounter (Signed)
Oncology Telephone Triage Note      Subjective / Visit Information:    Reason for Call: Medical Questions (fatigue/labs)      Assessment and Plan:     Call Summary: she wants to know what is causing her anemia. Its been on her mind a while, since her last visit. She says she is reading too much. She's scared she might have leukemia. She feels exhausted the last 4 days.     She mentioned she needed iron while pregnant with her boys and said we looked at her iron levels a while back.     Recommend she f/u with PCP. I see we drew TSH, reviewed with her as she said she didn't understand what It meant. I asked her to fwd labs to PCP for work up and encouraged her train of thought to stop reading the internet.    Results for IRELYN, PERFECTO (MRN [id number]) as of 10/17/2020 15:57   Ref. Range 10/06/2020 16:05   Thyroid Stimulating Hormone Latest Ref Range: 0.400 - 5.000 u[IU]/mL 0.784   Results for BONNI, NEUSER (MRN [id number]) as of 10/17/2020 15:57   Ref. Range 10/06/2020 13:49   Sodium Latest Ref Range: 135 - 145 meq/L 141   Potassium Latest Ref Range: 3.6 - 5.2 meq/L 4.2   Chloride Latest Ref Range: 98 - 108 meq/L 106   Carbon Dioxide, Total Latest Ref Range: 22 - 32 meq/L 31   Anion Gap Latest Ref Range: 4 - 12  4   Glucose Latest Ref Range: 62 - 125 mg/dL 81   Urea Nitrogen Latest Ref Range: 8 - 21 mg/dL 12   Creatinine Latest Ref Range: 0.38 - 1.02 mg/dL 0.69   eGFR, Calculated Latest Ref Range: >59 mL/min/1.73_m2 >60   GFR, Information Unknown Calculated GFR by CKD-EPI equation. Inaccurate with changing renal function. See https://testguid...   Calcium Latest Ref Range: 8.9 - 10.2 mg/dL 9.4   AST (GOT) Latest Ref Range: 9 - 38 U/L 17   ALT (GPT) Latest Ref Range: 7 - 33 U/L 13   Alkaline Phosphatase (Total) Latest Ref Range: 34 - 121 U/L 43   Bilirubin (Total) Latest Ref Range: 0.2 - 1.3 mg/dL 0.3   Protein (Total) Latest Ref Range: 6.0 - 8.2 g/dL 6.5   Albumin Latest Ref Range: 3.5 - 5.2 g/dL 4.1   WBC  Latest Ref Range: 4.3 - 10.0 10*3/uL 3.04 (L)   RBC Latest Ref Range: 3.80 - 5.00 10*6/uL 3.75 (L)   Hemoglobin Latest Ref Range: 11.5 - 15.5 g/dL 11.4 (L)   Hematocrit Latest Ref Range: 36.0 - 45.0 % 33 (L)   MCV Latest Ref Range: 81 - 98 fL 87   MCH Latest Ref Range: 27.3 - 33.6 pg 30.4   MCHC Latest Ref Range: 32.2 - 36.5 g/dL 34.9   Platelet Count Latest Ref Range: 150 - 400 10*3/uL 255   RDW-CV Latest Ref Range: 11.6 - 14.4 % 12.3   Neutrophils Latest Ref Range: 1.80 - 7.00 10*3/uL 1.85   Absolute Neutrophil Comment Unknown Comment not required   Immature Granulocytes Latest Ref Range: 0.00 - 0.05 10*3/uL 0.01   WBC Morphology Unknown See Diff   RBC Morphology Unknown See RBC data   Platelet Morphology Unknown See PLT count       Action Taken: Provider Notified         Response: No further questions      Additional documentation may exist in Flowsheets or Education Activity

## 2020-10-20 ENCOUNTER — Other Ambulatory Visit (HOSPITAL_BASED_OUTPATIENT_CLINIC_OR_DEPARTMENT_OTHER): Payer: Self-pay | Admitting: Medical Oncology

## 2020-10-22 ENCOUNTER — Telehealth (HOSPITAL_BASED_OUTPATIENT_CLINIC_OR_DEPARTMENT_OTHER): Payer: Self-pay

## 2020-10-22 ENCOUNTER — Ambulatory Visit: Payer: Commercial Managed Care - PPO | Attending: Physician Assistant

## 2020-10-22 DIAGNOSIS — Z170 Estrogen receptor positive status [ER+]: Secondary | ICD-10-CM | POA: Insufficient documentation

## 2020-10-22 DIAGNOSIS — C50412 Malignant neoplasm of upper-outer quadrant of left female breast: Secondary | ICD-10-CM

## 2020-10-22 LAB — PROTHROMBIN TIME
Prothrombin INR: 0.9 (ref 0.8–1.3)
Prothrombin Time Patient: 12.1 s (ref 10.7–15.6)

## 2020-10-22 LAB — PARTIAL THROMBOPLASTIN TIME: Partial Thromboplastin Time: 29 s (ref 22–35)

## 2020-10-22 LAB — PLATELET COUNT: Platelet Count: 196 10*3/uL (ref 150–400)

## 2020-10-23 ENCOUNTER — Ambulatory Visit
Admit: 2020-10-23 | Discharge: 2020-10-23 | Disposition: A | Discharge status: Home / Self Care | Payer: Commercial Managed Care - PPO | Attending: Vascular & Interventional Radiology | Admitting: Vascular & Interventional Radiology

## 2020-10-23 DIAGNOSIS — Z170 Estrogen receptor positive status [ER+]: Secondary | ICD-10-CM

## 2020-10-23 DIAGNOSIS — Z452 Encounter for adjustment and management of vascular access device: Secondary | ICD-10-CM | POA: Insufficient documentation

## 2020-10-23 DIAGNOSIS — C50412 Malignant neoplasm of upper-outer quadrant of left female breast: Secondary | ICD-10-CM

## 2020-10-23 MED ORDER — NALOXONE HCL 0.4 MG/ML IJ SOLN
0.0400 mg | INTRAMUSCULAR | Status: DC | PRN
Start: 2020-10-23 — End: 2020-10-25

## 2020-10-23 MED ORDER — FLUMAZENIL 0.5 MG/5ML IV SOLN
0.2000 mg | INTRAVENOUS | Status: DC | PRN
Start: 2020-10-23 — End: 2020-10-25

## 2020-10-23 MED ORDER — FENTANYL CITRATE (PF) 100 MCG/2ML IJ SOLN
INTRAMUSCULAR | Status: AC
Start: 2020-10-23 — End: ?
  Filled 2020-10-23: qty 4

## 2020-10-23 MED ORDER — ONDANSETRON HCL 4 MG/2ML IJ SOLN
4.0000 mg | INTRAMUSCULAR | Status: DC | PRN
Start: 2020-10-23 — End: 2020-10-25

## 2020-10-23 MED ORDER — FENTANYL CITRATE (PF) 100 MCG/2ML IJ SOLN
25.0000 ug | INTRAMUSCULAR | Status: DC | PRN
Start: 2020-10-23 — End: 2020-10-25
  Administered 2020-10-23 (×2): 50 ug via INTRAVENOUS

## 2020-10-23 MED ORDER — LIDOCAINE HCL (PF) 1 % IJ SOLN
INTRAMUSCULAR | Status: AC
Start: 2020-10-23 — End: ?
  Filled 2020-10-23: qty 30

## 2020-10-23 MED ORDER — MIDAZOLAM HCL 2 MG/2ML IJ SOLN
0.5000 mg | INTRAMUSCULAR | Status: DC | PRN
Start: 2020-10-23 — End: 2020-10-25
  Administered 2020-10-23 (×2): 1 mg via INTRAVENOUS

## 2020-10-23 MED ORDER — LIDOCAINE-EPINEPHRINE 1 %-1:100000 IJ SOLN
INTRAMUSCULAR | Status: AC
Start: 2020-10-23 — End: ?
  Filled 2020-10-23: qty 30

## 2020-10-23 MED ORDER — MIDAZOLAM HCL 2 MG/2ML IJ SOLN
INTRAMUSCULAR | Status: AC
Start: 2020-10-23 — End: ?
  Filled 2020-10-23: qty 4

## 2020-10-23 NOTE — H&P (Signed)
Moderate Sedation Pre-Procedure Assessment/H&P    Vitals:   Vitals (Most recent in last 24 hrs)     T: (not recorded)  BP: (not recorded)  HR: (not recorded)  RR: (not recorded)  SpO2: (not recorded) Room air  T range: No data recorded  Admit weight: (not recorded)  Last weight: (not recorded)     Procedure  IR ANGIO INDWELLING PORT REMOVAL [APORTR]    Sedation RN Data     Patient Language English     HISTORY   Problem List   Diagnosis    Macromastia    Malignant neoplasm of upper-outer quadrant of left breast in female, estrogen receptor positive ([place])    CIN I (cervical intraepithelial neoplasia I)    Chemotherapy induced cardiomyopathy ([place])    PVC's (premature ventricular contractions)       Past Surgical History:   Procedure Laterality Date    BREAST BIOPSY Right 11/2018    2 core bx      BREAST RECONSTRUCTION Right 06/25/2019    reduction - Dr. Lowella Dell    PR MASTECTOMY SIMPLE COMPLETE Left 06/25/2019    Had radiation       Social History     Tobacco Use    Smoking status: Never Smoker    Smokeless tobacco: Never Used   Substance and Sexual Activity    Alcohol use: Not Currently    Drug use: Not Currently    Sexual activity: Not on file   Social History Narrative    Lives in [place] with her husband and two children, ages 19 and 60. She works as an Metallurgist.        Family History   Problem Relation Age of Onset    Alcohol/Drug Maternal Grandfather     Diabetes Mother     Hearing Loss Father      Mother     Obesity Maternal Grandfather      Mother     Ovarian Cancer Paternal Aunt 49    Skin Cancer Father      Maternal Grandfather           Cannot display prior to admission medications because the patient has not been admitted in this contact.       Inpatient Medications   SCHEDULED MEDICATIONS:       INFUSED MEDICATIONS:       PRN MEDICATIONS:    fentaNYL PF, 25-50 mcg, q5 min PRN    flumazenil, 0.2 mg, q1 min PRN    midazolam, 0.5-1 mg, q5 min PRN    naloxone, 0.04 mg, q3 min PRN     ondansetron, 4 mg, q10 min PRN       Anticoagulants   Is patient on anticoagulation meds?: No  Anticoagulation labs and last medication admin verified?: (not recorded)       NPO Status   Date of Last Liquid: 10/23/20  Time of Last Liquid: 0730  Date of Last Solid: 10/22/20  Time of Last Solid: 2200     Activity Tolerance   Assessment of climbing up 2 flights of stairs: Able to climb withOUT difficulty  Assessment of walking 2 blocks: Able to walk withOUT difficulty     Escort/Ride Home Info   Escort/Ride home contact name: (not recorded)  Escort/Ride home contact relationship: (not recorded)  Escort/Ride home contact number(s): (not recorded)  Escort/Ride home contact location: (not recorded)     Sedation Provider Statement  I concur with the RN's pre-procedure assessment above.  [facility] PRESEDATION PE    Risk Factors  Status: Low:  ASA II.  Patient with mild systemic disease    Assessment: Okay for sedation    Plan for Sedation: Moderate sedation.    Attestation  I am a Solicitor (Attending, ARNP, PA-C, [facility] RN) with privileges for moderate sedation.        Results Review    Lab Results   Component Value Date    WBC 3.04 (L) 10/06/2020    HEMOGLOBIN 11.4 (L) 10/06/2020    HEMATOCRIT 33 (L) 10/06/2020    PLATELET 196 10/22/2020    SODIUM 141 10/06/2020    POTASSIUM 4.2 10/06/2020    BUN 12 10/06/2020    CREATININE 0.69 10/06/2020    GLUCOSE 81 10/06/2020    INR 0.9 10/22/2020    PROTIME 12.1 10/22/2020    PTT 29 10/22/2020    AST 17 10/06/2020    ALT 13 10/06/2020    ALK 43 10/06/2020    ALBUMIN 4.1 10/06/2020

## 2020-10-23 NOTE — Brief Procedure Note (Signed)
IR Post Procedure Assessment    Procedure: IR ANGIO INDWELLING PORT REMOVAL    Operators:  Attending, Shaan Rhoads R Zhoey Blackstock, MD    Findings:  Status post port removal    Specimen/Sample: None     Drains: None     Complications: none    Estimated blood loss:  < 25 mL    Post Procedure Diagnosis:  Pending     Plan/Recommendations:  Bedrest with head of bed elevated to 30 degrees

## 2020-10-28 ENCOUNTER — Encounter (HOSPITAL_BASED_OUTPATIENT_CLINIC_OR_DEPARTMENT_OTHER): Payer: Self-pay | Admitting: Cardiovascular Disease

## 2020-10-28 ENCOUNTER — Ambulatory Visit: Payer: Commercial Managed Care - PPO | Attending: Cardiovascular Disease | Admitting: Cardiovascular Disease

## 2020-10-28 DIAGNOSIS — F419 Anxiety disorder, unspecified: Secondary | ICD-10-CM | POA: Insufficient documentation

## 2020-10-28 DIAGNOSIS — N62 Hypertrophy of breast: Secondary | ICD-10-CM

## 2020-10-28 DIAGNOSIS — I427 Cardiomyopathy due to drug and external agent: Secondary | ICD-10-CM | POA: Insufficient documentation

## 2020-10-28 DIAGNOSIS — I493 Ventricular premature depolarization: Secondary | ICD-10-CM

## 2020-10-28 DIAGNOSIS — T451X5A Adverse effect of antineoplastic and immunosuppressive drugs, initial encounter: Secondary | ICD-10-CM | POA: Insufficient documentation

## 2020-10-28 NOTE — Progress Notes (Signed)
Distant Site Telemedicine Encounter    I conducted this encounter from [facility] via secure, live, face-to-face video conference with the patient. Meganne was located at home.  Prior to the interview, the risks and benefits of telemedicine were discussed with the patient and verbal consent was obtained.      Date of clinic:  10/28/2020  Time of visit:  1015am    ID  Mliss Maelyn Berrey is a 50 year old female with history of high-grade infiltrating ductal carcinoma of the left breast (ER+/PR+, FISH +) with angiolymphatic invasion, history of neoadjuvant chemotherapy with ddAC-THP, left mastectomy with lymph node biopsy, XRT, received 4 or 6 cycles of herceptin and Perjeta due to drop in EF, history of PVC ablation on 03/21/2020, here for follow-up.    HISTORY:  Last seen by telemedicine on 07/29/2020.  She saw Dr. Leland Johns for input regarding genetic causes of cardiomyopathy.  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    She is doing better  Improved endurance.  She is working full time, which still wipes her out.  She takes a lot of naps.  However, she is able to climb stairs.    Got port removed.      Depression/anxiety under good control    Having night sweats-->  Continuing to monitor.      She is noting more constipation causing bloating.  No abdominal pain.    She denies any palpitations, dizziness, lightheadedness, presycopal/syncopal spells.      She has intermittent sharp pain in the left chest with pleuritic component--not constant.  No fevers/chills, redness in the site.    She is participating in exercise program.  Did not meet criteria for the ACTIVATE study--breast cancer survivors and exercise    Patient Active Problem List    Diagnosis Date Noted    Anxiety [F41.9] 10/28/2020    PVC's (premature ventricular contractions) [I49.3] 02/18/2020     Ablation on 03/21/2020:   Successful ablation for PVCs from the  interleaflet trigone in the LCC/RCC commisure below the aortic valve      Chemotherapy induced cardiomyopathy ([place]) [I42.7, T45.1X5A] 01/19/2020     Echo: 03/28/2019:  left ventricular end-diastolic dimension of 7.8LF, EF of 60%, strain -20%, trabeculated LV, normal valves, mild LAE, normal diastology    echocardiogram from 08/14/2019:  left ventricular end-diastolic dimension of 8.1OF, EF of 59%, GLS -20%, no change from 03/28/2019    Echocardiogram from 01/02/2020:   left ventricular end-diastolic dimension of 7.5ZW, EF of 51%, GLS -13%, normal RV function, normal valves, decreased function compared to 08/14/2019    Coronary angiogram from 03/20/2020:  normal    cMRI from 03/18/2020:  1. The left ventricle (LV) is mildly dilated (LVEDVi= 94 ml/m2) with low normal systolic function (LVEF= 25%). No regional wall motion abnormalities. There is no LV hypertrophy (LVmassI= 45 gm/m2).    2. The right ventricle (RV) is normal in size (RVEDVi= 83 ml/m2) with normal systolic function (RVEF= 85%).     3. On delayed enhancement imaging there is intramyocardial late gadolinium enhancement at the inferior RV attachment site.    4. On limited T2-weighted imaging there is no evidence of myocardial edema.    5. On T1-mapping, the native T1 value is  1071ms (normal).    6. No significant valvular abnormalities.    7. Left atrial area measures 25cm2 and LAVI= 67mL/m2 (dilated) via biplanar method; Right atrial  area measures 14cm2 via 4-chamber monoplanar method.          Malignant neoplasm of upper-outer quadrant of left breast in female, estrogen receptor positive ([place]) [C50.412, Z17.0] 11/18/2019    Macromastia [N62] 08/31/2019    CIN I (cervical intraepithelial neoplasia I) [N87.0] 06/01/2018     Review of patient's allergies indicates:  Allergies   Allergen Reactions    Oxycodone Skin: Itching    Codeine UY:QIHKVQ/QVZDGLOV    Lisinopril Resp: Cough    Walnut Skin: Rash    Paclitaxel Skin: Hives     Current Outpatient  Medications   Medication Sig Dispense Refill    acetaminophen 500 MG tablet Take 1-2 tabs by mouth once daily as needed for pain.      aspirin 81 MG EC tablet Take 81 mg by mouth.      Calcium Citrate-Vitamin D (CITRACAL + D OR) Take 1 tablet by mouth daily.      famotidine 20 MG tablet Take 1 tablet (20 mg) by mouth daily as needed for indigestion/heartburn.      letrozole 2.5 MG tablet Take 1 tablet (2.5 mg) by mouth daily. 90 tablet 3    metoprolol succinate ER 25 MG 24 hr tablet Take 1 tablet (25 mg) by mouth daily. Do not chew or crush. 90 tablet 3    valsartan 40 MG tablet Take 1 tablet (40 mg) by mouth 2 times a day. 180 tablet 3    venlafaxine ER 75 MG 24 hr capsule Take 1 capsule (75 mg) by mouth every morning. 90 capsule 0     No current facility-administered medications for this visit.       Review of Systems:  A complete review of systems was otherwise negative except as noted above.   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      Physical Exam:  Wt Readings from Last 3 Encounters:   10/06/20 69.2 kg (152 lb 7.2 oz)   07/29/20 67.6 kg (149 lb)   07/09/20 67.9 kg (149 lb 12.8 oz)     Telmed exam:  JVP at the clavicle    Laboratory Results:     Labs from 05/07/2020:  Na 138 K 4.2 Cl 104 CO2 30 BUN 15 Cr 0.76  WBC 2.58 Hct 34 Plt 178    Results for orders placed or performed in visit on 10/22/20   Prothrombin Time   Result Value Ref Range    Prothrombin Time Patient 12.1 10.7 - 15.6 s    Prothrombin INR 0.9 0.8 - 1.3   Partial Thromboplastin Time   Result Value Ref Range    Partial Thromboplastin Time 29 22 - 35 s    Partial Thromboplastin X Mean       To calculate the PTT X Mean divide PTT value by 29.   Platelet Count    Result Value Ref Range    Platelet Count 196 150 - 400 10*3/uL     Results for orders placed or performed in visit on 01/30/20   Lipid Panel   Result Value Ref Range    Cholesterol (Total) 165 <200 mg/dL    Triglyceride 106 <150 mg/dL    Cholesterol (HDL) 63 >39 mg/dL  Cholesterol (LDL) 81 <130 mg/dL    Non-HDL Cholesterol 102 0 - 159 mg/dL    Cholesterol/HDL Ratio 2.6     Lipid Panel, Additional Info. (NOTE)        Imaging:    EKG:01/11/2020:  Normal sinus rhythm, heart rate of 74 bpm, normal PR interval, premature ventricular contractions, LAE    cardiopulmonary exercise test on 05/30/2020:    Max VO2 15 ml/kg/min (66%) , RER 1.21, normal blood pressure response to exercise, normal heart rate response, O2 pulse of 82%, 30% MVV    Echocardiogram from 07/29/2020:  Normal LV size and functoin EF of 61% with GLS of -19%, normal RV size and function, minimal MR..  EF has improved from 12/2019.    Holter monitor 07/29/2020:  Predominant rhythm: NSR   Atrial Tachycardia (AT) 13 episodes, Longest 5 beats @ Avg  83 bpm up to 103 bpm, Fastest 4 beats @ Avg 143 bpm up to  177 bpm   Accelerated Idioventricular Rhythm (AIVR)   PAC 0.03 %   PVC 0.11 %    GDMT:  ACEI/ARB/ARNI: valsartan 40mg  BID  Beta-blockers: toprol XL 12.5mg  daily  MRAs:  SGLT2 inhibitor:  Diuretics:  Statin:  Digoxin:     Assessment:   50 year old female with probable herceptin-induced cardiomyopathy with delayed recovery complicated by PVC needing ablation, wide-complex tachycardia, history of high-grade infiltrating ductal carcinoma of the left breast (ER+/PR+, FISH +) with angiolymphatic invasion, requiring neoadjuvant chemotherapy with ddAC-THP, left mastectomy with lymph node biopsy. Herceptin and Perjeta was stopped after 4 cycles now with normalization of cardiac function with improved functional capacity.  Recent genetic testing did not demonstrate know genetic cause for cardiomyopathy, but she is a carrier for carnitine deficiency.      She is  doing better with improved functional capacity.  Not holding onto extra fluid.  She was encouraged to continue with current level of activity, but continue to modify stressors.      No changes to medications.  .     Plan:  1.  Continue with current medication as noted above  2.  Encourage ongoing exercise to monitor for any changes.  3.  She will call with any change in palpitations  4.  Minimize stressors    Follow-up:  Follow-up in 4 months.  Patient will call sooner if symptoms change.    Total time of >25 minutes was spent face-to-face with the patient, of which more than 50% was spent counseling and coordinating care.    Juanetta Beets, MD  Division of Cardiology  Advanced Heart Failure/Cardiac Transplantation  10/28/2020

## 2020-10-29 ENCOUNTER — Other Ambulatory Visit (HOSPITAL_BASED_OUTPATIENT_CLINIC_OR_DEPARTMENT_OTHER): Payer: Self-pay | Admitting: Physician Assistant

## 2020-10-29 DIAGNOSIS — Z170 Estrogen receptor positive status [ER+]: Secondary | ICD-10-CM

## 2020-10-29 DIAGNOSIS — C50412 Malignant neoplasm of upper-outer quadrant of left female breast: Secondary | ICD-10-CM

## 2020-10-30 MED ORDER — LETROZOLE 2.5 MG OR TABS
2.5000 mg | ORAL_TABLET | Freq: Every day | ORAL | 3 refills | Status: DC
Start: 2020-10-30 — End: 2021-10-29

## 2020-11-06 ENCOUNTER — Encounter (HOSPITAL_BASED_OUTPATIENT_CLINIC_OR_DEPARTMENT_OTHER): Payer: Self-pay

## 2020-11-09 NOTE — Addendum Note (Signed)
Encounter addended by: Deedra Ehrich on: 11/09/2020 9:52 AM   Actions taken: Visit diagnoses modified, Charge Capture section accepted

## 2020-11-24 ENCOUNTER — Other Ambulatory Visit (HOSPITAL_BASED_OUTPATIENT_CLINIC_OR_DEPARTMENT_OTHER): Payer: Self-pay

## 2020-11-24 ENCOUNTER — Telehealth (HOSPITAL_BASED_OUTPATIENT_CLINIC_OR_DEPARTMENT_OTHER): Payer: Self-pay | Admitting: Nursing

## 2020-11-24 ENCOUNTER — Other Ambulatory Visit (HOSPITAL_BASED_OUTPATIENT_CLINIC_OR_DEPARTMENT_OTHER): Payer: Self-pay | Admitting: Medical Oncology

## 2020-11-24 ENCOUNTER — Ambulatory Visit (HOSPITAL_BASED_OUTPATIENT_CLINIC_OR_DEPARTMENT_OTHER): Payer: Commercial Managed Care - PPO

## 2020-11-24 ENCOUNTER — Ambulatory Visit: Payer: Commercial Managed Care - PPO | Attending: Medical Oncology

## 2020-11-24 VITALS — BP 108/76 | HR 81 | Temp 98.1°F | Resp 16

## 2020-11-24 DIAGNOSIS — C50412 Malignant neoplasm of upper-outer quadrant of left female breast: Secondary | ICD-10-CM | POA: Insufficient documentation

## 2020-11-24 DIAGNOSIS — C773 Secondary and unspecified malignant neoplasm of axilla and upper limb lymph nodes: Secondary | ICD-10-CM | POA: Insufficient documentation

## 2020-11-24 DIAGNOSIS — T827XXD Infection and inflammatory reaction due to other cardiac and vascular devices, implants and grafts, subsequent encounter: Secondary | ICD-10-CM | POA: Insufficient documentation

## 2020-11-24 DIAGNOSIS — Z170 Estrogen receptor positive status [ER+]: Secondary | ICD-10-CM

## 2020-11-24 DIAGNOSIS — L089 Local infection of the skin and subcutaneous tissue, unspecified: Secondary | ICD-10-CM

## 2020-11-24 LAB — CBC, DIFF
% Basophils: 1 %
% Eosinophils: 2 %
% Immature Granulocytes: 0 %
% Lymphocytes: 29 %
% Monocytes: 7 %
% Neutrophils: 63 %
Absolute Eosinophil Count: 0.06 10*3/uL (ref 0.00–0.50)
Absolute Lymphocyte Count: 1.12 10*3/uL (ref 1.00–4.80)
Basophils: 0.02 10*3/uL (ref 0.00–0.20)
Hematocrit: 34 % — ABNORMAL LOW (ref 36.0–45.0)
Hemoglobin: 11.7 g/dL (ref 11.5–15.5)
Immature Granulocytes: 0.01 10*3/uL (ref 0.00–0.05)
MCH: 30.2 pg (ref 27.3–33.6)
MCHC: 34.4 g/dL (ref 32.2–36.5)
MCV: 88 fL (ref 81–98)
Monocytes: 0.26 10*3/uL (ref 0.00–0.80)
Neutrophils: 2.45 10*3/uL (ref 1.80–7.00)
Platelet Count: 193 10*3/uL (ref 150–400)
RBC: 3.87 10*6/uL (ref 3.80–5.00)
RDW-CV: 12.5 % (ref 11.6–14.4)
WBC: 3.92 10*3/uL — ABNORMAL LOW (ref 4.3–10.0)

## 2020-11-24 LAB — RENAL FUNCTION PANEL
Albumin: 4.4 g/dL (ref 3.5–5.2)
Anion Gap: 4 (ref 4–12)
Calcium: 9.6 mg/dL (ref 8.9–10.2)
Carbon Dioxide, Total: 32 meq/L (ref 22–32)
Chloride: 103 meq/L (ref 98–108)
Creatinine: 0.85 mg/dL (ref 0.38–1.02)
Glucose: 83 mg/dL (ref 62–125)
Phosphate: 3.5 mg/dL (ref 2.5–4.5)
Potassium: 3.6 meq/L (ref 3.6–5.2)
Sodium: 139 meq/L (ref 135–145)
Urea Nitrogen: 16 mg/dL (ref 8–21)
eGFR by CKD-EPI: >60 mL/min/{1.73_m2} (ref >59)

## 2020-11-24 MED ORDER — SULFAMETHOXAZOLE-TRIMETHOPRIM 800-160 MG OR TABS
1.0000 | ORAL_TABLET | Freq: Two times a day (BID) | ORAL | 0 refills | Status: AC
Start: 2020-11-24 — End: 2020-12-04
  Filled 2020-11-24: qty 20, 10d supply, fill #0

## 2020-11-24 NOTE — Progress Notes (Signed)
[facility] [name] [facility]  Patient Care Team:  Kittie Plater, MD as Oncologist (Medical Oncology)      IDENTIFICATION/CC  Yolanda Bowers is a 50 year old female with a diagnosis of breast cancer who presents today for evaluation of previous port site, which produced purulent drainage 2 days ago.        CURRENT TREATMENT  [No treatment plan]   Oncology History Overview Note   10/2018: [name]   11/24/2018: Bilateral mammogram revealed a negative right breast. To the left breast UOQ, there was an 31m mass and a 2.4 cm mass laterally, 17 cm from the nipple. Targeted left breast ultrasound revealed at 3 oclock 8 cm from the nipple, a 21x13x229mmass and an enlarged axillary lymph node measuring 12x11x1533mThere were multiple enlarged lymph nodes  11/27/2018: US-guided biopsy of the breast mass revealed invasive ductal carcinoma, ER/PR+, HER2+, grade 3. There was angiolymphatic invasion seen on biopsy. Biopsy of axillary lymph node positive for carcinoma.   12/08/2018: Bilateral breast MRI revealed right breast at 3 oclock, 2.7cm from the nipple, a 10 x 6 x8mm54mss and at 12 oclock, 11cm from the nipple, a spiculated 16 x 4 x 13mm40ms. Ultrasound recommended.  To the left breast, mass consistent with biopsy proved carcinoma measuring 24 x 20 x 24mm.3mre was also diffuse NME involving mid-outer breast extending over 110x45x69mm. 82min the NME, there was a small spiculated mass at 12 oclock measuring 12mm. M57mple level 1,2 and 3 lymph nodes seen, in addition to at least 3 internal mammary nodes.   12/14/2018: PET CT without evidence of distant metastasis. There are 4mm lung89mdules bilaterally.   12/29/2018: Started on neoadjuvant chemotherapy with ddAC x4 cycles.  03/09/2019: Started on neoadjuvant chemotherapy with Taxol, Herceptin, and Perjeta.  06/25/2019: Underwent left mastectomy and SLN biopsy with Dr. David ByrImagene Shellerogy revealed a single focus of angiolymphatic involvement by carcinoma (< 0.1cm). Negative surgical margins. 0 of 5 lymph nodes positive for carcinoma. ypT0 ypN0.   09/21/2019: Completed XRT.  Started on letrozole 2.'5mg'$  PO daily.     01/29/2020: Given drop in LVEF, last Herceptin/Perjeta infusion was discontinued. Letrozole continued.    Negative genetic panel testing, 11/2018.     Malignant neoplasm of upper-outer quadrant of left breast in female, estrogen receptor positive (HCC)   3/[place]2021 - 01/29/2020 Systemic Therapy    pertuzumab (Perjeta) 840 mg in sodium chloride 0.9 % 250 mL IVPB, Intravenous, 4 of 6 cycles  trastuzumab (Herceptin) 593 mg in sodium chloride 0.9 % 250 mL IVPB, Intravenous, 4 of 6 cycles     11/18/2019 Initial Diagnosis    Malignant neoplasm of upper-outer quadrant of left breast in female, estrogen receptor positive (HCC)     [place]    TREATMENT HISTORY  Oncology History Overview   10/2018: [name]   11/24/2018: Bilateral mammogram revealed a negative right breast. To the left breast UOQ, there was an 8mm mass 11m a 2.4 cm mass laterally, 17 cm from the nipple. Targeted left breast ultrasound revealed at 3 oclock 8 cm from the nipple, a 21x13x22mm mass 10man enlarged axillary lymph node measuring 12x11x15mm. There43me multiple enlarged lymph nodes  11/27/2018: US-guided biopsy of the breast mass revealed invasive ductal carcinoma, ER/PR+, HER2+, grade 3. There was angiolymphatic invasion seen on biopsy. Biopsy of axillary  lymph node positive for carcinoma.   12/08/2018: Bilateral breast MRI revealed right breast at 3 oclock, 2.7cm from the nipple, a 10 x 6 x16m mass and at 12 oclock, 11cm from the nipple, a spiculated 16 x 4 x 156mmass. Ultrasound recommended.  To the left breast, mass consistent with biopsy proved carcinoma measuring 24 x 20 x 2464mThere was also diffuse NME involving mid-outer breast extending over 110x45x69m49mWithin the NME, there was a small spiculated mass at 12 oclock measuring 12mm88mltiple level 1,2 and 3 lymph nodes seen, in addition to at least 3 internal mammary nodes.   12/14/2018: PET CT without evidence of distant metastasis. There are 4mm l37m nodules bilaterally.   12/29/2018: Started on neoadjuvant chemotherapy with ddAC x4 cycles.  03/09/2019: Started on neoadjuvant chemotherapy with Taxol, Herceptin, and Perjeta.  06/25/2019: Underwent left mastectomy and SLN biopsy with Dr. David Imagene Shellerology revealed a single focus of angiolymphatic involvement by carcinoma (< 0.1cm). Negative surgical margins. 0 of 5 lymph nodes positive for carcinoma. ypT0 ypN0.   09/21/2019: Completed XRT.  Started on letrozole 2.'5mg'$  PO daily.     01/29/2020: Given drop in LVEF, last Herceptin/Perjeta infusion was discontinued. Letrozole continued.    Negative genetic panel testing, 11/2018.          INTERVAL HISTORY   The patient reports that she had her port removed about 1 month ago.  She felt it had been healing well, until about 3 days ago.  She notes subjective fever, headache and night-sweats the night prior to waking up and realizing her port site was swollen, red, warm and painful.  The patient reports that she squeezed it and was able to produce approximately 1 tablespoon of purulent drainage that day.  The site felt improved but was still swollen, red and painful.  She continued to experience general malaise and again had some night sweats the next evening.  She then was able to produce about 1 tablespoon more of purulent drainage via squeezing the site, 2 days ago.  Since that time, her generalized symptoms of malaise, subjective fever and night sweats, have all resolved. 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      REVIEW OF SYSTEMS  Review of Systems   Eating, drinking, ambulating, and voiding adequately. Specifically  denies headaches, fevers, chills, breathing difficulties, URI symptoms, chest pain, nausea, vomiting, constipation, diarrhea, dysuria, skin changes, joint/muscle pain.    Except for those mentioned in the HPI, complete ROS is negative.    PROBLEM LIST  Active Ambulatory Problems     Diagnosis Date Noted    Macromastia 08/31/2019    Malignant neoplasm of upper-outer quadrant of left breast in female, estrogen receptor positive (HCC) 0[place]8/2021    CIN I (cervical intraepithelial neoplasia I) 06/01/2018    Chemotherapy induced cardiomyopathy (HCC) 0[place]9/2021    PVC's (premature ventricular contractions) 02/18/2020    Anxiety 10/28/2020     Resolved Ambulatory Problems     Diagnosis Date Noted    Ventricular tachyarrhythmia (HCC) 0[employer]7/2021     Past Medical History:   Diagnosis Date    Allergic rhinitis due to allergen     Anemia     Breast cancer (HCC)  [place]     [facility]    Congestive heart failure (HCC)[place]  Headache           HOME MEDICATIONS    Current Outpatient Medications:     acetaminophen 500 MG tablet, Take 1-2 tabs by mouth once daily as needed for pain., Disp: , Rfl:     aspirin 81 MG EC tablet, Take 81 mg by mouth., Disp: , Rfl:     Calcium Citrate-Vitamin D (CITRACAL + D OR), Take 1 tablet by mouth daily., Disp: , Rfl:     famotidine 20 MG tablet, Take 1 tablet (20 mg) by mouth daily as needed for indigestion/heartburn., Disp: , Rfl:     letrozole 2.5 MG tablet, Take 1 tablet (2.5 mg) by mouth daily., Disp: 90 tablet, Rfl: 3    metoprolol succinate ER 25 MG 24 hr tablet, Take 1 tablet (25 mg) by mouth daily. Do not chew or crush., Disp: 90 tablet, Rfl: 3    sulfamethoxazole-trimethoprim 800-160 MG tablet, Take 1 tablet by mouth 2 times a day for 10 days., Disp: 20 tablet, Rfl: 0    valsartan 40 MG tablet, Take 1 tablet (40 mg) by mouth 2 times a day., Disp: 180 tablet, Rfl: 3    venlafaxine ER 75 MG 24 hr capsule, Take 1 capsule (75  mg) by mouth every morning., Disp: 90 capsule, Rfl: 0     ALLERGIES  Review of patient's allergies indicates:  Allergies   Allergen Reactions    Oxycodone Skin: Itching    Codeine IO:[id number]/[id number]    Lisinopril Resp: Cough    Walnut Skin: Rash    Paclitaxel Skin: Hives          PHYSICAL EXAMINATION  VITAL SIGNS: BP 108/76 (Patient Position: Sitting)    Pulse 81    Temp 36.7 C (Oral)    Resp 16    SpO2 97%   GENERAL: pleasant, alert and oriented, in no acute distress. Appears stated age.   SKIN: No rashes or significant skin lesions on exposed skin. Previous R chest port site with healed horizontal scar, mild erythema surrounding the area, palpable lump just cephalad of the right side of port site without fluctuance, induration or pain to palpation.   PSYCHIATRIC: Appropriate mood and affect for situation.    LABORATORY STUDIES  Lab on 11/24/2020   Component Date Value Ref Range Status    WBC 11/24/2020 3.92* 4.3 - 10.0 10*3/uL Final    RBC 11/24/2020 3.87  3.80 - 5.00 10*6/uL Final    Hemoglobin 11/24/2020 11.7  11.5 - 15.5 g/dL Final    Hematocrit 11/24/2020 34* 36.0 - 45.0 % Final    MCV 11/24/2020 88  81 - 98 fL Final    MCH 11/24/2020 30.2  27.3 - 33.6 pg Final    MCHC 11/24/2020 34.4  32.2 - 36.5 g/dL Final    Platelet Count 11/24/2020 193  150 - 400 10*3/uL Final    RDW-CV 11/24/2020 12.5  11.6 - 14.4 % Final    % Neutrophils 11/24/2020 63  % Final    % Lymphocytes 11/24/2020 29  % Final    % Monocytes 11/24/2020 7  % Final    % Eosinophils 11/24/2020 2  % Final    % Basophils 11/24/2020 1  % Final    % Immature Granulocytes 11/24/2020 0  % Final    Neutrophils 11/24/2020 2.45  1.80 - 7.00 10*3/uL Final    Absolute Lymphocyte Count 11/24/2020 1.12  1.00 - 4.80 10*3/uL Final    Monocytes 11/24/2020 0.26  0.00 - 0.80 10*3/uL Final  Absolute Eosinophil Count 11/24/2020 0.06  0.00 - 0.50 10*3/uL Final    Basophils 11/24/2020 0.02  0.00 - 0.20 10*3/uL Final    Immature Granulocytes  11/24/2020 0.01  0.00 - 0.05 10*3/uL Final    RBC Morphology 11/24/2020 See RBC data   Final    Platelet Morphology 11/24/2020 See PLT count   Final    WBC Morphology 11/24/2020 See Diff   Final    Sodium 11/24/2020 139  135 - 145 meq/L Final    Potassium 11/24/2020 3.6  3.6 - 5.2 meq/L Final    Chloride 11/24/2020 103  98 - 108 meq/L Final    Carbon Dioxide, Total 11/24/2020 32  22 - 32 meq/L Final    Anion Gap 11/24/2020 4  4 - 12 Final    Glucose 11/24/2020 83  62 - 125 mg/dL Final    Urea Nitrogen 11/24/2020 16  8 - 21 mg/dL Final    Creatinine 11/24/2020 0.85  0.38 - 1.02 mg/dL Final    Albumin 11/24/2020 4.4  3.5 - 5.2 g/dL Final    Calcium 11/24/2020 9.6  8.9 - 10.2 mg/dL Final    Phosphate 11/24/2020 3.5  2.5 - 4.5 mg/dL Final    eGFR by CKD-EPI 11/24/2020 >60  >59 mL/min/[1.73_m2] Final          IMAGING/PERTINENT STUDIES  No orders to display      Discontinue Implanted Port  Narrative: PROCEDURE: Right chest port removal    PRE-PROCEDURE DIAGNOSIS:   Breast cancer. Intravenous therapy is completed and port removal is requested.     POST-PROCEDURE DIAGNOSIS:   Same, s/p port removal.    OPERATORS:   Amada Kingfisher, MD (Attending)     CONSENT:   The risks, benefits, and alternatives of the planned procedure were explained in detail to the patient. Questions were answered and written informed consent was obtained.    TIME OUT:   A time out and final verification was done by the entire team to verify patient, procedure, and site.    STERILE PREPARATION:   All elements of maximal sterile barrier technique were followed, including use of cap, mask, sterile gown, gloves, and sheet, chlorhexidine 2% /  isopropyl alcohol or povidone-iodine, and appropriate hand hygiene.     SEDATION:   Incremental doses of intravenous midazolam and fentanyl were utilized for conscious sedation administered by the IR nurse Morey Hummingbird, RN) under direct supervision of the IR attending over a period of 15 minutes. Continuous  hemodynamic and ventilatory monitoring was performed throughout and following the procedure.    TECHNIQUE:  Sterile preparation of the right side of the chest and neck was performed. The pre-existing port incision was anesthetized with 1% lidocaine.  An incision was made through previous incision. Using blunt and sharp dissection, the port was freed from the surrounding tissues. The port and the catheter were removed intact and in their entirety. The port pocket demonstrated no signs of infection. The port pocket was then irrigated and hemostasis was confirmed. The incision was then closed with two layers of absorbable suture and skin adhesive. A sterile dry dressing was applied. The patient tolerated the procedure well without complication.     I, Dr. Caro Hight, attending interventional radiologist, was present for the entire procedure.     MEDICATIONS:  Midazolam 2 mg IV  Fentanyl 100 mcg IV  Lidocaine 1% subcutaneous    ESTIMATED BLOOD LOSS: None  SPECIMENS OBTAINED: None.  COMPLICATIONS: None.  Impression: Uncomplicated removal of a right  chest PORT.               ASSESSMENT/PLAN  Maclovia Jolan Mealor is a 50 year old female with a diagnosis of breast cancer who presents for evaluation of possible infected previous port site (R Chest).     #R Port site (removed 1 month ago) infection  As stated above, the patient was able to produce drainage from what sounds like an abscess that formed as her port site healed.  On presentation today, the site does not look concerning for active/progressing cellulitis. There is no fluctuance to indicate further drainage/I&D.  The patient's labs revealed a normal white count, also reassuring.  Her generalized infectious symptoms have also resolved.  Current data shows much better curative rate when I&D is coupled with PO antibiotic therapy.  Subsequently, I sent Rx for Bactrim DS 1 tab BID x10 days to the pharmacy.       TIME STATEMENT  I spent 15 minutes on the care of  this patient including assessment, diagnosis, management of skin infection , and care coordination with the patient's primary oncology team.        Park Liter, PA-C  [facility]  [phone] pager

## 2020-11-24 NOTE — Telephone Encounter (Signed)
Oncology Telephone Triage Note      Subjective / Visit Information:    Reason for Call: No chief complaint on file.           Assessment and Plan:     Call Summary: Pt calls to discuss port removal site. She states that she had her port removed about a month ago and noticed on Friday 4/1 that her port looked infected. She states that the site was swollen, red and filled with pus. She was also having a fever and chills at the time. She reports that she "squeezed it" and cleaned it with alcohol. She did not call at this time. She calls today to discuss this and report that the area "looks way better" but is still red, swollen and a little sore to the touch. RN requested pt send a picture of the area and will consult the team.    [facility] contacted, they have availability to see pt today. RN called pt back 2x, 0 answer, LVM x2 requesting pt come to [facility] ASAP for labs and evaluation and possible IV ABX. RN also called pt's husband to help get in contact with Yolanda Bowers. Yolanda Bowers called back, will head to [facility], no other needs at this time.    Action Taken: Provider Notified         Response: Aware of existing appointments, Aware of clinic contact information, States understanding of plan and No further questions      Additional documentation may exist in Flowsheets or Education Activity

## 2020-11-27 ENCOUNTER — Encounter (HOSPITAL_BASED_OUTPATIENT_CLINIC_OR_DEPARTMENT_OTHER): Payer: Self-pay | Admitting: Medical Oncology

## 2020-12-29 ENCOUNTER — Ambulatory Visit: Payer: Commercial Managed Care - PPO | Attending: Physician Assistant | Admitting: Physician Assistant

## 2020-12-29 ENCOUNTER — Ambulatory Visit (HOSPITAL_BASED_OUTPATIENT_CLINIC_OR_DEPARTMENT_OTHER): Payer: Commercial Managed Care - PPO

## 2020-12-29 DIAGNOSIS — C50412 Malignant neoplasm of upper-outer quadrant of left female breast: Secondary | ICD-10-CM | POA: Insufficient documentation

## 2020-12-29 DIAGNOSIS — Z170 Estrogen receptor positive status [ER+]: Secondary | ICD-10-CM | POA: Insufficient documentation

## 2020-12-29 DIAGNOSIS — Z8041 Family history of malignant neoplasm of ovary: Secondary | ICD-10-CM | POA: Insufficient documentation

## 2020-12-29 DIAGNOSIS — Z808 Family history of malignant neoplasm of other organs or systems: Secondary | ICD-10-CM | POA: Insufficient documentation

## 2020-12-29 DIAGNOSIS — Z79811 Long term (current) use of aromatase inhibitors: Secondary | ICD-10-CM | POA: Insufficient documentation

## 2020-12-29 LAB — CBC, DIFF
% Basophils: 1 %
% Eosinophils: 2 %
% Immature Granulocytes: 0 %
% Lymphocytes: 31 %
% Monocytes: 8 %
% Neutrophils: 59 %
Absolute Eosinophil Count: 0.05 10*3/uL (ref 0.00–0.50)
Absolute Lymphocyte Count: 0.9 10*3/uL — ABNORMAL LOW (ref 1.00–4.80)
Basophils: 0.02 10*3/uL (ref 0.00–0.20)
Hematocrit: 35 % — ABNORMAL LOW (ref 36.0–45.0)
Hemoglobin: 11.9 g/dL (ref 11.5–15.5)
Immature Granulocytes: 0 10*3/uL (ref 0.00–0.05)
MCH: 30.2 pg (ref 27.3–33.6)
MCHC: 34.3 g/dL (ref 32.2–36.5)
MCV: 88 fL (ref 81–98)
Monocytes: 0.22 10*3/uL (ref 0.00–0.80)
Neutrophils: 1.7 10*3/uL — ABNORMAL LOW (ref 1.80–7.00)
Platelet Count: 183 10*3/uL (ref 150–400)
RBC: 3.94 10*6/uL (ref 3.80–5.00)
RDW-CV: 12.6 % (ref 11.6–14.4)
WBC: 2.85 10*3/uL — ABNORMAL LOW (ref 4.3–10.0)

## 2020-12-29 LAB — COMPREHENSIVE METABOLIC PANEL
ALT (GPT): 19 U/L (ref 7–33)
AST (GOT): 22 U/L (ref 9–38)
Albumin: 4.4 g/dL (ref 3.5–5.2)
Alkaline Phosphatase (Total): 41 U/L (ref 34–121)
Anion Gap: 7 (ref 4–12)
Bilirubin (Total): 0.4 mg/dL (ref 0.2–1.3)
Calcium: 9.4 mg/dL (ref 8.9–10.2)
Carbon Dioxide, Total: 30 meq/L (ref 22–32)
Chloride: 103 meq/L (ref 98–108)
Creatinine: 0.85 mg/dL (ref 0.38–1.02)
Glucose: 70 mg/dL (ref 62–125)
Potassium: 3.5 meq/L — ABNORMAL LOW (ref 3.6–5.2)
Protein (Total): 6.9 g/dL (ref 6.0–8.2)
Sodium: 140 meq/L (ref 135–145)
Urea Nitrogen: 18 mg/dL (ref 8–21)
eGFR by CKD-EPI: >60 mL/min/{1.73_m2} (ref >59)

## 2020-12-29 LAB — ESTRADIOL: Estradiol: <20 pg/mL

## 2020-12-29 MED ORDER — GABAPENTIN 100 MG OR CAPS
ORAL_CAPSULE | ORAL | 1 refills | Status: DC
Start: 2020-12-29 — End: 2021-03-03

## 2020-12-29 NOTE — Progress Notes (Signed)
[facility]  BREAST ONCOLOGY CLINIC NOTE    IDENTIFICATION  Yolanda Bowers is a 50 year old female with history of cT3N3 left breast invasive ductal carcinoma, ER/PR/HER2+, s/p neoadjuvant chemotherapy with ddAC-THP, left mastectomy and SLN biopsy, and adjuvant radiation therapy. She had a complete pathologic response at the time of surgery. Presents to clinic today for labs and follow up while on letrozole.     ONCOLOGIC HISTORY     Oncology History Overview Note   10/2018: [name]   11/24/2018: Bilateral mammogram revealed a negative right breast. To the left breast UOQ, there was an 43m mass and a 2.4 cm mass laterally, 17 cm from the nipple. Targeted left breast ultrasound revealed at 3 o'clock 8 cm from the nipple, a 21x13x233mmass and an enlarged axillary lymph node measuring 12x11x1557mThere were multiple enlarged lymph nodes  11/27/2018: US-guided biopsy of the breast mass revealed invasive ductal carcinoma, ER/PR+, HER2+, grade 3. There was angiolymphatic invasion seen on biopsy. Biopsy of axillary lymph node positive for carcinoma.   12/08/2018: Bilateral breast MRI revealed right breast at 3 o'clock, 2.7cm from the nipple, a 10 x 6 x8mm4mss and at 12 o'clock, 11cm from the nipple, a spiculated 16 x 4 x 13mm95ms. Ultrasound recommended.  To the left breast, mass consistent with biopsy proved carcinoma measuring 24 x 20 x 24mm.5mre was also diffuse NME involving mid-outer breast extending over 110x45x69mm. 65min the NME, there was a small spiculated mass at 12 o'clock measuring 12mm. M52mple level 1,2 and 3 lymph nodes seen, in addition to at least 3 internal mammary nodes.   12/14/2018: PET CT without evidence of distant metastasis. There are 4mm lung65mdules bilaterally.   12/29/2018: Started on neoadjuvant chemotherapy with ddAC x4 cycles.  03/09/2019: Started on neoadjuvant chemotherapy with Taxol, Herceptin, and  Perjeta.  06/25/2019: Underwent left mastectomy and SLN biopsy with Dr. David ByrImagene Shellergy revealed a single focus of angiolymphatic involvement by carcinoma (< 0.1cm). Negative surgical margins. 0 of 5 lymph nodes positive for carcinoma. ypT0 ypN0.   09/21/2019: Completed XRT.  Started on letrozole 2.5mg PO da15m.     01/29/2020: Given drop in LVEF, last Herceptin/Perjeta infusion was discontinued. Letrozole continued.    Negative genetic panel testing, 11/2018.     Malignant neoplasm of upper-outer quadrant of left breast in female, estrogen receptor positive (HCC)   3/1[place]021 - 01/29/2020 Systemic Therapy    pertuzumab (Perjeta) 840 mg in sodium chloride 0.9 % 250 mL IVPB, Intravenous, 4 of 6 cycles  trastuzumab (Herceptin) 593 mg in sodium chloride 0.9 % 250 mL IVPB, Intravenous, 4 of 6 cycles     11/18/2019 Initial Diagnosis    Malignant neoplasm of upper-outer quadrant of left breast in female, estrogen receptor positive (HCC)      [place] INTERVAL HISTORY  [name] was last seen by Yolanda Bowers on Yolanda Bowers. In the interim, she had her port removed and saw the [facility] on 4/4 for drainage at the port removal site. This has since improved. She does note that it remains tender to palpation but she doesn't need to take any medication for this. She thinks she can feel some scar tissue to the area as well.   She continues to note shoulder and hip aches. She had been seeing PT for her right shoulder pain. She is working with [employer] classes 3 days a  week: aerobics, strengthening, and dance/yoga. Sometimes, however, after a long work day or in general, she doesn't have the energy level to complete this. She continues to struggle with fatigue. She endorses significant stress at work. She endorses fatigue at the end of the day and feels that she is "roasting". She endorses night sweats as well. She eats well, eating salads and incorporating protein in her diet. She does not that when she was in [place],  fatigue and her symptoms did improve.  She continues to endorse shortness of breath with exertion; this improves with rest. She endorses occasional heart palpitations that is unchanged. She does not that when she was in [place], fatigue and her symptoms did improve.  She continues to note left breast/arm lymphedema and tingling. This is unchanged. She continues on letrozole.     ECOG Status: (0) Fully active, able to carry on all predisease performance without restriction     REVIEW OF SYSTEMS  A complete review of systems was conducted and is negative in detail, except as noted in the interval history.    PAST MEDICAL HISTORY  Patient Active Problem List   Diagnosis   . Macromastia   . Malignant neoplasm of upper-outer quadrant of left breast in female, estrogen receptor positive (HCC)   . CIN I (cervical intraepithelial neoplasia I)   . Chemotherapy induced cardiomyopathy (HCC)   . PVC's (premature ventricular contractions)   . Anxiety        PAST SURGICAL HISTORY  Past Surgical History:   Procedure Laterality Date   . BREAST BIOPSY Right 11/2018    2 core bx     . BREAST RECONSTRUCTION Right 06/25/2019    reduction - Dr. Inchauste   . PR MASTECTOMY SIMPLE COMPLETE Left 06/25/2019    Had radiation        ALLERGIES  Review of patient's allergies indicates:  Allergies   Allergen Reactions   . Oxycodone Skin: Itching   . Codeine GI:Nausea/vomiting   . Lisinopril Resp: Cough   . Walnut Skin: Rash   . Paclitaxel Skin: Hives        MEDICATIONS    Current Outpatient Medications:   .  acetaminophen 500 MG tablet, Take 1-2 tabs by mouth once daily as needed for pain., Disp: , Rfl:   .  aspirin 81 MG EC tablet, Take 81 mg by mouth., Disp: , Rfl:   .  Calcium Citrate-Vitamin D (CITRACAL + D OR), Take 1 tablet by mouth daily., Disp: , Rfl:   .  cetirizine 10 MG tablet, Take by mouth., Disp: , Rfl:   .  famotidine 20 MG tablet, Take 1 tablet (20 mg) by mouth daily as needed for indigestion/heartburn., Disp: , Rfl:   .   gabapentin 100 MG capsule, Take 100mg qHS for night sweats. Can increase to 200mg if no effect after 5-7 days. Can increase to 300mg as tolerated., Disp: 30 capsule, Rfl: 1  .  letrozole 2.5 MG tablet, Take 1 tablet (2.5 mg) by mouth daily., Disp: 90 tablet, Rfl: 3  .  metoprolol succinate ER 25 MG 24 hr tablet, Take 1 tablet (25 mg) by mouth daily. Do not chew or crush., Disp: 90 tablet, Rfl: 3  .  valsartan 40 MG tablet, Take 1 tablet (40 mg) by mouth 2 times a day., Disp: 180 tablet, Rfl: 3  .  venlafaxine ER 75 MG 24 hr capsule, Take 1 capsule (75 mg) by mouth every morning., Disp: 90 capsule, Rfl: 0       SOCIAL HISTORY:   Social History     Tobacco Use   . Smoking status: Never Smoker   . Smokeless tobacco: Never Used   Substance Use Topics   . Alcohol use: Not Currently   . Drug use: Not Currently        FAMILY HISTORY:    Cancer-related family history includes Ovarian Cancer (age of onset: 4) in her paternal aunt; Skin Cancer in her father and maternal grandfather.     PHYSICAL EXAMINATION   The patient was asked if they would like to have a chaperone in the room for this exam.   The patient declined a chaperone.     VITAL SIGNS: BP 104/69   Pulse 62   Temp 36.7 C (Oral)   Resp 16   Wt 72.9 kg (160 lb 11.5 oz)   SpO2 98%   BMI 25.94 kg/m       GENERAL: Well appearing, well developed female in no acute distress.  HEENT: Normocephalic, atraumatic. Sclerae anicteric. Pupils are equal, round.   NECK: Neck is supple without any appreciable lymphadenopathy or thyromegaly.  LUNGS: Respirations regular and unlabored. Clear to auscultation bilaterally.  CARDIAC: Regular rate and rhythm. No murmurs, gallops, or rubs.   BREASTS: Left breast s/p mastectomy with well healed surgical scars. Right breast s/p reduction. No palpable masses, tenderness, or lesions. Nipple everted without discharge. No appreciable axillary lymphadenopathy bilaterally.  ABDOMEN: Soft, non-tender, non-distended. Normoactive bowel sounds.  No hepatosplenomegaly or masses appreciated.  EXTREMITIES: No edema, clubbing, or cyanosis.  SKIN: No visible rash, bruising, or petechiae.  MSK: Without muscle or joint deformities.   NEUROLOGICAL: Alert and oriented. Mental status appropriate.   NUTRITIONAL STATUS: Adequate.    LABORATORY  Office Visit on 12/29/2020   Component Date Value   . Lab Test Requested 12/29/2020 differential (neutrophil, lymphocyte, etc)    . Specimen Type/Description 12/29/2020 Blood    . Sample To Use 12/29/2020 Most recent    . Test Request Status 12/29/2020 Order Processed    . WBC 12/29/2020 2.85 (A)   . RBC 12/29/2020 3.94    . Hemoglobin 12/29/2020 11.9    . Hematocrit 12/29/2020 35 (A)   . MCV 12/29/2020 88    . [facility] 12/29/2020 30.2    . MCHC 12/29/2020 34.3    . Platelet Count 12/29/2020 183    . RDW-CV 12/29/2020 12.6    . % Neutrophils 12/29/2020 59    . % Lymphocytes 12/29/2020 31    . % Monocytes 12/29/2020 8    . % Eosinophils 12/29/2020 2    . % Basophils 12/29/2020 1    . % Immature Granulocytes 12/29/2020 0    . Neutrophils 12/29/2020 1.70 (A)   . Absolute Lymphocyte Count 12/29/2020 0.90 (A)   . Monocytes 12/29/2020 0.22    . Absolute Eosinophil Count 12/29/2020 0.05    . Basophils 12/29/2020 0.02    . Immature Granulocytes 12/29/2020 0.00    . RBC Morphology 12/29/2020 See RBC data    . Platelet Morphology 12/29/2020 See PLT count    . WBC Morphology 12/29/2020 See Diff    Lab on 12/29/2020   Component Date Value   . Estradiol 12/29/2020 <20    . Sodium 12/29/2020 140    . Potassium 12/29/2020 3.5 (A)   . Chloride 12/29/2020 103    . Carbon Dioxide, Total 12/29/2020 30    . Anion Gap 12/29/2020 7    . Glucose 12/29/2020 70    .  Urea Nitrogen 12/29/2020 18    . Creatinine 12/29/2020 0.85    . Protein (Total) 12/29/2020 6.9    . Albumin 12/29/2020 4.4    . Bilirubin (Total) 12/29/2020 0.4    . Calcium 12/29/2020 9.4    . AST (GOT) 12/29/2020 22    . Alkaline Phosphatase (To* 12/29/2020 41    . ALT (GPT) 12/29/2020 19    .  eGFR by CKD-EPI 12/29/2020 >60          ASSESSMENT/PLAN  Yolanda Bowers is a 49 year old female with history of cT3N3 left breast invasive ductal carcinoma, ER/PR/HER2+, s/p neoadjuvant chemotherapy with ddAC-THP, left mastectomy and SLN biopsy, and adjuvant radiation therapy. She had a complete pathologic response at the time of surgery. Presents to clinic today for labs and follow up while on adjuvant letrozole.    1. Malignant neoplasm of upper-outer quadrant of left breast in female, estrogen receptor positive (HCC)  Genora is doing well overall today from a breast cancer perspective without evidence of recurrence based on review of systems and clinical exam. R breast mammogram due 06/2021. We discussed a referral to cancer rehab to discuss her fatigue, lymphedema, arthralgias, etc as it relates to her breast cancer treatment.   Labs today with low WBC. Differential added after the visit showed a low ANC and lymphocyte count. We will repeat this in 6 weeks, and then again in 12 weeks. Remainder of labs within normal limits.  Continue to work to build back stamina.  We discussed her night sweats and the potential impact on daytime fatigue. Recommended trial of gabapentin, which she is interested in.  - gabapentin 100 MG capsule; Take 100mg qHS for night sweats. Can increase to 200mg if no effect after 5-7 days. Can increase to 300mg as tolerated.  Dispense: 30 capsule; Refill: 1  - Referral to Cancer Rehab Medicine; Future  - CBC with Differential; Future  - Clinic Visit Nurse Results Review; Future  - CBC with Differential; Future  - Comprehensive Metabolic Panel; Future  - Clinic Visit Physician; Future    Breast Cancer:   Latorya states she is tolerating her letrozole well, and we'll continue, f/u estradiol to confirm she's persistently in menopause.  -Surveillance Imaging/Mammogram/MRI: right mammogram due 03/2021       Bone Health:  -Dexa: scan on 3/21 showing osteopenia, due again 10/2021     Psych:     -Patient working with counselor at [facility], continued to encourage reaching out if things worsen.     Survivorship:  - Counseled on regular exercise and dietary measures to reduce risk of recurrence.    DISPOSITION  Patient to return to clinic in 3-4 months for follow up and surveillance. Patient encouraged to contact clinic in the interim with any questions or concerns.    TIME STATEMENT: I spent 40 minutes in total for patient's care today, including preparation, time with patient, and charting.

## 2020-12-31 LAB — LAB ADD ON ORDER

## 2021-01-05 ENCOUNTER — Encounter (HOSPITAL_BASED_OUTPATIENT_CLINIC_OR_DEPARTMENT_OTHER): Payer: Self-pay | Admitting: Physician Assistant

## 2021-01-05 DIAGNOSIS — Z170 Estrogen receptor positive status [ER+]: Secondary | ICD-10-CM

## 2021-01-05 DIAGNOSIS — C50412 Malignant neoplasm of upper-outer quadrant of left female breast: Secondary | ICD-10-CM

## 2021-01-06 ENCOUNTER — Other Ambulatory Visit (HOSPITAL_BASED_OUTPATIENT_CLINIC_OR_DEPARTMENT_OTHER): Payer: Self-pay | Admitting: Nurse Practitioner

## 2021-01-20 ENCOUNTER — Encounter (HOSPITAL_BASED_OUTPATIENT_CLINIC_OR_DEPARTMENT_OTHER): Payer: Self-pay | Admitting: Nursing

## 2021-01-20 ENCOUNTER — Other Ambulatory Visit (HOSPITAL_BASED_OUTPATIENT_CLINIC_OR_DEPARTMENT_OTHER): Payer: Self-pay | Admitting: Nurse Practitioner

## 2021-01-20 ENCOUNTER — Ambulatory Visit: Payer: Commercial Managed Care - PPO | Attending: Physician Assistant

## 2021-01-20 DIAGNOSIS — Z170 Estrogen receptor positive status [ER+]: Secondary | ICD-10-CM | POA: Insufficient documentation

## 2021-01-20 DIAGNOSIS — C50412 Malignant neoplasm of upper-outer quadrant of left female breast: Secondary | ICD-10-CM | POA: Insufficient documentation

## 2021-01-20 LAB — CBC, DIFF
% Basophils: 1 %
% Eosinophils: 2 %
% Immature Granulocytes: 0 %
% Lymphocytes: 25 %
% Monocytes: 8 %
% Neutrophils: 63 %
Absolute Eosinophil Count: 0.07 10*3/uL (ref 0.00–0.50)
Absolute Lymphocyte Count: 0.74 10*3/uL — ABNORMAL LOW (ref 1.00–4.80)
Basophils: 0.02 10*3/uL (ref 0.00–0.20)
Hematocrit: 35 % — ABNORMAL LOW (ref 36.0–45.0)
Hemoglobin: 11.8 g/dL (ref 11.5–15.5)
Immature Granulocytes: 0 10*3/uL (ref 0.00–0.05)
MCH: 30.2 pg (ref 27.3–33.6)
MCHC: 33.5 g/dL (ref 32.2–36.5)
MCV: 90 fL (ref 81–98)
Monocytes: 0.24 10*3/uL (ref 0.00–0.80)
Neutrophils: 1.86 10*3/uL (ref 1.80–7.00)
Platelet Count: 174 10*3/uL (ref 150–400)
RBC: 3.91 10*6/uL (ref 3.80–5.00)
RDW-CV: 12.8 % (ref 11.6–14.4)
WBC: 2.93 10*3/uL — ABNORMAL LOW (ref 4.3–10.0)

## 2021-01-20 NOTE — Telephone Encounter (Signed)
-----   Message from [place] sent at 01/20/2021  1:52 PM PDT -----  Regarding: RE: Lab review  I think this looks fine and plans to recheck in June and August seem fine.  ----- Message -----  From: Barbette Or  Sent: 01/20/2021   1:41 PM PDT  To: Kittie Plater, MD, Denice Bors, ARNP, #  Subject: Lab review                                       Labs for [name] below, we have been watching her ANC/WBC, improved today. Looks like there are orders for this to be repeated on 6/20 and 8/9. Let us know if any changes in plan or anything to relay to pt.    Results for Yolanda, Bowers (MRN [id number]) as of 01/20/2021 13:36    01/20/2021 13:00  WBC: 2.93 (L)  RBC: 3.91  Hemoglobin: 11.8  Hematocrit: 35 (L)  MCV: 90  MCH: 30.2  MCHC: 33.5  Platelet Count: 174  RDW-CV: 12.8  % Neutrophils: 63  % Lymphocytes: 25  % Monocytes: 8  % Eosinophils: 2  % Basophils: 1  % Immature Granulocytes: 0  Neutrophils: 1.86  Absolute Lymphocyte Count: 0.74 (L)  Monocytes: 0.24  Absolute Eosinophil Count: 0.07  Basophils: 0.02  Immature Granulocytes: 0.00  WBC Morphology: See Diff  RBC Morphology: See RBC data  Platelet Morphology: See PLT count    1. Malignant neoplasm of upper-outer quadrant of left breast in female, estrogen receptor positive ([place])  Yolanda Bowers is doing well overall today from a breast cancer perspective without evidence of recurrence based on review of systems and clinical exam. R breast mammogram due 06/2021. We discussed a referral to cancer rehab to discuss her fatigue, lymphedema, arthralgias, etc as it relates to her breast cancer treatment.   Labs today with low WBC. Differential added after the visit showed a low ANC and lymphocyte count. We will repeat this in 6 weeks, and then again in 12 weeks. Remainder of labs within normal limits.  Continue to work to build back stamina.  We discussed her night sweats and the potential impact on daytime fatigue. Recommended trial of gabapentin, which she is  interested in.  - gabapentin 100 MG capsule; Take 100mg  qHS for night sweats. Can increase to 200mg  if no effect after 5-7 days. Can increase to 300mg  as tolerated.  Dispense: 30 capsule; Refill: 1  - Referral to [facility]; Future  - CBC with Differential; Future  - Clinic Visit Nurse Results Review; Future  - CBC with Differential; Future  - Comprehensive Metabolic Panel; Future  - Clinic Visit Physician; Future

## 2021-02-13 ENCOUNTER — Ambulatory Visit
Payer: Commercial Managed Care - PPO | Attending: Physician Assistant | Admitting: Student in an Organized Health Care Education/Training Program

## 2021-02-13 ENCOUNTER — Telehealth (HOSPITAL_BASED_OUTPATIENT_CLINIC_OR_DEPARTMENT_OTHER): Payer: Self-pay

## 2021-02-13 ENCOUNTER — Ambulatory Visit: Payer: Commercial Managed Care - PPO | Attending: Physician Assistant

## 2021-02-13 ENCOUNTER — Other Ambulatory Visit (HOSPITAL_BASED_OUTPATIENT_CLINIC_OR_DEPARTMENT_OTHER): Payer: Self-pay | Admitting: Nursing

## 2021-02-13 VITALS — BP 123/76 | HR 61 | Temp 98.6°F | Resp 16 | Wt 164.2 lb

## 2021-02-13 DIAGNOSIS — C50412 Malignant neoplasm of upper-outer quadrant of left female breast: Secondary | ICD-10-CM

## 2021-02-13 DIAGNOSIS — M2550 Pain in unspecified joint: Secondary | ICD-10-CM

## 2021-02-13 DIAGNOSIS — Z170 Estrogen receptor positive status [ER+]: Secondary | ICD-10-CM | POA: Insufficient documentation

## 2021-02-13 DIAGNOSIS — T451X5A Adverse effect of antineoplastic and immunosuppressive drugs, initial encounter: Secondary | ICD-10-CM

## 2021-02-13 DIAGNOSIS — Z853 Personal history of malignant neoplasm of breast: Secondary | ICD-10-CM

## 2021-02-13 DIAGNOSIS — R530 Neoplastic (malignant) related fatigue: Secondary | ICD-10-CM

## 2021-02-13 DIAGNOSIS — G5603 Carpal tunnel syndrome, bilateral upper limbs: Secondary | ICD-10-CM

## 2021-02-13 LAB — CBC, DIFF
% Basophils: 1 %
% Eosinophils: 3 %
% Immature Granulocytes: 0 %
% Lymphocytes: 31 %
% Monocytes: 10 %
% Neutrophils: 56 %
Absolute Eosinophil Count: 0.08 10*3/uL (ref 0.00–0.50)
Absolute Lymphocyte Count: 0.81 10*3/uL — ABNORMAL LOW (ref 1.00–4.80)
Basophils: 0.02 10*3/uL (ref 0.00–0.20)
Hematocrit: 35 % — ABNORMAL LOW (ref 36.0–45.0)
Hemoglobin: 12 g/dL (ref 11.5–15.5)
Immature Granulocytes: 0 10*3/uL (ref 0.00–0.05)
MCH: 29.8 pg (ref 27.3–33.6)
MCHC: 34.4 g/dL (ref 32.2–36.5)
MCV: 87 fL (ref 81–98)
Monocytes: 0.25 10*3/uL (ref 0.00–0.80)
Neutrophils: 1.46 10*3/uL — ABNORMAL LOW (ref 1.80–7.00)
Platelet Count: 173 10*3/uL (ref 150–400)
RBC: 4.03 10*6/uL (ref 3.80–5.00)
RDW-CV: 12.3 % (ref 11.6–14.4)
WBC: 2.62 10*3/uL — ABNORMAL LOW (ref 4.3–10.0)

## 2021-02-13 NOTE — Telephone Encounter (Addendum)
RN LDVM with ANC results and plan to recheck labs in 6 weeks. Callback number left for questions or concerns. Labs and plan reviewed with Limmie Patricia, ARNP, who placed lab orders. Will message TC to schedule.     Addendum: Pt called back, states at her last visit with Lesly Rubenstein, they discussed a hematology referral if Nora had not improved. RN to ask provider to place referral. No further questions or concerns.     ----- Message from Rolland Porter, RN sent at 02/13/2021  9:56 AM PDT -----  Regarding: Glide check  Doristine Counter,     Edwena Bunde been following this pt's [place]. She's on letrozole. Last labs drawn on 5/31, ANC 1.86, which was an improvement from 5/9. Decreased again today to 1.46.     Next lab recheck and visit with Dr. Charna Archer 8/24.    Results for KEELA, RUBERT (MRN [id number]) as of 02/13/2021 09:56    02/13/2021 09:28  WBC: 2.62 (L)  RBC: 4.03  Hemoglobin: 12.0  Hematocrit: 35 (L)  MCV: 87  MCH: 29.8  MCHC: 34.4  Platelet Count: 173  RDW-CV: 12.3  % Neutrophils: 56  % Lymphocytes: 31  % Monocytes: 10  % Eosinophils: 3  % Basophils: 1  % Immature Granulocytes: 0  Neutrophils: 1.46 (L)  Absolute Lymphocyte Count: 0.81 (L)  Monocytes: 0.25  Absolute Eosinophil Count: 0.08  Basophils: 0.02  Immature Granulocytes: 0.00  WBC Morphology: See Diff  RBC Morphology: See RBC data  Platelet Morphology: See PLT count    From 5/9 note:    Antoria is doing well overall today from a breast cancer perspective without evidence of recurrence based on review of systems and clinical exam. R breast mammogram due 06/2021. We discussed a referral to cancer rehab to discuss her fatigue, lymphedema, arthralgias, etc as it relates to her breast cancer treatment.   Labs today with low WBC. Differential added after the visit showed a low ANC and lymphocyte count. We will repeat this in 6 weeks, and then again in 12 weeks. Remainder of labs within normal limits.    Thanks,   Caryl Asp

## 2021-02-13 NOTE — Progress Notes (Signed)
Department of Physical Medicine & Rehabilitation  Cancer Rehabilitation  Name: Yolanda Bowers  DOB: 14-Jan-1971  MRN: [id number]      ASSESSMENT:  50 year old R handed woman with history of L breast cancer (ER/PR/HER2+) s/p ddAC-THP, L mastectomy and SLNbiopsy 06/25/2019, XRT completed 09/21/2019, now on letrozole who presents with joint pain and stiffness, likely aromatase inhibitor associated arthralgias.     PLAN:  Aromatase inhibitor associated arthralgias; fatigue  R hip pain likely hip flexor strain, bilateral knee pain; underlying component of joint hypermobility  - discussed contributing factors and management to date  - will send patient hip and core strengthening exercises  - will obtain xrays of R hip pain if it does not improve with exercises    R shoulder pain 2/2 adhesive capsulitis - improving  L arm lymphedema  - has a sleeve/garment that she wears  - continue rotator cuff strengthening exercises    Bilateral carpal tunnel  - had EMG done at outside institution  - recommend using neutral wrist splints at night    Follow up in 2-3 months, sooner if needed    Werner Lean, MD  >60 minutes were spent during the encounter, inclusive of a physical examination, chart review, documentation, counseling on the above plan, care coordination.     ---------------------------------------------------------------------------------------------------------------------  SUBJECTIVE  CC: Pain and Fatigue      HPI:    The patient is a 50 year old R handed woman with history of L breast cancer (ER/PR/HER2+) s/p ddAC-THP, L mastectomy and SLNbiopsy 06/25/2019, XRT completed 09/21/2019, now on letrozole who presents for initial visit with rehabilitation medicine. Prior to her cancer diagnosis, she reports being very active - played volleyball in college, hikes, exercised regularly.   She has worked as a Pharmacist, hospital throughout treatment, but has had significant fatigue in throughout the past school year. Of note, she reports  feeling "knocked out" by the 3rd cycle of chemotherapy and had to stop taking exercise classes.   She currently experiences joint stiffness and pain in the mornings. This involves the low back, knees, hips. She has had PT for the shoulder due to hx of adhesive capsulitis that is improving. Once she is moving around and walking, stiffness improves. She denies any joint swelling, buckling, locking. Denies radicular pain in the extremities.  Back pain is located around the lumbosacral junction, gluteal musculature, and posterior hip. Hip ROM has been preserved.   Knee pain and stiffness is around the anterior knee/patella.    She also has had symptoms of tingling in the hands for multiple months, for which she had an EMG and was told she has carpal tunne.   She takes intermittent tylenol. With aerobic activity, she experiences dyspnea on exertion which improves with rest.   Functional history: independent in mobility and ADLs    Review of Systems  General: weight gain (+)  Bones/Joints: joint pain (+), joint swelling, (-)  Neuro/psych: weakness (-), difficulty walking (-), loss of balance (-), fainting spells (-), fatigue (+), numbness (-)      HISTORY  Past Medical History:     Past Medical History:   Diagnosis Date    Chemotherapy adverse reaction 06/25/2019    Allergic rhinitis due to allergen     Anemia     Anxiety     Breast cancer ([place])     Burn     Cancer ([facility])     Congestive heart failure ([facility])     Headache  Past Surgical History:     Past Surgical History:   Procedure Laterality Date    BREAST BIOPSY Right 11/2018    2 core bx      BREAST RECONSTRUCTION Right 06/25/2019    reduction - Dr. Lowella Dell    PR MASTECTOMY SIMPLE COMPLETE Left 06/25/2019    Had radiation       Review of patient's allergies indicates:  Allergies   Allergen Reactions    Oxycodone Skin: Itching    Codeine JS:EGBTDV/VOHYWVPX    Lisinopril Resp: Cough    Walnut Skin: Rash    Paclitaxel Skin: Hives       Current  Outpatient Medications   Medication Instructions    acetaminophen 500 MG tablet Take 1-2 tabs by mouth once daily as needed for pain.    aspirin 81 mg, Oral    Calcium Citrate-Vitamin D (CITRACAL + D OR) 1 tablet, Oral, Daily    cetirizine 10 MG tablet Oral    famotidine (PEPCID) 20 mg, Oral, Daily PRN    gabapentin 100 MG capsule Take '100mg'$  qHS for night sweats. Can increase to '200mg'$  if no effect after 5-7 days. Can increase to '300mg'$  as tolerated.    letrozole [facility]) 2.5 mg, Oral, Daily    metoprolol succinate ER (TOPROL XL) 25 mg, Oral, Daily, Do not chew or crush.    valsartan (DIOVAN) 40 mg, Oral, 2 times daily    venlafaxine ER (EFFEXOR XR) 75 mg, Oral, Every morning       Family History     Problem (# of Occurrences) Relation (Name,Age of Onset)    Alcohol/Drug (1) Maternal Grandfather    Diabetes (1) Mother    Hearing Loss (2) Mother, Father    Obesity (2) Mother, Maternal Grandfather    Ovarian Cancer (1) Paternal Aunt (20)    Skin Cancer (2) Father, Maternal Grandfather          Social History     Tobacco Use    Smoking status: Never Smoker    Smokeless tobacco: Never Used   Substance Use Topics    Alcohol use: Not Currently    Drug use: Not Currently       RESULTS: 08/07/2020 [place] upper extremity Left - per radiology report  FINDINGS: Normal grayscale of the left internal jugular, innominate, subclavian, axillary, brachial, basilic, cephalic, ulnar and radial veins. The veins are compressible. The color and spectral doppler are patent with anterograde waveforms.     IMPRESSION No evidence of left upper extremity deep venous thrombosis.       PHYSICAL EXAM:  Vitals: BP 123/76    Pulse 61    Temp 37 C (Oral)    Resp 16    Wt 74.5 kg (164 lb 3.9 oz)    SpO2 100%    BMI 26.51 kg/m   - Gen: Alert and able to follow simple commands, no acute distress  - Psych: Normal mood and affect, responds appropriately to commands  - HEENT: Anicteric, face symmetric  - CV:  no varicosities noted  - GI/Abd:  Nondistended  - Resp: Breathing unlabored, symmetric chest wall movements  - Extr: No clubbing/cyanosis, L arm lymphedema  - Skin: Intact skin, non-erythematous, no rashes or breakdown appreciated on extremities    Neuro   - Mental Status: Alert and able to follow simple commands  - Language/Speech: Fluency intact, comprehensible speech  - Movement: No tremor, chorea, dystonia or asterixis      Motor strength:   Right Left  Sh Abduction 5 5   Elbow Flexion 5 5   Elbow Extension 5 5   Wrist Extension 5 5   Finger Flexion 5 5   Finger Abduction 5 5   Hip Abduction 5 5   Hip Adduction 5 5   Hip Flexion 5 5   Knee Extension 5 5   Knee Flexion 5 5   Ankle Dorsiflex 5 5   Ankle Plantarflex 5 5       - Gait: non-antalgic with normal walking and tandem gait, able to heel and toe walk     MSK Lumbosacral Spine (R/L)  - Stability: No gross instability  - Inspection: Atrophy (-)/(-), Effusion (-)/(-), no visible surgical scars  - Alignment: Spine is midline, with no evidence of scoliosis  - Tender to palpation: Spinous Processes (-), Paraspinals (-)/(-)  - ROM: Flexion, extension, left oblique extension, right oblique extension full and painless  Special Tests:   SLR (-)/(-)    MSK Hip (R/L)  - Inspection: Atrophy (-)/(-), Effusion (-)/(-)  - Alignment: Normal  - Tender to palpation: Anterior Hip by hip flexors (-)/(-), Adductors (-)/(-), PSIS (+)/(+), Sciatic Notch (-)/(-), Glute musculature (+)/(+), Greater Trochanter (+)/(+), IT Band (-)/(-)  - ROM while lying supine and hip flexed 90 degrees:  Internal rotation 50  External rotation 80  Knees hyperextend 5-10 degrees bilaterally  Special tests: Stinchfield (+ on right)/(-), Log Roll (-)/(-), Active SLR (-)/(-), Scour (-)/(-), Patrick's/FABER (+ reproduces posterior hip/low back pain),FADDIR (-)/(-), Sacroiliac Rocking (-)/(-)

## 2021-02-13 NOTE — Patient Instructions (Addendum)
Start hip and core muscle exercises. I will send these to you via Mychart    Limit exertion to < 6/10 when exercising    Use "neutral wrist braces" on both hands when sleeping    Follow up in 4-5 months

## 2021-02-14 ENCOUNTER — Encounter (HOSPITAL_BASED_OUTPATIENT_CLINIC_OR_DEPARTMENT_OTHER): Payer: Self-pay | Admitting: Student in an Organized Health Care Education/Training Program

## 2021-03-03 ENCOUNTER — Ambulatory Visit: Payer: Commercial Managed Care - PPO | Attending: Cardiovascular Disease | Admitting: Cardiovascular Disease

## 2021-03-03 ENCOUNTER — Other Ambulatory Visit: Payer: Self-pay

## 2021-03-03 VITALS — BP 111/70 | HR 63 | Temp 97.2°F | Ht 66.0 in | Wt 162.0 lb

## 2021-03-03 DIAGNOSIS — T451X5A Adverse effect of antineoplastic and immunosuppressive drugs, initial encounter: Secondary | ICD-10-CM

## 2021-03-03 DIAGNOSIS — I429 Cardiomyopathy, unspecified: Secondary | ICD-10-CM | POA: Insufficient documentation

## 2021-03-03 DIAGNOSIS — E71314 Muscle carnitine palmitoyltransferase deficiency: Secondary | ICD-10-CM | POA: Insufficient documentation

## 2021-03-03 DIAGNOSIS — E876 Hypokalemia: Secondary | ICD-10-CM | POA: Insufficient documentation

## 2021-03-03 DIAGNOSIS — I427 Cardiomyopathy due to drug and external agent: Secondary | ICD-10-CM | POA: Insufficient documentation

## 2021-03-03 DIAGNOSIS — I493 Ventricular premature depolarization: Secondary | ICD-10-CM

## 2021-03-03 MED ORDER — POTASSIUM CHLORIDE ER 10 MEQ OR TBCR
10.0000 meq | EXTENDED_RELEASE_TABLET | Freq: Every day | ORAL | 3 refills | Status: DC
Start: 2021-03-03 — End: 2022-02-15

## 2021-03-03 NOTE — Progress Notes (Signed)
CARDIOLOGY CLINIC NOTE    Name:  Yolanda Bowers  Date of Birth: 1971-01-02  Medical Record Number: [id number]  Primary Care Physician:  Margreta Journey, MD  Referring Provider:  Teena Dunk, *  Date of Service: 03/03/2021    ID  Yolanda Bowers is a 50 year old female with history of high-grade infiltrating ductal carcinoma of the left breast (ER+/PR+, FISH +) with angiolymphatic invasion, history of neoadjuvant chemotherapy with ddAC-THP, left mastectomy with lymph node biopsy, received 4 or 6 cycles of herceptin and Perjeta due to drop in EF, history of PVC ablation on 03/21/2020, here for follow-up.    Chief Complaint/Reason for Visit  Follow-Up     Last seen as telemedicine on 10/28/2020.    Saw breast clinic:  Stable, no changes.    Port removed on 11/24/2020.- infected.  Was on antibiotics briefly.    Overall feeling good. But still do not have much energy.  Wiped out after activity.  Can't do the long walks as before.    Snow shoeing was OK, but had to stop.      Get chest pain once in a while.    She participated with rehab-->  Noticed core strength has been weakening.  She is performing exercises to try to strengthen muscle from hip below.    Her heart rate races often.  No dizziness, lightheadedness.        Patient Active Problem List    Diagnosis Date Noted   . 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 Anxiety [F41.9] 10/28/2020   . PVC's (premature ventricular contractions) [I49.3] 02/18/2020     Ablation on 03/21/2020:   Successful ablation for PVCs from the interleaflet trigone in the LCC/RCC commisure below the aortic valve     . 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 The left ventricle (LV) is mildly dilated (LVEDVi= 94 ml/m2) with low normal systolic function (LVEF= 75%). No regional wall motion abnormalities. There is no LV hypertrophy (LVmassI= 45 gm/m2).    2. The right ventricle (RV) is normal in size (RVEDVi= 83 ml/m2) with normal systolic function (RVEF= 64%).     3. On delayed enhancement imaging there is intramyocardial late gadolinium enhancement at the inferior RV attachment site.    4. On limited T2-weighted imaging there is no evidence of myocardial edema.    5. On T1-mapping, the native T1 value is  1019ms (normal).    6. No significant valvular abnormalities.    7. Left atrial area measures 25cm2 and LAVI= 76mL/m2 (dilated) via biplanar method; Right atrial area measures 14cm2 via 4-chamber monoplanar method.         . Malignant neoplasm of upper-outer quadrant of left breast in female, estrogen receptor positive ([place]) [C50.412, Z17.0] 11/18/2019   . Macromastia [N62] 08/31/2019   .  CIN I (cervical intraepithelial neoplasia I) [N87.0] 06/01/2018     Review of patient's allergies indicates:  Allergies   Allergen Reactions   . Oxycodone Skin: Itching   . Codeine AY:[id number]/[id number]   . Lisinopril Resp: Cough   . Walnut Skin: Rash   . Paclitaxel Skin: Hives     Current Outpatient Medications   Medication Sig Dispense Refill   . acetaminophen 500 MG tablet Take 1-2 tabs by mouth once daily as needed for pain.     Marland Kitchen aspirin 81 MG EC tablet Take 81 mg by mouth.     . Calcium Citrate-Vitamin D (CITRACAL + D OR) Take 1 tablet by mouth daily.     . cetirizine 10 MG tablet Take by mouth.     . famotidine 20  MG tablet Take 1 tablet (20 mg) by mouth daily as needed for indigestion/heartburn.     . letrozole 2.5 MG tablet Take 1 tablet (2.5 mg) by mouth daily. 90 tablet 3   . metoprolol succinate ER 25 MG 24 hr tablet Take 1 tablet (25 mg) by mouth daily. Do not chew or crush. 90 tablet 3   . potassium chloride ER 10 MEQ ER tablet Take 1 tablet (10 mEq) by mouth daily. 90 tablet 3   . valsartan 40 MG tablet Take 1 tablet (40 mg) by mouth 2 times a day. 180 tablet 3   . venlafaxine ER 75 MG 24 hr capsule Take 1 capsule (75 mg) by mouth every morning. 90 capsule 0     No current facility-administered medications for this visit.       Review of Systems:  A complete review of systems was otherwise negative except as noted above.   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      Physical Exam:  Wt Readings from Last 3 Encounters:   03/03/21 73.5 kg (162 lb)   02/13/21 74.5 kg (164 lb 3.9 oz)   12/29/20 72.9 kg (160 lb 11.5 oz)     BP 111/70   Pulse 63   Temp 36.2 C (Temporal)   Ht 5\' 6"  (1.676 m)   Wt 73.5 kg (162 lb)   SpO2 98%   BMI 26.15 kg/m     General: well developed female  in no acute distress  Eyes:  Anicteric sclera, PERRLA  Neck: No masses, normal carotid pulses without audible bruit, thyroid gland is normal in size  Chest:  Lungs are clear to auscultation without wheezes, rales, or rhonchi  Cardiovascular: regular rate and rhythm, normal S1 and S2, no S3 or S4, 1/6 holosystolic murmur, PMI felt midclavicular, JVP of 5-6 cm  Abdomen: soft non-tender, non-distended with normal bowel sounds, liver span felt at the costal margin, no splenomegaly  Extremities: warm to touch bilaterally with +2 pulses in the femoral, popliteal, tibial,  dorsalis pedis bilaterally , no edema bilaterally  Musculoskeletal:  Back is symmetric, no curvature, no tenderness.  Skin:  Normal skin color, texture.  No rashes or lesions  Neurologic:  Gait is normal, sensation grossly normal  Psychiatric:  Appropriate and responds to questions    Laboratory Results:   Results for orders placed or performed in visit on 02/13/21   CBC with Differential  Result Value Ref Range    WBC 2.62 (L) 4.3 - 10.0 10*3/uL    RBC 4.03 3.80 - 5.00 10*6/uL    Hemoglobin 12.0 11.5 - 15.5 g/dL    Hematocrit 35 (L) 36.0 - 45.0 %    MCV 87 81 - 98 fL    MCH 29.8 27.3 - 33.6 pg    MCHC 34.4 32.2 - 36.5 g/dL    Platelet Count 173 150 - 400 10*3/uL    RDW-CV 12.3 11.6 - 14.4 %    % Neutrophils 56 %    % Lymphocytes 31 %    % Monocytes 10 %    % Eosinophils 3 %    % Basophils 1 %    % Immature Granulocytes 0 %    Neutrophils 1.46 (L) 1.80 - 7.00 10*3/uL    Absolute Lymphocyte Count 0.81 (L) 1.00 - 4.80 10*3/uL    Monocytes 0.25 0.00 - 0.80 10*3/uL    Absolute Eosinophil Count 0.08 0.00 - 0.50 10*3/uL    Basophils 0.02 0.00 - 0.20 10*3/uL    Immature Granulocytes 0.00 0.00 - 0.05 10*3/uL    RBC Morphology See RBC data     Platelet Morphology See PLT count     WBC Morphology See Diff      WBC   Date Value Ref Range Status   12/20/2019 3.43 (L) 4.3 - 10.0 10*3/uL Final   11/06/2019 2.73 (L) 4.3 - 10.0 10*3/uL Final   09/24/2019 2.04 (L) 4.3 - 10.0 10*3/uL Final     Results for orders placed or performed in visit on 01/30/20   Lipid Panel   Result Value Ref Range    Cholesterol (Total) 165 <200 mg/dL    Triglyceride 106 <150 mg/dL    Cholesterol (HDL) 63 >39 mg/dL    Cholesterol (LDL) 81 <130 mg/dL    Non-HDL Cholesterol 102 0 - 159 mg/dL    Cholesterol/HDL Ratio 2.6     Lipid Panel, Additional Info. (NOTE)      Labs discussed with patient and provided in after visit summary.     Imaging:    EKG:01/11/2020:  Normal sinus rhythm, heart rate of 74 bpm, normal PR interval, premature ventricular contractions,  LAE    cardiopulmonary exercise test on 05/30/2020:  Max VO2 15 ml/kg/min (66%) , RER 1.21, normal blood pressure response to exercise, normal heart rate response, O2 pulse of 82%, 30% MVV    Echocardiogram from 07/29/2020:  Normal LV size and functoin EF of 61% with GLS of -19%, normal RV size and function, minimal MR..  EF has improved from 12/2019.    Holter monitor 07/29/2020:  Predominant rhythm: NSR  . Atrial Tachycardia (AT) 13 episodes, Longest 5 beats @ Avg  83 bpm up to 103 bpm, Fastest 4 beats @ Avg 143 bpm up to  177 bpm  . Accelerated Idioventricular Rhythm (AIVR)  . PAC 0.03 %  . PVC 0.11 %    GDMT:  ACEI/ARB/ARNI: valsartan 40mg  BID  Beta-blockers: toprol XL 12.5mg  daily  MRAs:  SGLT2 inhibitor:  Diuretics:  Statin:  Digoxin:     Assessment:   49 year old female with history of high-grade infiltrating ductal carcinoma of the left breast (ER+/PR+, FISH +) with angiolymphatic invasion, history of neoadjuvant chemotherapy with ddAC-THP, left mastectomy with lymph node biopsy, received Herceptin and Perjeta, which was discontinued after persistent decline in EF.  She completed 4 of 6 cycles.      She underwent recent PVC ablation for >21%  PVC without evidence of pathologic LGE to suggest a risk for VT.  She did not need an ICD and is currently on metoprolol.  Amiodarone was stopped.  In regards to her cardiomyopathy, MRI shows EF of 53%, which is hopeful in regards to future myocardial recovery which could have been exacerbated by her PVC burden. However, I suspect she had developed some dysfunction secondary to her chemotherapy based on presence of LA enlargement.    She is doing well from cardiac standpoint and the fatigue that is noted with exertion is likely from ongoing deconditioning with core muscle strength weakness.  She will benefit with exercises to build her endurance up.  Potassium remains lower.  May be contributing to ectopy.    Plan:  1.  Start potassium 23meq daily to maintain K >4.0  2.   Continue core strengthening exercises to help with overall endurance  3.  Monitor for fluid retention changes  4.  Echocardiogram during next follow-up visit   5.  Continue with current meds as noted above.      Follow-up:  Follow-up in 5 months.   Patient will call sooner if symptoms change.    I spent a total of 37 minutes for the patient's care on the date of the service.  This includes the time reviewing the chart, time with the patient in the exam room,  and time completing the note.     Juanetta Beets, MD  Division of Cardiology  Advanced Heart Failure/Cardiac Transplantation  03/03/2021

## 2021-03-03 NOTE — Patient Instructions (Addendum)
Potassium is low.  Start 1meq daily    Continue with meds    Echocardiogram during next follow-up     If you notice worsening fatigue/palpitations, please call    Core strengthening exercises will help you with endurance--as discussed in clinic    Follow-up in 5 months.  Echocardiogram on same day     If you have any questions, please call my RN, Maegan at [phone] for all urgent matters.  Otherwise, you can also send [place] a eCare message.  Please allow at least 48 hours for a response.   If you don't hear back, please call Maegan.

## 2021-03-05 ENCOUNTER — Telehealth: Payer: Self-pay

## 2021-03-05 NOTE — Telephone Encounter (Signed)
Tried contacting pt about Dr Artis Flock leaving, number was out of service

## 2021-03-06 ENCOUNTER — Encounter (HOSPITAL_BASED_OUTPATIENT_CLINIC_OR_DEPARTMENT_OTHER): Payer: Self-pay

## 2021-03-09 ENCOUNTER — Ambulatory Visit: Payer: Commercial Managed Care - PPO | Attending: Internal Medicine | Admitting: Internal Medicine

## 2021-03-09 ENCOUNTER — Ambulatory Visit (HOSPITAL_BASED_OUTPATIENT_CLINIC_OR_DEPARTMENT_OTHER): Payer: Commercial Managed Care - PPO

## 2021-03-09 ENCOUNTER — Telehealth (HOSPITAL_BASED_OUTPATIENT_CLINIC_OR_DEPARTMENT_OTHER): Payer: Self-pay | Admitting: Internal Medicine

## 2021-03-09 VITALS — BP 113/71 | HR 65 | Temp 98.2°F | Resp 16 | Ht 65.43 in | Wt 163.1 lb

## 2021-03-09 DIAGNOSIS — D709 Neutropenia, unspecified: Secondary | ICD-10-CM | POA: Insufficient documentation

## 2021-03-09 DIAGNOSIS — Z79811 Long term (current) use of aromatase inhibitors: Secondary | ICD-10-CM | POA: Insufficient documentation

## 2021-03-09 DIAGNOSIS — Z808 Family history of malignant neoplasm of other organs or systems: Secondary | ICD-10-CM | POA: Insufficient documentation

## 2021-03-09 DIAGNOSIS — Z170 Estrogen receptor positive status [ER+]: Secondary | ICD-10-CM | POA: Insufficient documentation

## 2021-03-09 DIAGNOSIS — R232 Flushing: Secondary | ICD-10-CM | POA: Insufficient documentation

## 2021-03-09 DIAGNOSIS — Z8041 Family history of malignant neoplasm of ovary: Secondary | ICD-10-CM | POA: Insufficient documentation

## 2021-03-09 DIAGNOSIS — Z853 Personal history of malignant neoplasm of breast: Secondary | ICD-10-CM

## 2021-03-09 DIAGNOSIS — C50412 Malignant neoplasm of upper-outer quadrant of left female breast: Secondary | ICD-10-CM | POA: Insufficient documentation

## 2021-03-09 DIAGNOSIS — E538 Deficiency of other specified B group vitamins: Secondary | ICD-10-CM

## 2021-03-09 LAB — CBC, DIFF
% Basophils: 1 %
% Eosinophils: 2 %
% Immature Granulocytes: 0 %
% Lymphocytes: 25 %
% Monocytes: 6 %
% Neutrophils: 66 %
Absolute Eosinophil Count: 0.08 10*3/uL (ref 0.00–0.50)
Absolute Lymphocyte Count: 0.82 10*3/uL — ABNORMAL LOW (ref 1.00–4.80)
Basophils: 0.02 10*3/uL (ref 0.00–0.20)
Hematocrit: 38 % (ref 36.0–45.0)
Hemoglobin: 12.5 g/dL (ref 11.5–15.5)
Immature Granulocytes: 0.01 10*3/uL (ref 0.00–0.05)
MCH: 29.7 pg (ref 27.3–33.6)
MCHC: 33.2 g/dL (ref 32.2–36.5)
MCV: 90 fL (ref 81–98)
Monocytes: 0.21 10*3/uL (ref 0.00–0.80)
Neutrophils: 2.18 10*3/uL (ref 1.80–7.00)
Platelet Count: 191 10*3/uL (ref 150–400)
RBC: 4.21 10*6/uL (ref 3.80–5.00)
RDW-CV: 12.8 % (ref 11.6–14.4)
WBC: 3.32 10*3/uL — ABNORMAL LOW (ref 4.3–10.0)

## 2021-03-09 LAB — FOLATE & VITAMIN B12
Folate, SRM: >22 ng/mL (ref >5.8)
Vitamin B12 (Cobalamin): 330 pg/mL (ref 180–914)

## 2021-03-09 LAB — HEPATITIS B SURFACE AB
Hepatitis B Surf Antibody Intl Units: >1000 m[IU]/mL
Hepatitis B Surface Ab: REACTIVE

## 2021-03-09 LAB — HEPATITIS C AB WITH REFLEX PCR: Hepatitis C Antibody w/Rflx PCR: NONREACTIVE

## 2021-03-09 LAB — HEPATITIS B SURFACE ANTIGEN W/REFLEX: Hepatitis B Surface Antigen w/Reflex: NONREACTIVE

## 2021-03-09 LAB — SMEAR SCAN

## 2021-03-09 LAB — HEPATITIS B CORE AB (HBCAB): Hepatitis B Core Ab: NONREACTIVE

## 2021-03-09 NOTE — Patient Instructions (Addendum)
Nice to meet you today.    Labs today    See me in 3 months with blood count check prior to visit or sooner as needed    Please call clinic [phone] with questions.  Daniel Nones, MD

## 2021-03-09 NOTE — Telephone Encounter (Signed)
Spoke with pt regarding Dr. Ebony Cargo message:     "her wbc is better and ANC normalized...lets have her recheck once more in 3 months but i think this is just longer chemo recovery as we discussed"    Pt verbalized understanding and willing to follow instructions without further questions.

## 2021-03-09 NOTE — Progress Notes (Addendum)
[place]    Hematology Outpatient Consultation     Date of Service: 03/09/2021    Referring Provider: Bonnielee Haff, [place] [address]    PCP: Margreta Journey, MD    Reason for Consult:  Neutropenia    History of Present Illness:  Yolanda Bowers is a very pleasant 49 year old woman with history of cT3N3 left breast invasive ductal carcinoma diagnosed in 2020, ER/PR/HER2+, s/p neoadjuvant chemotherapy with ddAC-THP, left mastectomy and SLN biopsy, and adjuvant radiation therapy completed 09/9560, complicated by CHF, s/p cardiac PVC ablation 03/2020, referred by Medical Oncology Exie Parody, MD for neutropenia.    She feels well today.  She has normal baseline CBC prior to therapy for breast cancer in 12/2018 (12/29/18 WBC 4.36, ANC 3.03, Hgb 12, Hct 36, MCV 88, plts 205k).  She was in general health until she self-identified a L axillary breast mass 10/2018. She was diagnosed with cT3N3 left breast invasive ductal carcinoma diagnosed in 2020, ER/PR/HER2+, treated with neoadjuvant ddAC x 4 and 4 of 6 cycles THP completed 05/2019. She had G-CSF with chemotherapy and ANC ranged 0.9-3.1 during therapy.  She underwent L mastectomy and SLN biopsy 06/2019 (Dr. Imagene Sheller). Pathology revealed a single focus of angiolymphatic involvement by carcinoma (< 0.1cm). Negative surgical margins and 0 of 5 lymph nodes positive for carcinoma. ypT0 ypN0.  She had adjuvant radiation, completed XRT 08/2019. ANC was low 1.42 following radiation 09/2019.  Since completing therapy, she has had mild leukopenia (WBC ranging ~2 to 4) and ANC has been fluctuating from borderline low to normal intermittently.  Most recently CBC prior to initial hematology consultation 02/13/21 WBC 2.62, Hgb 12, Hct 35, MCV 87, plts 173k.    Chemotherapy was complicated by systolic heart failure, EF 45%.She was admitted 03/2020 for increased fatigue and Holter monitor showed  high burden PVCs and several episodes of VT. She was initiated on metoprolol and amiodarone. She had PVC ablation and amiodarone was stopped.  She remains on metoprolol and valsartan.  She has continued fatigue, especially with exertion.    She had a port site infection 11/24/20 at site of port removal 1 month prior. She was treated with 10 day course of Bactrim.   She denies other infections. No chronic infections in adulthood.   She has joint aches in knees and hips that she attributes to the start of letrozole.   No new medications in last 6 months. Denies supplements.    No personnel history of autoimmune conditions.  Mother has RA.     Denies weight loss, adenopathy. She has hot flashes. LMP May 2021.  She had episode of hematuria after a long snow shoe hike. She was evaluated at [facility] PCP> UA 03/05/21 showed 1+ hematuria. She had pelvic US 03/06/21 which was normal. Hematuria has since resolved.    09/2020 TSH wnl.   B12 borderline low in past 05/13/20 307. She does not take b12 supplement.      Review of Systems:   A complete ROS is negative with with the exception of the pertinent positives which are noted in the HPI.               Past Medical History:  Patient Active Problem List    Diagnosis Date Noted   . Anxiety [F41.9] 10/28/2020   . PVC's (premature ventricular contractions) [I49.3] 02/18/2020     Ablation  on 03/21/2020:   Successful ablation for PVCs from the interleaflet trigone in the LCC/RCC commisure below the aortic valve     . 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 The left ventricle (LV) is mildly dilated (LVEDVi= 94 ml/m2) with low normal systolic function (LVEF= 69%). No regional wall motion abnormalities. There is no LV hypertrophy (LVmassI= 45 gm/m2).    2. The right ventricle (RV) is normal in size (RVEDVi= 83 ml/m2) with normal systolic function (RVEF= 48%).     3. On delayed enhancement imaging there is intramyocardial late gadolinium enhancement at the inferior RV attachment site.    4. On limited T2-weighted imaging there is no evidence of myocardial edema.    5. On T1-mapping, the native T1 value is  1017m (normal).    6. No significant valvular abnormalities.    7. Left atrial area measures 25cm2 and LAVI= 430mm2 (dilated) via biplanar method; Right atrial area measures 14cm2 via 4-chamber monoplanar method.             . 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 Malignant neoplasm of upper-outer quadrant of left breast in female, estrogen receptor positive ([place][C50.412, Z17.0] 11/18/2019   . Macromastia [N62] 08/31/2019   . CIN I (cervical intraepithelial neoplasia I) [N87.0] 06/01/2018       Medications:  Current Outpatient Medications   Medication Sig Dispense Refill   . acetaminophen 500 MG tablet Take 1-2 tabs by mouth once daily as needed for pain.     . Marland Kitchenspirin 81 MG EC tablet Take 81 mg by mouth.     . Calcium Citrate-Vitamin D (CITRACAL + D OR) Take 1 tablet by mouth daily.     . cetirizine 10 MG tablet Take by mouth.     . famotidine 20 MG tablet Take 1 tablet (20 mg) by mouth daily as needed for indigestion/heartburn.     . letrozole 2.5 MG tablet Take 1 tablet (2.5 mg) by mouth daily. 90 tablet 3   . metoprolol succinate ER 25 MG 24 hr tablet Take 1 tablet (25 mg) by mouth daily. Do not chew or crush. 90  tablet 3   . potassium chloride ER 10 MEQ ER tablet Take 1 tablet (10 mEq) by mouth daily. 90 tablet 3   . valsartan 40 MG tablet Take 1 tablet (40 mg) by mouth 2 times a day. 180 tablet 3   . venlafaxine ER 75 MG 24 hr capsule Take 1 capsule (75 mg) by mouth every morning. 90 capsule 0     No current facility-administered medications for this visit.       Allergies:  Review of patient's allergies indicates:  Allergies   Allergen Reactions   . Oxycodone Skin: Itching   . Codeine GINI:OEVOJJ/[id number] . Lisinopril Resp: Cough   .  Walnut Skin: Rash   . Paclitaxel Skin: Hives       Family History:  Paternal aunt with ovarian cancer.   Family History     Problem (# of Occurrences) Relation (Name,Age of Onset)    Alcohol/Drug (1) Maternal Grandfather    Diabetes (1) Mother    Hearing Loss (2) Mother, Father    Obesity (2) Mother, Maternal Grandfather    Ovarian Cancer (1) Paternal Aunt (69)    Skin Cancer (2) Father, Maternal Grandfather          Social History:  Married. She has 72 yo son. Nonsmoker.  Social History     Socioeconomic History   . Marital status: Married     Spouse name: Not on file   . Number of children: Not on file   . Years of education: Not on file   . Highest education level: Not on file   Occupational History   . Occupation: Pharmacist, hospital   Tobacco Use   . Smoking status: Never Smoker   . Smokeless tobacco: Never Used   Substance and Sexual Activity   . Alcohol use: Not Currently   . Drug use: Not Currently   . Sexual activity: Not on file   Other Topics Concern   . Not on file   Social History Narrative    Lives in [place] with her husband and two children, ages 72 and 29. She works as an Metallurgist.      Social Determinants of Health     Financial Resource Strain: Not on file   Food Insecurity: Not on file   Transportation Needs: Not on file   Physical Activity: Not on file   Stress: Not on file   Social Connections: Not on file   Intimate Partner Violence: Not on file   Housing Stability: Not on file              Physical Exam:  VS: BP 113/71   Pulse 65   Temp 36.8 C (Oral)   Resp 16   Ht 5' 5.43" (1.662 m)   Wt 74 kg (163 lb 2.3 oz)   SpO2 100%   BMI 26.79 kg/m     Gen:  well appearing woman, in no apparent distress   HEENT:  NCAT, conjunctivae nonicteric,PER, MMM  Neck:  supple, no palpable adenopathy or thyromegaly  Lymph:  no palpable cervical or supraclavicular adenopathy  Chest:  CTA bilaterally without crackles or wheezes  CV:  RRR without murmurs, rubs or gallops  Abd:  soft, NT/ND, BS are present and normal, no HSM, no palpable mass  Neuro: A&Ox3, grossly nonfocal  Psych: mood and affect are normal    Skin: exposed skin without rashes  Ext:  warm, well-perfused, no lower extremity edema            Results:  Personally reviewed available laboratory and imaging results.     Lab:   Results for orders placed or performed in visit on 02/13/21   CBC with Differential   Result Value Ref Range    WBC 2.62 (L) 4.3 - 10.0 10*3/uL    RBC 4.03 3.80 - 5.00 10*6/uL    Hemoglobin 12.0 11.5 - 15.5 g/dL    Hematocrit 35 (L) 36.0 - 45.0 %    MCV 87 81 - 98 fL    MCH 29.8 27.3 - 33.6 pg    MCHC 34.4 32.2 - 36.5 g/dL    Platelet Count 173 150 - 400 10*3/uL  RDW-CV 12.3 11.6 - 14.4 %    % Neutrophils 56 %    % Lymphocytes 31 %    % Monocytes 10 %    % Eosinophils 3 %    % Basophils 1 %    % Immature Granulocytes 0 %    Neutrophils 1.46 (L) 1.80 - 7.00 10*3/uL    Absolute Lymphocyte Count 0.81 (L) 1.00 - 4.80 10*3/uL    Monocytes 0.25 0.00 - 0.80 10*3/uL    Absolute Eosinophil Count 0.08 0.00 - 0.50 10*3/uL    Basophils 0.02 0.00 - 0.20 10*3/uL    Immature Granulocytes 0.00 0.00 - 0.05 10*3/uL    RBC Morphology See RBC data     Platelet Morphology See PLT count     WBC Morphology See Diff        Lab Results   Component Value Date    WBC 2.62 (L) 02/13/2021    HEMOGLOBIN 12.0 02/13/2021    MCV 87 02/13/2021    PLATELET 173 02/13/2021          Ref. Range 12/29/2018 14:21   WBC Latest Ref Range: 4.3 - 10.0 10*3/uL 4.36    RBC Latest Ref Range: 3.80 - 5.00 10*6/uL 4.07   Hemoglobin Latest Ref Range: 11.5 - 15.5 g/dL 12.0   Hematocrit Latest Ref Range: 36 - 45 % 36   MCV Latest Ref Range: 81 - 98 fL 88   MCH Latest Ref Range: 27.3 - 33.6 pg 29.5   MCHC Latest Ref Range: 32.2 - 36.5 g/dL 33.6   Platelet Count Latest Ref Range: 150 - 400 10*3/uL 205   RDW-CV Latest Ref Range: 11.6 - 14.4 % 12.3         Lab Results   Component Value Date    POTASSIUM 3.5 (L) 12/29/2020    CL 103 12/29/2020    CO2 30 12/29/2020    IONGAP 7 12/29/2020    GLUCOSE 70 12/29/2020    BUN 18 12/29/2020    CREATININE 0.85 12/29/2020       Lab Results   Component Value Date    ALBUMIN 4.4 12/29/2020    BILIRUBN 0.4 12/29/2020    BILIRUBNDIR 0.1 01/30/2020    AST 22 12/29/2020    ALT 19 12/29/2020    ALK 41 12/29/2020       Lab Results   Component Value Date    IRON 96 05/13/2020    TIBC 389 05/13/2020    TRANSFERRIN 278 05/13/2020    FERRITIN 142 02/16/2019       Lab Results   Component Value Date    TSH 0.784 10/06/2020        Ref. Range 02/16/2019 07:29 05/13/2020 12:43 03/09/2021 14:17   Vitamin B12 (Cobalamin) Latest Ref Range: 180 - 914 pg/mL >1500 (H) 307 330           Imaging:  U/S PELVIS (INCLUDES TRANSVAGINAL EXAM)    Result Date: 03/06/2021  [HST]: POST MENOPAUSAL BLEEDING post menopausal bleeding HISTORY: Postmenopausal bleeding COMPARISON: 11/08/2012 TECHNIQUE : Pelvic ultrasound with additional transvaginal imaging. Initially, transabdominal scanning of the pelvis was performed. Subsequently, for further assessment and improved diagnostic accuracy of the pelvic contents, transvaginal scanning was required. With transabdominal scanning, the uterus, endometrium, and adnexal regions were imaged. However, for further assessment of the pelvic contents, [the endometrial cavity, the myometrium and the adnexal regions, transvaginal imaging was performed. The more detailed transvaginal measurements of the endometrium and ovaries are combined with the  transabdominal measurements and  are presented in the findings.] FINDINGS: The uterus measures 6.7 x 3.0 x 4.1[ cm.] Previously seen anterior fibroid is not demonstrated on today's exam. There are some calcifications in the cervix, measuring up to 2 mm. The endometrial stripe measures [0.2 cm in thickness.] No focal endometrial abnormalities are seen. The right ovary measures [1.4 x 0.8 x 1.2 cm.] No masses are seen. Blood flow is present. The left ovary measures [1.2 x 0.8 x 0.6 cm.] No masses are seen. Blood flow is present. No abnormal fluid collections are identified in the pelvis.    IMPRESSION: 1. No significant findings on today's ultrasound. 2. Previously seen fibroid is not identified. __________________________________________  Signed by: Elpidio Galea Date: 03/06/2021 10:19 AM                Assessment:  #Neutropenia  #B12 borderline  #H/o of breast cancer s/p chemotherapy and radiation completed 08/2019    Jarelyn Bobbi Yount is a very pleasant 50 year old woman with history of cT3N3 left breast invasive ductal carcinoma diagnosed in 2020, ER/PR/HER2+, s/p neoadjuvant chemotherapy with ddAC-THP, left mastectomy and SLN biopsy, and adjuvant radiation therapy completed 08/6965, complicated by CHF, s/p cardiac PVC ablation 03/2020, referred by Medical Oncology Exie Parody, MD for neutropenia.    She has normal baseline CBC prior to therapy for breast cancer in 12/2018 (12/29/18 WBC 4.36, ANC 3.03, Hgb 12, Hct 36, MCV 88, plts 205k).  She had adjuvant radiation, completed XRT 08/2019. Since completing therapy, she has had mild leukopenia (WBC ranging ~2 to 4) and ANC has been fluctuating from borderline low to normal intermittently.  Most recently CBC prior to initial hematology consultation 02/13/21 WBC 2.62, Hgb 12, Hct 35, MCV 87, plts 173k.    She does not have recurrent infections other than port site infection 11/2019.   We discussed the most common causes of mild chronic leukopenia/neutropenia in adults  are constitutional neutropenia (eg, benign ethnic neutropenia), autoimmune neutropenia and drug-induced neutropenia. Additional causes include nutritional deficiencies, rheumatologic disorders, chronic infections such as Hepatitis and HIV. She has joint aches but attributes to letrozole. Will screen as below for nutritional, infectious or autoimmune etiology. No medication culprits or supplements.     However, discussed most likely etiology for mild leukopenia and neutropenia after chemoradiation is slow bone marrow recovery.   No other cytopenias, B-sx, splenomegaly or adenopathy to suggest hematologic malignancy.   As long as ANC remains acceptable ([place] >1000) and she does not have frequent recurrent infections then suspect this maybe monitored and no intervention is needed. Should she encounter worsening decline in blood counts or recurrent infections, then would like to evaluate further (ie. consider bone marrow biopsy to evaluate for MDS, etc).        Recommendations:  -CBC, CMP, B12, folate, Cu, Hep B, C, HIV screen, immunoglobulin, Yolanda Bowers today  -continue to monitor CBCd as ANC remains stable >1000  -consider bone marrow biopsy if progressive neutropenia or additional cytopenias arise  -Return to clinic for provider visit in 3 months with labs prior to visit (CBC with diff)    It was a pleasure seeing Yolanda Bowers in clinic today. All of these issues were reviewed in detail with Yolanda Bowers and all questions were answered satisfactorily. We will see Yolanda Bowers back in 3 months or sooner if needed. She will call if any problems develop in the interim, particularly if she has increasing discomfort, or new or worsening symptoms. Please call our clinic with any questions or concerns [phone].  I spent a total of 45 minutes today with patient for face-to-face time as well as non-face-to-face time including review of laboratory and imaging results, ordering tests, documentation, counseling and care coordination.  When all questions  were answered to their satisfaction we concluded our visit.       Daniel Nones, MD, 03/09/2021 1:04 PM      Cc: Margreta Journey, MD  Patient Care Team:  Margreta Journey, MD as PCP - General (Family Practice)  Kittie Plater, MD as Oncologist (Medical Oncology)  Thereasa Parkin, MD as Consulting Physician (Psychiatry)  Marchelle Folks, MD as Consulting Physician (Radiation Oncology)      Addendum:  CBC shows WBC improving (3.32), ANC normalized (2.18), otherwise normal. P smear unremarkable.  B12 lower end of normal. Could add additional B12 supplement.  Hep B, C, HIV negative.  Yolanda Bowers negative. Immunoglobulins normal.       Ref. Range 03/09/2021 14:17   WBC Latest Ref Range: 4.3 - 10.0 10*3/uL 3.32 (L)   RBC Latest Ref Range: 3.80 - 5.00 10*6/uL 4.21   Hemoglobin Latest Ref Range: 11.5 - 15.5 g/dL 12.5   Hematocrit Latest Ref Range: 36.0 - 45.0 % 38   MCV Latest Ref Range: 81 - 98 fL 90   MCH Latest Ref Range: 27.3 - 33.6 pg 29.7   MCHC Latest Ref Range: 32.2 - 36.5 g/dL 33.2   Platelet Count Latest Ref Range: 150 - 400 10*3/uL 191   RDW-CV Latest Ref Range: 11.6 - 14.4 % 12.8   % Neutrophils Latest Units: % 66   % Lymphocytes Latest Units: % 25   % Monocytes Latest Units: % 6   % Eosinophils Latest Units: % 2   % Basophils Latest Units: % 1   % Immature Granulocytes Latest Units: % 0   Neutrophils Latest Ref Range: 1.80 - 7.00 10*3/uL 2.18   Absolute Lymphocyte Count Latest Ref Range: 1.00 - 4.80 10*3/uL 0.82 (L)   Monocytes Latest Ref Range: 0.00 - 0.80 10*3/uL 0.21   Absolute Eosinophil Count Latest Ref Range: 0.00 - 0.50 10*3/uL 0.08   Basophils Latest Ref Range: 0.00 - 0.20 10*3/uL 0.02   Immature Granulocytes Latest Ref Range: 0.00 - 0.05 10*3/uL 0.01   WBC Morphology Unknown See Diff   RBC Morphology Unknown See RBC data   Platelet Morphology Unknown See PLT count   Smear Scan Unknown No significant WBC morphological abnormalities.   Folate, SRM Latest Ref Range: >5.8 ng/mL >22.0   Vitamin B12 (Cobalamin)  Latest Ref Range: 180 - 914 pg/mL 330     Results for SHIREEN, RAYBURN (MRN [id number]) as of 03/10/2021 19:09   Ref. Range 03/09/2021 14:17   Hepatitis B S Ag w/Rflx PCR Latest Ref Range: NREAC  Nonreactive   Hepatitis B Surface Ab Unknown Reactive   Hepatitis B Surf Antibody Intl Units Latest Units: m[IU]/mL >1000.00   Hepatitis B Surface Antibody Interp Unknown Antibody to the hepatitis B surface antigen was detected. This antibody arises from either vaccin...   Hepatitis B Core Ab Latest Ref Range: NREAC  Nonreactive   Hepatitis B Core Antibody Interp Unknown Antibody to the Hepatitis B core antigen (anti-HBc) was not detected.   Hepatitis C Antibody w/Rflx PCR Latest Ref Range: NREAC  Nonreactive   Hepatitis C Antibody Interpretation Unknown No evidence of an...   HIV Antigen and Antibody Rslt Latest Ref Range: NREAC  Nonreactive   HIV Ag And Ab Result Interp Unknown Nonreactive. No evidence  of infection with HIV-1 or HIV-2.   Results for HADAR, ELGERSMA (MRN [id number]) as of 03/10/2021 19:09   Ref. Range 03/09/2021 14:17   Immunoglobulin G Latest Ref Range: 610 - 1,616 mg/dL 877   Immunoglobulin A Latest Ref Range: 84 - 499 mg/dL 96   Immunoglobulin M Latest Ref Range: 40 - 350 mg/dL 69   Anti Nuclear Antibody Latest Ref Range: NRN  Negative   Yolanda Bowers Pattern Unknown None   Hep2 Cytoplasmic Pattern Latest Ref Range: NRNIFA  Negative by IFA.   Yolanda Bowers Screen By Multiplex Latest Ref Range: NRN  Negative   Yolanda Bowers Interpretation Comment 1 Unknown Screen negative by IFA and multiplex.   Yolanda Bowers Panel Order Unknown Yolanda Bowers Comprehensive Plus Panel   Anti DFS70 Reflex Unknown [place]     Daniel Nones, MD    Addendum:  Copper wnl.  Results for CELISE, BAZAR (MRN [id number]) as of 03/11/2021 14:55   Ref. Range 03/09/2021 14:17   [place]pper Latest Ref Range: 80 - 155 ug/dL 101     Yolanda Bowers negative.  Results for DIASIA, HENKEN (MRN [id number]) as of 03/11/2021 16:31   Ref. Range 03/09/2021 14:17   Anti Nuclear Antibody Latest Ref Range:  NRN  Negative   Yolanda Bowers Pattern Unknown None   Hep2 Cytoplasmic Pattern Latest Ref Range: NRNIFA  Negative by IFA.   Yolanda Bowers Screen By Multiplex Latest Ref Range: NRN  Negative   Yolanda Bowers Interpretation Comment 1 Unknown Screen negative by IFA and multiplex.   Yolanda Bowers Panel Order Unknown Yolanda Bowers Comprehensive Plus Panel   Anti DFS70 Reflex Unknown Anti DSF70 testing not indicated due to a negative Yolanda Bowers IFA result.

## 2021-03-10 LAB — IMMUNOGLOBULINS A,G,M
Immunoglobulin A: 96 mg/dL (ref 84–499)
Immunoglobulin G: 877 mg/dL (ref 610–1616)
Immunoglobulin M: 69 mg/dL (ref 40–350)

## 2021-03-10 LAB — HIV ANTIGEN AND ANTIBODY SCRN
HIV Antigen and Antibody Interpretation: NONREACTIVE
HIV Antigen and Antibody Result: NONREACTIVE

## 2021-03-11 ENCOUNTER — Encounter (HOSPITAL_BASED_OUTPATIENT_CLINIC_OR_DEPARTMENT_OTHER): Payer: Self-pay

## 2021-03-11 LAB — ANA DIFFERENTIATOR PANEL
Ana Interpretation Comment 1: NEGATIVE
Ana Screen By Multiplex: NEGATIVE
Antibodies to Nuclear Ags by IF (ANA): NEGATIVE
Hep2 Cytoplasmic Pattern: NEGATIVE

## 2021-03-11 LAB — COPPER: Copper: 101 ug/dL (ref 80–155)

## 2021-03-20 ENCOUNTER — Encounter (HOSPITAL_BASED_OUTPATIENT_CLINIC_OR_DEPARTMENT_OTHER): Payer: Self-pay | Admitting: Physician Assistant

## 2021-03-27 ENCOUNTER — Other Ambulatory Visit (HOSPITAL_BASED_OUTPATIENT_CLINIC_OR_DEPARTMENT_OTHER): Payer: Commercial Managed Care - PPO

## 2021-04-06 ENCOUNTER — Encounter (HOSPITAL_BASED_OUTPATIENT_CLINIC_OR_DEPARTMENT_OTHER): Payer: Commercial Managed Care - PPO | Admitting: Medical Oncology

## 2021-04-06 ENCOUNTER — Other Ambulatory Visit (HOSPITAL_BASED_OUTPATIENT_CLINIC_OR_DEPARTMENT_OTHER): Payer: Commercial Managed Care - PPO

## 2021-04-08 ENCOUNTER — Encounter (HOSPITAL_BASED_OUTPATIENT_CLINIC_OR_DEPARTMENT_OTHER): Payer: Commercial Managed Care - PPO | Admitting: Medical Oncology

## 2021-04-08 ENCOUNTER — Other Ambulatory Visit (HOSPITAL_BASED_OUTPATIENT_CLINIC_OR_DEPARTMENT_OTHER): Payer: Commercial Managed Care - PPO

## 2021-04-09 ENCOUNTER — Encounter (HOSPITAL_BASED_OUTPATIENT_CLINIC_OR_DEPARTMENT_OTHER): Payer: Self-pay | Admitting: Medical Oncology

## 2021-04-09 ENCOUNTER — Telehealth (HOSPITAL_BASED_OUTPATIENT_CLINIC_OR_DEPARTMENT_OTHER): Payer: Self-pay

## 2021-04-09 DIAGNOSIS — C50412 Malignant neoplasm of upper-outer quadrant of left female breast: Secondary | ICD-10-CM

## 2021-04-09 NOTE — Telephone Encounter (Signed)
Oncology Telephone Triage Note      Subjective / Visit Information:    Reason for Call: Symptom Management (Pain)    Assessment and Plan:     Call Summary: Pt calls to report some new symptoms:    -Started 3 days ago  -back ache on the L side specifically.  -Spine sore   -red bruise on back (L side)  -no known injury  -has taken tylenol without relief  -cant get comfortable sitting or laying  -ok to walk and ride bike  -has a massage tomorrow 4pm  -has been using her massage chair - no relief  -both hands fall asleep with numbness each night (none in feet)  -has been taking hot baths, showers, using heat pad, stretching - no relief  - possibly been a bit more off balance of recent, tripped and had a fall to her knee 2 weeks ago, bruise has healed, no symptoms remain from this (did not hit knee or back)  -no changes in lifestyle or meds (started potassium supplement)  -denies weakness, bowel or bladder changes  -pt will mychart photo    Plans to get massage tomorrow, has been on letrozole for years doesn't attribute this to letrozole.    RN let pt know RNs will call back with recommendations tomorrow.    Action Taken: Provider Notified    Response: Aware of existing appointments, Aware of clinic contact information, States understanding of plan, Restates plan and No further questions      Additional documentation may exist in Flowsheets or Education Activity

## 2021-04-15 ENCOUNTER — Ambulatory Visit (HOSPITAL_BASED_OUTPATIENT_CLINIC_OR_DEPARTMENT_OTHER): Payer: Commercial Managed Care - PPO

## 2021-04-15 ENCOUNTER — Ambulatory Visit: Payer: Commercial Managed Care - PPO | Attending: Medical Oncology | Admitting: Medical Oncology

## 2021-04-15 DIAGNOSIS — Z79811 Long term (current) use of aromatase inhibitors: Secondary | ICD-10-CM | POA: Insufficient documentation

## 2021-04-15 DIAGNOSIS — Z170 Estrogen receptor positive status [ER+]: Secondary | ICD-10-CM

## 2021-04-15 DIAGNOSIS — C50412 Malignant neoplasm of upper-outer quadrant of left female breast: Secondary | ICD-10-CM

## 2021-04-15 DIAGNOSIS — Z9012 Acquired absence of left breast and nipple: Secondary | ICD-10-CM | POA: Insufficient documentation

## 2021-04-15 LAB — COMPREHENSIVE METABOLIC PANEL
ALT (GPT): 21 U/L (ref 7–33)
AST (GOT): 24 U/L (ref 9–38)
Albumin: 4.4 g/dL (ref 3.5–5.2)
Alkaline Phosphatase (Total): 40 U/L (ref 34–121)
Anion Gap: 4 (ref 4–12)
Bilirubin (Total): 0.6 mg/dL (ref 0.2–1.3)
Calcium: 9.7 mg/dL (ref 8.9–10.2)
Carbon Dioxide, Total: 30 meq/L (ref 22–32)
Chloride: 104 meq/L (ref 98–108)
Creatinine: 0.86 mg/dL (ref 0.38–1.02)
Glucose: 88 mg/dL (ref 62–125)
Potassium: 4.6 meq/L (ref 3.6–5.2)
Protein (Total): 6.9 g/dL (ref 6.0–8.2)
Sodium: 138 meq/L (ref 135–145)
Urea Nitrogen: 13 mg/dL (ref 8–21)
eGFR by CKD-EPI 2021: >60 mL/min/{1.73_m2} (ref >59)

## 2021-04-15 LAB — CBC, DIFF
% Basophils: 1 %
% Eosinophils: 3 %
% Immature Granulocytes: 0 %
% Lymphocytes: 32 %
% Monocytes: 10 %
% Neutrophils: 54 %
Absolute Eosinophil Count: 0.07 10*3/uL (ref 0.00–0.50)
Absolute Lymphocyte Count: 0.85 10*3/uL — ABNORMAL LOW (ref 1.00–4.80)
Basophils: 0.02 10*3/uL (ref 0.00–0.20)
Hematocrit: 37 % (ref 36.0–45.0)
Hemoglobin: 12.4 g/dL (ref 11.5–15.5)
Immature Granulocytes: 0.01 10*3/uL (ref 0.00–0.05)
MCH: 30.3 pg (ref 27.3–33.6)
MCHC: 34 g/dL (ref 32.2–36.5)
MCV: 89 fL (ref 81–98)
Monocytes: 0.26 10*3/uL (ref 0.00–0.80)
Neutrophils: 1.42 10*3/uL — ABNORMAL LOW (ref 1.80–7.00)
Platelet Count: 178 10*3/uL (ref 150–400)
RBC: 4.09 10*6/uL (ref 3.80–5.00)
RDW-CV: 12.4 % (ref 11.6–14.4)
WBC: 2.63 10*3/uL — ABNORMAL LOW (ref 4.3–10.0)

## 2021-04-16 NOTE — Progress Notes (Addendum)
[facility]  BREAST ONCOLOGY CLINIC NOTE    IDENTIFICATION  Yolanda Bowers is a 50 year old female with history of cT3N3 left breast invasive ductal carcinoma, ER/PR/HER2+, s/p neoadjuvant chemotherapy with ddAC-THP, left mastectomy and SLN biopsy, and adjuvant radiation therapy. She had a complete pathologic response at the time of surgery. Presents to clinic today for labs and follow up while on letrozole.     ONCOLOGIC HISTORY     Oncology History Overview Note   10/2018: [name]   11/24/2018: Bilateral mammogram revealed a negative right breast. To the left breast UOQ, there was an 8m mass and a 2.4 cm mass laterally, 17 cm from the nipple. Targeted left breast ultrasound revealed at 3 oclock 8 cm from the nipple, a 21x13x266mmass and an enlarged axillary lymph node measuring 12x11x1569mThere were multiple enlarged lymph nodes  11/27/2018: US-guided biopsy of the breast mass revealed invasive ductal carcinoma, ER/PR+, HER2+, grade 3. There was angiolymphatic invasion seen on biopsy. Biopsy of axillary lymph node positive for carcinoma.   12/08/2018: Bilateral breast MRI revealed right breast at 3 oclock, 2.7cm from the nipple, a 10 x 6 x8mm87mss and at 12 oclock, 11cm from the nipple, a spiculated 16 x 4 x 13mm55ms. Ultrasound recommended.  To the left breast, mass consistent with biopsy proved carcinoma measuring 24 x 20 x 24mm.33mre was also diffuse NME involving mid-outer breast extending over 110x45x69mm. 51min the NME, there was a small spiculated mass at 12 oclock measuring 12mm. M75mple level 1,2 and 3 lymph nodes seen, in addition to at least 3 internal mammary nodes.   12/14/2018: PET CT without evidence of distant metastasis. There are 4mm lung68mdules bilaterally.   12/29/2018: Started on neoadjuvant chemotherapy with ddAC x4 cycles.  03/09/2019: Started on neoadjuvant chemotherapy with Taxol, Herceptin, and  Perjeta.  06/25/2019: Underwent left mastectomy and SLN biopsy with Dr. David ByrImagene Shellergy revealed a single focus of angiolymphatic involvement by carcinoma (< 0.1cm). Negative surgical margins. 0 of 5 lymph nodes positive for carcinoma. ypT0 ypN0.   09/21/2019: Completed XRT.  Started on letrozole 2.'5mg'$  PO daily.     01/29/2020: Given drop in LVEF, last Herceptin/Perjeta infusion was discontinued. Letrozole continued.    Negative genetic panel testing, 11/2018.     Malignant neoplasm of upper-outer quadrant of left breast in female, estrogen receptor positive (HCC)   3/[place]2021 - 01/29/2020 Systemic Therapy    pertuzumab (Perjeta) 840 mg in sodium chloride 0.9 % 250 mL IVPB, Intravenous, 4 of 6 cycles  trastuzumab (Herceptin) 593 mg in sodium chloride 0.9 % 250 mL IVPB, Intravenous, 4 of 6 cycles     11/18/2019 Initial Diagnosis    Malignant neoplasm of upper-outer quadrant of left breast in female, estrogen receptor positive (HCC)     [place]  INTERVAL HISTORY  Yolanda Bowers is overall quite well. She's been hiking, exercising, and throwing pottery. She is getting ready for the school year to start, one son is in college the other 8th grade. 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 This comes and goes but is unsettling.   She continues to note shoulder and hip aches. She had been seeing PT for  her right shoulder pain. She is working with [employer] and does exercises classes 3 days a week: aerobics, strengthening, and dance/yoga. Sometimes, however, after a long work day or in general, she doesn't have the energy level to complete this. She continues to struggle with fatigue.   She feels better physically and emotionally. She eats well, eating salads and incorporating protein in her diet.    She continues to note left breast/arm lymphedema and  tingling- wakes up feeling like arms are asleep, fully resolves. This is unchanged. She continues on letrozole.     ECOG Status: (0) Fully active, able to carry on all predisease performance without restriction     REVIEW OF SYSTEMS  A complete review of systems was conducted and is negative in detail, except as noted in the interval history.    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 Lowella Dell    PR MASTECTOMY SIMPLE COMPLETE Left 06/25/2019    Had radiation        ALLERGIES  Review of patient's allergies indicates:  Allergies   Allergen Reactions    Oxycodone Skin: Itching    Codeine WC:HENIDP/OEUMPNTI    Lisinopril Resp: Cough    Walnut Skin: Rash    Paclitaxel Skin: Hives        MEDICATIONS    Current Outpatient Medications:     acetaminophen 500 MG tablet, Take 1-2 tabs by mouth once daily as needed for pain., Disp: , Rfl:     aspirin 81 MG EC tablet, Take 81 mg by mouth., Disp: , Rfl:     Calcium Citrate-Vitamin D (CITRACAL + D OR), Take 1 tablet by mouth daily., Disp: , Rfl:     cetirizine 10 MG tablet, Take by mouth., Disp: , Rfl:     famotidine 20 MG tablet, Take 1 tablet (20 mg) by mouth daily as needed for indigestion/heartburn., Disp: , Rfl:     letrozole 2.5 MG tablet, Take 1 tablet (2.5 mg) by mouth daily., Disp: 90 tablet, Rfl: 3    metoprolol succinate ER 25 MG 24 hr tablet, Take 1 tablet (25 mg) by mouth daily. Do not chew or crush., Disp: 90 tablet, Rfl: 3    potassium  chloride ER 10 MEQ ER tablet, Take 1 tablet (10 mEq) by mouth daily., Disp: 90 tablet, Rfl: 3    valsartan 40 MG tablet, Take 1 tablet (40 mg) by mouth 2 times a day., Disp: 180 tablet, Rfl: 3    venlafaxine ER 75 MG 24 hr capsule, Take 1 capsule (75 mg) by mouth every morning., Disp: 90 capsule, Rfl: 0     SOCIAL HISTORY:   Social History     Tobacco Use    Smoking status: Never Smoker    Smokeless tobacco: Never Used   Substance Use Topics    Alcohol use: Not Currently    Drug use: Not  Currently        FAMILY HISTORY:    Cancer-related family history includes Ovarian Cancer (age of onset: 69) in her paternal aunt; Skin Cancer in her father and maternal grandfather.     PHYSICAL EXAMINATION   The patient was asked if they would like to have a chaperone in the room for this exam.   The patient declined a chaperone.     VITAL SIGNS: BP 110/73    Pulse (!) 57    Temp 36.8 C (Oral)    Resp 16    Wt 74.4 kg (164 lb 0.4 oz)    SpO2 99%    BMI 26.93 kg/m       GENERAL: Well appearing, well developed female in no acute distress.  HEENT: Normocephalic, atraumatic. Sclerae anicteric. Pupils are equal, round.   NECK: Neck is supple without any appreciable lymphadenopathy or thyromegaly.  LUNGS: Respirations regular and unlabored. Clear to auscultation bilaterally.  CARDIAC: Regular rate and rhythm. No murmurs, gallops, or rubs.   BREASTS: Left breast s/p mastectomy with well healed surgical scars. Right breast s/p reduction. No palpable masses, tenderness, or lesions. Nipple everted without discharge. No appreciable axillary lymphadenopathy bilaterally.  ABDOMEN: Soft, non-tender, non-distended. Normoactive bowel sounds. No hepatosplenomegaly or masses appreciated.  EXTREMITIES: No edema, clubbing, or cyanosis.  SKIN: No visible rash, bruising, or petechiae.  MSK: Without muscle or joint deformities.   NEUROLOGICAL: Alert and oriented. Mental status appropriate.   NUTRITIONAL STATUS: Adequate.      Lab on 04/15/2021    Component Date Value Ref Range Status    WBC 04/15/2021 2.63 (A) 4.3 - 10.0 10*3/uL Final    RBC 04/15/2021 4.09  3.80 - 5.00 10*6/uL Final    Hemoglobin 04/15/2021 12.4  11.5 - 15.5 g/dL Final    Hematocrit 04/15/2021 37  36.0 - 45.0 % Final    MCV 04/15/2021 89  81 - 98 fL Final    MCH 04/15/2021 30.3  27.3 - 33.6 pg Final    MCHC 04/15/2021 34.0  32.2 - 36.5 g/dL Final    Platelet Count 04/15/2021 178  150 - 400 10*3/uL Final    RDW-CV 04/15/2021 12.4  11.6 - 14.4 % Final    % Neutrophils 04/15/2021 54  % Final    % Lymphocytes 04/15/2021 32  % Final    % Monocytes 04/15/2021 10  % Final    % Eosinophils 04/15/2021 3  % Final    % Basophils 04/15/2021 1  % Final    % Immature Granulocytes 04/15/2021 0  % Final    Neutrophils 04/15/2021 1.42 (A) 1.80 - 7.00 10*3/uL Final    Absolute Lymphocyte Count 04/15/2021 0.85 (A) 1.00 - 4.80 10*3/uL Final    Monocytes 04/15/2021 0.26  0.00 - 0.80 10*3/uL Final    Absolute Eosinophil Count 04/15/2021 0.07  0.00 - 0.50 10*3/uL Final    Basophils 04/15/2021 0.02  0.00 - 0.20 10*3/uL Final    Immature Granulocytes 04/15/2021 0.01  0.00 - 0.05 10*3/uL Final    RBC Morphology 04/15/2021 See RBC data   Final    Platelet Morphology 04/15/2021 See PLT count   Final    WBC Morphology 04/15/2021 See Diff   Final    Sodium 04/15/2021 138  135 - 145 meq/L Final    Potassium 04/15/2021 4.6  3.6 - 5.2 meq/L Final    Chloride 04/15/2021 104  98 - 108 meq/L Final    Carbon Dioxide, Total 04/15/2021 30  22 -  32 meq/L Final    Anion Gap 04/15/2021 4  4 - 12 Final    Glucose 04/15/2021 88  62 - 125 mg/dL Final    Urea Nitrogen 04/15/2021 13  8 - 21 mg/dL Final    Creatinine 04/15/2021 0.86  0.38 - 1.02 mg/dL Final    Protein (Total) 04/15/2021 6.9  6.0 - 8.2 g/dL Final    Albumin 04/15/2021 4.4  3.5 - 5.2 g/dL Final    Bilirubin (Total) 04/15/2021 0.6  0.2 - 1.3 mg/dL Final    Calcium 04/15/2021 9.7  8.9 - 10.2 mg/dL Final    AST (GOT) 04/15/2021 24  9 -  38 U/L Final    Alkaline Phosphatase (Total) 04/15/2021 40  34 - 121 U/L Final    ALT (GPT) 04/15/2021 21  7 - 33 U/L Final    eGFR by CKD-EPI 2021 04/15/2021 >60  >59 mL/min/[1.73_m2] Final             ASSESSMENT/PLAN  Yolanda Bowers is a 50 year old female with history of cT3N3 left breast invasive ductal carcinoma, ER/PR/HER2+, s/p neoadjuvant chemotherapy with ddAC-THP, left mastectomy and SLN biopsy, and adjuvant radiation therapy. She had a complete pathologic response at the time of surgery. Presents to clinic today for labs and follow up while on adjuvant letrozole.    1. Malignant neoplasm of upper-outer quadrant of left breast in female, estrogen receptor positive ([place])  Yolanda Bowers is doing well overall today from a breast cancer perspective without evidence of recurrence based on review of systems and clinical exam. R breast mammogram due 06/2021.  She will continue with cancer rehab to discuss her fatigue, lymphedema, arthralgias, etc as it relates to her breast cancer treatment.   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 Well with working to    build back strength and stamina.  We discussed checking imaging to r/o metastatic spread as a cause of her symptoms, and will arrange plain films and  MRI.  Breast Cancer:   Yolanda Bowers states she is tolerating her letrozole well, and we'll continue, f/u estradiol to confirm she's persistently in menopause.  -Surveillance Imaging/Mammogram/MRI: right mammogram due 03/2021       Bone Health:  -Dexa: scan on 3/21 showing osteopenia, due again 10/2021     Psych:    -Patient working with counselor [facility], continued to encourage reaching out if things worsen.     Survivorship:  - Counseled on regular exercise and dietary measures to reduce risk of recurrence.    DISPOSITION  Patient to return to clinic in 3-4 months for follow up and surveillance. Patient encouraged to  contact clinic in the interim with any questions or concerns.    We reviewed at the visit that return of her malignancy was unlikely given the response to chemo. F/u imaging is indeed negative. She also had a tiny pulm nodule which resolved, and we'll f/u that as well, for one final measurement. Hopefully she can build stamina, and be back at baseline.

## 2021-04-22 ENCOUNTER — Ambulatory Visit
Admission: RE | Admit: 2021-04-22 | Discharge: 2021-04-22 | Disposition: A | Discharge status: Home / Self Care | Payer: Commercial Managed Care - PPO | Attending: Diagnostic Radiology | Admitting: Diagnostic Radiology

## 2021-04-22 ENCOUNTER — Ambulatory Visit (HOSPITAL_BASED_OUTPATIENT_CLINIC_OR_DEPARTMENT_OTHER)
Admit: 2021-04-22 | Discharge: 2021-04-22 | Disposition: A | Discharge status: Home / Self Care | Payer: Commercial Managed Care - PPO | Source: Home / Self Care

## 2021-04-22 DIAGNOSIS — C50412 Malignant neoplasm of upper-outer quadrant of left female breast: Secondary | ICD-10-CM

## 2021-04-22 DIAGNOSIS — Z170 Estrogen receptor positive status [ER+]: Secondary | ICD-10-CM

## 2021-04-22 MED ORDER — GADOTERIDOL 279.3 MG/ML IV SOLN
15.0000 mL | Freq: Once | INTRAVENOUS | Status: AC | PRN
Start: 2021-04-22 — End: 2021-04-22
  Administered 2021-04-22: 7.5 mmol via INTRAVENOUS

## 2021-04-24 ENCOUNTER — Encounter (HOSPITAL_BASED_OUTPATIENT_CLINIC_OR_DEPARTMENT_OTHER): Payer: Self-pay

## 2021-04-29 ENCOUNTER — Encounter (HOSPITAL_BASED_OUTPATIENT_CLINIC_OR_DEPARTMENT_OTHER): Payer: Commercial Managed Care - PPO

## 2021-05-04 ENCOUNTER — Encounter (HOSPITAL_BASED_OUTPATIENT_CLINIC_OR_DEPARTMENT_OTHER): Payer: Self-pay | Admitting: Medical Oncology

## 2021-05-05 ENCOUNTER — Other Ambulatory Visit (HOSPITAL_BASED_OUTPATIENT_CLINIC_OR_DEPARTMENT_OTHER): Payer: Self-pay | Admitting: Nurse Practitioner

## 2021-05-05 ENCOUNTER — Encounter (HOSPITAL_BASED_OUTPATIENT_CLINIC_OR_DEPARTMENT_OTHER): Payer: Self-pay

## 2021-05-05 DIAGNOSIS — C50412 Malignant neoplasm of upper-outer quadrant of left female breast: Secondary | ICD-10-CM

## 2021-05-05 DIAGNOSIS — Z170 Estrogen receptor positive status [ER+]: Secondary | ICD-10-CM

## 2021-05-07 ENCOUNTER — Encounter (HOSPITAL_BASED_OUTPATIENT_CLINIC_OR_DEPARTMENT_OTHER): Payer: Commercial Managed Care - PPO

## 2021-05-13 ENCOUNTER — Other Ambulatory Visit (HOSPITAL_BASED_OUTPATIENT_CLINIC_OR_DEPARTMENT_OTHER): Payer: Self-pay

## 2021-05-15 ENCOUNTER — Encounter (HOSPITAL_BASED_OUTPATIENT_CLINIC_OR_DEPARTMENT_OTHER): Payer: Self-pay | Admitting: Radiation Oncology

## 2021-05-20 ENCOUNTER — Ambulatory Visit
Payer: Commercial Managed Care - PPO | Attending: Student in an Organized Health Care Education/Training Program | Admitting: Student in an Organized Health Care Education/Training Program

## 2021-05-20 VITALS — BP 110/73 | HR 71 | Temp 98.1°F | Resp 16 | Wt 161.1 lb

## 2021-05-20 DIAGNOSIS — I890 Lymphedema, not elsewhere classified: Secondary | ICD-10-CM | POA: Insufficient documentation

## 2021-05-20 DIAGNOSIS — C773 Secondary and unspecified malignant neoplasm of axilla and upper limb lymph nodes: Secondary | ICD-10-CM | POA: Insufficient documentation

## 2021-05-20 DIAGNOSIS — R530 Neoplastic (malignant) related fatigue: Secondary | ICD-10-CM

## 2021-05-20 DIAGNOSIS — Z170 Estrogen receptor positive status [ER+]: Secondary | ICD-10-CM | POA: Insufficient documentation

## 2021-05-20 DIAGNOSIS — M2550 Pain in unspecified joint: Secondary | ICD-10-CM

## 2021-05-20 DIAGNOSIS — C50812 Malignant neoplasm of overlapping sites of left female breast: Secondary | ICD-10-CM | POA: Insufficient documentation

## 2021-05-20 DIAGNOSIS — M7062 Trochanteric bursitis, left hip: Secondary | ICD-10-CM

## 2021-05-20 DIAGNOSIS — Z79811 Long term (current) use of aromatase inhibitors: Secondary | ICD-10-CM | POA: Insufficient documentation

## 2021-05-20 DIAGNOSIS — T451X5A Adverse effect of antineoplastic and immunosuppressive drugs, initial encounter: Secondary | ICD-10-CM

## 2021-05-20 MED ORDER — METHOCARBAMOL 500 MG OR TABS
500.0000 mg | ORAL_TABLET | Freq: Every evening | ORAL | 0 refills | Status: DC | PRN
Start: 2021-05-20 — End: 2021-07-02

## 2021-05-20 NOTE — Progress Notes (Signed)
Department of Physical Medicine & Rehabilitation  Cancer Rehabilitation  Name: Yolanda Bowers  DOB: 08-13-1971  MRN: [id number]      ASSESSMENT:  50 year old R handed woman with history of L breast cancer (ER/PR/HER2+) s/p ddAC-THP, L mastectomy and SLNbiopsy 06/25/2019, XRT completed 09/21/2019, now on letrozole who presents with joint pain and stiffness, likely aromatase inhibitor associated arthralgias.     PLAN:  Aromatase inhibitor associated arthralgias; fatigue  L gluteal pain, possibly SI joint mediated  underlying component of joint hypermobility  - discussed contributing factors and management to date  - will send patient hip and core strengthening exercises  - continue walking and stationary bike for aerobic activity; reviewed ACSM recommendations of >137mn mod intensity aerobic activity per week  - reviewed 04/22/21 hip xray and spine MRI results  - will send patient information on pacing and energy conservation strategies  - trial sacroiliac belt for prolonged standing/walking; she reports she used something similar during pregnancy  - start nightly methocarbamol 5050m as needed for muscle spasms; reviewed potential side effects such as drowsiness, nausea, confusion, stomach discomfort    R shoulder pain 2/2 adhesive capsulitis - improved  L arm lymphedema  - continue home exercises    Bilateral carpal tunnel - resolved  - had EMG done at outside institution  - improved with changes in sleeping posutre    Follow up in 2-3 months, sooner if needed    HaWerner LeanMD  >45 minutes were spent during the encounter, inclusive of a physical examination, chart review, documentation, counseling on the above plan, care coordination.     ---------------------------------------------------------------------------------------------------------------------  SUBJECTIVE  CC: Hip Pain      HPI:    Initial appt 02/13/21  The patient is a [age]ear old R handed woman with history of L breast cancer (ER/PR/HER2+) s/p  ddAC-THP, L mastectomy and SLNbiopsy 06/25/2019, XRT completed 09/21/2019, now on letrozole who presents for initial visit with rehabilitation medicine. Prior to her cancer diagnosis, she reports being very active - played volleyball in college, hikes, exercised regularly.   She has worked as a tePharmacist, hospitalhroughout treatment, but has had significant fatigue in throughout the past school year. Of note, she reports feeling "knocked out" by the 3rd cycle of chemotherapy and had to stop taking exercise classes.   She currently experiences joint stiffness and pain in the mornings. This involves the low back, knees, hips. She has had PT for the shoulder due to hx of adhesive capsulitis that is improving. Once she is moving around and walking, stiffness improves. She denies any joint swelling, buckling, locking. Denies radicular pain in the extremities.  Back pain is located around the lumbosacral junction, gluteal musculature, and posterior hip. Hip ROM has been preserved.   Knee pain and stiffness is around the anterior knee/patella.    She also has had symptoms of tingling in the hands for multiple months, for which she had an EMG and was told she has carpal tunne.   She takes intermittent tylenol. With aerobic activity, she experiences dyspnea on exertion which improves with rest.   Functional history: independent in mobility and ADLs    Follow up 05/20/21  Yolanda Bowers reports fatigue and joint/muscle pain has worsened since the school year started. She was much more able to pace her activities and day during the summer, with rest breaks and naps. Now that she is back to work, she describes feeling exhausted in the evening. Joint pain improved with home exercises and activity  modification over the summer, but have now worsened. However, some shoulder/neck muscle aches and myofasical pain improved after deep tissue massages. Hand paresthesias have resolved with change in sleeping posture. Shw was able to do hip girdle strengthening  exercises and start riding her stationary bike with improvement in hip strength; however navigating multiple flights of stairs is still challenging. L posterior hip/glute continues to be sore. We found a stretch/exercise during the visit to help target this area. We reviewed results and images of her hip and lumbar spine from this past summer.     Review of Systems  General: weight gain (+)  Bones/Joints: joint pain (+), joint swelling, (-)  Neuro/psych: weakness (-), difficulty walking (-), loss of balance (-), fainting spells (-), fatigue (+), numbness (-)      HISTORY  Past Medical History:     Past Medical History:   Diagnosis Date    Chemotherapy adverse reaction 06/25/2019    Allergic rhinitis due to allergen     Anemia     Anxiety     Breast cancer ([place])     Burn     Cancer [facility])     Congestive heart failure ([place])     Headache      Past Surgical History:     Past Surgical History:   Procedure Laterality Date    BREAST BIOPSY Right 11/2018    2 core bx      BREAST RECONSTRUCTION Right 06/25/2019    reduction - Dr. Lowella Dell    PR MASTECTOMY SIMPLE COMPLETE Left 06/25/2019    Had radiation       Review of patient's allergies indicates:  Allergies   Allergen Reactions    Oxycodone Skin: Itching    Codeine IO:NGEXBM/[id number]    Lisinopril Resp: Cough    Walnut Skin: Rash    Paclitaxel Skin: Hives       Current Outpatient Medications   Medication Instructions    acetaminophen 500 MG tablet Take 1-2 tabs by mouth once daily as needed for pain.    aspirin 81 mg, Oral    Calcium Citrate-Vitamin D (CITRACAL + D OR) 1 tablet, Oral, Daily    cetirizine 10 MG tablet Oral    famotidine (PEPCID) 20 mg, Oral, Daily PRN    gabapentin 100 MG capsule Take 137m qHS for night sweats. Can increase to 209mif no effect after 5-7 days. Can increase to 30016ms tolerated.    letrozole ([facility].5 mg, Oral, Daily    metoprolol succinate ER (TOPROL XL) 25 mg, Oral, Daily, Do not chew or crush.    valsartan  (DIOVAN) 40 mg, Oral, 2 times daily    venlafaxine ER (EFFEXOR XR) 75 mg, Oral, Every morning       Family History     Problem (# of Occurrences) Relation (Name,Age of Onset)    Alcohol/Drug (1) Maternal Grandfather    Diabetes (1) Mother    Hearing Loss (2) Mother, Father    Obesity (2) Mother, Maternal Grandfather    Ovarian Cancer (1) Paternal Aunt ([phone]  Skin Cancer (2) Father, Maternal Grandfather          Social History     Tobacco Use    Smoking status: Never Smoker    Smokeless tobacco: Never Used   Substance Use Topics    Alcohol use: Not Currently    Drug use: Not Currently       RESULTS:     04/22/21 MRI complete spine per  radiology report  IMPRESSION  1.  Focal enhancement within the lateral aspect of the L3-L4 intervertebral disc is thought to represent discogenic pathology, attention on follow-up recommended.  2.  Small focal rounded enhancement in the L1 vertebral body is thought to reflect a benign etiology however attention on follow-up recommended.  3.  Multilevel cervical spondylosis worse at C5-C6 with mild to moderate canal narrowing.    04/22/21 XR hip per radiology report  FINDINGS AND IMPRESSION  There is mild degeneration at the lumbosacral junction. There is degeneration at the pubic symphysis. No other bone, joint, or soft tissue abnormality is seen. There are no fractures or dislocations     PHYSICAL EXAM:  Vitals: BP 110/73    Pulse 71    Temp 36.7 C (Oral)    Resp 16    Wt 73.1 kg (161 lb 1.6 oz)    SpO2 97%    BMI 26.45 kg/m   - Gen: Alert and able to follow simple commands, no acute distress  - Psych: Normal mood and affect, responds appropriately to commands  - HEENT: Anicteric, face symmetric  - CV:  no varicosities noted  - GI/Abd: Nondistended  - Resp: Breathing unlabored, symmetric chest wall movements  - Extr: No clubbing/cyanosis, L arm lymphedema  - Skin: Intact skin, non-erythematous, no rashes or breakdown appreciated on extremities    Neuro   - Mental Status: Alert  and able to follow simple commands  - Language/Speech: Fluency intact, comprehensible speech  - Movement: No tremor, chorea, dystonia or asterixis      Motor strength:   Right Left   Sh Abduction 5 5   Elbow Flexion 5 5   Elbow Extension 5 5   Wrist Extension 5 5   Finger Flexion 5 5   Finger Abduction 5 5   Hip Abduction 5 5   Hip Adduction 5 5   Hip Flexion 5 5   Knee Extension 5 5   Knee Flexion 5 5   Ankle Dorsiflex 5 5   Ankle Plantarflex 5 5       - Gait: non-antalgic with normal walking and tandem gait, able to heel and toe walk     MSK Lumbosacral Spine (R/L)  - Stability: No gross instability  - Inspection: Atrophy (-)/(-), Effusion (-)/(-), no visible surgical scars  - Alignment: Spine is midline, with no evidence of scoliosis  - Tender to palpation: Spinous Processes (-), Paraspinals (-)/(-)  - ROM: Flexion, extension, left oblique extension, right oblique extension full   Some low back pain with oblique extension  Special Tests:   SLR (-)/(-)    MSK Hip (R/L)  - Inspection: Atrophy (-)/(-), Effusion (-)/(-)  - Alignment: Normal  - Tender to palpation: Anterior Hip by hip flexors (-)/(-), Adductors (-)/(-), PSIS (-)/(+), Sciatic Notch (-)/(-), Glute musculature (+)/(+), Greater Trochanter (-)/(+), IT Band (-)/(-)  - ROM while lying supine and hip flexed 90 degrees:  Internal rotation 50  External rotation 80  Knees hyperextend 5-10 degrees bilaterally  Special tests: Log Roll (-)/(-), Active SLR (-)/(-), Scour (-)/(+), Patrick's/FABER (+ reproduces posterior hip/low back pain on the L),FADDIR (-)/(-), Sacroiliac Rocking (-)/(-)

## 2021-05-20 NOTE — Patient Instructions (Signed)
Try using a sacroiliac belt (serola belt) for prolonged standing/walking to see if helps with some of the hip/glute discomfort    I will send you 1-2 home exercises for glute strengthening    Methocarbamol 500mg  as needed at night, Monitor for confusion, drowsiness, morning grogginess, nausea, stomach upset.

## 2021-05-21 ENCOUNTER — Encounter (HOSPITAL_BASED_OUTPATIENT_CLINIC_OR_DEPARTMENT_OTHER): Payer: Self-pay | Admitting: Student in an Organized Health Care Education/Training Program

## 2021-06-09 ENCOUNTER — Other Ambulatory Visit (HOSPITAL_BASED_OUTPATIENT_CLINIC_OR_DEPARTMENT_OTHER): Payer: Commercial Managed Care - PPO

## 2021-06-10 ENCOUNTER — Other Ambulatory Visit (HOSPITAL_BASED_OUTPATIENT_CLINIC_OR_DEPARTMENT_OTHER): Payer: Commercial Managed Care - PPO

## 2021-06-10 ENCOUNTER — Encounter (HOSPITAL_BASED_OUTPATIENT_CLINIC_OR_DEPARTMENT_OTHER): Payer: Commercial Managed Care - PPO | Admitting: Internal Medicine

## 2021-06-10 ENCOUNTER — Ambulatory Visit (HOSPITAL_BASED_OUTPATIENT_CLINIC_OR_DEPARTMENT_OTHER): Payer: Commercial Managed Care - PPO | Admitting: Internal Medicine

## 2021-06-10 ENCOUNTER — Ambulatory Visit: Payer: Commercial Managed Care - PPO | Attending: Internal Medicine

## 2021-06-10 DIAGNOSIS — C773 Secondary and unspecified malignant neoplasm of axilla and upper limb lymph nodes: Secondary | ICD-10-CM | POA: Insufficient documentation

## 2021-06-10 DIAGNOSIS — D709 Neutropenia, unspecified: Secondary | ICD-10-CM

## 2021-06-10 DIAGNOSIS — C50812 Malignant neoplasm of overlapping sites of left female breast: Secondary | ICD-10-CM | POA: Insufficient documentation

## 2021-06-10 DIAGNOSIS — Z79811 Long term (current) use of aromatase inhibitors: Secondary | ICD-10-CM | POA: Insufficient documentation

## 2021-06-10 DIAGNOSIS — Z170 Estrogen receptor positive status [ER+]: Secondary | ICD-10-CM | POA: Insufficient documentation

## 2021-06-10 LAB — CBC, DIFF
% Basophils: 1 %
% Eosinophils: 2 %
% Immature Granulocytes: 0 %
% Lymphocytes: 31 %
% Monocytes: 9 %
% Neutrophils: 57 %
Absolute Eosinophil Count: 0.07 10*3/uL (ref 0.00–0.50)
Absolute Lymphocyte Count: 1.11 10*3/uL (ref 1.00–4.80)
Basophils: 0.02 10*3/uL (ref 0.00–0.20)
Hematocrit: 36 % (ref 36.0–45.0)
Hemoglobin: 12.2 g/dL (ref 11.5–15.5)
Immature Granulocytes: 0.01 10*3/uL (ref 0.00–0.05)
MCH: 30.3 pg (ref 27.3–33.6)
MCHC: 34.1 g/dL (ref 32.2–36.5)
MCV: 89 fL (ref 81–98)
Monocytes: 0.3 10*3/uL (ref 0.00–0.80)
Neutrophils: 2.03 10*3/uL (ref 1.80–7.00)
Platelet Count: 184 10*3/uL (ref 150–400)
RBC: 4.03 10*6/uL (ref 3.80–5.00)
RDW-CV: 12.4 % (ref 11.0–14.5)
WBC: 3.54 10*3/uL — ABNORMAL LOW (ref 4.3–10.0)

## 2021-06-10 NOTE — Progress Notes (Signed)
[place]      Hematology Clinic Follow-up Visit    Date of Service: 06/10/2021    PCP: Leander Rams, MD    Diagnosis:  #Neutropenia, resolved   -normal baseline CBC prior to therapy for breast cancer in 12/2018 (12/29/18 WBC 4.36, ANC 3.03, Hgb 12, Hct 36, MCV 88, plts 205k).   -adjuvant radiation, completed XRT 08/2019   -Since completing therapy, mild leukopenia (WBC ranging ~2 to 4) and ANC has been fluctuating from borderline low to normal intermittently.   -port site infection 11/2019.    -02/13/21 WBC 2.62, Hgb 12, Hct 35, MCV 87, plts 173k.   -heme evaluation 03/09/21 WBC 3.32, ANC 2.18 normalized, otherwise normal. P smear unremarkable. B12 lower end normal 330. Cu wnl. Hep B, C, HIV negative. ANA negative. Immunoglobulins normal.   -04/15/21 WBC 2.63, ANC 1.42   -today 06/10/21 WBC 3.54, ANC 2.03 normalized        History of Present Illness:  Yolanda Bowers is a very pleasant 50 year old woman with history of cT3N3 left breast invasive ductal carcinoma diagnosed in 2020, ER/PR/HER2+, s/p neoadjuvant chemotherapy with ddAC-THP, left mastectomy and SLN biopsy, and adjuvant radiation therapy completed 02/3709, complicated by CHF, s/p cardiac PVC ablation 03/2020, referred by Medical Oncology Exie Parody, MD for neutropenia.    She has normal baseline CBC prior to therapy for breast cancer in 12/2018 (12/29/18 WBC 4.36, ANC 3.03, Hgb 12, Hct 36, MCV 88, plts 205k).  She was in general health until she self-identified a L axillary breast mass 10/2018. She was diagnosed with cT3N3 left breast invasive ductal carcinoma diagnosed in 2020, ER/PR/HER2+, treated with neoadjuvant ddAC x 4 and 4 of 6 cycles THP completed 05/2019. She had G-CSF with chemotherapy and ANC ranged 0.9-3.1 during therapy.  She underwent L mastectomy and SLN biopsy 06/2019 (Dr. Imagene Sheller). Pathology revealed a single focus of angiolymphatic involvement by carcinoma (<0.1cm). Negative  surgical margins and 0 of 5 lymph nodes positive for carcinoma. ypT0 ypN0.  She had adjuvant radiation, completed XRT 08/2019. ANC was low 1.42 following radiation 09/2019.  Since completing therapy, she has had mild leukopenia (WBC ranging ~2 to 4) and ANC has been fluctuating from borderline low to normal intermittently.  Most recently CBC prior to initial hematology consultation 02/13/21 WBC 2.62, Hgb 12, Hct 35, MCV 87, plts 173k.    Chemotherapy was complicated by systolic heart failure, EF 45%.She was admitted 03/2020 for increased fatigue and Holter monitor showed high burden PVCs and several episodes of VT. She was initiated on metoprolol and amiodarone. She had PVC ablation and amiodarone was stopped. She remains on metoprolol and valsartan.  She has continued fatigue, especially with exertion.    She had a port site infection 11/24/20 at site of port removal 1 month prior. She was treated with 10 day course of Bactrim.   She denies other infections. No chronic infections in adulthood.   She has joint aches in knees and hips that she attributes to the start of letrozole.   No new medications in last 6 months. Denies supplements.    No personnel history of autoimmune conditions.  Mother has RA.     Denies weight loss, adenopathy. She has hot flashes. LMP May 2021.  She had episode of hematuria after a long snow shoe hike. She was evaluated at [facility] PCP> UA 03/05/21 showed 1+ hematuria. She had pelvic US 03/06/21 which was normal. Hematuria has since resolved.  09/2020 TSH wnl.   B12 borderline low in past 05/13/20 307. She does not take b12 supplement.      Interval History:   She returns for followup of neutropenia.   She was last seen 03/09/21.  She has returned to work as Pharmacist, hospital full time.  Neutropenia resolved today 06/10/21 WBC 3.54, ANC 2.03 normalized.    Review of Systems:   A complete ROS is negative with with the exception of the pertinent positives which are noted in the HPI.          Medications:  Current Outpatient Medications   Medication Sig Dispense Refill    acetaminophen 500 MG tablet Take 1-2 tabs by mouth once daily as needed for pain.      aspirin 81 MG EC tablet Take 81 mg by mouth.      Calcium Citrate-Vitamin D (CITRACAL + D OR) Take 1 tablet by mouth daily.      cetirizine 10 MG tablet Take by mouth.      famotidine 20 MG tablet Take 1 tablet (20 mg) by mouth daily as needed for indigestion/heartburn.      letrozole 2.5 MG tablet Take 1 tablet (2.5 mg) by mouth daily. 90 tablet 3    methocarbamol 500 MG tablet Take 1 tablet (500 mg) by mouth.      methocarbamol 500 MG tablet Take 1 tablet (500 mg) by mouth at bedtime as needed for muscle spasms. 15 tablet 0    metoprolol succinate ER 25 MG 24 hr tablet Take 1 tablet (25 mg) by mouth daily. Do not chew or crush. 90 tablet 3    potassium chloride ER 10 MEQ ER tablet Take 1 tablet (10 mEq) by mouth daily. 90 tablet 3    valsartan 40 MG tablet Take 1 tablet (40 mg) by mouth 2 times a day. 180 tablet 3    venlafaxine ER 75 MG 24 hr capsule Take 1 capsule (75 mg) by mouth every morning. 90 capsule 0     No current facility-administered medications for this visit.       Allergies:  Review of patient's allergies indicates:  Allergies   Allergen Reactions    Oxycodone Skin: Itching    Codeine RX:[id number]    Lisinopril Resp: Cough    Walnut Skin: Rash    Paclitaxel Skin: Hives         Please see previous Team Notes for further details of the past medical history, family history, and social history. Reviewed and unchanged.               Physical Examination     VS: BP 120/77    Pulse (!) 55    Temp 36.5 C (Oral)    Resp 16    Wt 74.4 kg (164 lb 0.4 oz)    SpO2 99%    BMI 26.93 kg/m     Gen:  well appearing, in no apparent distress   HEENT:  NCAT, conjunctivae nonicteric,PER, mask due to covid19 pandemic  Neck:  supple  Chest: breathing comfortably on RA  Neuro: A&Ox3, grossly nonfocal  Psych: mood and affect  are normal    Skin: exposed skin without rashes  Ext:  warm, well-perfused          Results:  Personally reviewed available laboratory and imaging results.     Lab:   Results for orders placed or performed in visit on 06/10/21   CBC with Diff   Result Value Ref Range  WBC 3.54 (L) 4.3 - 10.0 10*3/uL    RBC 4.03 3.80 - 5.00 10*6/uL    Hemoglobin 12.2 11.5 - 15.5 g/dL    Hematocrit 36 36.0 - 45.0 %    MCV 89 81 - 98 fL    MCH 30.3 27.3 - 33.6 pg    MCHC 34.1 32.2 - 36.5 g/dL    Platelet Count 184 150 - 400 10*3/uL    RDW-CV 12.4 11.0 - 14.5 %    % Neutrophils 57 %    % Lymphocytes 31 %    % Monocytes 9 %    % Eosinophils 2 %    % Basophils 1 %    % Immature Granulocytes 0 %    Neutrophils 2.03 1.80 - 7.00 10*3/uL    Absolute Lymphocyte Count 1.11 1.00 - 4.80 10*3/uL    Monocytes 0.30 0.00 - 0.80 10*3/uL    Absolute Eosinophil Count 0.07 0.00 - 0.50 10*3/uL    Basophils 0.02 0.00 - 0.20 10*3/uL    Immature Granulocytes 0.01 0.00 - 0.05 10*3/uL    RBC Morphology See RBC data     Platelet Morphology See PLT count     WBC Morphology See Diff        ANC:         Ref. Range 03/09/2021 14:17   WBC Latest Ref Range: 4.3 - 10.0 10*3/uL 3.32 (L)   RBC Latest Ref Range: 3.80 - 5.00 10*6/uL 4.21   Hemoglobin Latest Ref Range: 11.5 - 15.5 g/dL 12.5   Hematocrit Latest Ref Range: 36.0 - 45.0 % 38   MCV Latest Ref Range: 81 - 98 fL 90   MCH Latest Ref Range: 27.3 - 33.6 pg 29.7   MCHC Latest Ref Range: 32.2 - 36.5 g/dL 33.2   Platelet Count Latest Ref Range: 150 - 400 10*3/uL 191   RDW-CV Latest Ref Range: 11.6 - 14.4 % 12.8   % Neutrophils Latest Units: % 66   % Lymphocytes Latest Units: % 25   % Monocytes Latest Units: % 6   % Eosinophils Latest Units: % 2   % Basophils Latest Units: % 1   % Immature Granulocytes Latest Units: % 0   Neutrophils Latest Ref Range: 1.80 - 7.00 10*3/uL 2.18   Absolute Lymphocyte Count Latest Ref Range: 1.00 - 4.80 10*3/uL 0.82 (L)   Monocytes Latest Ref Range: 0.00 - 0.80 10*3/uL 0.21   Absolute  Eosinophil Count Latest Ref Range: 0.00 - 0.50 10*3/uL 0.08   Basophils Latest Ref Range: 0.00 - 0.20 10*3/uL 0.02   Immature Granulocytes Latest Ref Range: 0.00 - 0.05 10*3/uL 0.01   WBC Morphology Unknown See Diff   RBC Morphology Unknown See RBC data   Platelet Morphology Unknown See PLT count   Smear Scan Unknown No significant WBC morphological abnormalities.   Folate, SRM Latest Ref Range: >5.8 ng/mL >22.0   Vitamin B12 (Cobalamin) Latest Ref Range: 180 - 914 pg/mL 330        Ref. Range 03/09/2021 14:17   Hepatitis B S Ag w/Rflx PCR Latest Ref Range: NREAC  Nonreactive   Hepatitis B Surface Ab Unknown Reactive   Hepatitis B Surf Antibody Intl Units Latest Units: m[IU]/mL >1000.00   Hepatitis B Surface Antibody Interp Unknown Antibody to the hepatitis B surface antigen was detected. This antibody arises from either vaccin...   Hepatitis B Core Ab Latest Ref Range: NREAC  Nonreactive   Hepatitis B Core Antibody Interp Unknown Antibody to the Hepatitis B core  antigen (anti-HBc) was not detected.   Hepatitis C Antibody w/Rflx PCR Latest Ref Range: NREAC  Nonreactive   Hepatitis C Antibody Interpretation Unknown No evidence of an...   HIV Antigen and Antibody Rslt Latest Ref Range: NREAC  Nonreactive   HIV Ag And Ab Result Interp Unknown Nonreactive. No evidence of infection with HIV-1 or HIV-2.      Ref. Range 03/09/2021 14:17   Immunoglobulin G Latest Ref Range: 610 - 1,616 mg/dL 877   Immunoglobulin A Latest Ref Range: 84 - 499 mg/dL 96   Immunoglobulin M Latest Ref Range: 40 - 350 mg/dL 69   Anti Nuclear Antibody Latest Ref Range: NRN  Negative   ANA Pattern Unknown None   Hep2 Cytoplasmic Pattern Latest Ref Range: NRNIFA  Negative by IFA.   Ana Screen By Multiplex Latest Ref Range: NRN  Negative   Ana Interpretation Comment 1 Unknown Screen negative by IFA and multiplex.   ANA Panel Order Unknown ANA Comprehensive Plus Panel   Anti DFS70 Reflex Unknown Pend        Ref. Range 03/09/2021 14:17   Copper Latest  Ref Range: 80 - 155 ug/dL 101        Ref. Range 03/09/2021 14:17   Anti Nuclear Antibody Latest Ref Range: NRN  Negative   ANA Pattern Unknown None   Hep2 Cytoplasmic Pattern Latest Ref Range: NRNIFA  Negative by IFA.   Ana Screen By Multiplex Latest Ref Range: NRN  Negative   Ana Interpretation Comment 1 Unknown Screen negative by IFA and multiplex.   ANA Panel Order Unknown ANA Comprehensive Plus Panel   Anti DFS70 Reflex Unknown Anti DSF70 testing not indicated due to a negative ANA IFA result.                 Assessment:    #Neutropenia, resolved  #B12 borderline  #H/o of breast cancer s/p chemotherapy and radiation completed 08/2019    Yolanda Bowers is a very pleasant 50 year old woman with history of cT3N3 left breast invasive ductal carcinoma diagnosed in 2020, ER/PR/HER2+, s/p neoadjuvant chemotherapy with ddAC-THP, left mastectomy and SLN biopsy, and adjuvant radiation therapy completed 09/6710, complicated by CHF, s/p cardiac PVC ablation 03/2020, referred by Medical Oncology Exie Parody, MD for neutropenia.    She has normal baseline CBC prior to therapy for breast cancer in 12/2018 (12/29/18 WBC 4.36, ANC 3.03, Hgb 12, Hct 36, MCV 88, plts 205k).  She had adjuvant radiation, completed XRT 08/2019. Since completing therapy, she has had mild leukopenia (WBC ranging ~2 to 4) and ANC has been fluctuating from borderline low to normal intermittently.  Most recently CBC prior to initial hematology consultation 02/13/21 WBC 2.62, Hgb 12, Hct 35, MCV 87, plts 173k.    She does not have recurrent infections other than port site infection 11/2019.   We discussed the most common causes of mild chronic leukopenia/neutropenia in adults are constitutional neutropenia (eg, benign ethnic neutropenia), autoimmune neutropenia and drug-induced neutropenia. Additional causes include nutritional deficiencies, rheumatologic disorders, chronic infections such as Hepatitis and HIV. She has joint aches but attributes to  letrozole. No medication culprits or supplements.     Here 03/09/21 WBC 3.32, ANC 2.18 normalized, otherwise normal. P smear unremarkable. B12 lower end normal 330. Cu wnl. Hep B, C, HIV negative. ANA negative. Immunoglobulins normal. 04/15/21 WBC 2.63, slight decrease in ANC 1.42.  Today 06/10/21 WBC 3.54, ANC 2.03 normal.    Thus, discussed most likely etiology for mild leukopenia and  neutropenia after chemoradiation was slow bone marrow recovery.   No other cytopenias, B-sx, splenomegaly or adenopathy to suggest hematologic malignancy.   As long as ANC remains acceptable ([place] >1000) and she does not have frequent recurrent infections then suspect this maybe monitored and no intervention is needed. Should she encounter worsening decline in blood counts or recurrent infections, then would like to evaluate further (ie. consider bone marrow biopsy to evaluate for MDS, etc).   She will continue to have periodic CBCs w/ medical oncology. Refer back to hematology as needed.      Recommendations:  -neutropenia resolved today  -recommend to monitor CBC diff periodically q3 to 6 months (will do through medical oncology)  -Return to clinic as needed      It was a pleasure seeing Yolanda Bowers in clinic today. All of these issues were reviewed in detail with Yolanda Bowers and all questions were answered satisfactorily. She will call if any problems develop in the interim, particularly if she has increasing discomfort, or new or worsening symptoms. Please call our clinic with any questions or concerns [phone].    I spent a total of 25 minutes today with patient for face-to-face time as well as non-face-to-face time including review of laboratory and imaging results, ordering tests, documentation, counseling and care coordination.  When all questions were answered to their satisfaction we concluded our visit.       Daniel Nones, MD, 06/10/2021 8:16 PM      Cc: Leander Rams, MD  Patient Care Team:  Leander Rams, MD as PCP -  General (Geriatric Medicine)  Kittie Plater, MD as Oncologist (Medical Oncology)  Thereasa Parkin, MD as Consulting Physician (Psychiatry)  Marchelle Folks, MD as Consulting Physician (Radiation Oncology)  Daniel Nones, MD as Oncologist (Hematology/Oncology)  Juanetta Beets, MD as Cardiologist  (Advanced Heart Failure and Transplant Cardiology)  Langley Adie, RN as Registered Nurse (Cardiology)

## 2021-06-16 ENCOUNTER — Encounter (HOSPITAL_BASED_OUTPATIENT_CLINIC_OR_DEPARTMENT_OTHER): Payer: Self-pay | Admitting: Student in an Organized Health Care Education/Training Program

## 2021-06-20 ENCOUNTER — Ambulatory Visit (HOSPITAL_BASED_OUTPATIENT_CLINIC_OR_DEPARTMENT_OTHER): Payer: Commercial Managed Care - PPO

## 2021-06-22 ENCOUNTER — Telehealth (HOSPITAL_BASED_OUTPATIENT_CLINIC_OR_DEPARTMENT_OTHER): Payer: Self-pay | Admitting: Cardiovascular Disease

## 2021-06-22 ENCOUNTER — Other Ambulatory Visit (HOSPITAL_BASED_OUTPATIENT_CLINIC_OR_DEPARTMENT_OTHER): Payer: Self-pay | Admitting: Cardiovascular Disease

## 2021-06-22 NOTE — Telephone Encounter (Signed)
Pt called to discuss with Dr. Brennan Bailey team regarding arising symptoms such as heart palpitations.    Unable to reach RN, routing TE

## 2021-06-22 NOTE — Telephone Encounter (Signed)
RN TC to patient to assess symptoms    Over last 5 days pt reports lower than normal HR and BP    Today 10/31:    HR:50-55  109/57    Increase in palpitations, mostly ay night when laying down      Sob, dizziness, chest pressure with activity and rest    RN advised for Kanesha to go to closest ER to be evaluated. Jaelah agreed to plan. RN updating povider

## 2021-06-23 ENCOUNTER — Encounter (HOSPITAL_BASED_OUTPATIENT_CLINIC_OR_DEPARTMENT_OTHER): Payer: Self-pay | Admitting: Cardiovascular Disease

## 2021-06-23 ENCOUNTER — Other Ambulatory Visit (HOSPITAL_BASED_OUTPATIENT_CLINIC_OR_DEPARTMENT_OTHER): Payer: Self-pay | Admitting: Cardiovascular Disease

## 2021-06-23 DIAGNOSIS — I429 Cardiomyopathy, unspecified: Secondary | ICD-10-CM

## 2021-06-23 MED ORDER — VALSARTAN 40 MG OR TABS
ORAL_TABLET | ORAL | 2 refills | Status: DC
Start: 2021-06-23 — End: 2022-03-17

## 2021-06-23 NOTE — Telephone Encounter (Addendum)
RN called pt to check in after RN advised pt to go to ER yesterday 10/31    PT did not go to ER. Continues to have increase in palpitations, dizziness, chest tightness and SOB with normal activity.    RN and pt agreed she will contact PCP to get an EKG completed today.    RN strongly urged pt to go to ER several times, pt stated barriers such as the ER not being a good experience and concern that [employer] would not pay.

## 2021-06-23 NOTE — Telephone Encounter (Signed)
RN TC and LVM    Dr. [name] would like for her to come to clinic this Thursday 11/3 at 12:30, RN left instructions for call or message her response

## 2021-06-23 NOTE — Telephone Encounter (Signed)
Defer refill authorization decision to provider- please see ecare message from today 06/23/21

## 2021-06-25 ENCOUNTER — Ambulatory Visit
Admission: RE | Admit: 2021-06-25 | Discharge: 2021-06-25 | Disposition: A | Discharge status: Home / Self Care | Payer: Commercial Managed Care - PPO | Attending: Medical Oncology | Admitting: Medical Oncology

## 2021-06-25 ENCOUNTER — Other Ambulatory Visit: Payer: Self-pay

## 2021-06-25 ENCOUNTER — Ambulatory Visit (HOSPITAL_BASED_OUTPATIENT_CLINIC_OR_DEPARTMENT_OTHER): Payer: Commercial Managed Care - PPO | Admitting: Cardiovascular Disease

## 2021-06-25 VITALS — BP 101/70 | HR 74 | Temp 96.8°F | Ht 66.0 in | Wt 160.0 lb

## 2021-06-25 DIAGNOSIS — T451X5A Adverse effect of antineoplastic and immunosuppressive drugs, initial encounter: Secondary | ICD-10-CM

## 2021-06-25 DIAGNOSIS — Z853 Personal history of malignant neoplasm of breast: Secondary | ICD-10-CM

## 2021-06-25 DIAGNOSIS — I493 Ventricular premature depolarization: Secondary | ICD-10-CM | POA: Insufficient documentation

## 2021-06-25 DIAGNOSIS — Z170 Estrogen receptor positive status [ER+]: Secondary | ICD-10-CM | POA: Insufficient documentation

## 2021-06-25 DIAGNOSIS — F419 Anxiety disorder, unspecified: Secondary | ICD-10-CM | POA: Insufficient documentation

## 2021-06-25 DIAGNOSIS — I427 Cardiomyopathy due to drug and external agent: Secondary | ICD-10-CM | POA: Insufficient documentation

## 2021-06-25 DIAGNOSIS — C50412 Malignant neoplasm of upper-outer quadrant of left female breast: Secondary | ICD-10-CM

## 2021-06-25 LAB — COMPREHENSIVE METABOLIC PANEL
ALT (GPT): 14 U/L (ref 7–33)
AST (GOT): 20 U/L (ref 9–38)
Albumin: 4.6 g/dL (ref 3.5–5.2)
Alkaline Phosphatase (Total): 38 U/L (ref 34–121)
Anion Gap: 6 (ref 4–12)
Bilirubin (Total): 0.6 mg/dL (ref 0.2–1.3)
Calcium: 9.7 mg/dL (ref 8.9–10.2)
Carbon Dioxide, Total: 31 meq/L (ref 22–32)
Chloride: 102 meq/L (ref 98–108)
Creatinine: 0.78 mg/dL (ref 0.38–1.02)
Glucose: 81 mg/dL (ref 62–125)
Potassium: 3.9 meq/L (ref 3.6–5.2)
Protein (Total): 7 g/dL (ref 6.0–8.2)
Sodium: 139 meq/L (ref 135–145)
Urea Nitrogen: 18 mg/dL (ref 8–21)
eGFR by CKD-EPI 2021: >60 mL/min/{1.73_m2} (ref >59)

## 2021-06-25 LAB — CBC, DIFF
% Basophils: 0 %
% Eosinophils: 2 %
% Immature Granulocytes: 0 %
% Lymphocytes: 33 %
% Monocytes: 9 %
% Neutrophils: 56 %
% Nucleated RBC: 0 %
Absolute Eosinophil Count: 0.06 10*3/uL (ref 0.00–0.50)
Absolute Lymphocyte Count: 1.09 10*3/uL (ref 1.00–4.80)
Basophils: 0.01 10*3/uL (ref 0.00–0.20)
Hematocrit: 36 % (ref 36.0–45.0)
Hemoglobin: 12.4 g/dL (ref 11.5–15.5)
Immature Granulocytes: 0.01 10*3/uL (ref 0.00–0.05)
MCH: 30.5 pg (ref 27.3–33.6)
MCHC: 34.1 g/dL (ref 32.2–36.5)
MCV: 89 fL (ref 81–98)
Monocytes: 0.29 10*3/uL (ref 0.00–0.80)
Neutrophils: 1.8 10*3/uL (ref 1.80–7.00)
Nucleated RBC: 0 10*3/uL
Platelet Count: 172 10*3/uL (ref 150–400)
RBC: 4.07 10*6/uL (ref 3.80–5.00)
RDW-CV: 12.2 % (ref 11.0–14.5)
WBC: 3.26 10*3/uL — ABNORMAL LOW (ref 4.3–10.0)

## 2021-06-25 LAB — TROPONIN_I
Troponin_I Interpretation: NORMAL
Troponin_I: <0.03 ng/mL (ref <0.04)

## 2021-06-25 LAB — B_TYPE NATRIURETIC PEPTIDE: B_Type Natriuretic Peptide: 13 pg/mL (ref <101)

## 2021-06-25 NOTE — Progress Notes (Signed)
CARDIOLOGY CLINIC NOTE    Name:  Yolanda Bowers  Date of Birth: 1971-03-12  Medical Record Number: [id number]  Primary Care Physician:  Leander Rams, MD  Referring Provider:  Teena Dunk, *  Date of Service: 06/25/2021    ID  Yolanda Bowers is a 50 year old female with history of high-grade infiltrating ductal carcinoma of the left breast (ER+/PR+, FISH +) with angiolymphatic invasion, history of neoadjuvant chemotherapy with ddAC-THP, left mastectomy with lymph node biopsy, received 4 or 6 cycles of herceptin and Perjeta due to drop in EF, history of PVC ablation on 03/21/2020, here for follow-up.    Chief Complaint/Reason for Visit  Follow-Up     Last seen on 03/03/2021.  She called due to more symptoms over the past week.  Last week, started to have clamminess, fatigued, palpitations, feeling dizzy--> very similar to the palpitations at the start of her diagnosis.  Had to climb 4 flights of stairs, almost passed out--> shortness of breath, felt dizzy.  Still having hip and core strength weakness.  Also having worsening bilateral hand neuropathy    Have not been sleeping well--7 hours, but fragmented    She saw hematology in 05/2021 for neutropenia--> currently labile.  Recommendations are to follow-up closely.  No workup needed at this time.      No orthopnea/PND.    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      Anxiety [F41.9] 10/28/2020    PVC's (premature ventricular contractions) [I49.3] 02/18/2020     Ablation on 03/21/2020:   Successful ablation for PVCs from the interleaflet trigone in the LCC/RCC commisure below the aortic valve      Chemotherapy induced cardiomyopathy ([place]) [I42.7, T45.1X5A] 01/19/2020     Echo: 03/28/2019:  left ventricular end-diastolic dimension of 5.4UJ, EF of 60%, strain -20%, trabeculated LV, normal valves,  mild LAE, normal diastology    echocardiogram from 08/14/2019:  left ventricular end-diastolic dimension of 8.1XB, EF of 59%, GLS -20%, no change from 03/28/2019    Echocardiogram from 01/02/2020:   left ventricular end-diastolic dimension of 1.4NW, EF of 51%, GLS -13%, normal RV function, normal valves, decreased function compared to 08/14/2019    Coronary angiogram from 03/20/2020:  normal    cMRI from 03/18/2020:  1. The left ventricle (LV) is mildly dilated (LVEDVi= 94 ml/m2) with low normal systolic function (LVEF= 29%). No regional wall motion abnormalities. There is no LV hypertrophy (LVmassI= 45 gm/m2).    2. The right ventricle (RV) is normal in size (RVEDVi= 83 ml/m2) with normal systolic function (RVEF= 56%).     3. On delayed enhancement imaging there is intramyocardial late gadolinium enhancement at the inferior RV attachment site.    4. On limited T2-weighted imaging there is no evidence of myocardial edema.    5. On T1-mapping, the native T1 value is  1065m (normal).    6. No significant valvular abnormalities.    7. Left atrial area measures 25cm2 and LAVI= 44mm2 (dilated) via biplanar method; Right atrial area measures 14cm2 via 4-chamber monoplanar method.              Malignant neoplasm of upper-outer quadrant of left breast in female, estrogen receptor positive ([facility] [place][C50.412, Z17.0] 11/18/2019    Macromastia [N62] 08/31/2019    CIN I (cervical intraepithelial neoplasia I) [  N87.0] 06/01/2018     Review of patient's allergies indicates:  Allergies   Allergen Reactions    Oxycodone Skin: Itching    Codeine MP:[id number]    Lisinopril Resp: Cough    Walnut Skin: Rash    Paclitaxel Skin: Hives     Current Outpatient Medications   Medication Sig Dispense Refill    acetaminophen 500 MG tablet Take 1-2 tabs by mouth once daily as needed for pain.      aspirin 81 MG EC tablet Take 81 mg by mouth.      Calcium Citrate-Vitamin D (CITRACAL + D OR) Take 1 tablet by mouth daily.       cetirizine 10 MG tablet Take by mouth.      famotidine 20 MG tablet Take 1 tablet (20 mg) by mouth daily as needed for indigestion/heartburn.      letrozole 2.5 MG tablet Take 1 tablet (2.5 mg) by mouth daily. 90 tablet 3    methocarbamol 500 MG tablet Take 1 tablet (500 mg) by mouth at bedtime as needed for muscle spasms. 15 tablet 0    metoprolol succinate ER 25 MG 24 hr tablet Take 1 tablet (25 mg) by mouth daily. Do not chew or crush. 90 tablet 3    potassium chloride ER 10 MEQ ER tablet Take 1 tablet (10 mEq) by mouth daily. 90 tablet 3    valsartan 40 MG tablet Take 1 tablet (40 mg) by mouth 2 times daily 180 tablet 2    venlafaxine ER 75 MG 24 hr capsule Take 1 capsule (75 mg) by mouth every morning. 90 capsule 0     No current facility-administered medications for this visit.       Review of Systems:  A complete review of systems was otherwise negative except as noted above.   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      Physical Exam:  Wt Readings from Last 3 Encounters:   06/25/21 72.6 kg (160 lb)   06/10/21 74.4 kg (164 lb 0.4 oz)   05/20/21 73.1 kg (161 lb 1.6 oz)     BP 101/70    Pulse 74    Temp 36 C    Ht '5\' 6"'$  (1.676 m)    Wt 72.6 kg (160 lb)    SpO2 98%    BMI 25.82 kg/m     General: well developed female  in no acute distress  Eyes:  Anicteric sclera, PERRLA  Neck: No masses, normal carotid pulses without audible bruit, thyroid gland is normal in size  Chest:  Lungs are clear to auscultation without wheezes, rales, or rhonchi  Cardiovascular: regular rate and rhythm, normal S1 and S2, no S3 or S4, 1/6 holosystolic murmur, PMI felt midclavicular, JVP of 5-6 cm  Abdomen: soft non-tender, non-distended with  normal bowel sounds, liver span felt at the costal margin, no splenomegaly  Extremities: warm to touch bilaterally with +2 pulses in the femoral, popliteal, tibial, dorsalis pedis bilaterally , no edema bilaterally  Musculoskeletal:  Back is symmetric, no curvature, no tenderness.  Skin:  Normal skin color, texture.  No rashes or lesions  Neurologic:  Gait is normal, sensation grossly normal  Psychiatric:  Appropriate and responds to questions  Laboratory Results:   Results for orders placed or performed in visit on 06/25/21   Comprehensive Metabolic Panel   Result Value Ref Range    Sodium 139 135 - 145 meq/L    Potassium 3.9 3.6 - 5.2 meq/L    Chloride 102 98 - 108 meq/L    Carbon Dioxide, Total 31 22 - 32 meq/L    Anion Gap 6 4 - 12    Glucose 81 62 - 125 mg/dL    Urea Nitrogen 18 8 - 21 mg/dL    Creatinine 0.78 0.38 - 1.02 mg/dL    Protein (Total) 7.0 6.0 - 8.2 g/dL    Albumin 4.6 3.5 - 5.2 g/dL    Bilirubin (Total) 0.6 0.2 - 1.3 mg/dL    Calcium 9.7 8.9 - 10.2 mg/dL    AST (GOT) 20 9 - 38 U/L    Alkaline Phosphatase (Total) 38 34 - 121 U/L    ALT (GPT) 14 7 - 33 U/L    eGFR by CKD-EPI 2021 >60 >59 mL/min/[1.73_m2]   CBC with Differential   Result Value Ref Range    WBC 3.26 (L) 4.3 - 10.0 10*3/uL    RBC 4.07 3.80 - 5.00 10*6/uL    Hemoglobin 12.4 11.5 - 15.5 g/dL    Hematocrit 36 36.0 - 45.0 %    MCV 89 81 - 98 fL    MCH 30.5 27.3 - 33.6 pg    MCHC 34.1 32.2 - 36.5 g/dL    Platelet Count 172 150 - 400 10*3/uL    RDW-CV 12.2 11.0 - 14.5 %    % Neutrophils 56 %    % Lymphocytes 33 %    % Monocytes 9 %    % Eosinophils 2 %    % Basophils 0 %    % Immature Granulocytes 0 %    Neutrophils 1.80 1.80 - 7.00 10*3/uL    Absolute Lymphocyte Count 1.09 1.00 - 4.80 10*3/uL    Monocytes 0.29 0.00 - 0.80 10*3/uL    Absolute Eosinophil Count 0.06 0.00 - 0.50 10*3/uL    Basophils 0.01 0.00 - 0.20 10*3/uL    Immature Granulocytes 0.01 0.00 - 0.05 10*3/uL    Nucleated RBC 0.00 0.00 10*3/uL    % Nucleated RBC 0 %   B_Type  Natriuretic Peptide   Result Value Ref Range    B_Type Natriuretic Peptide 13 <101 pg/mL   Troponin_I   Result Value Ref Range    Troponin_I <0.03 <0.04 ng/mL    Troponin_I Interpretation Normal    EKG 12-LEAD   Result Value Ref Range    Ventricular Rate 63 BPM    Atrial Rate 63 BPM    P-R Interval 136 ms    QRS Duration 84 ms    Q-T Interval 416 ms    QTC Calculation 425 ms    P Axis 68 degrees    R Axis 73 degrees    T Axis 74 degrees    Diagnosis       NORMAL SINUS RHYTHM  LOW VOLTAGE QRS  BORDERLINE ECG  WHEN COMPARED WITH ECG OF 09-Jul-2020 17:10,  NO SIGNIFICANT CHANGE WAS FOUND       Results for orders placed or performed in visit on 01/30/20   Lipid Panel   Result Value Ref Range    Total Cholesterol 165 <200 mg/dL    Triglyceride 106 <150 mg/dL    HDL Cholesterol 63 >39 mg/dL    Cholesterol (LDL) 81 <130 mg/dL  Non-HDL Cholesterol 102 0 - 159 mg/dL    Cholesterol/HDL Ratio 2.6     Lipid Panel, Additional Info. (NOTE)          Imaging:    EKG:01/11/2020:  Normal sinus rhythm, heart rate of 74 bpm, normal PR interval, premature ventricular contractions, LAE    cardiopulmonary exercise test on 05/30/2020:  Max VO2 15 ml/kg/min (66%) , RER 1.21, normal blood pressure response to exercise, normal heart rate response, O2 pulse of 82%, 30% MVV    Echocardiogram from 07/29/2020:  Normal LV size and functoin EF of 61% with GLS of -19%, normal RV size and function, minimal MR..  EF has improved from 12/2019.    Holter monitor 07/29/2020:  Predominant rhythm: NSR   Atrial Tachycardia (AT) 13 episodes, Longest 5 beats @ Avg  83 bpm up to 103 bpm, Fastest 4 beats @ Avg 143 bpm up to  177 bpm   Accelerated Idioventricular Rhythm (AIVR)   PAC 0.03 %   PVC 0.11 %    GDMT:  ACEI/ARB/ARNI: valsartan '40mg'$  BID  Beta-blockers: toprol XL 12.'5mg'$  daily  MRAs:  SGLT2 inhibitor:  Diuretics:  Statin:  Digoxin:     Assessment:   50 year old female with history of low normal cardiomyopathy following chemotherapy for high-grade  infiltrating ductal carcinoma of the left breast (ER+/PR+, FISH +) with angiolymphatic invasion, history of neoadjuvant chemotherapy with ddAC-THP, left mastectomy with lymph node biopsy, received Herceptin and Perjeta, which was discontinued after persistent decline in EF.  She completed 4 of 6 cycles.  S/p PVC ablation without LGE on cMRI with improvement in functional capacity and EF.  She is having recurrent symptoms of palpitations likely caused from increased stressors and sleep deprivation.  Labs today are unremarkable.  However, I would like to proceed with repeating a holter monitor to assess PVC burden, exclude any other arrhythmias, and repeating echocardiogram to reassess biventricular function.    Patient was encouraged to modify stressors, practice mindful breathing techniques.      Plan:  1.  Repeat 7 day holter and echocardiogram  2.  continue with GDMT as note above  3.  Continue core strengthening exercises to help with overall endurance  4.  Monitor for fluid retention changes  5.  Encourage sleep    Follow-up:  Follow-up in 6 weeks.   Patient will call sooner if symptoms change.    I spent a total of 37 minutes for the patient's care on the date of the service.  This includes the time reviewing the chart, time with the patient in the exam room,  and time completing the note.     Juanetta Beets, MD  Division of Cardiology  Advanced Heart Failure/Cardiac Transplantation  06/25/2021

## 2021-06-25 NOTE — Patient Instructions (Signed)
We will proceed with repeat testing for increased episodes of palpitations.    7 day holter     Echocardiogram     No changes to medications    Continue with mindful breathing and stress reduction    Follow-up

## 2021-06-25 NOTE — Procedure Nursing Note (Signed)
7 day CAM monitor placed on patient, instructions given. Tracking [id number]

## 2021-06-28 ENCOUNTER — Encounter (HOSPITAL_BASED_OUTPATIENT_CLINIC_OR_DEPARTMENT_OTHER): Payer: Self-pay | Admitting: Student in an Organized Health Care Education/Training Program

## 2021-06-28 LAB — EKG 12 LEAD
Atrial Rate: 63 {beats}/min
Diagnosis: NORMAL
P Axis: 68 degrees
P-R Interval: 136 ms
Q-T Interval: 416 ms
QRS Duration: 84 ms
QTC Calculation: 425 ms
R Axis: 73 degrees
T Axis: 74 degrees
Ventricular Rate: 63 {beats}/min

## 2021-07-02 ENCOUNTER — Encounter (HOSPITAL_BASED_OUTPATIENT_CLINIC_OR_DEPARTMENT_OTHER): Payer: Self-pay | Admitting: Student in an Organized Health Care Education/Training Program

## 2021-07-02 ENCOUNTER — Other Ambulatory Visit (HOSPITAL_BASED_OUTPATIENT_CLINIC_OR_DEPARTMENT_OTHER): Payer: Self-pay | Admitting: Student in an Organized Health Care Education/Training Program

## 2021-07-02 DIAGNOSIS — M7062 Trochanteric bursitis, left hip: Secondary | ICD-10-CM

## 2021-07-02 DIAGNOSIS — M2550 Pain in unspecified joint: Secondary | ICD-10-CM

## 2021-07-02 MED ORDER — METHOCARBAMOL 500 MG OR TABS
500.0000 mg | ORAL_TABLET | Freq: Every evening | ORAL | 2 refills | Status: AC | PRN
Start: 2021-07-02 — End: ?

## 2021-07-08 ENCOUNTER — Encounter (HOSPITAL_BASED_OUTPATIENT_CLINIC_OR_DEPARTMENT_OTHER): Payer: Self-pay | Admitting: Cardiovascular Disease

## 2021-07-14 ENCOUNTER — Encounter (HOSPITAL_BASED_OUTPATIENT_CLINIC_OR_DEPARTMENT_OTHER): Payer: Self-pay | Admitting: Cardiovascular Disease

## 2021-07-23 NOTE — Telephone Encounter (Signed)
Holter results available for Dr. [name] to review.        Nursing will update patients with Dr. Brennan Bailey recommendations once available.

## 2021-07-31 ENCOUNTER — Ambulatory Visit (HOSPITAL_BASED_OUTPATIENT_CLINIC_OR_DEPARTMENT_OTHER): Payer: Commercial Managed Care - PPO | Admitting: Physician Assistant

## 2021-07-31 ENCOUNTER — Ambulatory Visit: Payer: Commercial Managed Care - PPO | Attending: Physician Assistant

## 2021-07-31 ENCOUNTER — Encounter (HOSPITAL_BASED_OUTPATIENT_CLINIC_OR_DEPARTMENT_OTHER): Payer: Self-pay | Admitting: Physician Assistant

## 2021-07-31 ENCOUNTER — Other Ambulatory Visit (HOSPITAL_BASED_OUTPATIENT_CLINIC_OR_DEPARTMENT_OTHER): Payer: Self-pay | Admitting: Physician Assistant

## 2021-07-31 DIAGNOSIS — Z170 Estrogen receptor positive status [ER+]: Secondary | ICD-10-CM | POA: Insufficient documentation

## 2021-07-31 DIAGNOSIS — Z808 Family history of malignant neoplasm of other organs or systems: Secondary | ICD-10-CM | POA: Insufficient documentation

## 2021-07-31 DIAGNOSIS — C50412 Malignant neoplasm of upper-outer quadrant of left female breast: Secondary | ICD-10-CM | POA: Insufficient documentation

## 2021-07-31 DIAGNOSIS — Z79811 Long term (current) use of aromatase inhibitors: Secondary | ICD-10-CM | POA: Insufficient documentation

## 2021-07-31 DIAGNOSIS — Z8041 Family history of malignant neoplasm of ovary: Secondary | ICD-10-CM | POA: Insufficient documentation

## 2021-07-31 LAB — CBC, ABS NEUTROPHIL
Hematocrit: 35 % — ABNORMAL LOW (ref 36.0–45.0)
Hemoglobin: 12.1 g/dL (ref 11.5–15.5)
Immature Granulocytes: 0.01 10*3/uL (ref 0.00–0.05)
MCH: 30.6 pg (ref 27.3–33.6)
MCHC: 34.4 g/dL (ref 32.2–36.5)
MCV: 89 fL (ref 81–98)
Neutrophils: 1.79 10*3/uL — ABNORMAL LOW (ref 1.80–7.00)
Platelet Count: 190 10*3/uL (ref 150–400)
RBC: 3.95 10*6/uL (ref 3.80–5.00)
RDW-CV: 12.4 % (ref 11.0–14.5)
WBC: 2.77 10*3/uL — ABNORMAL LOW (ref 4.3–10.0)

## 2021-07-31 LAB — COMPREHENSIVE METABOLIC PANEL
ALT (GPT): 14 U/L (ref 7–33)
AST (GOT): 18 U/L (ref 9–38)
Albumin: 4.3 g/dL (ref 3.5–5.2)
Alkaline Phosphatase (Total): 38 U/L (ref 34–121)
Anion Gap: 6 (ref 4–12)
Bilirubin (Total): 0.4 mg/dL (ref 0.2–1.3)
Calcium: 9.5 mg/dL (ref 8.9–10.2)
Carbon Dioxide, Total: 28 meq/L (ref 22–32)
Chloride: 106 meq/L (ref 98–108)
Creatinine: 0.78 mg/dL (ref 0.38–1.02)
Glucose: 88 mg/dL (ref 62–125)
Potassium: 3.9 meq/L (ref 3.6–5.2)
Protein (Total): 6.8 g/dL (ref 6.0–8.2)
Sodium: 140 meq/L (ref 135–145)
Urea Nitrogen: 20 mg/dL (ref 8–21)
eGFR by CKD-EPI 2021: >60 mL/min/{1.73_m2} (ref >59)

## 2021-08-05 ENCOUNTER — Other Ambulatory Visit (HOSPITAL_BASED_OUTPATIENT_CLINIC_OR_DEPARTMENT_OTHER): Payer: Self-pay | Admitting: Cardiovascular Disease

## 2021-08-05 DIAGNOSIS — I429 Cardiomyopathy, unspecified: Secondary | ICD-10-CM

## 2021-08-05 NOTE — Progress Notes (Signed)
[facility]  BREAST ONCOLOGY CLINIC NOTE    IDENTIFICATION  Yolanda Bowers is a 50 year old female with history of cT3N3 left breast invasive ductal carcinoma, ER/PR/HER2+, s/p neoadjuvant chemotherapy with ddAC-THP, left mastectomy and SLN biopsy, and adjuvant radiation therapy. She had a complete pathologic response at the time of surgery. Presents to clinic today for labs and follow up while on letrozole.     ONCOLOGIC HISTORY     Oncology History Overview Note   10/2018: [name]   11/24/2018: Bilateral mammogram revealed a negative right breast. To the left breast UOQ, there was an 8mm mass and a 2.4 cm mass laterally, 17 cm from the nipple. Targeted left breast ultrasound revealed at 3 o’clock 8 cm from the nipple, a 21x13x22mm mass and an enlarged axillary lymph node measuring 12x11x15mm. There were multiple enlarged lymph nodes  11/27/2018: US-guided biopsy of the breast mass revealed invasive ductal carcinoma, ER/PR+, HER2+, grade 3. There was angiolymphatic invasion seen on biopsy. Biopsy of axillary lymph node positive for carcinoma.   12/08/2018: Bilateral breast MRI revealed right breast at 3 o’clock, 2.7cm from the nipple, a 10 x 6 x8mm mass and at 12 o’clock, 11cm from the nipple, a spiculated 16 x 4 x 13mm mass. Ultrasound recommended.  To the left breast, mass consistent with biopsy proved carcinoma measuring 24 x 20 x 24mm. There was also diffuse NME involving mid-outer breast extending over 110x45x69mm. Within the NME, there was a small spiculated mass at 12 o’clock measuring 12mm. Multiple level 1,2 and 3 lymph nodes seen, in addition to at least 3 internal mammary nodes.   12/14/2018: PET CT without evidence of distant metastasis. There are 4mm lung nodules bilaterally.   12/29/2018: Started on neoadjuvant chemotherapy with ddAC x4 cycles.  03/09/2019: Started on neoadjuvant chemotherapy with Taxol, Herceptin, and  Perjeta.  06/25/2019: Underwent left mastectomy and SLN biopsy with Dr. David Byrd. Pathology revealed a single focus of angiolymphatic involvement by carcinoma (< 0.1cm). Negative surgical margins. 0 of 5 lymph nodes positive for carcinoma. ypT0 ypN0.   09/21/2019: Completed XRT.  Started on letrozole 2.5mg PO daily.     01/29/2020: Given drop in LVEF, last Herceptin/Perjeta infusion was discontinued. Letrozole continued.    Negative genetic panel testing, 11/2018.     Malignant neoplasm of upper-outer quadrant of left breast in female, estrogen receptor positive (HCC)   11/07/2019 - 01/29/2020 Systemic Therapy    pertuzumab (Perjeta) 840 mg in sodium chloride 0.9 % 250 mL IVPB, Intravenous, 4 of 6 cycles  trastuzumab (Herceptin) 593 mg in sodium chloride 0.9 % 250 mL IVPB, Intravenous, 4 of 6 cycles     11/18/2019 Initial Diagnosis    Malignant neoplasm of upper-outer quadrant of left breast in female, estrogen receptor positive (HCC)          INTERVAL HISTORY  Yolanda Bowers is overall quite well. She notes that her back and hip pain are improved some, with using a muscle relaxer approximately twice a week for this, as sometimes the pain affects her sleep. She continues to follow with Dr. Oh for this. She has found Dr. Oh very helpful in helping her pace herself with activity and gradually building back stamina.   She does note a dry cough for the past 3 weeks. This is substernal and did not improve with use of Zyrtec. She has some shortness of breath, with exertion only. She has reflux and will occasionally   take famotidine.  She also notes that her left chest wall is puffy and tight. She is trying to do lymphatic massage on her own. She denies any skin changes or nodularity. She has been active with shoveling snow. She continues therapy as well and finds this helpful.     ECOG Status: (0) Fully active, able to carry on all predisease performance without restriction     REVIEW OF SYSTEMS  A complete review of systems was conducted  and is negative in detail, except as noted in the interval history.    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 Lowella Dell    PR MASTECTOMY SIMPLE COMPLETE Left 06/25/2019    Had radiation        ALLERGIES  Review of patient's allergies indicates:  Allergies   Allergen Reactions    Oxycodone Skin: Itching    Codeine CZ:[id number]    Lisinopril Resp: Cough    Walnut Skin: Rash    Paclitaxel Skin: Hives        MEDICATIONS    Current Outpatient Medications:     acetaminophen 500 MG tablet, Take 1-2 tabs by mouth once daily as needed for pain., Disp: , Rfl:     aspirin 81 MG EC tablet, Take 81 mg by mouth., Disp: , Rfl:     Calcium Citrate-Vitamin D (CITRACAL + D OR), Take 1 tablet by mouth daily., Disp: , Rfl:     cetirizine 10 MG tablet, Take by mouth. (Patient not taking: Reported on 07/31/2021), Disp: , Rfl:     famotidine 20 MG tablet, Take 1 tablet (20 mg) by mouth daily as needed for indigestion/heartburn., Disp: , Rfl:     letrozole 2.5 MG tablet, Take 1 tablet (2.5 mg) by mouth daily., Disp: 90 tablet, Rfl: 3    methocarbamol 500 MG tablet, Take 1 tablet (500 mg) by mouth at bedtime as needed for muscle spasms., Disp: 30 tablet, Rfl: 2    metoprolol succinate ER 25 MG 24 hr tablet, Take 1 tablet (25 mg) by mouth daily. Do not chew or crush., Disp: 90 tablet, Rfl: 3    potassium chloride ER 10 MEQ ER tablet, Take 1 tablet (10 mEq) by mouth daily., Disp: 90 tablet, Rfl: 3    valsartan  40 MG tablet, Take 1 tablet (40 mg) by mouth 2 times daily, Disp: 180 tablet, Rfl: 2    venlafaxine ER 75 MG 24 hr capsule, Take 1 capsule (75 mg) by mouth every morning., Disp: 90 capsule, Rfl: 0     SOCIAL HISTORY:   Social History     Tobacco Use    Smoking status: Never    Smokeless tobacco: Never   Substance Use Topics    Alcohol use: Not Currently    Drug use: Not Currently        FAMILY HISTORY:    Cancer-related family history includes Ovarian  Cancer (age of onset: 59) in her paternal aunt; Skin Cancer in her father and maternal grandfather.     PHYSICAL EXAMINATION   The patient was asked if they would like to have a chaperone in the room for this exam.   The patient declined a chaperone.     VITAL SIGNS: BP 104/67    Pulse (!) 58    Temp 36.8 C (Oral)    Resp 16    Wt 73.2 kg (161 lb 6 oz)    SpO2 100%    BMI 26.05 kg/m       GENERAL: Well appearing, well developed female in no acute distress.  HEENT: Normocephalic, atraumatic. Sclerae anicteric. Pupils are equal, round.   NECK: Neck is supple without any appreciable lymphadenopathy or thyromegaly.  LUNGS: Respirations regular and unlabored. Clear to auscultation bilaterally.  CARDIAC: Regular rate and rhythm. No murmurs, gallops, or rubs.   BREASTS: Left breast s/p mastectomy with well healed surgical scars. Mild lymphedema noted. Right breast s/p reduction. No palpable masses, tenderness, or lesions. Nipple everted without discharge. No appreciable axillary lymphadenopathy bilaterally.  ABDOMEN: Soft, non-tender, non-distended. Normoactive bowel sounds. No hepatosplenomegaly or masses appreciated.  EXTREMITIES: No edema, clubbing, or cyanosis.  SKIN: No visible rash, bruising, or petechiae.  MSK: Without muscle or joint deformities. No spinal or hip tenderness to palpation. No sternal tenderness to palpation.   NEUROLOGICAL: Alert and oriented. Mental status appropriate.   NUTRITIONAL STATUS: Adequate.      Results for orders placed or performed  in visit on 07/31/21   CBC, Abs Neutrophil   Result Value Ref Range    WBC 2.77 (L) 4.3 - 10.0 10*3/uL    RBC 3.95 3.80 - 5.00 10*6/uL    Hemoglobin 12.1 11.5 - 15.5 g/dL    Hematocrit 35 (L) 36.0 - 45.0 %    MCV 89 81 - 98 fL    MCH 30.6 27.3 - 33.6 pg    MCHC 34.4 32.2 - 36.5 g/dL    Platelet Count 190 150 - 400 10*3/uL    RDW-CV 12.4 11.0 - 14.5 %    Absolute Neutrophil Comment Comment not required     Neutrophils 1.79 (L) 1.80 - 7.00 10*3/uL    Immature Granulocytes 0.01 0.00 - 0.05 10*3/uL    RBC Morphology See RBC data     Platelet Morphology See PLT count     WBC Morphology See Diff    Comprehensive Metabolic Panel   Result Value Ref Range    Sodium 140 135 - 145 meq/L    Potassium 3.9 3.6 - 5.2 meq/L    Chloride 106 98 - 108 meq/L    Carbon Dioxide, Total 28 22 - 32 meq/L    Anion Gap 6 4 - 12    Glucose 88 62 - 125 mg/dL    Urea Nitrogen 20 8 - 21 mg/dL    Creatinine 0.78 0.38 - 1.02 mg/dL    Protein (Total) 6.8 6.0 - 8.2 g/dL    Albumin 4.3 3.5 - 5.2 g/dL    Bilirubin (Total) 0.4 0.2 - 1.3 mg/dL    Calcium 9.5 8.9 - 10.2 mg/dL    AST (GOT) 18 9 - 38 U/L    Alkaline Phosphatase (Total) 38 34 - 121 U/L    ALT (GPT) 14 7 - 33 U/L    eGFR by CKD-EPI 2021 >60 >59 mL/min/[1.73_m2]         ASSESSMENT/PLAN  Yolanda Bowers is a 49 year  old female with history of cT3N3 left breast invasive ductal carcinoma, ER/PR/HER2+, s/p neoadjuvant chemotherapy with ddAC-THP, left mastectomy and SLN biopsy, and adjuvant radiation therapy. She had a complete pathologic response at the time of surgery. Presents to clinic today for labs and follow up while on adjuvant letrozole.    1. Malignant neoplasm of upper-outer quadrant of left breast in female, estrogen receptor positive (HCC)  Yolanda Bowers is doing well overall today from a breast cancer perspective without evidence of recurrence based on review of systems and clinical exam. Continue to follow with Dr. Oh for lymphedema, myalgias, and arthralgias on treatment. I asked her to  monitor her lymphedema to her chest wall closely and let us know if this worsens or becomes associated with other changes.   For patients cough, substernal and could be related to GERD. CT chest in October unremarkable. We discussed trial of Yolanda Bowers for 2 weeks and then follow up with us. If cough persists/worsens, recommend another CT chest for further evaluation.     Breast Cancer:   Yolanda Bowers is tolerating letrozole well without significant side effect. Continue for goal of 5 years.  -R mammogram done at [facility], repeat 05/2022.    Bone Health:  -Dexa: scan on 3/21 showing osteopenia, due again ~10/2021     Psych:    -Patient working with counselor at [facility], continued to encourage reaching out if things worsen.     Survivorship:  - Counseled on regular exercise and dietary measures to reduce risk of recurrence.    Orders Placed This Encounter   • Dexa, Axial Skeleton   • CBC with Differential   • Comprehensive Metabolic Panel   • Clinic Visit Physician   • Phone Visit      DISPOSITION  Patient to return to clinic in 4-6 months for follow up and surveillance. Patient encouraged to contact clinic in the interim with any questions or concerns.

## 2021-08-06 MED ORDER — METOPROLOL SUCCINATE ER 25 MG OR TB24
25.0000 mg | EXTENDED_RELEASE_TABLET | Freq: Every day | ORAL | 0 refills | Status: DC
Start: 2021-08-06 — End: 2021-08-21

## 2021-08-12 ENCOUNTER — Telehealth (HOSPITAL_BASED_OUTPATIENT_CLINIC_OR_DEPARTMENT_OTHER): Payer: Commercial Managed Care - PPO | Admitting: Student in an Organized Health Care Education/Training Program

## 2021-08-12 ENCOUNTER — Encounter (HOSPITAL_BASED_OUTPATIENT_CLINIC_OR_DEPARTMENT_OTHER): Payer: Commercial Managed Care - PPO | Admitting: Student in an Organized Health Care Education/Training Program

## 2021-08-12 ENCOUNTER — Encounter (HOSPITAL_BASED_OUTPATIENT_CLINIC_OR_DEPARTMENT_OTHER): Payer: Self-pay | Admitting: Student in an Organized Health Care Education/Training Program

## 2021-08-21 ENCOUNTER — Ambulatory Visit (HOSPITAL_BASED_OUTPATIENT_CLINIC_OR_DEPARTMENT_OTHER): Payer: Commercial Managed Care - PPO

## 2021-08-21 ENCOUNTER — Other Ambulatory Visit: Payer: Self-pay

## 2021-08-21 ENCOUNTER — Encounter (HOSPITAL_BASED_OUTPATIENT_CLINIC_OR_DEPARTMENT_OTHER): Payer: Commercial Managed Care - PPO

## 2021-08-21 ENCOUNTER — Ambulatory Visit: Payer: Commercial Managed Care - PPO | Attending: Cardiovascular Disease | Admitting: Cardiovascular Disease

## 2021-08-21 VITALS — BP 108/68 | HR 62 | Temp 97.9°F | Ht 66.0 in | Wt 161.8 lb

## 2021-08-21 DIAGNOSIS — Z170 Estrogen receptor positive status [ER+]: Secondary | ICD-10-CM | POA: Insufficient documentation

## 2021-08-21 DIAGNOSIS — I427 Cardiomyopathy due to drug and external agent: Secondary | ICD-10-CM

## 2021-08-21 DIAGNOSIS — T451X5A Adverse effect of antineoplastic and immunosuppressive drugs, initial encounter: Secondary | ICD-10-CM

## 2021-08-21 DIAGNOSIS — C50412 Malignant neoplasm of upper-outer quadrant of left female breast: Secondary | ICD-10-CM | POA: Insufficient documentation

## 2021-08-21 DIAGNOSIS — I429 Cardiomyopathy, unspecified: Secondary | ICD-10-CM | POA: Insufficient documentation

## 2021-08-21 DIAGNOSIS — F419 Anxiety disorder, unspecified: Secondary | ICD-10-CM | POA: Insufficient documentation

## 2021-08-21 LAB — CBC, DIFF
% Basophils: 1 %
% Eosinophils: 3 %
% Immature Granulocytes: 0 %
% Lymphocytes: 32 %
% Monocytes: 9 %
% Neutrophils: 55 %
% Nucleated RBC: 0 %
Absolute Eosinophil Count: 0.09 10*3/uL (ref 0.00–0.50)
Absolute Lymphocyte Count: 1.14 10*3/uL (ref 1.00–4.80)
Basophils: 0.02 10*3/uL (ref 0.00–0.20)
Hematocrit: 39 % (ref 36.0–45.0)
Hemoglobin: 13 g/dL (ref 11.5–15.5)
Immature Granulocytes: 0 10*3/uL (ref 0.00–0.05)
MCH: 29.8 pg (ref 27.3–33.6)
MCHC: 33.2 g/dL (ref 32.2–36.5)
MCV: 90 fL (ref 81–98)
Monocytes: 0.34 10*3/uL (ref 0.00–0.80)
Neutrophils: 2.03 10*3/uL (ref 1.80–7.00)
Nucleated RBC: 0 10*3/uL
Platelet Count: 192 10*3/uL (ref 150–400)
RBC: 4.36 10*6/uL (ref 3.80–5.00)
RDW-CV: 12.3 % (ref 11.0–14.5)
WBC: 3.62 10*3/uL — ABNORMAL LOW (ref 4.3–10.0)

## 2021-08-21 LAB — COMPREHENSIVE METABOLIC PANEL
ALT (GPT): 18 U/L (ref 7–33)
AST (GOT): 20 U/L (ref 9–38)
Albumin: 4.6 g/dL (ref 3.5–5.2)
Alkaline Phosphatase (Total): 42 U/L (ref 34–121)
Anion Gap: 6 (ref 4–12)
Bilirubin (Total): 0.5 mg/dL (ref 0.2–1.3)
Calcium: 9.6 mg/dL (ref 8.9–10.2)
Carbon Dioxide, Total: 31 meq/L (ref 22–32)
Chloride: 103 meq/L (ref 98–108)
Creatinine: 0.8 mg/dL (ref 0.38–1.02)
Glucose: 74 mg/dL (ref 62–125)
Potassium: 3.8 meq/L (ref 3.6–5.2)
Protein (Total): 7.1 g/dL (ref 6.0–8.2)
Sodium: 140 meq/L (ref 135–145)
Urea Nitrogen: 16 mg/dL (ref 8–21)
eGFR by CKD-EPI 2021: >60 mL/min/{1.73_m2} (ref >59)

## 2021-08-21 MED ORDER — METOPROLOL SUCCINATE ER 25 MG OR TB24
25.0000 mg | EXTENDED_RELEASE_TABLET | Freq: Every day | ORAL | 3 refills | Status: DC
Start: 2021-08-21 — End: 2022-04-27

## 2021-08-21 NOTE — Patient Instructions (Signed)
No changes to medications    Please continue to modify stress    Follow-up in 4 months

## 2021-08-21 NOTE — Progress Notes (Signed)
CARDIOLOGY CLINIC NOTE    Name:  Yolanda Bowers  Date of Birth: [birth year]  Medical Record Number: [id number]  Primary Care Physician:  Leander Rams, MD  Referring Provider:  Teena Dunk, *  Date of Service: 08/21/2021    ID  Yolanda Bowers is a 50 year old female with history of high-grade infiltrating ductal carcinoma of the left breast (ER+/PR+, FISH +) with angiolymphatic invasion, history of neoadjuvant chemotherapy with ddAC-THP, left mastectomy with lymph node biopsy, received 4 or 6 cycles of herceptin and Perjeta due to drop in EF, history of PVC ablation on 03/21/2020, here for follow-up.    Chief Complaint/Reason for Visit  Follow-Up     Last seen on 06/25/2021.  Recently completed holter monitor showing no significant arrhythmias.  Saw Dr. Erma Heritage, oncology--> doing well no changes.    Also saw orthopedics for numbness/tingling--> monitor for now for carpal tunnel.    Feeling a lot better since the last visit.  Less palpitations with modifying stress.    Appetite:  Intact    Not sleeping well--> ongoing issue.       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      Anxiety [F41.9] 10/28/2020    PVC's (premature ventricular contractions) [I49.3] 02/18/2020     Ablation on 03/21/2020:   Successful ablation for PVCs from the interleaflet trigone in the LCC/RCC commisure below the aortic valve      Chemotherapy induced cardiomyopathy ([place]) [I42.7, T45.1X5A] 01/19/2020     Echo: 03/28/2019:  left ventricular end-diastolic dimension of 8.9FY, EF of 60%, strain -20%, trabeculated LV, normal valves, mild LAE, normal diastology    echocardiogram from 08/14/2019:  left ventricular end-diastolic dimension of 1.0FB, EF of 59%, GLS -20%, no change from 03/28/2019    Echocardiogram from 01/02/2020:   left ventricular end-diastolic dimension of 5.1WC,  EF of 51%, GLS -13%, normal RV function, normal valves, decreased function compared to 08/14/2019    Coronary angiogram from 03/20/2020:  normal    cMRI from 03/18/2020:  1. The left ventricle (LV) is mildly dilated (LVEDVi= 94 ml/m2) with low normal systolic function (LVEF= 58%). No regional wall motion abnormalities. There is no LV hypertrophy (LVmassI= 45 gm/m2).    2. The right ventricle (RV) is normal in size (RVEDVi= 83 ml/m2) with normal systolic function (RVEF= 52%).     3. On delayed enhancement imaging there is intramyocardial late gadolinium enhancement at the inferior RV attachment site.    4. On limited T2-weighted imaging there is no evidence of myocardial edema.    5. On T1-mapping, the native T1 value is  1028m (normal).    6. No significant valvular abnormalities.    7. Left atrial area measures 25cm2 and LAVI= 449mm2 (dilated) via biplanar method; Right atrial area measures 14cm2 via 4-chamber monoplanar method.              Malignant neoplasm of upper-outer quadrant of left breast in female, estrogen receptor positive ([place][C50.412, Z17.0] 11/18/2019    Macromastia [N62] 08/31/2019    CIN I (cervical intraepithelial neoplasia I) [N87.0] 06/01/2018     Review of patient's allergies indicates:  Allergies   Allergen Reactions    Oxycodone Skin: Itching    Codeine GIDP:OEUMPN/TIRWERXV  Lisinopril Resp: Cough    Walnut Skin: Rash  Paclitaxel Skin: Hives     Current Outpatient Medications   Medication Sig Dispense Refill    acetaminophen 500 MG tablet Take 1-2 tabs by mouth once daily as needed for pain. (Patient not taking: Reported on 08/21/2021)      aspirin 81 MG EC tablet Take 81 mg by mouth.      Calcium Citrate-Vitamin D (CITRACAL + D OR) Take 1 tablet by mouth daily.      cetirizine 10 MG tablet Take by mouth. (Patient not taking: Reported on 07/31/2021)      famotidine 20 MG tablet Take 20 mg by mouth 2 times a day.      letrozole 2.5 MG tablet Take 1 tablet (2.5 mg) by  mouth daily. 90 tablet 3    methocarbamol 500 MG tablet Take 1 tablet (500 mg) by mouth at bedtime as needed for muscle spasms. 30 tablet 2    metoprolol succinate ER 25 MG 24 hr tablet Take 1 tablet (25 mg) by mouth daily. Do not chew or crush. 90 tablet 3    potassium chloride ER 10 MEQ ER tablet Take 1 tablet (10 mEq) by mouth daily. 90 tablet 3    valsartan 40 MG tablet Take 1 tablet (40 mg) by mouth 2 times daily 180 tablet 2    venlafaxine ER 75 MG 24 hr capsule Take 1 capsule (75 mg) by mouth every morning. 90 capsule 0     No current facility-administered medications for this visit.       Review of Systems:  A complete review of systems was otherwise negative except as noted above.   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      Physical Exam:  Wt Readings from Last 3 Encounters:   08/21/21 73.4 kg (161 lb 12.8 oz)   07/31/21 73.2 kg (161 lb 6 oz)   06/25/21 72.6 kg (160 lb)     BP 108/68    Pulse 62    Temp 36.6 C (Temporal)    Ht '5\' 6"'$  (1.676 m)    Wt 73.4 kg (161 lb 12.8 oz)    SpO2 100%    BMI 26.12 kg/m     General: well developed female  in no acute distress  Eyes:  Anicteric sclera, PERRLA  Neck: No masses, normal carotid pulses without audible bruit, thyroid gland is normal in size  Chest:  Lungs are clear to auscultation without wheezes, rales, or rhonchi  Cardiovascular: regular rate and rhythm, normal S1 and S2, no S3 or S4, 1/6 holosystolic murmur, PMI felt midclavicular, JVP of 5-6 cm  Abdomen: soft non-tender, non-distended with normal bowel sounds, liver span felt at the costal margin, no splenomegaly  Extremities: warm to touch bilaterally with +2 pulses in the femoral, popliteal, tibial, dorsalis pedis  bilaterally , no edema bilaterally  Musculoskeletal:  Back is symmetric, no curvature, no tenderness.  Skin:  Normal skin color, texture.  No rashes or lesions  Neurologic:  Gait is normal, sensation grossly normal  Psychiatric:  Appropriate and responds to questions    Laboratory Results:   Results for orders placed or performed in visit on 08/21/21   Comprehensive Metabolic Panel   Result Value Ref Range  Sodium 140 135 - 145 meq/L    Potassium 3.8 3.6 - 5.2 meq/L    Chloride 103 98 - 108 meq/L    Carbon Dioxide, Total 31 22 - 32 meq/L    Anion Gap 6 4 - 12    Glucose 74 62 - 125 mg/dL    Urea Nitrogen 16 8 - 21 mg/dL    Creatinine 0.80 0.38 - 1.02 mg/dL    Protein (Total) 7.1 6.0 - 8.2 g/dL    Albumin 4.6 3.5 - 5.2 g/dL    Bilirubin (Total) 0.5 0.2 - 1.3 mg/dL    Calcium 9.6 8.9 - 10.2 mg/dL    AST (GOT) 20 9 - 38 U/L    Alkaline Phosphatase (Total) 42 34 - 121 U/L    ALT (GPT) 18 7 - 33 U/L    eGFR by CKD-EPI 2021 >60 >59 mL/min/[1.73_m2]   CBC with Differential   Result Value Ref Range    WBC 3.62 (L) 4.3 - 10.0 10*3/uL    RBC 4.36 3.80 - 5.00 10*6/uL    Hemoglobin 13.0 11.5 - 15.5 g/dL    Hematocrit 39 36.0 - 45.0 %    MCV 90 81 - 98 fL    MCH 29.8 27.3 - 33.6 pg    MCHC 33.2 32.2 - 36.5 g/dL    Platelet Count 192 150 - 400 10*3/uL    RDW-CV 12.3 11.0 - 14.5 %    % Neutrophils 55 %    % Lymphocytes 32 %    % Monocytes 9 %    % Eosinophils 3 %    % Basophils 1 %    % Immature Granulocytes 0 %    Neutrophils 2.03 1.80 - 7.00 10*3/uL    Absolute Lymphocyte Count 1.14 1.00 - 4.80 10*3/uL    Monocytes 0.34 0.00 - 0.80 10*3/uL    Absolute Eosinophil Count 0.09 0.00 - 0.50 10*3/uL    Basophils 0.02 0.00 - 0.20 10*3/uL    Immature Granulocytes 0.00 0.00 - 0.05 10*3/uL    Nucleated RBC 0.00 0.00 10*3/uL    % Nucleated RBC 0 %     Results for orders placed or performed in visit on 01/30/20   Lipid Panel   Result Value Ref Range    Total Cholesterol 165 <200 mg/dL    Triglyceride 106 <150 mg/dL    HDL Cholesterol 63  >39 mg/dL    Cholesterol (LDL) 81 <130 mg/dL    Non-HDL Cholesterol 102 0 - 159 mg/dL    Cholesterol/HDL Ratio 2.6     Lipid Panel, Additional Info. (NOTE)          Imaging:    EKG:01/11/2020:  Normal sinus rhythm, heart rate of 74 bpm, normal PR interval, premature ventricular contractions, LAE    cardiopulmonary exercise test on 05/30/2020:  Max VO2 15 ml/kg/min (66%) , RER 1.21, normal blood pressure response to exercise, normal heart rate response, O2 pulse of 82%, 30% MVV    Echocardiogram from 07/29/2020:  Normal LV size and functoin EF of 61% with GLS of -19%, normal RV size and function, minimal MR..  EF has improved from 12/2019.    Holter monitor   07/29/2020:  Predominant rhythm: NSR   Atrial Tachycardia (AT) 13 episodes, Longest 5 beats @ Avg  83 bpm up to 103 bpm, Fastest 4 beats @ Avg 143 bpm up to  177 bpm   Accelerated Idioventricular Rhythm (AIVR)   PAC 0.03 %   PVC 0.11 %  06/25/2021:  Predominant rhythm: NSR   Atrial Tachycardia (AT) 9 episodes, Longest 6 beats @ Avg  116 bpm up to 137 bpm, Fastest 4 beats @ Avg 157 bpm up to  209 bpm   Ectopic Atrial Rhythm (EAR)   PAC 0.03 %   PVC 0.04 %    GDMT:  ACEI/ARB/ARNI: valsartan 37m BID  Beta-blockers: toprol XL 12.527mdaily  MRAs:  SGLT2 inhibitor:  Diuretics:  Statin:  Digoxin:     Assessment:   [age]ear old female with history of low normal cardiomyopathy following chemotherapy for high-grade infiltrating ductal carcinoma of the left breast (ER+/PR+, FISH +) with angiolymphatic invasion, history of neoadjuvant chemotherapy with ddAC-THP, left mastectomy with lymph node biopsy, received Herceptin and Perjeta (completed 4/6 cycles due to decrement in EF), s/p PVC ablation without LGE on cMRI with improvement in functional capacity and EF.      Repeat holter shows minimal PVC and PAC burden.  Symptoms appears to be linked with stressors and sleep deprivation.  She continues to have challenges with sleep and will benefit with referral to sleep  clinic for evaluation and input.    Patient was encouraged to modify stressors, practice mindful breathing techniques.      Plan:  1.  Referral to sleep apnea clinic for ongoing sleep deprivation/challenges  2.  continue with GDMT as note above  3.  Modify stressors at home and at work  4.  Continue core strengthening exercises to help with overall endurance    Follow-up:  Follow-up in 4 months.   Patient will call sooner if symptoms change.    I spent a total of 32 minutes for the patient's care on the date of the service.  This includes the time reviewing the chart, time with the patient in the exam room,  and time completing the note.     ElJuanetta BeetsMD  Division of Cardiology  Advanced Heart Failure/Cardiac Transplantation  08/21/2021

## 2021-08-25 ENCOUNTER — Telehealth (HOSPITAL_BASED_OUTPATIENT_CLINIC_OR_DEPARTMENT_OTHER): Payer: Self-pay | Admitting: Cardiovascular Disease

## 2021-08-25 LAB — TRANSTHORACIC ECHO (TTE) COMPLETE
AoV max: 117.5 cm/s
Ascending aorta: 3 cm
E/E' ratio: 6
EF - 3D Echo: 59.3 %
EF: 59.9 %
IVSd: 0.83 cm
LV Systolic Volume (BP): 32.7 ml
LVIDd: 5 cm
LVIDs: 3.4 cm
LVPWd: 0.76 cm
RV Free wall pk S': 9.7 cm/s
RVDd: 3.8 cm

## 2021-08-25 NOTE — Telephone Encounter (Signed)
LVM requesting patient call to schedule their 4 mo f/u with Dr. [name]. Patient is due for follow up around 12/23/21.    Sending letter/ MyChart message.

## 2021-09-12 ENCOUNTER — Encounter (HOSPITAL_BASED_OUTPATIENT_CLINIC_OR_DEPARTMENT_OTHER): Payer: Self-pay | Admitting: Physician Assistant

## 2021-09-15 ENCOUNTER — Other Ambulatory Visit (HOSPITAL_BASED_OUTPATIENT_CLINIC_OR_DEPARTMENT_OTHER): Payer: Self-pay

## 2021-09-29 ENCOUNTER — Encounter (HOSPITAL_BASED_OUTPATIENT_CLINIC_OR_DEPARTMENT_OTHER): Payer: Self-pay

## 2021-10-16 ENCOUNTER — Encounter (HOSPITAL_BASED_OUTPATIENT_CLINIC_OR_DEPARTMENT_OTHER): Payer: Self-pay | Admitting: Physician Assistant

## 2021-10-16 DIAGNOSIS — Z170 Estrogen receptor positive status [ER+]: Secondary | ICD-10-CM

## 2021-10-16 DIAGNOSIS — C50412 Malignant neoplasm of upper-outer quadrant of left female breast: Secondary | ICD-10-CM

## 2021-10-20 ENCOUNTER — Encounter (HOSPITAL_BASED_OUTPATIENT_CLINIC_OR_DEPARTMENT_OTHER): Payer: Commercial Managed Care - PPO

## 2021-10-25 NOTE — Progress Notes (Deleted)
[facility] ADULT GENETICS CLINIC RETURN VISIT - TELEMEDICINE     ID/CC:  Yolanda Bowers is a 51 year old ***man who returns to the [facility] clinic for ***.       Distant Site Telemedicine Encounter      I conducted this encounter via secure, live, face-to-face video conference with the patient. I reviewed the risks and benefits of telemedicine as pertinent to this visit and the patient agreed to proceed.    Provider Location: {Required for Compliance:[id number]::"On-site location (clinic, hospital, on-site office)"}  Patient Location: {Required for Compliance (SDE):123536::"At home"}  Present with patient: {Required for Compliance:124298::"No one else present"}          INTERIM MEDICAL HISTORY  Yolanda Bowers was seen by me 09/01/2020 for dilated cardiomyopathy and PVCs following treatment for breast cancer. Her reduced EF was attributed to her chemotherapy treatment, but she was referred for genetic evaluation to see if there was a contributing underlying genetic predisposition. Genetic testing of 100 genes was negative and released to her 09/24/20 and available in the lab tab and in my note dated 09/01/20 as an addendum.      LABS    PHYSICAL EXAM      UPDATED FAMILY HISTORY  See the pedigree from the initial visit. New information today includes:     ASSESSMENT and PLAN      GENETIC MEDICINE ATTENDING   I spent a total time of *** minutes on [place] care today including *** minutes face-to-face with by telemedicine, the time before and after the visit for review of records and documentation in the electronic health record.

## 2021-10-29 ENCOUNTER — Other Ambulatory Visit (HOSPITAL_BASED_OUTPATIENT_CLINIC_OR_DEPARTMENT_OTHER): Payer: Self-pay | Admitting: Physician Assistant

## 2021-10-29 ENCOUNTER — Telehealth (HOSPITAL_BASED_OUTPATIENT_CLINIC_OR_DEPARTMENT_OTHER): Payer: Self-pay | Admitting: Clinical Genetics (M.D.)

## 2021-10-29 ENCOUNTER — Ambulatory Visit (HOSPITAL_BASED_OUTPATIENT_CLINIC_OR_DEPARTMENT_OTHER): Payer: 59

## 2021-10-29 DIAGNOSIS — C50412 Malignant neoplasm of upper-outer quadrant of left female breast: Secondary | ICD-10-CM

## 2021-10-29 MED ORDER — LETROZOLE 2.5 MG OR TABS
2.5000 mg | ORAL_TABLET | Freq: Every day | ORAL | 3 refills | Status: DC
Start: 2021-10-29 — End: 2022-10-18

## 2021-11-04 ENCOUNTER — Encounter (HOSPITAL_BASED_OUTPATIENT_CLINIC_OR_DEPARTMENT_OTHER): Payer: Self-pay

## 2021-11-05 ENCOUNTER — Encounter (HOSPITAL_BASED_OUTPATIENT_CLINIC_OR_DEPARTMENT_OTHER): Payer: Commercial Managed Care - PPO

## 2021-11-06 ENCOUNTER — Encounter (HOSPITAL_BASED_OUTPATIENT_CLINIC_OR_DEPARTMENT_OTHER): Payer: Commercial Managed Care - PPO

## 2021-12-09 ENCOUNTER — Other Ambulatory Visit (HOSPITAL_BASED_OUTPATIENT_CLINIC_OR_DEPARTMENT_OTHER): Payer: Commercial Managed Care - PPO

## 2021-12-09 ENCOUNTER — Ambulatory Visit (HOSPITAL_BASED_OUTPATIENT_CLINIC_OR_DEPARTMENT_OTHER): Payer: 59

## 2021-12-09 ENCOUNTER — Ambulatory Visit: Payer: 59 | Attending: Medical Oncology | Admitting: Medical Oncology

## 2021-12-09 VITALS — BP 112/74 | HR 65 | Temp 97.9°F | Resp 16 | Wt 161.7 lb

## 2021-12-09 DIAGNOSIS — Z170 Estrogen receptor positive status [ER+]: Secondary | ICD-10-CM

## 2021-12-09 DIAGNOSIS — Z9221 Personal history of antineoplastic chemotherapy: Secondary | ICD-10-CM | POA: Insufficient documentation

## 2021-12-09 DIAGNOSIS — C50412 Malignant neoplasm of upper-outer quadrant of left female breast: Secondary | ICD-10-CM | POA: Insufficient documentation

## 2021-12-09 DIAGNOSIS — Z9012 Acquired absence of left breast and nipple: Secondary | ICD-10-CM | POA: Insufficient documentation

## 2021-12-09 DIAGNOSIS — Z923 Personal history of irradiation: Secondary | ICD-10-CM | POA: Insufficient documentation

## 2021-12-09 LAB — CBC, ABS NEUTROPHIL
% Nucleated RBC: 0 %
Hematocrit: 36 % (ref 36.0–45.0)
Hemoglobin: 12.2 g/dL (ref 11.5–15.5)
Immature Granulocytes: 0.01 10*3/uL (ref 0.00–0.05)
MCH: 30.4 pg (ref 27.3–33.6)
MCHC: 33.8 g/dL (ref 32.2–36.5)
MCV: 90 fL (ref 81–98)
Neutrophils: 2 10*3/uL (ref 1.80–7.00)
Nucleated RBC: 0 10*3/uL
Platelet Count: 193 10*3/uL (ref 150–400)
RBC: 4.01 10*6/uL (ref 3.80–5.00)
RDW-CV: 12.4 % (ref 11.0–14.5)
WBC: 3.19 10*3/uL — ABNORMAL LOW (ref 4.3–10.0)

## 2021-12-09 LAB — COMPREHENSIVE METABOLIC PANEL
ALT (GPT): 18 U/L (ref 7–33)
AST (GOT): 21 U/L (ref 9–38)
Albumin: 4.5 g/dL (ref 3.5–5.2)
Alkaline Phosphatase (Total): 46 U/L (ref 34–121)
Anion Gap: 5 (ref 4–12)
Bilirubin (Total): 0.3 mg/dL (ref 0.2–1.3)
Calcium: 9.4 mg/dL (ref 8.9–10.2)
Carbon Dioxide, Total: 30 meq/L (ref 22–32)
Chloride: 106 meq/L (ref 98–108)
Creatinine: 0.72 mg/dL (ref 0.38–1.02)
Glucose: 72 mg/dL (ref 62–125)
Potassium: 3.6 meq/L (ref 3.6–5.2)
Protein (Total): 6.8 g/dL (ref 6.0–8.2)
Sodium: 141 meq/L (ref 135–145)
Urea Nitrogen: 12 mg/dL (ref 8–21)
eGFR by CKD-EPI 2021: >60 mL/min/{1.73_m2} (ref >59)

## 2021-12-09 NOTE — Progress Notes (Signed)
[facility]  BREAST ONCOLOGY CLINIC NOTE    IDENTIFICATION  Yolanda Bowers is a 51 year old female with history of cT3N3 left breast invasive ductal carcinoma, ER/PR/HER2+, s/p neoadjuvant chemotherapy with ddAC-THP, left mastectomy and SLN biopsy, and adjuvant radiation therapy. She had a complete pathologic response at the time of surgery. Presents to clinic today for labs and follow up while on letrozole.     ONCOLOGIC HISTORY     Oncology History Overview Note   10/2018: [name]   11/24/2018: Bilateral mammogram revealed a negative right breast. To the left breast UOQ, there was an 72m mass and a 2.4 cm mass laterally, 17 cm from the nipple. Targeted left breast ultrasound revealed at 3 o'clock 8 cm from the nipple, a 21x13x221mmass and an enlarged axillary lymph node measuring 12x11x1590mThere were multiple enlarged lymph nodes  11/27/2018: US-guided biopsy of the breast mass revealed invasive ductal carcinoma, ER/PR+, HER2+, grade 3. There was angiolymphatic invasion seen on biopsy. Biopsy of axillary lymph node positive for carcinoma.   12/08/2018: Bilateral breast MRI revealed right breast at 3 o'clock, 2.7cm from the nipple, a 10 x 6 x8mm8mss and at 12 o'clock, 11cm from the nipple, a spiculated 16 x 4 x 13mm77ms. Ultrasound recommended.  To the left breast, mass consistent with biopsy proved carcinoma measuring 24 x 20 x 24mm.33mre was also diffuse NME involving mid-outer breast extending over 110x45x69mm. 60min the NME, there was a small spiculated mass at 12 o'clock measuring 12mm. M58mple level 1,2 and 3 lymph nodes seen, in addition to at least 3 internal mammary nodes.   12/14/2018: PET CT without evidence of distant metastasis. There are 4mm lung63mdules bilaterally.   12/29/2018: Started on neoadjuvant chemotherapy with ddAC x4 cycles.  03/09/2019: Started on neoadjuvant chemotherapy with Taxol, Herceptin, and  Perjeta.  06/25/2019: Underwent left mastectomy and SLN biopsy with Dr. David ByrImagene Shellergy revealed a single focus of angiolymphatic involvement by carcinoma (< 0.1cm). Negative surgical margins. 0 of 5 lymph nodes positive for carcinoma. ypT0 ypN0.   09/21/2019: Completed XRT.  Started on letrozole 2.5mg PO da1m.     01/29/2020: Given drop in LVEF, last Herceptin/Perjeta infusion was discontinued. Letrozole continued.    Negative genetic panel testing, 11/2018.     Malignant neoplasm of upper-outer quadrant of left breast in female, estrogen receptor positive (HCC)   3/1[place]021 - 01/29/2020 Systemic Therapy    pertuzumab (Perjeta) 840 mg in sodium chloride 0.9 % 250 mL IVPB, Intravenous, 4 of 6 cycles  trastuzumab (Herceptin) 593 mg in sodium chloride 0.9 % 250 mL IVPB, Intravenous, 4 of 6 cycles     11/18/2019 Initial Diagnosis    Malignant neoplasm of upper-outer quadrant of left breast in female, estrogen receptor positive (HCC)      [place] INTERVAL HISTORY    Since her last visit in 07/31/2021, Yolanda Bowers reports she is doing well. She struggled with the letrozole at first but now feels it is going well. Was having severe joint pain initially when moving was a struggle, but feels much more normal now. Rarely using a muscle relaxant unless she exerts herself too much, last time she needed it was 2 months ago. She does get some fatigue and has some limitations, does continue to report some dyspnea and coughing when she walks uphill. 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 Taking  exercise classes, walking, and riding her stationary bike at least 3 times per week. Hopes to be more active when the weather is normal. No longer having chest wall pain, is able to make large pottery on the wheel which was previously difficult for her.     Still endorsing a daily cough, non-productive. Also clearing her throat a lot. Not happening at work but is noted with exertion at times. Has not seen any improvement  with famotidine but does feel that taking allergy medication has helped significantly.     ECOG Status: (0) Fully active, able to carry on all predisease performance without restriction     REVIEW OF SYSTEMS  A complete review of systems was conducted and is negative in detail, except as noted in the interval history.    PAST MEDICAL HISTORY  Patient Active Problem List   Diagnosis   . Macromastia   . Malignant neoplasm of upper-outer quadrant of left breast in female, estrogen receptor positive ([place])   . CIN I (cervical intraepithelial neoplasia I)   . Chemotherapy induced cardiomyopathy ([place])   . PVC's (premature ventricular contractions)   . Anxiety   . 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 BREAST BIOPSY Right 11/2018    2 core bx     . BREAST RECONSTRUCTION Right 06/25/2019    reduction - Dr. Lowella Dell   . PR MASTECTOMY SIMPLE COMPLETE Left 06/25/2019    Had radiation        ALLERGIES  Review of patient's allergies indicates:  Allergies   Allergen Reactions   . Oxycodone Skin: Itching   . Codeine XF:GHWEXH/BZJIRCVE   . Lisinopril Resp: Cough   . Walnut Skin: Rash   . Paclitaxel Skin: Hives        MEDICATIONS    Current Outpatient Medications:   .  acetaminophen 500 MG tablet, Take 1-2 tabs by mouth once daily as needed for pain. (Patient not taking: Reported on 08/21/2021), Disp: , Rfl:   .  aspirin 81 MG EC tablet, Take 1 tablet (81 mg) by mouth., Disp: , Rfl:   .  Calcium Citrate-Vitamin D (CITRACAL + D OR), Take 1 tablet by mouth daily., Disp: , Rfl:   .  cetirizine 10 MG tablet, Take by mouth., Disp: , Rfl:   .  famotidine 20 MG tablet, Take 1 tablet (20 mg) by mouth 2 times a day., Disp: , Rfl:   .  letrozole 2.5 MG tablet, Take 1 tablet (2.5 mg) by mouth daily., Disp: 90 tablet, Rfl: 3  .  methocarbamol 500 MG tablet, Take 1 tablet (500 mg) by mouth at bedtime as needed for muscle spasms. (Patient not taking: Reported on 12/09/2021),  Disp: 30 tablet, Rfl: 2  .  metoprolol succinate ER 25 MG 24 hr tablet, Take 1 tablet (25 mg) by mouth daily. Do not chew or crush., Disp: 90 tablet, Rfl: 3  .  potassium chloride ER 10 MEQ ER tablet, Take 1 tablet (10 mEq) by mouth daily., Disp: 90 tablet, Rfl: 3  .  valsartan 40 MG tablet, Take 1 tablet (40 mg) by mouth 2 times daily, Disp: 180 tablet, Rfl: 2  .  venlafaxine ER 75 MG 24 hr capsule, Take 1 capsule (75 mg) by mouth every morning., Disp: 90 capsule, Rfl: 0     SOCIAL HISTORY:   Social History     Tobacco Use   .  Smoking status: Never   . Smokeless tobacco: Never   Substance Use Topics   . Alcohol use: Not Currently   . Drug use: Not Currently        FAMILY HISTORY:    Cancer-related family history includes Ovarian Cancer (age of onset: 23) in her paternal aunt; Skin Cancer in her father and maternal grandfather.     PHYSICAL EXAMINATION   The patient was asked if they would like to have a chaperone in the room for this exam.   The patient declined a chaperone.     VITAL SIGNS: BP 112/74   Pulse 65   Temp 36.6 C (Oral)   Resp 16   Wt 73.3 kg (161 lb 11.2 oz)   SpO2 100%   BMI 26.10 kg/m       GENERAL: Well appearing, well developed female in no acute distress.  HEENT: Normocephalic, atraumatic. Sclerae anicteric. Pupils are equal, round.   NECK: Neck is supple without any appreciable lymphadenopathy or thyromegaly.  LUNGS: Respirations regular and unlabored. Clear to auscultation bilaterally. No wheezing/rales/rhonci  CARDIAC: Regular rate and rhythm. No murmurs, gallops, or rubs.   BREASTS: Deferred  ABDOMEN: Soft, non-tender, non-distended. Normoactive bowel sounds. No hepatosplenomegaly or masses appreciated.  EXTREMITIES: No edema, clubbing, or cyanosis.  SKIN: No visible rash, bruising, or petechiae.  MSK: Without muscle or joint deformities. No spinal or hip tenderness to palpation. No sternal tenderness to palpation.   NEUROLOGICAL: Alert and oriented. Mental status appropriate.    NUTRITIONAL STATUS: Adequate.      Results for orders placed or performed in visit on 08/21/21   Comprehensive Metabolic Panel   Result Value Ref Range    Sodium 140 135 - 145 meq/L    Potassium 3.8 3.6 - 5.2 meq/L    Chloride 103 98 - 108 meq/L    Carbon Dioxide, Total 31 22 - 32 meq/L    Anion Gap 6 4 - 12    Glucose 74 62 - 125 mg/dL    Urea Nitrogen 16 8 - 21 mg/dL    Creatinine 0.80 0.38 - 1.02 mg/dL    Protein (Total) 7.1 6.0 - 8.2 g/dL    Albumin 4.6 3.5 - 5.2 g/dL    Bilirubin (Total) 0.5 0.2 - 1.3 mg/dL    Calcium 9.6 8.9 - 10.2 mg/dL    AST (GOT) 20 9 - 38 U/L    Alkaline Phosphatase (Total) 42 34 - 121 U/L    ALT (GPT) 18 7 - 33 U/L    eGFR by CKD-EPI 2021 >60 >59 mL/min/[1.73_m2]   CBC with Differential   Result Value Ref Range    WBC 3.62 (L) 4.3 - 10.0 10*3/uL    RBC 4.36 3.80 - 5.00 10*6/uL    Hemoglobin 13.0 11.5 - 15.5 g/dL    Hematocrit 39 36.0 - 45.0 %    MCV 90 81 - 98 fL    MCH 29.8 27.3 - 33.6 pg    MCHC 33.2 32.2 - 36.5 g/dL    Platelet Count 192 150 - 400 10*3/uL    RDW-CV 12.3 11.0 - 14.5 %    % Neutrophils 55 %    % Lymphocytes 32 %    % Monocytes 9 %    % Eosinophils 3 %    % Basophils 1 %    % Immature Granulocytes 0 %    Neutrophils 2.03 1.80 - 7.00 10*3/uL    Absolute Lymphocyte Count 1.14 1.00 - 4.80 10*3/uL  Monocytes 0.34 0.00 - 0.80 10*3/uL    Absolute Eosinophil Count 0.09 0.00 - 0.50 10*3/uL    Basophils 0.02 0.00 - 0.20 10*3/uL    Immature Granulocytes 0.00 0.00 - 0.05 10*3/uL    Nucleated RBC 0.00 0.00 10*3/uL    % Nucleated RBC 0 %         ASSESSMENT/PLAN  Yolanda Bowers is a 51 year old female with history of cT3N3 left breast invasive ductal carcinoma, ER/PR/HER2+, s/p neoadjuvant chemotherapy with ddAC-THP, left mastectomy and SLN biopsy, and adjuvant radiation therapy. She had a complete pathologic response at the time of surgery. Presents to clinic today for labs and follow up while on adjuvant letrozole.    1. Malignant neoplasm of upper-outer quadrant of left  breast in female, estrogen receptor positive ([place])  Yolanda Bowers is doing well overall today from a breast cancer perspective without evidence of recurrence based on review of systems and clinical exam. Her myalgias, chest wall pain, and exercise tolerance are all significantly improved from prior, which is good news. She overall appears to be recovering well from therapy and is tolerating letrozole well.   She does continue to have a persistent cough, chest CT in October was unremarkable. Does not appear to be related to GERD as prilosec did not help, but is experiencing some improvement with allergy treatment which is reassuring. Would not repeat imaging at this time but will continue to montoring closely. If cough is worsening will consider another chest CT for further evaluation.     Breast Cancer:   Yolanda Bowers is tolerating letrozole well without significant side effect. Continue for goal of 5 years.  -R mammogram done at [facility], repeat 05/2022.    Bone Health:  Reports her Bone scan was canceled by [facility]; she will ask her PCP to reorder this Friday. Will continue to monitor.   - Follow up repeat DEXA once complete     Psych:    -Patient working with counselor at [facility], continued to encourage reaching out if things worsen.     Survivorship:  - Counseled on regular exercise and dietary measures to reduce risk of recurrence.    Orders Placed This Encounter   . CBC, Abs Neutrophil   . Comprehensive Metabolic Panel      DISPOSITION  Patient to return to clinic in 4-6 months for follow up and surveillance. Patient encouraged to contact clinic in the interim with any questions or concerns.

## 2022-01-04 ENCOUNTER — Encounter (HOSPITAL_BASED_OUTPATIENT_CLINIC_OR_DEPARTMENT_OTHER): Payer: Self-pay

## 2022-01-13 ENCOUNTER — Encounter (HOSPITAL_BASED_OUTPATIENT_CLINIC_OR_DEPARTMENT_OTHER): Payer: Self-pay | Admitting: Medical Oncology

## 2022-02-11 ENCOUNTER — Other Ambulatory Visit: Payer: Self-pay

## 2022-02-11 ENCOUNTER — Ambulatory Visit: Payer: 59 | Attending: Cardiovascular Disease | Admitting: Cardiovascular Disease

## 2022-02-11 ENCOUNTER — Encounter (HOSPITAL_BASED_OUTPATIENT_CLINIC_OR_DEPARTMENT_OTHER): Payer: Self-pay | Admitting: Cardiovascular Disease

## 2022-02-11 ENCOUNTER — Other Ambulatory Visit (HOSPITAL_BASED_OUTPATIENT_CLINIC_OR_DEPARTMENT_OTHER): Payer: Self-pay | Admitting: Cardiovascular Disease

## 2022-02-11 VITALS — BP 108/62 | HR 68 | Temp 97.0°F | Ht 66.0 in | Wt 160.5 lb

## 2022-02-11 DIAGNOSIS — I427 Cardiomyopathy due to drug and external agent: Secondary | ICD-10-CM | POA: Insufficient documentation

## 2022-02-11 DIAGNOSIS — E876 Hypokalemia: Secondary | ICD-10-CM

## 2022-02-11 DIAGNOSIS — T451X5A Adverse effect of antineoplastic and immunosuppressive drugs, initial encounter: Secondary | ICD-10-CM | POA: Insufficient documentation

## 2022-02-11 NOTE — Patient Instructions (Addendum)
No changes    Continue with your current level of activity    Follow-up

## 2022-02-11 NOTE — Progress Notes (Signed)
CARDIOLOGY CLINIC NOTE    Name:  Yolanda Bowers  Date of Birth: [birth year]  Medical Record Number: [id number]  Primary Care Physician:  Yolanda Rams, MD  Referring Provider:  Teena Bowers, *  Date of Service: 02/11/2022    ID  Yolanda Bowers is a 51 year old female with history of high-grade infiltrating ductal carcinoma of the left breast (ER+/PR+, FISH +) with angiolymphatic invasion, history of neoadjuvant chemotherapy with ddAC-THP, left mastectomy with lymph node biopsy, received 4 or 6 cycles of herceptin and Perjeta due to drop in EF, on letrozole, history of PVC ablation on 03/21/2020, here for follow-up.    Chief Complaint/Reason for Visit  Follow-Up     Last seen on 08/21/2021.  Reports doing well.  She has rare palpitations now and only induced by days when she increases her caffeine intake or more fatigue during the day.  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  She is pacing herself at work.  She is also taking more naps when she gets home to rest before making dinner or doing house chores.  She reports being able to participate in water aerobics which is accelerating for her.    She has less anxiety and feels more in control of her life.    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        Anxiety [F41.9] 10/28/2020    PVC's (premature ventricular contractions) [I49.3] 02/18/2020     Ablation on 03/21/2020:  Successful ablation for PVCs from the interleaflet trigone in the LCC/RCC commisure below the aortic valve      Chemotherapy induced cardiomyopathy ([place]) [I42.7, T45.1X5A] 01/19/2020     Echo: 03/28/2019:  left ventricular end-diastolic dimension of 2.6ZT, EF of 60%, strain -20%, trabeculated LV, normal valves, mild LAE, normal diastology    echocardiogram from 08/14/2019:  left ventricular  end-diastolic dimension of 2.4PY, EF of 59%, GLS -20%, no change from 03/28/2019    Echocardiogram from 01/02/2020:   left ventricular end-diastolic dimension of 0.9XI, EF of 51%, GLS -13%, normal RV function, normal valves, decreased function compared to 08/14/2019    Coronary angiogram from 03/20/2020:  normal    cMRI from 03/18/2020:  1. The left ventricle (LV) is mildly dilated (LVEDVi= 94 ml/m2) with low normal systolic function (LVEF= 33%). No regional wall motion abnormalities. There is no LV hypertrophy (LVmassI= 45 gm/m2).     2. The right ventricle (RV) is normal in size (RVEDVi= 83 ml/m2) with normal systolic function (RVEF= 82%).      3. On delayed enhancement imaging there is intramyocardial late gadolinium enhancement at the inferior RV attachment site.     4. On limited T2-weighted imaging there is no evidence of myocardial edema.     5. On T1-mapping, the native T1 value is  105m (normal).     6. No significant valvular abnormalities.     7. Left atrial area measures 25cm2 and LAVI= 452mm2 (dilated) via biplanar method; Right atrial area measures 14cm2 via 4-chamber monoplanar method.              Malignant neoplasm of upper-outer quadrant of left breast in female, estrogen receptor positive ([facility][C50.412, Z17.0] 11/18/2019    Macromastia [N62] 08/31/2019    CIN I (cervical intraepithelial neoplasia I) [N87.0] 06/01/2018  Review of patient's allergies indicates:  Allergies   Allergen Reactions    Oxycodone Skin: Itching    Codeine UJ:WJXBJY/NWGNFAOZ    Lisinopril Resp: Cough    Walnut Skin: Rash    Paclitaxel Skin: Hives     Current Outpatient Medications   Medication Sig Dispense Refill    acetaminophen 500 MG tablet Take 1-2 tabs by mouth once daily as needed for pain. (Patient not taking: Reported on 08/21/2021)      aspirin 81 MG EC tablet Take 1 tablet (81 mg) by mouth.      Calcium Citrate-Vitamin D (CITRACAL + D OR) Take 1 tablet by mouth daily. (Patient not taking: Reported on 02/11/2022)       cetirizine 10 MG tablet Take by mouth.      famotidine 20 MG tablet Take 1 tablet (20 mg) by mouth as needed.      letrozole 2.5 MG tablet Take 1 tablet (2.5 mg) by mouth daily. 90 tablet 3    methocarbamol 500 MG tablet Take 1 tablet (500 mg) by mouth at bedtime as needed for muscle spasms. 30 tablet 2    metoprolol succinate ER 25 MG 24 hr tablet Take 1 tablet (25 mg) by mouth daily. Do not chew or crush. 90 tablet 3    potassium chloride ER 10 MEQ ER tablet Take 1 tablet (10 mEq) by mouth daily. 90 tablet 3    valsartan 40 MG tablet Take 1 tablet (40 mg) by mouth 2 times daily 180 tablet 2    venlafaxine ER 75 MG 24 hr capsule Take 1 capsule (75 mg) by mouth every morning. 90 capsule 0     No current facility-administered medications for this visit.       Review of Systems:  A complete review of systems was otherwise negative except as noted above.   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      Physical Exam:  Wt Readings from Last 3 Encounters:   02/11/22 72.8 kg (160 lb 8 oz)   12/09/21 73.3 kg (161 lb 11.2 oz)   08/21/21 73.4 kg (161 lb 12.8 oz)     BP 108/62   Pulse 68   Temp 36.1 C (Temporal)   Ht _0  (1.676 m)   Wt 72.8 kg (160 lb 8 oz)   SpO2 97%   BMI 25.91 kg/m     General: well developed female  in no acute distress  Eyes:  Anicteric sclera, PERRLA  Neck: No masses, normal carotid pulses without audible bruit, thyroid gland is normal in size  Chest:  Lungs are clear to auscultation without wheezes, rales, or rhonchi  Cardiovascular: regular rate and rhythm, normal S1 and S2, no S3 or S4, 1/6 holosystolic murmur, PMI felt midclavicular, JVP of 5-6 cm  Abdomen: soft non-tender, non-distended with normal bowel  sounds, liver span felt at the costal margin, no splenomegaly  Extremities: warm to touch bilaterally with +2 pulses in the femoral, popliteal, tibial, dorsalis pedis bilaterally , no edema bilaterally  Musculoskeletal:  Back is symmetric, no curvature, no tenderness.  Skin:  Normal skin color, texture.  No rashes or lesions  Neurologic:  Gait is normal, sensation grossly normal  Psychiatric:  Appropriate  and responds to questions    Laboratory Results:   Results for orders placed or performed in visit on 12/09/21   CBC, Abs Neutrophil   Result Value Ref Range    WBC 3.19 (L) 4.3 - 10.0 10*3/uL    RBC 4.01 3.80 - 5.00 10*6/uL    Hemoglobin 12.2 11.5 - 15.5 g/dL    Hematocrit 36 36.0 - 45.0 %    MCV 90 81 - 98 fL    MCH 30.4 27.3 - 33.6 pg    MCHC 33.8 32.2 - 36.5 g/dL    Platelet Count 193 150 - 400 10*3/uL    RDW-CV 12.4 11.0 - 14.5 %    Neutrophils 2.00 1.80 - 7.00 10*3/uL    Immature Granulocytes 0.01 0.00 - 0.05 10*3/uL    Absolute Neutrophil Comment Comment not required     Nucleated RBC 0.00 0.00 10*3/uL    % Nucleated RBC 0 %   Comprehensive Metabolic Panel   Result Value Ref Range    Sodium 141 135 - 145 meq/L    Potassium 3.6 3.6 - 5.2 meq/L    Chloride 106 98 - 108 meq/L    Carbon Dioxide, Total 30 22 - 32 meq/L    Anion Gap 5 4 - 12    Glucose 72 62 - 125 mg/dL    Urea Nitrogen 12 8 - 21 mg/dL    Creatinine 0.72 0.38 - 1.02 mg/dL    Protein (Total) 6.8 6.0 - 8.2 g/dL    Albumin 4.5 3.5 - 5.2 g/dL    Bilirubin (Total) 0.3 0.2 - 1.3 mg/dL    Calcium 9.4 8.9 - 10.2 mg/dL    AST (GOT) 21 9 - 38 U/L    Alkaline Phosphatase (Total) 46 34 - 121 U/L    ALT (GPT) 18 7 - 33 U/L    eGFR by CKD-EPI 2021 >60 >59 mL/min/1.73_m2     Results for orders placed or performed in visit on 01/30/20   Lipid Panel   Result Value Ref Range    Total Cholesterol 165 <200 mg/dL    Triglyceride 106 <150 mg/dL    HDL Cholesterol 63 >39 mg/dL    Cholesterol (LDL) 81 <130 mg/dL    Non-HDL Cholesterol 102 0 - 159 mg/dL    Cholesterol/HDL  Ratio 2.6     Lipid Panel, Additional Info. (NOTE)          Imaging:    EKG:01/11/2020:  Normal sinus rhythm, heart rate of 74 bpm, normal PR interval, premature ventricular contractions, LAE     cardiopulmonary exercise test on 05/30/2020:  Max VO2 15 ml/kg/min (66%) , RER 1.21, normal blood pressure response to exercise, normal heart rate response, O2 pulse of 82%, 30% MVV     Echocardiogram   07/29/2020:  Normal LV size and functoin EF of 61% with GLS of -19%, normal RV size and function, minimal MR..  EF has improved from 12/2019.    08/21/21:   Results for orders placed or performed in visit on 08/21/21   TransTHORACIC echo (TTE) complete   Result Value Ref Range    E/E' ratio 6     EF 59.9 %    EF - 3D Echo 04.5 %    LV Systolic Volume (BP) 40.9 ml    IVSd 0.83 cm    LVIDd 5 cm    LVIDs 3.4 cm    LVPWd 0.76 cm    RV Free wall pk S' 9.7 cm/sec  RVDd 3.8 cm    Ascending aorta 3 cm    AoV max 117.5 cm/sec    Narrative    Conclusion  Normal left ventricular size and thickness. Normal LV systolic function (LVEF  11%). 3D LVEF 59%. Mildly abnormal average GLS -15%.     Normal right ventricular size and systolic function.     Normal valvular structure and function.     Atrial septal aneurism is present. Cannot exclude PFO.    Unable to estimate PASP due to inadequate TR.     No pericardial effusion.     Compared to prior study on 08/08/2020, no significant difference. GLS  obtained on current study is lower in comparison but may be attributed to  software differences. Post processed GLS on [employer] is -19% (normal).                Holter monitor   07/29/2020:  Predominant rhythm: NSR   Atrial Tachycardia (AT) 13 episodes, Longest 5 beats @ Avg  83 bpm up to 103 bpm, Fastest 4 beats @ Avg 143 bpm up to  177 bpm   Accelerated Idioventricular Rhythm (AIVR)   PAC 0.03 %   PVC 0.11 %     06/25/2021:  Predominant rhythm: NSR   Atrial Tachycardia (AT) 9 episodes, Longest 6 beats @ Avg  116 bpm up to 137 bpm, Fastest 4 beats @ Avg  157 bpm up to  209 bpm   Ectopic Atrial Rhythm (EAR)   PAC 0.03 %   PVC 0.04 %    GDMT:  ACEI/ARB/ARNI: valsartan 44m BID  Beta-blockers: toprol XL 12.57mdaily  MRAs:  none  SGLT2 inhibitor: none  Diuretics: none  Statin: none  Digoxin: none    Assessment:   [age]ear old female with history of low normal cardiomyopathy following chemotherapy for high-grade infiltrating ductal carcinoma of the left breast (ER+/PR+, FISH +) with angiolymphatic invasion, history of neoadjuvant chemotherapy with ddAC-THP, left mastectomy with lymph node biopsy, received Herceptin and Perjeta (completed 4/6 cycles due to decrement in EF), s/p PVC ablation without LGE on cMRI with improvement in functional capacity and EF.      She is doing well since her last clinic visit with improvement in anxiety control.  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  No changes to her medication regimen today.    Plan:  Continue with GDMT as noted above.  Consider adding SGLT2 inhibitor if her follow-up echocardiogram still shows slightly lower GLS  Continue current level of activity and exercise.  Continue to watch for any symptoms change including palpitations.    Follow-up:  Follow-up in 6 months.   Patient will call sooner if symptoms change.    I spent a total of 34 minutes for the patient's care on the date of the service.  This includes the time reviewing the chart, time with the patient in the exam room,  and time completing the note.     ElJuanetta BeetsMD  Division of Cardiology  Advanced Heart Failure/Cardiac Transplantation  02/11/2022

## 2022-02-12 ENCOUNTER — Telehealth (HOSPITAL_BASED_OUTPATIENT_CLINIC_OR_DEPARTMENT_OTHER): Payer: Self-pay | Admitting: Cardiovascular Disease

## 2022-02-12 NOTE — Telephone Encounter (Signed)
Left voicemail requesting patient call to schedule their 6 mo fu with Dr. [name]. Patient is due for follow up around 08/14/22.    Sending letter/ MyChart message.

## 2022-02-15 MED ORDER — POTASSIUM CHLORIDE ER 10 MEQ OR TBCR
10.0000 meq | EXTENDED_RELEASE_TABLET | Freq: Every day | ORAL | 3 refills | Status: DC
Start: 2022-02-15 — End: 2022-04-08

## 2022-02-15 NOTE — Telephone Encounter (Signed)
Labs ordered in June but not drawn

## 2022-03-15 ENCOUNTER — Other Ambulatory Visit (HOSPITAL_BASED_OUTPATIENT_CLINIC_OR_DEPARTMENT_OTHER): Payer: Self-pay | Admitting: Cardiovascular Disease

## 2022-03-15 DIAGNOSIS — I429 Cardiomyopathy, unspecified: Secondary | ICD-10-CM

## 2022-03-17 MED ORDER — VALSARTAN 40 MG OR TABS
ORAL_TABLET | ORAL | 1 refills | Status: DC
Start: 2022-03-17 — End: 2022-09-14

## 2022-03-30 ENCOUNTER — Other Ambulatory Visit (HOSPITAL_BASED_OUTPATIENT_CLINIC_OR_DEPARTMENT_OTHER): Payer: Self-pay | Admitting: Physician Assistant

## 2022-03-30 ENCOUNTER — Encounter (HOSPITAL_BASED_OUTPATIENT_CLINIC_OR_DEPARTMENT_OTHER): Payer: Self-pay | Admitting: Medical Oncology

## 2022-03-30 ENCOUNTER — Encounter (HOSPITAL_BASED_OUTPATIENT_CLINIC_OR_DEPARTMENT_OTHER): Payer: Self-pay | Admitting: Cardiovascular Disease

## 2022-03-30 DIAGNOSIS — I427 Cardiomyopathy due to drug and external agent: Secondary | ICD-10-CM

## 2022-03-30 DIAGNOSIS — Z170 Estrogen receptor positive status [ER+]: Secondary | ICD-10-CM

## 2022-04-01 NOTE — Telephone Encounter (Signed)
Patient reporting new symptoms of lightheadedness, issues taking deep breaths x 3 weeks ago. Routing to provider for input/recommendations.

## 2022-04-02 MED ORDER — FUROSEMIDE 20 MG OR TABS
20.0000 mg | ORAL_TABLET | Freq: Every day | ORAL | 3 refills | Status: DC
Start: 2022-04-02 — End: 2023-10-20

## 2022-04-02 NOTE — Telephone Encounter (Signed)
Received message from front desk that patient had called regarding increased shortness of breath.    Weight: 159 patient has put 4lbs in the past two months  BP: 129/82  HR: 92  Dizziness/lightheadedness:more lightheadedness  SOB: hard to take deep breaths, comes and goes throughout the day with no relation to activity.   Swelling arms,legs,abdomen: noticing abdomen a little swollen and left arm little swollen.  When did it start:3 weeks ago while traveling but has gotten worse since being home.  Cough dry/wet:dry cough every day midday  Palpitations:heart racing, palpitations.  Chest pain/tightness:yes with deep breath, poking feeling, pinching a 2-3 times a day. Not able to pin point a time.   Intensity: 5/10  N/V: nausea  Stress at home.Son has been sick. Did not taken metoprolol this morning. She took while on the phone. Patient to call back if still unrelieved. Epic chat sent to Dr [name]. Patient to go to hospital if symptoms remain over the weekend.     Spoke with Dr [name] she would like patient to start on lasix '20mg'$  daily and see a provider next week. Sam PA agreed to see patient on  8/17 at noon. Patient called to update. Patient confirmed appt. Patient's palpitations and nausea has resolved since taking metoprolol. Pt to go to ED over the weekend if chest pain does not resolve. Rx pended to Dr [name]. Will do check in Monday regarding symptoms.

## 2022-04-06 NOTE — Telephone Encounter (Signed)
Called patient to check in LVM. Patient to call back.

## 2022-04-07 NOTE — Telephone Encounter (Addendum)
Patient LVM last evening stating she has not been feeling better and will call tomorrow morning when she wakes up.       Called patient. Patient woke up just a little bit ago. Patient felt very terrible yesterday and almost went to the ED last night. Patient having a hard time taking a deep breath and nauseas. Patient feeling better today. She starting taking furosemide last Friday and felt better for a few days but now back where she was. Patient is not sure if the heat is making it more difficult. Yesterday BP 125/68 HR 78. Chest pain/pinching has improved some and less frequent since starting furosemide. Patient instructed to stay cool as much as possible. Patient to get labs tomorrow before appt. Patient agreeable to plan.

## 2022-04-07 NOTE — Progress Notes (Signed)
ADVANCED HEART FAILURE / HEART TRANSPLANT CLINIC NOTE    Date of Visit: 04/08/2022     Reason for follow-up: heart failure follow up    History of Present Illness  Yolanda Bowers is a 51 year old female with a past medical history significant for 51 year old female with history of high-grade infiltrating ductal carcinoma of the left breast (ER+/PR+, FISH +) with angiolymphatic invasion, history of neoadjuvant chemotherapy with ddAC-THP, left mastectomy with lymph node biopsy, received 4 or 6 cycles of herceptin and Perjeta due to drop in EF, on letrozole, history of PVC ablation (03/21/2020) who presents to clinic for follow up.     Patient was last seen in the clinic on 01/2022 with Dr. [name] where she reported feeling well. Rare palpitations induced by caffeine or increased fatigue. More naps before doing chores. Exercising with water aerobics. Improved control of her anxiety.     She recently did a lot of traveling between [place], [place], and [place] this summer. During her hikes in [place], she started to notice more SOB and fatigue. Having trouble taking deep breaths and some lightheadedness that is intermittent and not related to activities. She gained about 4 lbs in 2 months. Noticed she is coughing more. Advised to start Lasix 20 mg daily by Dr. [name] and lost 4 lbs. Weight now ~155 lbs. Noticed less abdominal bloating with this. Stressors at home with her husband. Appetite low. Having hot flashes. COVID-19 testing normal. Blood pressures 120s/68s and HR 65-68. No fevers/chills. Also endorsing palpitations.       Past Medical History:   Diagnosis Date    Chemotherapy adverse reaction 06/25/2019    Allergic rhinitis due to allergen     Anemia     Anxiety     Breast cancer (HCC)     Burn     Cancer ([place])     Congestive heart failure (HCC)     Headache        Allergies  Review of patient's allergies indicates:  Allergies   Allergen Reactions    Oxycodone Skin: Itching    Codeine JI:RCVELF/[id number]     Lisinopril Resp: Cough    Walnut Skin: Rash    Paclitaxel Skin: Hives       Current Medications:  Current Outpatient Medications   Medication Sig Dispense Refill    acetaminophen 500 MG tablet Take 1-2 tabs by mouth once daily as needed for pain.      aspirin 81 MG EC tablet Take 1 tablet (81 mg) by mouth.      cetirizine 10 MG tablet Take by mouth.      famotidine 20 MG tablet Take 1 tablet (20 mg) by mouth as needed.      furosemide 20 MG tablet Take 1 tablet (20 mg) by mouth daily. 90 tablet 3    letrozole 2.5 MG tablet Take 1 tablet (2.5 mg) by mouth daily. 90 tablet 3    methocarbamol 500 MG tablet Take 1 tablet (500 mg) by mouth at bedtime as needed for muscle spasms. 30 tablet 2    metoprolol succinate ER 25 MG 24 hr tablet Take 1 tablet (25 mg) by mouth daily. Do not chew or crush. 90 tablet 3    potassium chloride ER 10 MEQ ER tablet Take 1 tablet (10 mEq) by mouth daily. 90 tablet 3    valsartan 40 MG tablet Take 1 tablet (40 mg) by mouth 2 times daily 180 tablet 1    venlafaxine ER  75 MG 24 hr capsule Take 1 capsule (75 mg) by mouth every morning. 90 capsule 0     No current facility-administered medications for this visit.       Family History:  Her family history includes Alcohol/Drug in her maternal grandfather; Diabetes in her mother; Hearing Loss in her father and mother; Obesity in her maternal grandfather and mother; Ovarian Cancer (age of onset: 81) in her paternal aunt; Skin Cancer in her father and maternal grandfather.    Social History:  She reports that she has never smoked. She has never used smokeless tobacco. She reports that she does not currently use alcohol. She reports that she does not currently use drugs.      Review of Systems:    Otherwise negative except as noted in history of present illness and past medical history.    Physical Exam:  BP 104/73   Pulse 79   Temp (!) 35.8 C (Temporal)   Ht '5\' 6"'$  (1.676 m)   Wt 70.7 kg (155 lb 12.8 oz)   SpO2 98%   BMI 25.15 kg/m   Wt  Readings from Last 3 Encounters:   04/08/22 70.7 kg (155 lb 12.8 oz)   02/11/22 72.8 kg (160 lb 8 oz)   12/09/21 73.3 kg (161 lb 11.2 oz)     GENERAL: NAD   HEENT: Sclerae anicteric. Mucous membranes moist.   CARDIOVASCULAR: RRR. Normal S1, S2. No M/R/G. 2+ radial pulses. JVP normal  RESPIRATORY: Normal work of breathing. CTAB.   GI: Abdomen soft, nontender, not distended.   EXT: No edema. Warm.   SKIN: No rashes or bruising.   NEURO: Grossly intact. Conversational.      Labs:  Chemistries  CBC  LFT  Gases, other   139 101 14 66   13.1   AST: 18 ALT: 16  -/-/-/-  -/-/-/-   3.5 32 0.80   4.05 >< 203  AP: 47 T bili: 0.4  Lact (a): - Lact (v): -   eGFR: >60 Ca: 9.6   39   Prot: 7.2 Alb: 4.5  Trop I: - D-dimer: -   Mg: 1.9 PO4: -  ANC: 2.58     BNP: 27 Anti-Xa: -     ALC: 1.02    INR: -        Results for orders placed or performed in visit on 04/08/22   Magnesium   Result Value Ref Range    Magnesium 1.9 1.8 - 2.4 mg/dL     WBC   Date Value Ref Range Status   12/09/2021 3.19 (L) 4.3 - 10.0 10*3/uL Final   08/21/2021 3.62 (L) 4.3 - 10.0 10*3/uL Final   07/31/2021 2.77 (L) 4.3 - 10.0 10*3/uL Final     B_Type Natriuretic Peptide   Date Value Ref Range Status   06/25/2021 13 <101 pg/mL Final   03/18/2020 31 <101 pg/mL Final   01/11/2020 10 <101 pg/mL Final     Results for orders placed or performed in visit on 02/11/22   Lipid Panel   Result Value Ref Range    Total Cholesterol 201 (H) <200 mg/dL    Triglyceride 161 (H) <150 mg/dL    HDL Cholesterol 85 >39 mg/dL    Non-HDL Cholesterol 116 0 - 159 mg/dL    LDL Cholesterol, NIH Equation 89 <130 mg/dL    Cholesterol/HDL Ratio 2.4     Lipid Panel, Additional Info. (NOTE)      Imaging:    Coronary angiogram 03/20/2020:  Angiographically normal epicardial coronary arteries with mildly sluggish flow that is still TIMI 3. Ventricular trigeminy throughout the case.     Continue primary ASCVD prevention.     TRANSTHORACIC ECHO (TTE) COMPLETE 08/21/2021  Normal left ventricular size  and thickness. Normal LV systolic function (LVEF  95%). 3D LVEF 59%. Mildly abnormal average GLS -15%.    Normal right ventricular size and systolic function.    Normal valvular structure and function.    Atrial septal aneurism is present. Cannot exclude PFO.    Unable to estimate PASP due to inadequate TR.    No pericardial effusion.    Compared to prior study on 08/08/2020, no significant difference. GLS  obtained on current study is lower in comparison but may be attributed to  software differences. Post processed GLS on [employer] is -19% (normal).    Impression/Plan:  Faithann Darlisha Kelm is a 51 year old female with a past medical history significant for 51 year old female with history of high-grade infiltrating ductal carcinoma of the left breast (ER+/PR+, FISH +) with angiolymphatic invasion, history of neoadjuvant chemotherapy with ddAC-THP, left mastectomy with lymph node biopsy, received 4 or 6 cycles of herceptin and Perjeta due to drop in EF, on letrozole, history of PVC ablation (03/21/2020) who presents to clinic for follow up.     NICM (chemo-induced) with recovery in EF: today, she appears euvolemic on exam. Her weight is ~155 lbs. She has been having lightheadedness, fatigue, palpitations and cough. No PND/orthopnea. Started Lasix 20 mg daily with improvement in abdominal bloating and lost a couple lbs. Weight now ~155 lbs. BNP is normal.     Sent her home with 7 day patch monitor given her palpitations and hx of PVC ablation  Counseled her to take Metop Succ 25 mg at bedtime to see if she has more energy/less lightheadedness during the day  Ordered US LE to r/o DVT given her recent multiple trips across country/[place] with lower suspicion given she is not tachycardic or hypoxic  Increase Kcl from 10 mEq to 30 mEq    Preload: Lasix 20 mg daily   Afterload: Valsartan 40 mg twice daily   Beta blocker: Metop Succ 25 mg daily --> start taking this at bedtime  MRA: to consider adding and stopping  Lasix  SGTL2i: to consider adding and stopping Lasix     Hypokalemia, hx of PVC ablation: K supplementation from 10 mEq to 30 mEq and encouraged increased intake of K rich foods. Could be contributing to her palpitations. Send home with 7 day patch monitor.     On the date of service I spent >40 minutes reviewing the patient's chart, evaluating the patient, counseling the patient, reviewing labs, coordinating care and documenting in the chart.    Follow-up:  Follow-up with Dr. [name] in 09/07/2022.  Patient will call sooner if symptoms change.       Kayren Eaves, PA-C  Division of Cardiology  Advanced Heart Failure/Cardiac Transplantation  04/08/2022

## 2022-04-08 ENCOUNTER — Encounter (HOSPITAL_BASED_OUTPATIENT_CLINIC_OR_DEPARTMENT_OTHER): Payer: Self-pay | Admitting: Medical

## 2022-04-08 ENCOUNTER — Ambulatory Visit (HOSPITAL_COMMUNITY): Payer: Self-pay

## 2022-04-08 ENCOUNTER — Ambulatory Visit (HOSPITAL_BASED_OUTPATIENT_CLINIC_OR_DEPARTMENT_OTHER): Payer: 59 | Admitting: Medical

## 2022-04-08 ENCOUNTER — Ambulatory Visit
Admission: RE | Admit: 2022-04-08 | Discharge: 2022-04-08 | Disposition: A | Discharge status: Home / Self Care | Payer: 59 | Attending: Medical | Admitting: Medical

## 2022-04-08 ENCOUNTER — Other Ambulatory Visit (HOSPITAL_BASED_OUTPATIENT_CLINIC_OR_DEPARTMENT_OTHER): Payer: Self-pay | Admitting: Medical

## 2022-04-08 VITALS — BP 104/73 | HR 79 | Temp 96.4°F | Ht 66.0 in | Wt 155.8 lb

## 2022-04-08 DIAGNOSIS — T451X5A Adverse effect of antineoplastic and immunosuppressive drugs, initial encounter: Secondary | ICD-10-CM

## 2022-04-08 DIAGNOSIS — I427 Cardiomyopathy due to drug and external agent: Secondary | ICD-10-CM | POA: Insufficient documentation

## 2022-04-08 DIAGNOSIS — E876 Hypokalemia: Secondary | ICD-10-CM

## 2022-04-08 LAB — B_TYPE NATRIURETIC PEPTIDE: B_Type Natriuretic Peptide: 27 pg/mL (ref <101)

## 2022-04-08 LAB — CBC, DIFF
% Basophils: 1 %
% Eosinophils: 3 %
% Immature Granulocytes: 1 %
% Lymphocytes: 25 %
% Monocytes: 7 %
% Neutrophils: 63 %
% Nucleated RBC: 0 %
Absolute Eosinophil Count: 0.11 10*3/uL (ref 0.00–0.50)
Absolute Lymphocyte Count: 1.02 10*3/uL (ref 1.00–4.80)
Basophils: 0.03 10*3/uL (ref 0.00–0.20)
Hematocrit: 39 % (ref 36.0–45.0)
Hemoglobin: 13.1 g/dL (ref 11.5–15.5)
Immature Granulocytes: 0.02 10*3/uL (ref 0.00–0.05)
MCH: 29.9 pg (ref 27.3–33.6)
MCHC: 33.5 g/dL (ref 32.2–36.5)
MCV: 89 fL (ref 81–98)
Monocytes: 0.29 10*3/uL (ref 0.00–0.80)
Neutrophils: 2.58 10*3/uL (ref 1.80–7.00)
Nucleated RBC: 0 10*3/uL
Platelet Count: 203 10*3/uL (ref 150–400)
RBC: 4.38 10*6/uL (ref 3.80–5.00)
RDW-CV: 12.6 % (ref 11.0–14.5)
WBC: 4.05 10*3/uL — ABNORMAL LOW (ref 4.3–10.0)

## 2022-04-08 LAB — LIPID PANEL
Cholesterol/HDL Ratio: 2.4
HDL Cholesterol: 85 mg/dL (ref >39)
LDL Cholesterol, NIH Equation: 89 mg/dL (ref <130)
Non-HDL Cholesterol: 116 mg/dL (ref 0–159)
Total Cholesterol: 201 mg/dL — ABNORMAL HIGH (ref <200)
Triglyceride: 161 mg/dL — ABNORMAL HIGH (ref <150)

## 2022-04-08 LAB — COMPREHENSIVE METABOLIC PANEL
ALT (GPT): 16 U/L (ref 7–33)
AST (GOT): 18 U/L (ref 9–38)
Albumin: 4.5 g/dL (ref 3.5–5.2)
Alkaline Phosphatase (Total): 47 U/L (ref 34–121)
Anion Gap: 6 (ref 4–12)
Bilirubin (Total): 0.4 mg/dL (ref 0.2–1.3)
Calcium: 9.6 mg/dL (ref 8.9–10.2)
Carbon Dioxide, Total: 32 meq/L (ref 22–32)
Chloride: 101 meq/L (ref 98–108)
Creatinine: 0.8 mg/dL (ref 0.38–1.02)
Glucose: 66 mg/dL (ref 62–125)
Potassium: 3.5 meq/L — ABNORMAL LOW (ref 3.6–5.2)
Protein (Total): 7.2 g/dL (ref 6.0–8.2)
Sodium: 139 meq/L (ref 135–145)
Urea Nitrogen: 14 mg/dL (ref 8–21)
eGFR by CKD-EPI 2021: >60 mL/min/{1.73_m2} (ref >59)

## 2022-04-08 LAB — MAGNESIUM: Magnesium: 1.9 mg/dL (ref 1.8–2.4)

## 2022-04-08 MED ORDER — POTASSIUM CHLORIDE ER 10 MEQ OR TBCR
30.0000 meq | EXTENDED_RELEASE_TABLET | Freq: Every day | ORAL | 3 refills | Status: DC
Start: 2022-04-08 — End: 2022-07-06

## 2022-04-08 NOTE — Procedure Nursing Note (Signed)
7 day CAM placed today at the [facility]. All questions/concerns answered. RETURN USPS tracking # [id number]

## 2022-04-08 NOTE — Patient Instructions (Addendum)
Start taking Metoprolol Succinate 25 mg daily at bedtime and see if this helps with lightheadedness/fatigue  Please start taking your blood pressure, weight and heart rate daily and send [place] the recordings   Please get the Ultrasound at your earliest convenience   Please make an appointment with the sleep medicine doctor when able   Restart Vitamin B12

## 2022-04-09 ENCOUNTER — Other Ambulatory Visit (HOSPITAL_BASED_OUTPATIENT_CLINIC_OR_DEPARTMENT_OTHER): Payer: Self-pay | Admitting: Medical

## 2022-04-09 ENCOUNTER — Ambulatory Visit
Admission: RE | Admit: 2022-04-09 | Discharge: 2022-04-09 | Disposition: A | Discharge status: Home / Self Care | Payer: 59 | Attending: Vascular Surgery | Admitting: Vascular Surgery

## 2022-04-09 DIAGNOSIS — T451X5A Adverse effect of antineoplastic and immunosuppressive drugs, initial encounter: Secondary | ICD-10-CM

## 2022-04-09 DIAGNOSIS — I427 Cardiomyopathy due to drug and external agent: Secondary | ICD-10-CM | POA: Insufficient documentation

## 2022-04-09 DIAGNOSIS — E876 Hypokalemia: Secondary | ICD-10-CM

## 2022-04-12 ENCOUNTER — Telehealth (HOSPITAL_BASED_OUTPATIENT_CLINIC_OR_DEPARTMENT_OTHER): Payer: Self-pay | Admitting: Nursing

## 2022-04-12 NOTE — Telephone Encounter (Signed)
Oncology Telephone Triage Note      Subjective / Visit Information:    Reason for Call: Symptom Management           Assessment and Plan:     Call Summary: Pt calls to report new symptoms per request of her cardiology team and requesting to move up her 8/28 return visit. Pt states she has been in close communication with her cardiology team and had a visit with them 8/17. She reports over the last month increased fatigue, shortness of breath, headaches, chest pain, and unproductive cough. Pt has been wearing a holter monitor since the visit and will take that off on Thursday. She is covid negative. She was also negative for any DVT. They were concerned about fluid in her heart and prescribed lasix. Her K was also low and was prescribed K supplementation as well as switched her metoprolol from taking in the morning to the evening.     RN encouraged pt to continue to follow closely with her cardiology team in regards to these symptoms. Bron[facility] team also notified for recommendations and if appropriate to move up visit. Pt verbalized understanding of plan.     Action Taken: Provider Notified         Response: Aware of existing appointments, Aware of clinic contact information, States understanding of plan, and No further questions      Additional documentation may exist in Flowsheets or Education Activity

## 2022-04-12 NOTE — Telephone Encounter (Signed)
Patient called to update. Patient still having trouble taking a deep breath and has a cough every day. Patient can feel a little lightheaded after coughing. She is still having the chest pains but less frequently maybe 1-2 times a day now. Shortness of breath and chest pain has been going on for about 3/4 weeks now. Patient takes an allergy medication. Patient also notes she has been waking up drenched in sweat. That's been going on for about 2 months. Says it is similar to when patient had a port infection and port was removed a while back. Patient denies any fever/chills. Has been feeling generally sore for about a week now. Primarily knees and hips but was attributing that to walking a lot in [place]. Denies any open area or sore with any redness,swelling or discharge. Patient is going to see her oncologist next Monday. She is going to try and move up appt.   Weight today 159lb patient scale is 3lbs more than our scale.  BP yesterday 120/76. HR 104. Right before medicine. BP has been pretty consistent. Patient states heart is still racing before she takes medication in the evening. Patient may stop her furosemide but patient to call if any weight gain,edema,or increased shortness of breath.     Routed to [place] PA to update.

## 2022-04-15 ENCOUNTER — Ambulatory Visit (HOSPITAL_BASED_OUTPATIENT_CLINIC_OR_DEPARTMENT_OTHER): Payer: 59 | Admitting: Physician Assistant

## 2022-04-15 ENCOUNTER — Ambulatory Visit: Payer: 59 | Attending: Physician Assistant

## 2022-04-15 ENCOUNTER — Encounter (HOSPITAL_BASED_OUTPATIENT_CLINIC_OR_DEPARTMENT_OTHER): Payer: Self-pay | Admitting: Physician Assistant

## 2022-04-15 VITALS — BP 107/73 | HR 64 | Temp 98.6°F | Wt 160.0 lb

## 2022-04-15 DIAGNOSIS — R5383 Other fatigue: Secondary | ICD-10-CM | POA: Insufficient documentation

## 2022-04-15 DIAGNOSIS — C50412 Malignant neoplasm of upper-outer quadrant of left female breast: Secondary | ICD-10-CM

## 2022-04-15 DIAGNOSIS — C773 Secondary and unspecified malignant neoplasm of axilla and upper limb lymph nodes: Secondary | ICD-10-CM | POA: Insufficient documentation

## 2022-04-15 DIAGNOSIS — Z170 Estrogen receptor positive status [ER+]: Secondary | ICD-10-CM

## 2022-04-15 DIAGNOSIS — R110 Nausea: Secondary | ICD-10-CM | POA: Insufficient documentation

## 2022-04-15 DIAGNOSIS — R42 Dizziness and giddiness: Secondary | ICD-10-CM | POA: Insufficient documentation

## 2022-04-15 DIAGNOSIS — F419 Anxiety disorder, unspecified: Secondary | ICD-10-CM | POA: Insufficient documentation

## 2022-04-15 DIAGNOSIS — R232 Flushing: Secondary | ICD-10-CM | POA: Insufficient documentation

## 2022-04-15 LAB — URINALYSIS COMPLETE, URN
Bacteria, URN: NONE SEEN
Bilirubin (Qual), URN: NEGATIVE
Epith Cells_Renal/Trans,URN: NEGATIVE /HPF
Epith Cells_Squamous, URN: NEGATIVE /LPF
Glucose Qual, URN: NEGATIVE
Ketones, URN: NEGATIVE
Leukocyte Esterase, URN: POSITIVE — AB
Nitrite, URN: NEGATIVE
Protein (Alb Semiquant), URN: NEGATIVE
RBC, URN: NEGATIVE /HPF
Specific Gravity, URN: 1.004 g/mL — ABNORMAL LOW (ref 1.006–1.027)
pH, URN: 6 (ref 5.0–8.0)

## 2022-04-15 LAB — CBC, DIFF
% Basophils: 1 %
% Eosinophils: 3 %
% Immature Granulocytes: 0 %
% Lymphocytes: 24 %
% Monocytes: 8 %
% Neutrophils: 64 %
% Nucleated RBC: 0 %
Absolute Eosinophil Count: 0.09 10*3/uL (ref 0.00–0.50)
Absolute Lymphocyte Count: 0.82 10*3/uL — ABNORMAL LOW (ref 1.00–4.80)
Basophils: 0.02 10*3/uL (ref 0.00–0.20)
Hematocrit: 37 % (ref 36.0–45.0)
Hemoglobin: 12.5 g/dL (ref 11.5–15.5)
Immature Granulocytes: 0 10*3/uL (ref 0.00–0.05)
MCH: 29.8 pg (ref 27.3–33.6)
MCHC: 33.4 g/dL (ref 32.2–36.5)
MCV: 89 fL (ref 81–98)
Monocytes: 0.27 10*3/uL (ref 0.00–0.80)
Neutrophils: 2.25 10*3/uL (ref 1.80–7.00)
Nucleated RBC: 0 10*3/uL
Platelet Count: 190 10*3/uL (ref 150–400)
RBC: 4.19 10*6/uL (ref 3.80–5.00)
RDW-CV: 12.5 % (ref 11.0–14.5)
WBC: 3.45 10*3/uL — ABNORMAL LOW (ref 4.3–10.0)

## 2022-04-15 LAB — COMPREHENSIVE METABOLIC PANEL
ALT (GPT): 19 U/L (ref 7–33)
AST (GOT): 27 U/L (ref 9–38)
Albumin: 4.4 g/dL (ref 3.5–5.2)
Alkaline Phosphatase (Total): 40 U/L (ref 34–121)
Anion Gap: 7 (ref 4–12)
Bilirubin (Total): 0.5 mg/dL (ref 0.2–1.3)
Calcium: 9.7 mg/dL (ref 8.9–10.2)
Carbon Dioxide, Total: 28 meq/L (ref 22–32)
Chloride: 103 meq/L (ref 98–108)
Creatinine: 0.77 mg/dL (ref 0.38–1.02)
Glucose: 82 mg/dL (ref 62–125)
Potassium: 4 meq/L (ref 3.6–5.2)
Protein (Total): 6.8 g/dL (ref 6.0–8.2)
Sodium: 138 meq/L (ref 135–145)
Urea Nitrogen: 15 mg/dL (ref 8–21)
eGFR by CKD-EPI 2021: >60 mL/min/{1.73_m2} (ref >59)

## 2022-04-15 LAB — LAB ORDER, MICROBIOLOGY

## 2022-04-18 NOTE — Progress Notes (Signed)
[facility]  BREAST ONCOLOGY CLINIC NOTE    IDENTIFICATION  Yolanda Bowers is a 51 year old female with history of cT3N3 left breast invasive ductal carcinoma, ER/PR/HER2+, s/p neoadjuvant chemotherapy with ddAC-THP, left mastectomy and SLN biopsy, and adjuvant radiation therapy. She had a complete pathologic response at the time of surgery. Presents to clinic today for labs and follow up while on letrozole.     ONCOLOGIC HISTORY     Oncology History Overview Note   10/2018: [name]   11/24/2018: Bilateral mammogram revealed a negative right breast. To the left breast UOQ, there was an 64m mass and a 2.4 cm mass laterally, 17 cm from the nipple. Targeted left breast ultrasound revealed at 3 o'clock 8 cm from the nipple, a 21x13x234mmass and an enlarged axillary lymph node measuring 12x11x1538mThere were multiple enlarged lymph nodes  11/27/2018: US-guided biopsy of the breast mass revealed invasive ductal carcinoma, ER/PR+, HER2+, grade 3. There was angiolymphatic invasion seen on biopsy. Biopsy of axillary lymph node positive for carcinoma.   12/08/2018: Bilateral breast MRI revealed right breast at 3 o'clock, 2.7cm from the nipple, a 10 x 6 x8mm79mss and at 12 o'clock, 11cm from the nipple, a spiculated 16 x 4 x 13mm73ms. Ultrasound recommended.  To the left breast, mass consistent with biopsy proved carcinoma measuring 24 x 20 x 24mm.28mre was also diffuse NME involving mid-outer breast extending over 110x45x69mm. 94min the NME, there was a small spiculated mass at 12 o'clock measuring 12mm. M57mple level 1,2 and 3 lymph nodes seen, in addition to at least 3 internal mammary nodes.   12/14/2018: PET CT without evidence of distant metastasis. There are 4mm lung75mdules bilaterally.   12/29/2018: Started on neoadjuvant chemotherapy with ddAC x4 cycles.  03/09/2019: Started on neoadjuvant chemotherapy with Taxol, Herceptin, and  Perjeta.  06/25/2019: Underwent left mastectomy and SLN biopsy with Dr. David ByrImagene Shellergy revealed a single focus of angiolymphatic involvement by carcinoma (< 0.1cm). Negative surgical margins. 0 of 5 lymph nodes positive for carcinoma. ypT0 ypN0.   09/21/2019: Completed XRT.  Started on letrozole 2.5mg PO da49m.     01/29/2020: Given drop in LVEF, last Herceptin/Perjeta infusion was discontinued. Letrozole continued.    Negative genetic panel testing, 11/2018.     Malignant neoplasm of upper-outer quadrant of left breast in female, estrogen receptor positive (HCC)   3/1Spur021 - 01/29/2020 Systemic Therapy    pertuzumab (Perjeta) 840 mg in sodium chloride 0.9 % 250 mL IVPB, Intravenous, 4 of 6 cycles  trastuzumab (Herceptin) 593 mg in sodium chloride 0.9 % 250 mL IVPB, Intravenous, 4 of 6 cycles     11/18/2019 Initial Diagnosis    Malignant neoplasm of upper-outer quadrant of left breast in female, estrogen receptor positive (HCC)      [place] INTERVAL HISTORY  Yolanda Louise RobRosea Doryseen by Dr. Linden on Charna [id number]. In the interim, she states that up until 3 weeks ago, she was feeling great. She has been traveling from the end of June until Aug 7. She was able to do lots and felt great. Then, she notes fatigue, chest pain, and heart palpitations. She continues to have the cough and notes trouble with deep breathing. She is trying to get back into an exercise routine with water aerobics and walking. She saw her cardiologist and was wearing a Holter monitor, last day today. She was found to have  a lower potassium and was given a supplement, which she thinks helped some. She has also felt lightheaded and dizzy. She has had a headache on the left side for 3 weeks. She does endorse coming home to stress, but she feels that she manages this with stretching, yoga, and the calm app. She notes a loss of appetite and gets full quickly. She endorses intermittent nausea, mostly after taking medication. She switched her  metoprolol to night which helps some. She denies any fever. A few days ago she had some urinary symptoms, resolved now. She denies any persistent bone pain. She uses a gericare supplement that keeps her bowel movements regular. She denies any new chest wall concerns. She endorses hot flashes that are daily and nightly, improved over the past few days.       ECOG Status: (0) Fully active, able to carry on all predisease performance without restriction     REVIEW OF SYSTEMS  A complete review of systems was conducted and is negative in detail, except as noted in the interval history.    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 Lowella Dell    PR MASTECTOMY SIMPLE COMPLETE Left 06/25/2019    Had radiation        ALLERGIES  Review of patient's allergies indicates:  Allergies   Allergen Reactions    Oxycodone Skin: Itching    Codeine ZO:XWRUEA/VWUJWJXB    Lisinopril Resp: Cough    Walnut Skin: Rash    Paclitaxel Skin: Hives        MEDICATIONS    Current Outpatient Medications:     acetaminophen 500 MG tablet, Take 1-2 tabs by mouth once daily as needed for pain., Disp: , Rfl:     aspirin 81 MG EC tablet, Take 1 tablet (81 mg) by mouth., Disp: , Rfl:     cetirizine 10 MG tablet, Take by mouth., Disp: , Rfl:     cyanocobalamin (Vitamin B-12) 500 MCG tablet, Take 1 tablet (500 mcg) by mouth daily., Disp: , Rfl:     famotidine 20 MG tablet, Take 1 tablet (20 mg) by mouth as needed., Disp: , Rfl:     furosemide 20 MG tablet, Take  1 tablet (20 mg) by mouth daily., Disp: 90 tablet, Rfl: 3    letrozole 2.5 MG tablet, Take 1 tablet (2.5 mg) by mouth daily., Disp: 90 tablet, Rfl: 3    methocarbamol 500 MG tablet, Take 1 tablet (500 mg) by mouth at bedtime as needed for muscle spasms., Disp: 30 tablet, Rfl: 2    metoprolol succinate ER 25 MG 24 hr tablet, Take 1 tablet (25 mg) by mouth daily. Do not chew or crush., Disp: 90 tablet, Rfl: 3    potassium chloride ER 10 MEQ ER tablet, Take 3 tablets (30 mEq) by mouth daily., Disp: 90 tablet, Rfl: 3    valsartan 40 MG tablet, Take  1 tablet (40 mg) by mouth 2 times daily, Disp: 180 tablet, Rfl: 1    venlafaxine ER 75 MG 24 hr capsule, Take 1 capsule (75 mg) by mouth every morning., Disp: 90 capsule, Rfl: 0     SOCIAL HISTORY:   Social History     Tobacco Use    Smoking status: Never    Smokeless tobacco: Never   Substance Use Topics    Alcohol use: Not Currently     Comment: occasional wine , 2 glasses a month    Drug use: Not Currently        FAMILY HISTORY:    Cancer-related family history includes Ovarian Cancer (age of onset: 36) in her paternal aunt; Skin Cancer in her father and maternal grandfather.     PHYSICAL EXAMINATION   Yolanda Bowers, Yolanda Bowers, present for exam today.     VITAL SIGNS: BP 107/73   Pulse 64   Temp 37 C (Oral)   Wt 72.6 kg (160 lb)   SpO2 98%   BMI 25.82 kg/m       GENERAL: Well appearing, well developed female in no acute distress.  HEENT: Normocephalic, atraumatic. Sclerae anicteric. Pupils are equal, round.   NECK: Neck is supple without any appreciable lymphadenopathy or thyromegaly.  LUNGS: Respirations regular and unlabored. Clear to auscultation bilaterally. No wheezing/rales/rhonci  CARDIAC: Regular rate and rhythm. No murmurs, gallops, or rubs.   BREASTS: Deferred  ABDOMEN: Soft, non-tender, non-distended. Normoactive bowel sounds. No hepatosplenomegaly or masses appreciated.  EXTREMITIES: No edema, clubbing, or cyanosis.  SKIN: No visible rash, bruising, or  petechiae.  MSK: Without muscle or joint deformities. No spinal or hip tenderness to palpation.   NEUROLOGICAL: Alert and oriented. Mental status appropriate.   NUTRITIONAL STATUS: Adequate.      Office Visit on 04/15/2022   Component Date Value    Color, URN 04/15/2022 Yellow     Clarity, URN 04/15/2022 Clear     Specific Gravity, URN 04/15/2022 1.004 (L)     pH, URN 04/15/2022 6.0     Protein (Alb Semiquant),* 04/15/2022 Negative     Glucose Qual, URN 04/15/2022 Negative     Ketones, URN 04/15/2022 Negative     Bilirubin (Qual), URN 04/15/2022 Negative     Occult Blood, URN 04/15/2022 1+ (A)     Nitrite, URN 04/15/2022 Negative     Leukocyte Esterase, URN 04/15/2022 Positive (A)     Urobilinogen, URN 04/15/2022 0.1-1.9     Comments for Macroscopic* 04/15/2022 None     WBC, URN 04/15/2022 6-10(1+) (A)     RBC, URN 04/15/2022 0-2(NEG)     Bacteria, URN 04/15/2022 Not Seen     Epith Cells_Squamous, URN 04/15/2022 0-5(NEG)     Epith Cells_Renal/Trans,* 04/15/2022 <3(NEG)     Comments For Microscopic* 04/15/2022 None     Mucus, URN 04/15/2022 Present (A)     Lab Test Requested 04/15/2022 culture and sensitivity     Specimen Type/Description 04/15/2022 Urine     Sample To Use 04/15/2022 urine 8/24     Test Request Status 04/15/2022 Order Processed     Special Requests 04/15/2022 Sensitivity     Culture 04/15/2022  (A)                     Value:>100,000 Col/mL  Beta hemolytic Streptococcus, Group B     Lab on 04/15/2022   Component Date Value    WBC 04/15/2022 3.45 (L)     RBC 04/15/2022 4.19  Hemoglobin 04/15/2022 12.5     Hematocrit 04/15/2022 37     MCV 04/15/2022 89     MCH 04/15/2022 29.8     MCHC 04/15/2022 33.4     Platelet Count 04/15/2022 190     RDW-CV 04/15/2022 12.5     % Neutrophils 04/15/2022 64     % Lymphocytes 04/15/2022 24     % Monocytes 04/15/2022 8     % Eosinophils 04/15/2022 3     % Basophils 04/15/2022 1     % Immature Granulocytes 04/15/2022 0     Neutrophils 04/15/2022 2.25     Absolute  Lymphocyte Count 04/15/2022 0.82 (L)     Monocytes 04/15/2022 0.27     Absolute Eosinophil Count 04/15/2022 0.09     Basophils 04/15/2022 0.02     Immature Granulocytes 04/15/2022 0.00     Nucleated RBC 04/15/2022 0.00     % Nucleated RBC 04/15/2022 0     Sodium 04/15/2022 138     Potassium 04/15/2022 4.0     Chloride 04/15/2022 103     Carbon Dioxide, Total 04/15/2022 28     Anion Gap 04/15/2022 7     Glucose 04/15/2022 82     Urea Nitrogen 04/15/2022 15     Creatinine 04/15/2022 0.77     Protein (Total) 04/15/2022 6.8     Albumin 04/15/2022 4.4     Bilirubin (Total) 04/15/2022 0.5     Calcium 04/15/2022 9.7     AST (GOT) 04/15/2022 27     Alkaline Phosphatase (To* 04/15/2022 40     ALT (GPT) 04/15/2022 19     eGFR by CKD-EPI 2021 04/15/2022 >60        Results for orders placed or performed in visit on 04/15/22   Urinalysis Complete   Result Value Ref Range    Color, URN Yellow     Clarity, URN Clear     Specific Gravity, URN 1.004 (L) 1.006 - 1.027 g/mL    pH, URN 6.0 5.0 - 8.0    Protein (Alb Semiquant), URN Negative NRN    Glucose Qual, URN Negative NRN    Ketones, URN Negative NRN    Bilirubin (Qual), URN Negative NRN    Occult Blood, URN 1+ (A) NRN    Nitrite, URN Negative NRN    Leukocyte Esterase, URN Positive (A) NRN    Urobilinogen, URN 3.2-6.7 URONML [Ehrlich'U]    Comments for Macroscopic, URN None     WBC, URN 6-10(1+) (A) Z5NEG /[HPF]    RBC, URN 0-2(NEG) Z2NEG /[HPF]    Bacteria, URN Not Seen NOSEEN    Epith Cells_Squamous, URN 0-5(NEG) LT6 /[LPF]    Epith Cells_Renal/Trans,URN <3(NEG) TIWP8 /[HPF]    Comments For Microscopic, URN None NONE    Mucus, URN Present (A) NOSEEN /[LPF]   Lab Order, Microbiology   Result Value Ref Range    Lab Test Requested culture and sensitivity     Specimen Type/Description Urine     Sample To Use urine 8/24     Test Request Status Order Processed    Culture Urine, Bact    Specimen: Urine   Result Value Ref Range    Special Requests Sensitivity     Culture (A)       >100,000 Col/mL  Beta hemolytic Streptococcus, Group B           ASSESSMENT/PLAN  Yolanda Bowers is a 51 year old female with history of cT3N3 left breast invasive ductal carcinoma,  ER/PR/HER2+, s/p neoadjuvant chemotherapy with ddAC-THP, left mastectomy and SLN biopsy, and adjuvant radiation therapy. She had a complete pathologic response at the time of surgery. Presents to clinic today for labs and follow up while on adjuvant letrozole.    1. Malignant neoplasm of upper-outer quadrant of left breast in female, estrogen receptor positive ([place])  Yunique has been doing well but notes 3 weeks of feeling unwell. Cardiologist working up heart palpitations and chest pain with Holter monitor, just removed. Consider echocardiogram if monitor unrevealing. She continues to have a cough. Last CT Chest showed concerns for radiation changes vs pneumonitis. Discussed another follow up of this now given her symptoms.   Given her headaches, dizziness, and nausea, I am also recommending a brain MRI given her history of HER2+ disease, although suspicion is low.   Given early satiety, nausea, will also get a CT AP. If negative, continue to follow up with cardiology and PCP. This may all be secondary to overexertion with travel.   Kamy is tolerating letrozole well overall without significant side effects. Continue for goal of 5 years.  She notes a few days ago, having urinary symptoms. We discussed getting a UA to rule out a UTI, which may be contributing to some of her symptoms as well.   -R mammogram done at [facility], repeat 05/2022.      Psych:    Terin notes worsening anxiety. She is working with psych and encouraged her to reach out if she has any concerns or would liek a SW referral here.   -Patient working with counselor at [employer], continued to encourage reaching out if things worsen.     Survivorship:  - Counseled on regular exercise and dietary measures to reduce risk of recurrence.    Orders Placed This Encounter    CT  Chest w Contrast    CT Abdomen And Pelvis w Contrast    MRI Brain w wo Contrast    Urinalysis Complete    Lab Order, Microbiology    CBC with Differential    Comprehensive Metabolic Panel    Urine C/S with Gram    Culture Urine, Bact    Clinic Visit Physician      DISPOSITION  Patient to return to clinic in 4-6 months for follow up and surveillance, or sooner if needed. Patient encouraged to contact clinic in the interim with any questions or concerns.    TIME STATEMENT: I spent 45 minutes in total for patient's care today, including preparation, time with patient, and charting.

## 2022-04-19 ENCOUNTER — Ambulatory Visit (HOSPITAL_BASED_OUTPATIENT_CLINIC_OR_DEPARTMENT_OTHER): Payer: 59

## 2022-04-19 ENCOUNTER — Ambulatory Visit: Payer: 59 | Admitting: Physician Assistant

## 2022-04-19 ENCOUNTER — Encounter (HOSPITAL_BASED_OUTPATIENT_CLINIC_OR_DEPARTMENT_OTHER): Payer: Self-pay | Admitting: Cardiovascular Disease

## 2022-04-19 ENCOUNTER — Encounter (HOSPITAL_BASED_OUTPATIENT_CLINIC_OR_DEPARTMENT_OTHER): Payer: Self-pay | Admitting: Physician Assistant

## 2022-04-19 DIAGNOSIS — I429 Cardiomyopathy, unspecified: Secondary | ICD-10-CM

## 2022-04-21 ENCOUNTER — Telehealth (HOSPITAL_BASED_OUTPATIENT_CLINIC_OR_DEPARTMENT_OTHER): Payer: Self-pay | Admitting: Physician Assistant

## 2022-04-21 ENCOUNTER — Other Ambulatory Visit (HOSPITAL_BASED_OUTPATIENT_CLINIC_OR_DEPARTMENT_OTHER): Payer: Self-pay | Admitting: Physician Assistant

## 2022-04-21 DIAGNOSIS — N3000 Acute cystitis without hematuria: Secondary | ICD-10-CM

## 2022-04-21 DIAGNOSIS — Z170 Estrogen receptor positive status [ER+]: Secondary | ICD-10-CM

## 2022-04-21 DIAGNOSIS — N390 Urinary tract infection, site not specified: Secondary | ICD-10-CM

## 2022-04-21 LAB — URINE C/S: Culture: >100000 — AB

## 2022-04-21 MED ORDER — CEFDINIR 300 MG OR CAPS
300.0000 mg | ORAL_CAPSULE | Freq: Two times a day (BID) | ORAL | 0 refills | Status: AC
Start: 2022-04-21 — End: 2022-04-26

## 2022-04-21 NOTE — Telephone Encounter (Signed)
Called Yolanda Bowers to review her UA results. No answer, MyChart message sent instead

## 2022-04-22 DIAGNOSIS — T451X5A Adverse effect of antineoplastic and immunosuppressive drugs, initial encounter: Secondary | ICD-10-CM

## 2022-04-22 DIAGNOSIS — I427 Cardiomyopathy due to drug and external agent: Secondary | ICD-10-CM

## 2022-04-23 NOTE — Addendum Note (Signed)
Encounter addended by: Kayren Eaves, PA-C on: 04/23/2022 9:41 AM   Actions taken: Result note filed

## 2022-04-27 MED ORDER — METOPROLOL SUCCINATE ER 25 MG OR TB24
EXTENDED_RELEASE_TABLET | ORAL | 3 refills | Status: DC
Start: 2022-04-27 — End: 2023-05-03

## 2022-04-27 NOTE — Addendum Note (Signed)
Addended by: Annabell Sabal on: 04/27/2022 04:17 PM     Modules accepted: Orders

## 2022-04-27 NOTE — Telephone Encounter (Signed)
Patient called to update. Patient agreeable to plan. Patient also sent mychart message regarding weight gain and and bloating in abdomen and shortness of breath. Patient restarted lasix. Instructed patient to monitor weights in the morning for more accurate assessment. Patient to call with weight gain 3lbs in one day or 5 in a week. Patient to take lasix if waking up feeling bloating or 3lb gain in one day and call. Patient did state her UA done at her oncologist showed an infection. She just finished her last dose of 5-6 day antibiotic course. She noticed today she has been feeling much better. Her breathing is much better and feeling less bloated. Dizziness has also been a lot better. Still having the fatigue but feeling much better in general. Patient requesting mychart message with instruction so she does not forget. Routed to Dr [name] and Sam PA to update.

## 2022-04-28 ENCOUNTER — Encounter (HOSPITAL_BASED_OUTPATIENT_CLINIC_OR_DEPARTMENT_OTHER): Payer: Self-pay

## 2022-05-02 ENCOUNTER — Other Ambulatory Visit (HOSPITAL_BASED_OUTPATIENT_CLINIC_OR_DEPARTMENT_OTHER): Payer: Self-pay

## 2022-05-02 ENCOUNTER — Ambulatory Visit: Payer: 59

## 2022-05-02 ENCOUNTER — Other Ambulatory Visit: Payer: Self-pay

## 2022-05-17 ENCOUNTER — Encounter (HOSPITAL_BASED_OUTPATIENT_CLINIC_OR_DEPARTMENT_OTHER): Payer: Self-pay | Admitting: Nursing

## 2022-05-17 DIAGNOSIS — Z170 Estrogen receptor positive status [ER+]: Secondary | ICD-10-CM

## 2022-05-27 NOTE — Telephone Encounter (Addendum)
Called patient to assess LVM. Patient to call back.     Received VM from patient that she is sending over some paperwork to get time off from work. Called patient and LVM. Mychart message sent to patient with fax number. Will follow up with Dr [name].

## 2022-05-28 ENCOUNTER — Encounter (HOSPITAL_BASED_OUTPATIENT_CLINIC_OR_DEPARTMENT_OTHER): Payer: Self-pay

## 2022-06-03 ENCOUNTER — Other Ambulatory Visit (HOSPITAL_BASED_OUTPATIENT_CLINIC_OR_DEPARTMENT_OTHER): Payer: Self-pay | Admitting: Cardiovascular Disease

## 2022-06-03 DIAGNOSIS — T451X5A Adverse effect of antineoplastic and immunosuppressive drugs, initial encounter: Secondary | ICD-10-CM

## 2022-06-16 ENCOUNTER — Ambulatory Visit (INDEPENDENT_AMBULATORY_CARE_PROVIDER_SITE_OTHER): Payer: 59

## 2022-06-16 DIAGNOSIS — Z23 Encounter for immunization: Secondary | ICD-10-CM

## 2022-06-16 MED ORDER — INFLUENZA VAC SUBUNIT QUAD 0.5 ML IM SUSY
0.5000 mL | PREFILLED_SYRINGE | Freq: Once | INTRAMUSCULAR | Status: AC
Start: 2022-06-16 — End: 2022-06-16
  Administered 2022-06-16: 0.5 mL via INTRAMUSCULAR

## 2022-06-16 NOTE — Progress Notes (Signed)
Vaccine Screening Questions    Interpreter: No    1. Are you allergic to Latex? NO    2.  Have you had a serious reaction or an allergic reaction to a vaccine?  NO    3.  Currently have a moderate or severe illness, including fever?  NO    4.  Ever had a seizure or any neurological problem associated with a vaccine? (DTaP/TDaP/DTP pertinent) NO    5.  Is patient receiving any live vaccinations today? (Varicella-Chickenpox, MMR-Measles/Mumps/Rubella, Zoster-Shingles, Flumist, Yellow Fever) NOTE: oral rotavirus is exempt  NO    If YES to any of the questions above - Do NOT give vaccine.  Consult with RN or provider in clinic.  (#5 can be YES if all Live vaccine questions are answered NO)    If NO to all questions above - Patient may receive vaccine.    6. Are you pregnant or is there a chance you could become pregnant during the next month (HPV pertinent) NO    7. Do you need to receive the Flu vaccine today? YES - Additional Flu Questions  Flu Vaccine Screening Questions:    Ever had Guillain-Barre syndrome associated with a vaccine? NO    Less than 6 months old? NO    If YES to any of the Flu questions above - NO Flu Vaccine to be given.  Patient may consult provider as needed.    If NO to all questions above - Patient may receive Flu Shot (IM)    Is the patient requesting Flumist? NO    If between 6 months and 8 years of age, was flu vaccine received last year?  NO  If NO to above question:   Children who are receiving influenza vaccine for the first time - administer 2 doses of the current influenza vaccine (separated by at least 4 weeks).      All patients are encouraged to wait 15 minutes before leaving after receiving any vaccine.    VIS given 06/16/2022 by Latoya Maulding, CMA.    Vaccine given today without initial adverse effect. YES    Shalane Florendo, CMA

## 2022-06-18 ENCOUNTER — Other Ambulatory Visit (HOSPITAL_BASED_OUTPATIENT_CLINIC_OR_DEPARTMENT_OTHER): Payer: Self-pay

## 2022-06-18 ENCOUNTER — Encounter (HOSPITAL_BASED_OUTPATIENT_CLINIC_OR_DEPARTMENT_OTHER): Payer: Self-pay

## 2022-06-18 DIAGNOSIS — C50412 Malignant neoplasm of upper-outer quadrant of left female breast: Secondary | ICD-10-CM

## 2022-06-22 ENCOUNTER — Other Ambulatory Visit (HOSPITAL_BASED_OUTPATIENT_CLINIC_OR_DEPARTMENT_OTHER): Payer: Self-pay | Admitting: Physician Assistant

## 2022-06-22 ENCOUNTER — Encounter (HOSPITAL_BASED_OUTPATIENT_CLINIC_OR_DEPARTMENT_OTHER): Payer: Self-pay | Admitting: Medical Oncology

## 2022-06-22 ENCOUNTER — Encounter (HOSPITAL_BASED_OUTPATIENT_CLINIC_OR_DEPARTMENT_OTHER): Payer: Self-pay

## 2022-06-22 DIAGNOSIS — Z170 Estrogen receptor positive status [ER+]: Secondary | ICD-10-CM

## 2022-06-23 ENCOUNTER — Encounter (HOSPITAL_BASED_OUTPATIENT_CLINIC_OR_DEPARTMENT_OTHER): Payer: Self-pay

## 2022-06-29 ENCOUNTER — Telehealth (HOSPITAL_BASED_OUTPATIENT_CLINIC_OR_DEPARTMENT_OTHER): Payer: Self-pay | Admitting: Plastic Surgery

## 2022-06-29 NOTE — Telephone Encounter (Signed)
Called pt to schedule f/u appt. LVM.

## 2022-06-29 NOTE — Telephone Encounter (Signed)
-----   Message from [place]. [place] sent at 06/28/2022  4:43 PM PST -----  Regarding: Appointment Request  Contact: [phone]  Hi. I just received a referral from [facility] for an appt for breast reconstruction and I'm wondering if I could possibly see about getting in to see Dr. Lowella Dell.   Thanks,  Yolanda Bowers

## 2022-06-30 ENCOUNTER — Ambulatory Visit: Payer: 59

## 2022-06-30 DIAGNOSIS — Z170 Estrogen receptor positive status [ER+]: Secondary | ICD-10-CM

## 2022-06-30 NOTE — Progress Notes (Signed)
Yolanda Bowers NUTRITION NOTE    GENERAL INFORMATION:   Care Team:   Serita Butcher, PA-C   Medical Oncologist: Tommie Raymond, MD  Bennie Pierini, MD  Diagnosis:  [phone] left breast invasive ductal carcinoma, ER/PR/HER2+   Current treatment: letrozole  Treatment Plans       No treatment plans exist          Past Medical History:   s/p neoadjuvant chemotherapy with ddAC-THP, left mastectomy and SLN biopsy, and adjuvant radiation therapy.   Past Medical History:   Diagnosis Date    Chemotherapy adverse reaction 06/25/2019    Allergic rhinitis due to allergen     Anemia     Anxiety     Breast cancer (HCC)     Burn     Cancer [facility])     Congestive heart failure (HCC)     Headache      Distant Site Telemedicine Encounter  I conducted this encounter via secure, live, face-to-face video conference with the patient. I reviewed the risks and benefits of telemedicine as pertinent to this visit and the patient agreed to proceed.    Provider Location: On-site location (clinic, hospital, on-site office)  Patient Location: At home  Present with patient: No one else present     SUBJECTIVE:  Progress: Seen for initial nutrition visit    She reports reviewing the [facility] web site. Her PCP will check vitamin D and she has never taken vitamin D or had a vitamin D level checked,    Diet: No known food allergies and No lactose intolerance and Diet preference: No specific preference She follows a low sodium diet due to fluid on her lungs and reports needing more potassium    Typical Day:    Breakfast: 7 am low fat yogurt and berries small amount of nuts; or egg and whole wheat toast  Fig/raspberry bar if on the go; on weekends: quiche   Snack: apple and orange  Lunch: salad with vinaigrette and handful of carrots and hummus; lemon loaf cake; yogurt with muesli and berries  On weekends: homemade soup  Snack: chips and guacamole and hummus; avocado and toast, chocolate  Dinner: 8:30 fish/chicken - air  fryer, sweet potatoes and broccoli and cauliflower; whole wheat pasta sometimes bread  Snack: ice cream   Beverages: coffee, little cream, water, tea, sometimes orange juice for regularity or for flavor, rare soda, 1/2 glass wine per week  GI/Oral: She has heartburn with coffee on an empty stomach and tomato sauce. She  occasionally takes an antiacids. She has nausea after taking pills and sometimes takes compazine.   Lifestyle/Physical Activity: She reports fatigue.  She is a high school Journalist, newspaper. This includes walking and lifting. Most Wednesdays, she does a zoom team survivor dance/yoga class. On Sundays, she takes water aerobics. She has an exercise bike as well.      VITAL SIGNS:   Weight History: Weight: Stable weight   She wants to lose weight.  Wt Readings from Last 10 Encounters:   04/15/22 72.6 kg (160 lb)   04/08/22 70.7 kg (155 lb 12.8 oz)   02/11/22 72.8 kg (160 lb 8 oz)   12/09/21 73.3 kg (161 lb 11.2 oz)   08/21/21 73.4 kg (161 lb 12.8 oz)   07/31/21 73.2 kg (161 lb 6 oz)   06/25/21 72.6 kg (160 lb)   06/10/21 74.4 kg (164 lb 0.4 oz)   05/20/21 73.1 kg (161 lb 1.6  oz)   04/15/21 74.4 kg (164 lb 0.4 oz)     BMI: Estimated body mass index is 25.82 kg/m as calculated from the following:    Height as of 04/08/22: 5\' 6"  (1.676 m).    Weight as of 04/15/22: 72.6 kg (160 lb).    LABORATORY STUDIES:   Laboratory Results: Reviewed  Lab Results   Component Value Date    SODIUM 138 04/15/2022    POTASSIUM 4.0 04/15/2022    BUN 15 04/15/2022    CREATININE 0.77 04/15/2022    GLUCOSE 82 04/15/2022    MAGNESIUM 1.9 04/08/2022    CA 9.7 04/15/2022    PHOSPHATE 3.5 11/24/2020    ALBUMIN 4.4 04/15/2022      Latest Reference Range & Units 03/09/21 14:17   Vitamin B12 (Cobalamin) 180 - 914 pg/mL 330      Latest Reference Range & Units 03/09/21 14:17   Folate, SRM >5.8 ng/mL >22.0      Latest Reference Range & Units 02/16/19 07:29 05/13/20 12:43   Transferrin Saturation 10 - 45 % 19 25      MEDICATIONS:  Medications: Reviewed  Supplements: Reviewed  Current Outpatient Medications   Medication Instructions    acetaminophen 500 MG tablet Take 1-2 tabs by mouth once daily as needed for pain.    aspirin 81 mg, Oral    cetirizine 10 MG tablet Oral    cyanocobalamin (VITAMIN B-12) 500 mcg, Oral, Daily    famotidine (PEPCID) 20 mg, Oral, As needed    furosemide (LASIX) 20 mg, Oral, Daily    letrozole (FEMARA) 2.5 mg, Oral, Daily    methocarbamol (ROBAXIN) 500 mg, Oral, Nightly PRN    metoprolol succinate ER 25 MG 24 hr tablet Please take 1/2 tablet(12.5) in the am and 1 tablet (25mg ) in the pm. Do not chew or crush.    potassium chloride ER 10 MEQ ER tablet 30 mEq, Oral, Daily    valsartan 40 MG tablet Take 1 tablet (40 mg) by mouth 2 times daily    venlafaxine ER (EFFEXOR XR) 75 mg, Oral, Every morning     DIET/NUTRITION:  Diet: General    Estimated Nutrient Needs: Based on actual weight (72.6 kg)  1600 kcal (MSJ 1353 x 1.2 )  60-70 g protein (.8-1 g/kg)  1600 ml fluid (1 ml/kcal)     ASSESSMENT:    Altered Nutrition-Related Lab Values: Vitamin D unknown  Vitamin B12 borderline deficiency     ACUITY (Pt. assessed at): Moderate nutrition acuity     PLAN:    Weight/Activity: Reviewed weight loss strategies, Encouraged increased activity as tolerated  Vitamin, Mineral, Electrolyte: Electrolytes: Recommend high potassium diet  Supplements: Reviewed current supplement list and discussed current evidence-based practice regarding supplements  Other: Reviewed AICR/ACS recommendations on lifestyle and diet for cancer prevention and survivorship  Follow-up: PRN     PATIENT EDUCATION:  Person(s) Instructed: Patient  Desire and Motivation to Learn: Patient/caregiver demonstrated understanding of information provided  Special Learning Needs: None  Teaching Methods Utilized: Explanation and Printed material  Handouts Provided: Potassium 06/30/22

## 2022-07-05 ENCOUNTER — Other Ambulatory Visit (HOSPITAL_BASED_OUTPATIENT_CLINIC_OR_DEPARTMENT_OTHER): Payer: Self-pay | Admitting: Medical

## 2022-07-05 DIAGNOSIS — E876 Hypokalemia: Secondary | ICD-10-CM

## 2022-07-06 MED ORDER — POTASSIUM CHLORIDE ER 10 MEQ OR TBCR
30.0000 meq | EXTENDED_RELEASE_TABLET | Freq: Every day | ORAL | 1 refills | Status: DC
Start: 2022-07-06 — End: 2022-10-08

## 2022-07-09 ENCOUNTER — Other Ambulatory Visit (HOSPITAL_BASED_OUTPATIENT_CLINIC_OR_DEPARTMENT_OTHER): Payer: Self-pay | Admitting: Physician Assistant

## 2022-07-09 DIAGNOSIS — C50412 Malignant neoplasm of upper-outer quadrant of left female breast: Secondary | ICD-10-CM

## 2022-08-10 ENCOUNTER — Ambulatory Visit: Payer: 59 | Attending: Plastic Surgery | Admitting: Plastic Surgery

## 2022-08-10 VITALS — BP 105/71 | HR 65 | Temp 97.7°F | Ht 66.0 in | Wt 166.0 lb

## 2022-08-10 DIAGNOSIS — I427 Cardiomyopathy due to drug and external agent: Secondary | ICD-10-CM | POA: Insufficient documentation

## 2022-08-10 DIAGNOSIS — Z7189 Other specified counseling: Secondary | ICD-10-CM | POA: Insufficient documentation

## 2022-08-10 DIAGNOSIS — C50412 Malignant neoplasm of upper-outer quadrant of left female breast: Secondary | ICD-10-CM | POA: Insufficient documentation

## 2022-08-10 DIAGNOSIS — Z853 Personal history of malignant neoplasm of breast: Secondary | ICD-10-CM

## 2022-08-10 DIAGNOSIS — Z9012 Acquired absence of left breast and nipple: Secondary | ICD-10-CM | POA: Insufficient documentation

## 2022-08-10 DIAGNOSIS — Z923 Personal history of irradiation: Secondary | ICD-10-CM | POA: Insufficient documentation

## 2022-08-10 DIAGNOSIS — N651 Disproportion of reconstructed breast: Secondary | ICD-10-CM

## 2022-08-10 DIAGNOSIS — Z170 Estrogen receptor positive status [ER+]: Secondary | ICD-10-CM | POA: Insufficient documentation

## 2022-08-10 DIAGNOSIS — I428 Other cardiomyopathies: Secondary | ICD-10-CM

## 2022-08-10 DIAGNOSIS — T451X5A Adverse effect of antineoplastic and immunosuppressive drugs, initial encounter: Secondary | ICD-10-CM | POA: Insufficient documentation

## 2022-08-12 ENCOUNTER — Encounter (HOSPITAL_BASED_OUTPATIENT_CLINIC_OR_DEPARTMENT_OTHER): Payer: Self-pay

## 2022-08-13 ENCOUNTER — Encounter (HOSPITAL_BASED_OUTPATIENT_CLINIC_OR_DEPARTMENT_OTHER): Payer: Self-pay

## 2022-08-17 ENCOUNTER — Other Ambulatory Visit (HOSPITAL_BASED_OUTPATIENT_CLINIC_OR_DEPARTMENT_OTHER): Payer: Self-pay | Admitting: Physician Assistant

## 2022-08-17 ENCOUNTER — Encounter (HOSPITAL_BASED_OUTPATIENT_CLINIC_OR_DEPARTMENT_OTHER): Payer: Self-pay

## 2022-08-17 ENCOUNTER — Ambulatory Visit
Admission: RE | Admit: 2022-08-17 | Discharge: 2022-08-17 | Disposition: A | Discharge status: Home / Self Care | Payer: 59 | Attending: Diagnostic Radiology | Admitting: Diagnostic Radiology

## 2022-08-17 ENCOUNTER — Ambulatory Visit (HOSPITAL_BASED_OUTPATIENT_CLINIC_OR_DEPARTMENT_OTHER)
Admit: 2022-08-17 | Discharge: 2022-08-17 | Disposition: A | Discharge status: Home / Self Care | Payer: 59 | Source: Home / Self Care

## 2022-08-17 DIAGNOSIS — Z170 Estrogen receptor positive status [ER+]: Secondary | ICD-10-CM

## 2022-08-17 DIAGNOSIS — Z9889 Other specified postprocedural states: Secondary | ICD-10-CM | POA: Insufficient documentation

## 2022-08-17 DIAGNOSIS — R922 Inconclusive mammogram: Secondary | ICD-10-CM | POA: Insufficient documentation

## 2022-08-17 DIAGNOSIS — N644 Mastodynia: Secondary | ICD-10-CM

## 2022-08-17 DIAGNOSIS — N6459 Other signs and symptoms in breast: Secondary | ICD-10-CM | POA: Insufficient documentation

## 2022-08-17 DIAGNOSIS — C50412 Malignant neoplasm of upper-outer quadrant of left female breast: Secondary | ICD-10-CM | POA: Insufficient documentation

## 2022-08-17 HISTORY — DX: Personal history of irradiation: Z92.3

## 2022-08-20 DIAGNOSIS — Z7189 Other specified counseling: Secondary | ICD-10-CM | POA: Insufficient documentation

## 2022-08-20 DIAGNOSIS — Z9012 Acquired absence of left breast and nipple: Secondary | ICD-10-CM | POA: Insufficient documentation

## 2022-08-20 DIAGNOSIS — Z923 Personal history of irradiation: Secondary | ICD-10-CM | POA: Insufficient documentation

## 2022-08-20 NOTE — Progress Notes (Signed)
[facility]  PLASTIC AND RECONSTRUCTIVE SURGERY  FOLLOW UP VISIT    Identification: Yolanda Bowers is a 51 year old female with hx of triple negative left invasive ductal carcinoma and macromastia who underwent left simple mastectomy and L SLNB with R breast reduction with inferior pedicle and wise pattern skin excision on 06/25/19.     Interval History:   Yolanda Bowers was last seen in clinic Jan 2021.  She completed her PT exercises to improve her range of motion and is back to working as Brewing technologist.  She can throw clay on her potters wheel that is better heavy.  It took a while to get back to that level of activity.     She completed radiation on 2/21 and reports doing fairly well with radiation.  She did 5 total weeks and denies any episodes of left UE cellulitis or swelling.  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  She underwent a cardiac ablation July 2021.  She had a dropped in her EF which has since recovered.  Stable on her lasix, metoprolol and valsartan.    She is happy with the size of her right breast reduction.  She feels like the size fits her body well and the appearance is good.  She has been wearing a left breast prosthetic but reports that it is difficult to wear.  It shifts, moves, sometimes comes out and is hot and uncomfortable at times.   She presents today to discuss options for breast reconstruction.  She is due for her annual right breast screening mammogram at the end of the month.  She is interested in learning more about options for symmetry with the left breast.    Past Medical History:   Diagnosis Date    Chemotherapy adverse reaction 06/25/2019    Allergic rhinitis due to allergen     Anemia     Anxiety     Breast cancer ([place])     Burn     Cancer [facility])     Congestive heart failure (HCC)     Headache     History of radiation therapy       Past Surgical History:   Procedure Laterality Date    BREAST BIOPSY Right 11/2018    2 core bx       BREAST RECONSTRUCTION Right 06/25/2019    reduction - Dr. Lowella Dell    PR MASTECTOMY SIMPLE COMPLETE Left 06/25/2019    Had radiation        Current Outpatient Medications:     acetaminophen 500 MG tablet, Take 1-2 tabs by mouth once daily as needed for pain., Disp: , Rfl:     aspirin 81 MG EC tablet, Take 1 tablet (81 mg) by mouth., Disp: , Rfl:     cetirizine 10 MG tablet, Take by mouth., Disp: , Rfl:     cyanocobalamin (Vitamin B-12) 500 MCG tablet, Take 1 tablet (500 mcg) by mouth daily., Disp: , Rfl:     famotidine 20 MG tablet, Take 1 tablet (20 mg) by mouth as needed., Disp: , Rfl:     furosemide 20 MG tablet, Take 1 tablet (20 mg) by mouth daily., Disp: 90 tablet, Rfl: 3    letrozole 2.5 MG tablet, Take 1 tablet (2.5 mg) by mouth daily., Disp: 90 tablet, Rfl: 3    methocarbamol 500 MG tablet, Take 1 tablet (500 mg) by mouth at bedtime as needed for muscle spasms., Disp: 30 tablet,  Rfl: 2    metoprolol succinate ER 25 MG 24 hr tablet, Please take 1/2 tablet(12.5) in the am and 1 tablet ('25mg'$ ) in the pm. Do not chew or crush., Disp: 135 tablet, Rfl: 3    potassium chloride ER 10 MEQ ER tablet, Take 3 tablets (30 mEq) by mouth daily, Disp: 270 tablet, Rfl: 1    valsartan 40 MG tablet, Take 1 tablet (40 mg) by mouth 2 times daily, Disp: 180 tablet, Rfl: 1    venlafaxine ER 75 MG 24 hr capsule, Take 1 capsule (75 mg) by mouth every morning., Disp: 90 capsule, Rfl: 0     Family History       Problem (# of Occurrences) Relation (Name,Age of Onset)    Diabetes (1) Mother    Alcohol/Drug (1) Maternal Grandfather    Hearing Loss (2) Mother, Father    Ovarian Cancer (1) Paternal Aunt (45)    Obesity (2) Mother, Maternal Grandfather    Skin Cancer (2) Father, Maternal Grandfather           Social History     Tobacco Use    Smoking status: Never    Smokeless tobacco: Never   Substance Use Topics    Alcohol use: Not Currently     Comment: occasional wine , 2 glasses a month    Drug use: Not Currently        Physical  Exam:   Blood pressure 105/71, pulse 65, temperature 36.5 C, temperature source Temporal, height '5\' 6"'$  (1.676 m), weight 75.3 kg (166 lb), SpO2 99%.  Gen: NAD, well appearing  Breast:   Right: Well healed wise pattern incision, nipple is well perfused and healthy, pink and sensate, no palpable masses.  Grade 0 ptosis  Left: well healed simple mastectomy incision  Skin:No evidence of erythema or infection  Extremities: able to abduct her arms overhead bilaterally  Abd: soft ntnd, no hernia.  Adequate tissue for unilateral reconstruction to match the right breast.    Assessment and Plan:   Yolanda Bowers is a 51 year old female with triple negative left breast cancer, s/p L simple mastectomy, L SLNB and right breast reduction presenting today for discussion about options for breast reconstruction.    We discussed that at this point, implant based reconstruction in the setting of radiation and simple mastectomy is not an option.  She could undergo autologous reconstruction with DIEP free flap or pedicle LD with an implant.  II would recommend DIEP free flap reconstruction for her given her extensive work in [employer] and need for full back and shoulder strength.      AUTOLOGOUS RECONSTRUCTION  We discussed autologous reconstruction with abdominal based free flap (DIEP vs msTRAM), pedicle latissimus dorsi flaps, medial thigh based free flaps (TUG vs PAP) flaps.  Pedicled latissimus dorsi flap would also require an implant to achieve the volume she is wanted to achieve and given her work in [employer] I would not recommend.  We discussed autologous reconstruction utilizing abdominal skin and fat for free flap breast reconstruction with DIEP vs. msTRAM flap to reconstruct the breast. This would be an approximately 8-10 hour surgery with 2-5 days in the hospital and 6-8 weeks total recovery time. No pushing, pulling, lifting more than 10 lbs with the arms for minimum of 4 weeks.  Typically start upper extremity PT at 4  weeks.  There would be a long transverse abdominal scar, and a scar around the umbilicus. We reviewed the risks of this procedure  including but no limited to, general anesthesia, hematoma, seroma, need for re-operation, abdominal hernia, abdominal wall bulge or weakness, abdominal contour changes, fat necrosis, wound healing complications, sensation changes, DVT, PE, stroke, flap thrombosis, flap partial or complete loss, and other anesthesia risks.       We discussed a pre operative CT abd/pelvis is done to map the DIEP vascular anatomy.    NICM (chemo-induced) with recovery in EF:   We discussed that her cardiomyopathy appears to be stable and recovered based on her symptoms, stable medication and chart review.  However, I would need clearance from her Cardiology team that she safe for the length of anesthesia required for a DIEP flap reconstruction.    She was invited to ask questions which were answered satisfactorily. Patient voiced agreement and understanding with the treatment plan.   She was given patient information about DIEP flap reconstruction.  She will consider her options and let us know.      I spent a total of 30 minutes for the patient's care on the date of the service.         Hanley Seamen. Lowella Dell, MD  Attending Physician, Assistant Professor  Division of [facility] [place] [id number]  [place], [place], [place]    Addendum:  Right breast mammogram 08/17/2022 - [id number]

## 2022-08-30 ENCOUNTER — Encounter (HOSPITAL_BASED_OUTPATIENT_CLINIC_OR_DEPARTMENT_OTHER): Payer: Self-pay | Admitting: Medical Oncology

## 2022-08-30 ENCOUNTER — Ambulatory Visit: Payer: 59 | Attending: Medical Oncology | Admitting: Medical Oncology

## 2022-08-30 ENCOUNTER — Ambulatory Visit (HOSPITAL_BASED_OUTPATIENT_CLINIC_OR_DEPARTMENT_OTHER): Payer: 59

## 2022-08-30 DIAGNOSIS — C50412 Malignant neoplasm of upper-outer quadrant of left female breast: Secondary | ICD-10-CM

## 2022-08-30 DIAGNOSIS — Z170 Estrogen receptor positive status [ER+]: Secondary | ICD-10-CM | POA: Insufficient documentation

## 2022-08-30 LAB — CBC, DIFF
% Basophils: 0 %
% Eosinophils: 2 %
% Immature Granulocytes: 0 %
% Lymphocytes: 24 %
% Monocytes: 7 %
% Neutrophils: 67 %
% Nucleated RBC: 0 %
Absolute Eosinophil Count: 0.1 10*3/uL (ref 0.00–0.50)
Absolute Lymphocyte Count: 1.1 10*3/uL (ref 1.00–4.80)
Basophils: 0.02 10*3/uL (ref 0.00–0.20)
Hematocrit: 36 % (ref 36.0–45.0)
Hemoglobin: 12.4 g/dL (ref 11.5–15.5)
Immature Granulocytes: 0 10*3/uL (ref 0.00–0.05)
MCH: 30.8 pg (ref 27.3–33.6)
MCHC: 34.5 g/dL (ref 32.2–36.5)
MCV: 89 fL (ref 81–98)
Monocytes: 0.3 10*3/uL (ref 0.00–0.80)
Neutrophils: 3.05 10*3/uL (ref 1.80–7.00)
Nucleated RBC: 0 10*3/uL
Platelet Count: 204 10*3/uL (ref 150–400)
RBC: 4.02 10*6/uL (ref 3.80–5.00)
RDW-CV: 12.1 % (ref 11.0–14.5)
WBC: 4.57 10*3/uL (ref 4.3–10.0)

## 2022-08-30 LAB — COMPREHENSIVE METABOLIC PANEL
ALT (GPT): 17 U/L (ref 7–33)
AST (GOT): 20 U/L (ref 9–38)
Albumin: 4.5 g/dL (ref 3.5–5.2)
Alkaline Phosphatase (Total): 50 U/L (ref 34–121)
Anion Gap: 7 (ref 4–12)
Bilirubin (Total): 0.4 mg/dL (ref 0.2–1.3)
Calcium: 9.6 mg/dL (ref 8.9–10.2)
Carbon Dioxide, Total: 32 meq/L (ref 22–32)
Chloride: 102 meq/L (ref 98–108)
Creatinine: 0.9 mg/dL (ref 0.38–1.02)
Glucose: 72 mg/dL (ref 62–125)
Potassium: 3.5 meq/L — ABNORMAL LOW (ref 3.6–5.2)
Protein (Total): 6.7 g/dL (ref 6.0–8.2)
Sodium: 141 meq/L (ref 135–145)
Urea Nitrogen: 18 mg/dL (ref 8–21)
eGFR by CKD-EPI 2021: >60 mL/min/{1.73_m2} (ref >59)

## 2022-08-30 NOTE — Progress Notes (Signed)
[facility]  BREAST ONCOLOGY CLINIC NOTE    IDENTIFICATION  Yolanda Bowers is a 52 year old female with history of cT3N3 left breast invasive ductal carcinoma, ER/PR/HER2+, s/p neoadjuvant chemotherapy with ddAC-THP, left mastectomy and SLN biopsy, and adjuvant radiation therapy. She had a complete pathologic response at the time of surgery. Presents to clinic today for labs and follow up while on letrozole.     ONCOLOGIC HISTORY     Oncology History Overview Note   10/2018: [name]   11/24/2018: Bilateral mammogram revealed a negative right breast. To the left breast UOQ, there was an 35m mass and a 2.4 cm mass laterally, 17 cm from the nipple. Targeted left breast ultrasound revealed at 3 o'clock 8 cm from the nipple, a 21x13x235mmass and an enlarged axillary lymph node measuring 12x11x1575mThere were multiple enlarged lymph nodes  11/27/2018: US-guided biopsy of the breast mass revealed invasive ductal carcinoma, ER/PR+, HER2+, grade 3. There was angiolymphatic invasion seen on biopsy. Biopsy of axillary lymph node positive for carcinoma.   12/08/2018: Bilateral breast MRI revealed right breast at 3 o'clock, 2.7cm from the nipple, a 10 x 6 x8mm23mss and at 12 o'clock, 11cm from the nipple, a spiculated 16 x 4 x 13mm55ms. Ultrasound recommended.  To the left breast, mass consistent with biopsy proved carcinoma measuring 24 x 20 x 24mm.67mre was also diffuse NME involving mid-outer breast extending over 110x45x69mm. 69min the NME, there was a small spiculated mass at 12 o'clock measuring 12mm. M68mple level 1,2 and 3 lymph nodes seen, in addition to at least 3 internal mammary nodes.   12/14/2018: PET CT without evidence of distant metastasis. There are 4mm lung36mdules bilaterally.   12/29/2018: Started on neoadjuvant chemotherapy with ddAC x4 cycles.  03/09/2019: Started on neoadjuvant chemotherapy with Taxol, Herceptin, and  Perjeta.  06/25/2019: Underwent left mastectomy and SLN biopsy with Dr. David ByrImagene Shellergy revealed a single focus of angiolymphatic involvement by carcinoma (< 0.1cm). Negative surgical margins. 0 of 5 lymph nodes positive for carcinoma. ypT0 ypN0.   09/21/2019: Completed XRT.  Started on letrozole 2.'5mg'$  PO daily.     01/29/2020: Given drop in LVEF, last Herceptin/Perjeta infusion was discontinued. Letrozole continued.    Negative genetic panel testing, 11/2018.     Malignant neoplasm of upper-outer quadrant of left breast in female, estrogen receptor positive    11/07/2019 - 01/29/2020 Systemic Therapy    pertuzumab (Perjeta) 840 mg in sodium chloride 0.9 % 250 mL IVPB, Intravenous, 4 of 6 cycles  trastuzumab (Herceptin) 593 mg in sodium chloride 0.9 % 250 mL IVPB, Intravenous, 4 of 6 cycles     11/18/2019 Initial Diagnosis    Malignant neoplasm of upper-outer quadrant of left breast in female, estrogen receptor positive (HCC)     [place]  INTERVAL HISTORY  Yolanda Bowers she is overall quite well. 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  . She wasMarland Kitchenable to do lots of trave l and felt great. Then, she notes fatigue, chest pain, and heart palpitations. She saw her cardiologist and they do have her on a furosemide and K+.  She does endorse coming home to stress, but she feels that she manages this with stretching, yoga, and the calm app.   .Marland Kitchen  She endorses intermittent nausea, mostly after taking medication. She switched her metoprolol to night which helps some. She denies any fever. She denies any persistent bone pain. She uses a gericare supplement that keeps her bowel movements regular. She denies any new chest wall concerns. She endorses hot flashes that are daily and nightly, improved over the past few days.     She is feeling overall much stronger and well, so she is now  gearing up to formally separate and divorce her husband, as this has been an unsatisfying situation for her for many years. He relies on her for support - emotional, physical, she is the provider, and he has more of a child like dynamic. He's guiding now, and will come home, have papers served. She has worked with a Chief Executive Officer, and has a safe plan, as she has had a restraining order on him in the past. He will be deported if he does not follow the procedures outlined, so she is hopeful, and has explained the separation plan to him by phone.      ECOG Status: (0) Fully active, able to carry on all predisease performance without restriction     REVIEW OF SYSTEMS  A complete review of systems was conducted and is negative in detail, except as noted in the interval history.    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 Lowella Dell    PR MASTECTOMY SIMPLE COMPLETE Left 06/25/2019    Had radiation        ALLERGIES  Review of patient's allergies indicates:  Allergies   Allergen Reactions    Oxycodone Skin: Itching    Codeine XF:GHWEXH/BZJIRCVE    Lisinopril Resp: Cough    Walnut Skin: Rash    Paclitaxel Skin: Hives        MEDICATIONS    Current Outpatient Medications:     acetaminophen 500 MG tablet, Take 1-2 tabs by mouth once daily as needed for pain., Disp: , Rfl:     aspirin 81 MG EC tablet, Take 1 tablet (81 mg) by mouth., Disp: , Rfl:      cetirizine 10 MG tablet, Take by mouth., Disp: , Rfl:     cyanocobalamin (Vitamin B-12) 500 MCG tablet, Take 1 tablet (500 mcg) by mouth daily., Disp: , Rfl:     famotidine 20 MG tablet, Take 1 tablet (20 mg) by mouth as needed., Disp: , Rfl:     furosemide 20 MG tablet, Take 1 tablet (20 mg) by mouth daily., Disp: 90 tablet, Rfl: 3    letrozole 2.5 MG tablet, Take 1 tablet (2.5 mg) by mouth daily., Disp: 90 tablet, Rfl: 3    methocarbamol 500 MG tablet, Take 1 tablet (500 mg) by mouth at bedtime as needed for muscle spasms., Disp: 30 tablet, Rfl:  2    metoprolol succinate ER 25 MG 24 hr tablet, Please take 1/2 tablet(12.5) in the am and 1 tablet ('25mg'$ ) in the pm. Do not chew or crush., Disp: 135 tablet, Rfl: 3    potassium chloride ER 10 MEQ ER tablet, Take 3 tablets (30 mEq) by mouth daily, Disp: 270 tablet, Rfl: 1    valsartan 40 MG tablet, Take 1 tablet (40 mg) by mouth 2 times daily, Disp: 180 tablet, Rfl: 1    venlafaxine ER 75 MG 24 hr capsule, Take 1 capsule (75 mg) by mouth every morning., Disp: 90 capsule, Rfl: 0     SOCIAL HISTORY:   Social History     Tobacco Use    Smoking status: Never    Smokeless tobacco: Never   Substance Use Topics    Alcohol use: Not Currently     Comment: occasional wine , 2 glasses a month    Drug use: Not Currently      Two children, one in college     FAMILY HISTORY:    Cancer-related family history includes Ovarian Cancer (age of onset: 1) in her paternal aunt; Skin Cancer in her father and maternal grandfather.         VITAL SIGNS: BP 115/75   Pulse 60   Temp 36.7 C (Oral)   Wt 76 kg (167 lb 8.8 oz)   SpO2 100%   BMI 27.04 kg/m       GENERAL: Well appearing, well developed female in no acute distress.  HEENT: Normocephalic, atraumatic. Sclerae anicteric. Pupils are equal, round.   NECK: Neck is supple without any appreciable lymphadenopathy or thyromegaly.  LUNGS: Respirations regular and unlabored. Clear to auscultation bilaterally. No  wheezing/rales/rhonci  CARDIAC: Regular rate and rhythm. No murmurs, gallops, or rubs.   BREASTS: Deferred  ABDOMEN: Soft, non-tender, non-distended. Normoactive bowel sounds. No hepatosplenomegaly or masses appreciated.  EXTREMITIES: No edema, clubbing, or cyanosis.  SKIN: No visible rash, bruising, or petechiae.  MSK: Without muscle or joint deformities. No spinal or hip tenderness to palpation.   NEUROLOGICAL: Alert and oriented. Mental status appropriate.   NUTRITIONAL STATUS: Adequate.      Office Visit on 04/15/2022   Component Date Value    Color, URN 04/15/2022 Yellow     Clarity, URN 04/15/2022 Clear     Specific Gravity, URN 04/15/2022 1.004 (L)     pH, URN 04/15/2022 6.0     Protein ([place]),* 04/15/2022 Negative     Glucose Qual, URN 04/15/2022 Negative     Ketones, URN 04/15/2022 Negative     Bilirubin (Qual), URN 04/15/2022 Negative     Occult Blood, URN 04/15/2022 1+ (A)     Nitrite, URN 04/15/2022 Negative     Leukocyte Esterase, URN 04/15/2022 Positive (A)     Urobilinogen, URN 04/15/2022 0.1-1.9     Comments for Macroscopic* 04/15/2022 None     WBC, URN 04/15/2022 6-10(1+) (A)     RBC, URN 04/15/2022 0-2(NEG)     Bacteria, URN 04/15/2022 Not Seen     Epith Cells_Squamous, URN 04/15/2022 0-5(NEG)     Epith Cells_Renal/Trans,* 04/15/2022 <3(NEG)     Comments For Microscopic* 04/15/2022 None     Mucus, URN 04/15/2022 Present (A)     Lab Test Requested 04/15/2022 culture and sensitivity     Specimen Type/Description 04/15/2022 Urine     Sample To Use 04/15/2022 urine 8/24     Test Request Status 04/15/2022 Order Processed     Special Requests  04/15/2022 Sensitivity     Culture 04/15/2022  (A)                     Value:>100,000 Col/mL  Beta hemolytic Streptococcus, Group B     Lab on 04/15/2022   Component Date Value    WBC 04/15/2022 3.45 (L)     RBC 04/15/2022 4.19     Hemoglobin 04/15/2022 12.5     Hematocrit 04/15/2022 37     MCV 04/15/2022 89     MCH 04/15/2022 29.8     MCHC 04/15/2022 33.4      Platelet Count 04/15/2022 190     RDW-CV 04/15/2022 12.5     % Neutrophils 04/15/2022 64     % Lymphocytes 04/15/2022 24     % Monocytes 04/15/2022 8     % Eosinophils 04/15/2022 3     % Basophils 04/15/2022 1     % Immature Granulocytes 04/15/2022 0     Neutrophils 04/15/2022 2.25     Absolute Lymphocyte Count 04/15/2022 0.82 (L)     Monocytes 04/15/2022 0.27     Absolute Eosinophil Count 04/15/2022 0.09     Basophils 04/15/2022 0.02     Immature Granulocytes 04/15/2022 0.00     Nucleated RBC 04/15/2022 0.00     % Nucleated RBC 04/15/2022 0     Sodium 04/15/2022 138     Potassium 04/15/2022 4.0     Chloride 04/15/2022 103     Carbon Dioxide, Total 04/15/2022 28     Anion Gap 04/15/2022 7     Glucose 04/15/2022 82     Urea Nitrogen 04/15/2022 15     Creatinine 04/15/2022 0.77     Protein (Total) 04/15/2022 6.8     Albumin 04/15/2022 4.4     Bilirubin (Total) 04/15/2022 0.5     Calcium 04/15/2022 9.7     AST (GOT) 04/15/2022 27     Alkaline Phosphatase (To* 04/15/2022 40     ALT (GPT) 04/15/2022 19     eGFR by CKD-EPI 2021 04/15/2022 >60        Results for orders placed or performed in visit on 08/30/22   CBC with Differential   Result Value Ref Range    WBC 4.57 4.3 - 10.0 10*3/uL    RBC 4.02 3.80 - 5.00 10*6/uL    Hemoglobin 12.4 11.5 - 15.5 g/dL    Hematocrit 36 36.0 - 45.0 %    MCV 89 81 - 98 fL    MCH 30.8 27.3 - 33.6 pg    MCHC 34.5 32.2 - 36.5 g/dL    Platelet Count 204 150 - 400 10*3/uL    RDW-CV 12.1 11.0 - 14.5 %    % Neutrophils 67 %    % Lymphocytes 24 %    % Monocytes 7 %    % Eosinophils 2 %    % Basophils 0 %    % Immature Granulocytes 0 %    Neutrophils 3.05 1.80 - 7.00 10*3/uL    Absolute Lymphocyte Count 1.10 1.00 - 4.80 10*3/uL    Monocytes 0.30 0.00 - 0.80 10*3/uL    Absolute Eosinophil Count 0.10 0.00 - 0.50 10*3/uL    Basophils 0.02 0.00 - 0.20 10*3/uL    Immature Granulocytes 0.00 0.00 - 0.05 10*3/uL    Nucleated RBC 0.00 0.00 10*3/uL    % Nucleated RBC 0 %   Comprehensive Metabolic Panel    Result Value Ref Range    Sodium 141 135 -  145 meq/L    Potassium 3.5 (L) 3.6 - 5.2 meq/L    Chloride 102 98 - 108 meq/L    Carbon Dioxide, Total 32 22 - 32 meq/L    Anion Gap 7 4 - 12    Glucose 72 62 - 125 mg/dL    Urea Nitrogen 18 8 - 21 mg/dL    Creatinine 0.90 0.38 - 1.02 mg/dL    Protein (Total) 6.7 6.0 - 8.2 g/dL    Albumin 4.5 3.5 - 5.2 g/dL    Bilirubin (Total) 0.4 0.2 - 1.3 mg/dL    Calcium 9.6 8.9 - 10.2 mg/dL    AST (GOT) 20 9 - 38 U/L    Alkaline Phosphatase (Total) 50 34 - 121 U/L    ALT (GPT) 17 7 - 33 U/L    eGFR by CKD-EPI 2021 >60 >59 mL/min/1.73_m2         ASSESSMENT/PLAN  Yolanda Bowers is a 51 year old female with history of cT3N3 left breast invasive ductal carcinoma, ER/PR/HER2+, s/p neoadjuvant chemotherapy with ddAC-THP, left mastectomy and SLN biopsy, and adjuvant radiation therapy. She had a complete pathologic response at the time of surgery. Presents to clinic today for labs and follow up while on adjuvant letrozole.    1. Malignant neoplasm of upper-outer quadrant of left breast in female, estrogen receptor positive ([place])  Yolanda Bowers has been doing well but notes 3 weeks of feeling unwell. Cardiologist working up heart palpitations and chest pain with Holter monitor, just removed. Yolanda Bowers is tolerating letrozole well overall without significant side effects. Continue for goal of 5 years.  She gets surveillance imaging at [place]:    Yolanda Bowers notes worsening anxiety. She is working with psych and encouraged her to reach out if she has any concerns or would like a SW referral here.   -Patient working with counselor at [employer], continued to encourage reaching out if things worsen.   We discussed that she needs to keep herself safe, and that she will need to identify DPOA etc, given the plan for divorce, and that she would want this to be someone other than her husband.    Survivorship:  - Counseled on regular exercise and dietary measures to reduce risk of recurrence.    No orders  of the defined types were placed in this encounter.     DISPOSITION  Patient to return to clinic in 4-6 months for follow up and surveillance, or sooner if needed. Patient encouraged to contact clinic in the interim with any questions or concerns.

## 2022-08-30 NOTE — Addendum Note (Signed)
Addended by: Kittie Plater on: 08/30/2022 09:27 PM     Modules accepted: Orders

## 2022-09-07 ENCOUNTER — Ambulatory Visit (HOSPITAL_BASED_OUTPATIENT_CLINIC_OR_DEPARTMENT_OTHER): Payer: 59 | Admitting: Cardiovascular Disease

## 2022-09-07 ENCOUNTER — Encounter (HOSPITAL_BASED_OUTPATIENT_CLINIC_OR_DEPARTMENT_OTHER): Payer: Self-pay | Admitting: Cardiovascular Disease

## 2022-09-07 ENCOUNTER — Ambulatory Visit
Admission: RE | Admit: 2022-09-07 | Discharge: 2022-09-07 | Disposition: A | Discharge status: Home / Self Care | Payer: 59 | Attending: Adult Congenital Heart Disease | Admitting: Adult Congenital Heart Disease

## 2022-09-07 VITALS — BP 118/76 | HR 68 | Temp 97.7°F | Ht 66.0 in | Wt 165.0 lb

## 2022-09-07 DIAGNOSIS — Z170 Estrogen receptor positive status [ER+]: Secondary | ICD-10-CM

## 2022-09-07 DIAGNOSIS — C50412 Malignant neoplasm of upper-outer quadrant of left female breast: Secondary | ICD-10-CM

## 2022-09-07 DIAGNOSIS — I493 Ventricular premature depolarization: Secondary | ICD-10-CM | POA: Insufficient documentation

## 2022-09-07 DIAGNOSIS — I427 Cardiomyopathy due to drug and external agent: Secondary | ICD-10-CM

## 2022-09-07 DIAGNOSIS — T451X5A Adverse effect of antineoplastic and immunosuppressive drugs, initial encounter: Secondary | ICD-10-CM

## 2022-09-07 DIAGNOSIS — F419 Anxiety disorder, unspecified: Secondary | ICD-10-CM

## 2022-09-07 DIAGNOSIS — E876 Hypokalemia: Secondary | ICD-10-CM | POA: Insufficient documentation

## 2022-09-07 DIAGNOSIS — I429 Cardiomyopathy, unspecified: Secondary | ICD-10-CM

## 2022-09-07 LAB — TRANSTHORACIC ECHO (TTE) COMPLETE
AoV max: 110.5 cm/s
Ascending aorta: 3.1 cm
E/E' ratio: 6.3
EF: 58.9 %
IVSd: 0.75 cm
LV Systolic Volume (BP): 32.8 ml
LVIDd: 4.9 cm
LVIDs: 3.6 cm
LVPWd: 0.78 cm
RV Free wall pk S': 24.3 mmHg
RVDd: 3.8 cm
TR Peak Vel: 219.6 cm/s

## 2022-09-07 LAB — COMPREHENSIVE METABOLIC PANEL
ALT (GPT): 19 U/L (ref 7–33)
AST (GOT): 22 U/L (ref 9–38)
Albumin: 4.3 g/dL (ref 3.5–5.2)
Alkaline Phosphatase (Total): 34 U/L (ref 34–121)
Anion Gap: 8 (ref 4–12)
Bilirubin (Total): 0.5 mg/dL (ref 0.2–1.3)
Calcium: 9.6 mg/dL (ref 8.9–10.2)
Carbon Dioxide, Total: 29 meq/L (ref 22–32)
Chloride: 105 meq/L (ref 98–108)
Creatinine: 0.79 mg/dL (ref 0.38–1.02)
Glucose: 85 mg/dL (ref 62–125)
Potassium: 4.2 meq/L (ref 3.6–5.2)
Protein (Total): 6.7 g/dL (ref 6.0–8.2)
Sodium: 142 meq/L (ref 135–145)
Urea Nitrogen: 20 mg/dL (ref 8–21)
eGFR by CKD-EPI 2021: >60 mL/min/{1.73_m2} (ref >59)

## 2022-09-07 LAB — CBC, DIFF
% Basophils: 0 %
% Eosinophils: 1 %
% Immature Granulocytes: 0 %
% Lymphocytes: 18 %
% Monocytes: 6 %
% Neutrophils: 75 %
% Nucleated RBC: 0 %
Absolute Eosinophil Count: 0.07 10*3/uL (ref 0.00–0.50)
Absolute Lymphocyte Count: 1 10*3/uL (ref 1.00–4.80)
Basophils: 0.02 10*3/uL (ref 0.00–0.20)
Hematocrit: 36 % (ref 36.0–45.0)
Hemoglobin: 12 g/dL (ref 11.5–15.5)
Immature Granulocytes: 0.01 10*3/uL (ref 0.00–0.05)
MCH: 30 pg (ref 27.3–33.6)
MCHC: 33.2 g/dL (ref 32.2–36.5)
MCV: 90 fL (ref 81–98)
Monocytes: 0.35 10*3/uL (ref 0.00–0.80)
Neutrophils: 4.07 10*3/uL (ref 1.80–7.00)
Nucleated RBC: 0 10*3/uL
Platelet Count: 207 10*3/uL (ref 150–400)
RBC: 4 10*6/uL (ref 3.80–5.00)
RDW-CV: 12.2 % (ref 11.0–14.5)
WBC: 5.52 10*3/uL (ref 4.3–10.0)

## 2022-09-07 LAB — T4, FREE: Thyroxine (Free): 0.9 ng/dL (ref 0.6–1.2)

## 2022-09-07 LAB — LAB ADD ON ORDER

## 2022-09-07 LAB — B_TYPE NATRIURETIC PEPTIDE: B_Type Natriuretic Peptide: 24 pg/mL (ref <101)

## 2022-09-07 MED ORDER — SPIRONOLACTONE 25 MG OR TABS
12.5000 mg | ORAL_TABLET | Freq: Every day | ORAL | 3 refills | Status: DC
Start: 2022-09-07 — End: 2022-12-13

## 2022-09-07 NOTE — Patient Instructions (Addendum)
Your echocardiogram shows stability    Start spironolactone 1/2 tab daily to maintain potassium     Follow-up labs in 4 weeks    Follow-up in 6 months    If you have any questions, please call my RN, Ivar Drape at [phone].  You can also send [place] a eCare message for non-urgent matters  Please allow at least 48 hours for a response.   If you don't hear back, please call.

## 2022-09-07 NOTE — Progress Notes (Signed)
CARDIOLOGY CLINIC NOTE    Name:  Yolanda Bowers  Date of Birth: 1971-02-14  Medical Record Number: [id number]  Primary Care Physician:  Leander Rams, MD  Referring Provider:  Teena Dunk, *  Date of Service: 09/07/2022    ID  Yolanda Bowers is a 52 year old female with history of high-grade infiltrating ductal carcinoma of the left breast (ER+/PR+, FISH +) with angiolymphatic invasion, history of neoadjuvant chemotherapy with ddAC-THP, left mastectomy with lymph node biopsy, received 4 or 6 cycles of herceptin and Perjeta due to drop in EF, on letrozole, history of PVC ablation on 03/21/2020, here for follow-up.    Chief Complaint/Reason for Visit  Follow-Up     Last seen on 04/08/2022 for worsening palpitations and fluid retention symptoms.  7 day holter monitor ordered as well as lower extremity venous duplex to exclude DVT due to recent travel at that time.  Holter monitor showed rare PACs/PVCs/ectopic atrial rhythm, and 11 episodes of short episodes of atrial tachycardia.  Venous duplex was negative for DVT.    Stable and remains in remission from cancer standpoint    Anxiety:  worse.  Seeing a Social worker.  Getting a divorce.      Saw a plastic surgeon regarding breast reconstruction.  Implants are not an option due to radiation history.  Her best option is a abdominal based free flap reconstruction.  Patient is leaning towards not pursuing reconstruction surgery.    Since starting lasix, urinating a lot.   She had an episode of UTI in 03/2022.  Treated with antibiotics.  No recurrence.  Appetite remains intact.      She works full time, challenging days.  Pacing herself at work.    Denies syncope/presyncopal symptoms.     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      Anxiety [F41.9] 10/28/2020    PVC's (premature ventricular contractions)  [I49.3] 02/18/2020     Ablation on 03/21/2020:  Successful ablation for PVCs from the interleaflet trigone in the LCC/RCC commisure below the aortic valve      Chemotherapy induced cardiomyopathy  [I42.7, T45.1X5A] 01/19/2020     Echo: 03/28/2019:  left ventricular end-diastolic dimension of 3.7TK, EF of 60%, strain -20%, trabeculated LV, normal valves, mild LAE, normal diastology    echocardiogram from 08/14/2019:  left ventricular end-diastolic dimension of 2.4OX, EF of 59%, GLS -20%, no change from 03/28/2019    Echocardiogram from 01/02/2020:   left ventricular end-diastolic dimension of 7.3ZH, EF of 51%, GLS -13%, normal RV function, normal valves, decreased function compared to 08/14/2019    Coronary angiogram from 03/20/2020:  normal    cMRI from 03/18/2020:  1. The left ventricle (LV) is mildly dilated (LVEDVi= 94 ml/m2) with low normal systolic function (LVEF= 29%). No regional wall motion abnormalities. There is no LV hypertrophy (LVmassI= 45 gm/m2).     2. The right ventricle (RV) is normal in size (RVEDVi= 83 ml/m2) with normal systolic function (RVEF= 92%).      3. On delayed enhancement imaging there is intramyocardial late gadolinium enhancement at the inferior RV attachment site.     4. On limited T2-weighted imaging there is no evidence of myocardial edema.     5. On T1-mapping, the native T1 value is  1024m (normal).     6. No  significant valvular abnormalities.     7. Left atrial area measures 25cm2 and LAVI= 69m/m2 (dilated) via biplanar method; Right atrial area measures 14cm2 via 4-chamber monoplanar method.              History of left mastectomy [Z90.12] 08/20/2022    Encounter to discuss breast reconstruction [Z71.89] 08/20/2022    History of radiation therapy [Z92.3] 08/20/2022    Malignant neoplasm of upper-outer quadrant of left breast in female, estrogen receptor positive  [C50.412, Z17.0] 11/18/2019    CIN I (cervical intraepithelial neoplasia I) [N87.0] 06/01/2018     Review of patient's  allergies indicates:  Allergies   Allergen Reactions    Oxycodone Skin: Itching    Codeine GZT:IWPYKD/XIPJASNK   Lisinopril Resp: Cough    Walnut Skin: Rash    Paclitaxel Skin: Hives     Current Outpatient Medications   Medication Sig Dispense Refill    acetaminophen 500 MG tablet Take 1-2 tabs by mouth once daily as needed for pain.      aspirin 81 MG EC tablet Take 1 tablet (81 mg) by mouth.      cetirizine 10 MG tablet Take by mouth.      cyanocobalamin (Vitamin B-12) 500 MCG tablet Take 1 tablet (500 mcg) by mouth daily.      famotidine 20 MG tablet Take 1 tablet (20 mg) by mouth as needed.      furosemide 20 MG tablet Take 1 tablet (20 mg) by mouth daily. 90 tablet 3    letrozole 2.5 MG tablet Take 1 tablet (2.5 mg) by mouth daily. 90 tablet 3    methocarbamol 500 MG tablet Take 1 tablet (500 mg) by mouth at bedtime as needed for muscle spasms. 30 tablet 2    metoprolol succinate ER 25 MG 24 hr tablet Please take 1/2 tablet(12.5) in the am and 1 tablet ('25mg'$ ) in the pm. Do not chew or crush. 135 tablet 3    potassium chloride ER 10 MEQ ER tablet Take 3 tablets (30 mEq) by mouth daily 270 tablet 1    spironolactone 25 MG tablet Take 0.5 tablets (12.5 mg) by mouth daily. 45 tablet 3    valsartan 40 MG tablet Take 1 tablet (40 mg) by mouth 2 times daily 180 tablet 1    venlafaxine ER 75 MG 24 hr capsule Take 1 capsule (75 mg) by mouth every morning. 90 capsule 0     No current facility-administered medications for this visit.       Review of Systems:  A complete review of systems was otherwise negative except as noted above.   Reviewed initial clinical health assessment    FAMILY HISTORY:  Premature coronary artery disease:  Very distant  diabetes : mother  Hypertension: mother  Stroke: grandmother (paternal)  Cancer:  Skin cancer (father), grandfather (bone cancer)    SOCIAL HISTORY:  AMetallurgist non-smoker, no alcohol, no recreational drug use      Physical Exam:  Wt Readings from Last 3 Encounters:   09/07/22  74.8 kg (165 lb)   08/30/22 76 kg (167 lb 8.8 oz)   08/17/22 72.6 kg (160 lb)     BP 118/76   Pulse 68   Temp 36.5 C (Temporal)   Ht '5\' 6"'$  (1.676 m)   Wt 74.8 kg (165 lb)   SpO2 99%   BMI 26.63 kg/m     General: well developed female  in no acute distress  Eyes:  Anicteric sclera, PERRLA  Neck: No masses, normal carotid pulses without audible bruit, thyroid gland is normal in size  Chest:  Lungs are clear to auscultation without wheezes, rales, or rhonchi  Cardiovascular: regular rate and rhythm, normal S1 and S2, no S3 or S4, 1/6 holosystolic murmur, PMI felt midclavicular, JVP of 5-6 cm  Abdomen: soft non-tender, non-distended with normal bowel sounds, liver span felt at the costal margin, no splenomegaly  Extremities: warm to touch bilaterally with +2 pulses in the femoral, popliteal, tibial, dorsalis pedis bilaterally , no edema bilaterally  Musculoskeletal:  Back is symmetric, no curvature, no tenderness.  Skin:  Normal skin color, texture.  No rashes or lesions  Neurologic:  Gait is normal, sensation grossly normal  Psychiatric:  Appropriate and responds to questions    Laboratory Results:   Results for orders placed or performed in visit on 09/07/22   B-Type Natriuretic Peptide   Result Value Ref Range    B_Type Natriuretic Peptide 24 <101 pg/mL   CBC with Diff   Result Value Ref Range    WBC 5.52 4.3 - 10.0 10*3/uL    RBC 4.00 3.80 - 5.00 10*6/uL    Hemoglobin 12.0 11.5 - 15.5 g/dL    Hematocrit 36 36.0 - 45.0 %    MCV 90 81 - 98 fL    MCH 30.0 27.3 - 33.6 pg    MCHC 33.2 32.2 - 36.5 g/dL    Platelet Count 207 150 - 400 10*3/uL    RDW-CV 12.2 11.0 - 14.5 %    % Neutrophils 75 %    % Lymphocytes 18 %    % Monocytes 6 %    % Eosinophils 1 %    % Basophils 0 %    % Immature Granulocytes 0 %    Neutrophils 4.07 1.80 - 7.00 10*3/uL    Absolute Lymphocyte Count 1.00 1.00 - 4.80 10*3/uL    Monocytes 0.35 0.00 - 0.80 10*3/uL    Absolute Eosinophil Count 0.07 0.00 - 0.50 10*3/uL    Basophils 0.02 0.00 - 0.20  10*3/uL    Immature Granulocytes 0.01 0.00 - 0.05 10*3/uL    Nucleated RBC 0.00 0.00 10*3/uL    % Nucleated RBC 0 %   Comprehensive Metabolic Panel   Result Value Ref Range    Sodium 142 135 - 145 meq/L    Potassium 4.2 3.6 - 5.2 meq/L    Chloride 105 98 - 108 meq/L    Carbon Dioxide, Total 29 22 - 32 meq/L    Anion Gap 8 4 - 12    Glucose 85 62 - 125 mg/dL    Urea Nitrogen 20 8 - 21 mg/dL    Creatinine 0.79 0.38 - 1.02 mg/dL    Protein (Total) 6.7 6.0 - 8.2 g/dL    Albumin 4.3 3.5 - 5.2 g/dL    Bilirubin (Total) 0.5 0.2 - 1.3 mg/dL    Calcium 9.6 8.9 - 10.2 mg/dL    AST (GOT) 22 9 - 38 U/L    Alkaline Phosphatase (Total) 34 34 - 121 U/L    ALT (GPT) 19 7 - 33 U/L    eGFR by CKD-EPI 2021 >60 >59 mL/min/1.73_m2   Lab Add On Order   Result Value Ref Range    Lab Test Requested TSH, free T4     Specimen Type/Description Blood     Sample To Use Most recent     Test Request Status Order Processed    T4 (Thyroxine), Free   Result Value  Ref Range    Thyroxine (Free) 0.9 0.6 - 1.2 ng/dL     Results for orders placed or performed in visit on 02/11/22   Lipid Panel   Result Value Ref Range    Total Cholesterol 201 (H) <200 mg/dL    Triglyceride 161 (H) <150 mg/dL    HDL Cholesterol 85 >39 mg/dL    Non-HDL Cholesterol 116 0 - 159 mg/dL    LDL Cholesterol, NIH Equation 89 <130 mg/dL    Cholesterol/HDL Ratio 2.4     Lipid Panel, Additional Info. (NOTE)          Imaging:    EKG:01/11/2020:  Normal sinus rhythm, heart rate of 74 bpm, normal PR interval, premature ventricular contractions, LAE     cardiopulmonary exercise test on 05/30/2020:  Max VO2 15 ml/kg/min (66%) , RER 1.21, normal blood pressure response to exercise, normal heart rate response, O2 pulse of 82%, 30% MVV     Echocardiogram   07/29/2020:  Normal LV size and functoin EF of 61% with GLS of -19%, normal RV size and function, minimal MR..  EF has improved from 12/2019.    08/21/21:  left ventricular end-diastolic dimension 5cm EF of 60%, GLS -15-19%%, normal RV size  and function, atrial septal aneurysm is present    09/07/2022:  preliminary: normal biventricular function, normal PAPs, atrial septal aneurysm present    Holter monitor   07/29/2020:  Predominant rhythm: NSR   Atrial Tachycardia (AT) 13 episodes, Longest 5 beats @ Avg  83 bpm up to 103 bpm, Fastest 4 beats @ Avg 143 bpm up to  177 bpm   Accelerated Idioventricular Rhythm (AIVR)   PAC 0.03 %   PVC 0.11 %     06/25/2021:  Predominant rhythm: NSR   Atrial Tachycardia (AT) 9 episodes, Longest 6 beats @ Avg  116 bpm up to 137 bpm, Fastest 4 beats @ Avg 157 bpm up to  209 bpm   Ectopic Atrial Rhythm (EAR)   PAC 0.03 %   PVC 0.04 %    04/08/2022:  Predominant rhythm: NSR  - Atrial Tachycardia (AT) 11 episodes, Longest 20 beats @ Avg 151 bpm up to 174 bpm, Fastest 5 beats @ Avg 177 bpm up to 201  bpm  - Ectopic Atrial Rhythm (EAR)  - PAC <0.1 %  - PVC 0.2 %    Patient activation for SR, EAR, PACs, and PVCs.    GDMT:  ACEI/ARB/ARNI: valsartan '40mg'$  BID  Beta-blockers: toprol XL 12.'5mg'$ /'25mg'$  daily  MRAs:  none--> spironolactone 12.'5mg'$  dialy  SGLT2 inhibitor: none  Diuretics: furosemide '20mg'$  daily  Statin: none  Digoxin: none    Anticoagulation:  Aspirin '81mg'$  daily    Assessment:   52 year old female with history of low normal cardiomyopathy following chemotherapy for high-grade infiltrating ductal carcinoma of the left breast (ER+/PR+, FISH +) with angiolymphatic invasion, history of neoadjuvant chemotherapy with ddAC-THP, left mastectomy with lymph node biopsy, received Herceptin and Perjeta (completed 4/6 cycles due to decrement in EF), s/p PVC ablation without LGE on cMRI with improvement in functional capacity and EF.      She has ongoing palpitations secondary to rare PACs, and PVCs and short bursts of atrial tachycardia without significance.  No episodes of VT on recent holter.      Anxiety remains an issue likely contributing to transient worsening episodes of palpitations.  Encouraged to continue stress  modification.    Plan:  Start aldactone 12.'5mg'$  daily  Decrease potassium to 15mq  daily   Repeat metabolic panel in 4 weeks to adjust MRA and potassium dosing  Continue GDMT as noted above  Stress modification  Continue aspirin '81mg'$  daily for atrial septal aneurysm.  Monitor for TIA symptoms.      Follow-up:  Follow-up in 6 months.   Patient will call sooner if symptoms change.    Juanetta Beets, MD  Division of Cardiology  Advanced Heart Failure/Cardiac Transplantation  09/07/2022

## 2022-09-12 ENCOUNTER — Other Ambulatory Visit (HOSPITAL_BASED_OUTPATIENT_CLINIC_OR_DEPARTMENT_OTHER): Payer: Self-pay | Admitting: Cardiovascular Disease

## 2022-09-12 DIAGNOSIS — I429 Cardiomyopathy, unspecified: Secondary | ICD-10-CM

## 2022-09-14 MED ORDER — VALSARTAN 40 MG OR TABS
ORAL_TABLET | ORAL | 1 refills | Status: DC
Start: 2022-09-14 — End: 2023-03-09

## 2022-09-14 NOTE — Telephone Encounter (Signed)
Mychart message sent to patient.

## 2022-09-17 NOTE — Telephone Encounter (Signed)
Per separate TE:        Mychart msg to Pt

## 2022-10-08 MED ORDER — POTASSIUM CHLORIDE ER 20 MEQ OR TBCR
20.0000 meq | EXTENDED_RELEASE_TABLET | Freq: Every day | ORAL | 3 refills | Status: DC
Start: 2022-10-08 — End: 2022-12-13

## 2022-10-08 NOTE — Addendum Note (Signed)
Addended by: Annabell Sabal on: 10/08/2022 10:23 AM     Modules accepted: Orders

## 2022-10-08 NOTE — Telephone Encounter (Signed)
Per Dr [name] continue with spironolactone and increase potassium to 10mq daily. Mychart message sent to patient. Rx pended to Dr MAlfredo Martinez

## 2022-10-18 ENCOUNTER — Other Ambulatory Visit (HOSPITAL_BASED_OUTPATIENT_CLINIC_OR_DEPARTMENT_OTHER): Payer: Self-pay | Admitting: Physician Assistant

## 2022-10-18 DIAGNOSIS — Z170 Estrogen receptor positive status [ER+]: Secondary | ICD-10-CM

## 2022-10-18 MED ORDER — LETROZOLE 2.5 MG OR TABS
2.5000 mg | ORAL_TABLET | Freq: Every day | ORAL | 3 refills | Status: DC
Start: 2022-10-18 — End: 2023-10-05

## 2022-10-18 NOTE — Telephone Encounter (Signed)
Hello,    Please review and refill if appropriate.    Date of last refill: 10/29/21, 90 tabs 3 refills  Date of last office visit: 1/8  Date of next office visit: none    Thanks!

## 2022-11-04 NOTE — Telephone Encounter (Signed)
Dr Konrad Dolores wants patient to increase spironolactone to 25mg  daily to further increase potassium closer to 4.0.

## 2022-11-23 NOTE — Telephone Encounter (Signed)
Called patient to check in on mychart message LVM. Patient to return call. Will review with Dr Konrad Dolores.

## 2022-11-24 ENCOUNTER — Encounter (HOSPITAL_BASED_OUTPATIENT_CLINIC_OR_DEPARTMENT_OTHER): Payer: Self-pay | Admitting: Physician Assistant

## 2022-11-24 MED ORDER — SPIRONOLACTONE 25 MG OR TABS
25.0000 mg | ORAL_TABLET | Freq: Every day | ORAL | 3 refills | Status: DC
Start: 2022-11-24 — End: 2022-12-13

## 2022-11-24 NOTE — Addendum Note (Signed)
Addended by: Chevis Pretty on: 11/24/2022 12:44 PM     Modules accepted: Orders

## 2022-11-24 NOTE — Telephone Encounter (Signed)
Patient called with complaints of weight up 3lbs and feeling bloated around abdomen and arm pit.  Patient is almost out of spirinolactone.  Pended increased dose of spirolactone 25 mg daily for Dr. Konrad Dolores to review and sign .

## 2022-11-26 NOTE — Telephone Encounter (Addendum)
Spoke with Dr Konrad Dolores and she would like to know if the weight gain is sudden. If it has come on suddenly she would like patient to increase furosemide from  daily to  daily and potassium from daily to daily until extra weight has come off. Called patient to check in and LVM. Patient to call back to confirm if weight gain has been sudden or send mychart message with weights. If it is not improving she would like patient to be seen in clinic or with APP.

## 2022-12-09 NOTE — Telephone Encounter (Signed)
Received call from patient regarding dizziness. Patient has felt dizzy since starting spironolactone in January. However the past week or two has felt extremely dizzy and has fallen twice without warning. Patient denies any loss of consciousness. Patient felt very dizzy today at work and went home as she did not want to fall at work. Patient's BP was 99/58. Patient sat down and drank water and ate a snack and BP increased to 102/59 with no change in dizziness. Reviewed medications and patient has not been taking lasix. She thought the spironolactone had replaced lasix. Patients weight yesterday was 166 and she feels like she has been gaining weight unsure if fluid weight. About 2 weeks ago was 163. Patient does feel like abdomen is swollen and has not had much of an appetite and filling up quickly when she does eat. She also states she does have some edema in R ankle. Will discuss with Dr Konrad Dolores next steps.

## 2022-12-13 NOTE — Telephone Encounter (Addendum)
Dr Konrad Dolores would like patient to stop spironolactone and resume lasix  daily and increase potassium to daily. She would also like to see patient on either 5/2 or 5/3 at 4pm. Message sent to [facility] if available. Dr Konrad Dolores would also like patient to states the dates she is wanting for [facility] paperwork. Called patient to update and check in LVM. Patient to call back or send mychart message.

## 2022-12-13 NOTE — Addendum Note (Signed)
Addended by: Angelena Form on: 12/13/2022 02:39 PM     Modules accepted: Orders

## 2022-12-14 MED ORDER — POTASSIUM CHLORIDE ER 20 MEQ OR TBCR
40.0000 meq | EXTENDED_RELEASE_TABLET | Freq: Every day | ORAL | 3 refills | Status: DC
Start: 2022-12-14 — End: 2023-02-01

## 2022-12-17 NOTE — Telephone Encounter (Signed)
Reviewed dates for [employer] paperwork with Dr Konrad Dolores. Patient is requesting 3/15-6/20. Dr Konrad Dolores is wanting to know if patient stopped working on 3/15. Called patient and LVM. Patient to call back or send mychart message.

## 2022-12-24 ENCOUNTER — Ambulatory Visit (HOSPITAL_COMMUNITY): Payer: Self-pay

## 2022-12-24 ENCOUNTER — Ambulatory Visit (HOSPITAL_BASED_OUTPATIENT_CLINIC_OR_DEPARTMENT_OTHER): Payer: 59 | Admitting: Cardiovascular Disease

## 2022-12-24 ENCOUNTER — Ambulatory Visit
Admission: RE | Admit: 2022-12-24 | Discharge: 2022-12-24 | Disposition: A | Discharge status: Home / Self Care | Payer: 59 | Attending: Cardiovascular Disease | Admitting: Cardiovascular Disease

## 2022-12-24 VITALS — BP 105/69 | HR 72 | Temp 97.3°F | Ht 66.0 in | Wt 170.0 lb

## 2022-12-24 DIAGNOSIS — T451X5A Adverse effect of antineoplastic and immunosuppressive drugs, initial encounter: Secondary | ICD-10-CM

## 2022-12-24 DIAGNOSIS — I427 Cardiomyopathy due to drug and external agent: Secondary | ICD-10-CM | POA: Insufficient documentation

## 2022-12-24 DIAGNOSIS — I493 Ventricular premature depolarization: Secondary | ICD-10-CM | POA: Insufficient documentation

## 2022-12-24 DIAGNOSIS — F419 Anxiety disorder, unspecified: Secondary | ICD-10-CM

## 2022-12-24 LAB — CBC, DIFF
% Basophils: 1 %
% Eosinophils: 2 %
% Immature Granulocytes: 0 %
% Lymphocytes: 27 %
% Monocytes: 7 %
% Neutrophils: 63 %
% Nucleated RBC: 0 %
Absolute Eosinophil Count: 0.1 10*3/uL (ref 0.00–0.50)
Absolute Lymphocyte Count: 1.12 10*3/uL (ref 1.00–4.80)
Basophils: 0.02 10*3/uL (ref 0.00–0.20)
Hematocrit: 36 % (ref 36.0–45.0)
Hemoglobin: 11.7 g/dL (ref 11.5–15.5)
Immature Granulocytes: 0 10*3/uL (ref 0.00–0.05)
MCH: 29.1 pg (ref 27.3–33.6)
MCHC: 32.1 g/dL — ABNORMAL LOW (ref 32.2–36.5)
MCV: 91 fL (ref 81–98)
Monocytes: 0.29 10*3/uL (ref 0.00–0.80)
Neutrophils: 2.66 10*3/uL (ref 1.80–7.00)
Nucleated RBC: 0 10*3/uL
Platelet Count: 198 10*3/uL (ref 150–400)
RBC: 4.02 10*6/uL (ref 3.80–5.00)
RDW-CV: 12.2 % (ref 11.0–14.5)
WBC: 4.19 10*3/uL — ABNORMAL LOW (ref 4.3–10.0)

## 2022-12-24 LAB — COMPREHENSIVE METABOLIC PANEL
ALT (GPT): 13 U/L (ref 7–33)
AST (GOT): 18 U/L (ref 9–38)
Albumin: 4.7 g/dL (ref 3.5–5.2)
Alkaline Phosphatase (Total): 49 U/L (ref 34–121)
Anion Gap: 8 (ref 4–12)
Bilirubin (Total): 0.4 mg/dL (ref 0.2–1.3)
Calcium: 10 mg/dL (ref 8.9–10.2)
Carbon Dioxide, Total: 31 meq/L (ref 22–32)
Chloride: 104 meq/L (ref 98–108)
Creatinine: 0.84 mg/dL (ref 0.38–1.02)
Glucose: 91 mg/dL (ref 62–125)
Potassium: 4.2 meq/L (ref 3.6–5.2)
Protein (Total): 6.9 g/dL (ref 6.0–8.2)
Sodium: 143 meq/L (ref 135–145)
Urea Nitrogen: 21 mg/dL (ref 8–21)
eGFR by CKD-EPI 2021: >60 mL/min/{1.73_m2} (ref >59)

## 2022-12-24 LAB — B_TYPE NATRIURETIC PEPTIDE: B_Type Natriuretic Peptide: 30 pg/mL (ref <101)

## 2022-12-24 NOTE — Procedure Nursing Note (Signed)
7 day CAM given to pt as she preferred to place the monitor at home. [employer] tracking # [id number]

## 2022-12-24 NOTE — Progress Notes (Signed)
CARDIOLOGY CLINIC NOTE    Name:  Andreina Vibbert  Date of Birth: 1971/01/12  Medical Record Number: [id number]  Primary Care Physician:  Daine Floras, MD  Referring Provider:  Serita Butcher, *  Date of Service: 12/24/2022    ID  Yolanda Bowers is a 52 year old female with history of high-grade infiltrating ductal carcinoma of the left breast (ER+/PR+, FISH +) with angiolymphatic invasion, history of neoadjuvant chemotherapy with ddAC-THP, left mastectomy with lymph node biopsy, received 4 or 6 cycles of herceptin and Perjeta due to drop in EF, on letrozole, history of PVC ablation on 03/21/2020, here for follow-up.    Chief Complaint/Reason for Visit  Follow-Up     Last seen on 09/07/2022.    She has developed worsening dizziness even at rest.  She fell last Sunday when she was picking up a box.  Did not pass out.  Losing balance.  Different from postural dizziness.  No slurring of speech.  She notes fluid retention and weight gain at the ankles and started to take lasix daily.  She continues to have stressors at home during the divorce proceedings.  Appetite has not been good.      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      Anxiety [F41.9] 10/28/2020    PVC's (premature ventricular contractions) [I49.3] 02/18/2020     Ablation on 03/21/2020:  Successful ablation for PVCs from the interleaflet trigone in the LCC/RCC commisure below the aortic valve      Chemotherapy induced cardiomyopathy (HCC) [I42.7, T45.1X5A] 01/19/2020     Echo: 03/28/2019:  left ventricular end-diastolic dimension of 5.4cm, EF of 60%, strain -20%, trabeculated LV, normal valves, mild LAE, normal diastology    echocardiogram from 08/14/2019:  left ventricular end-diastolic dimension of 5.4cm, EF of 59%, GLS -20%, no change from 03/28/2019    Echocardiogram from 01/02/2020:   left  ventricular end-diastolic dimension of 5.6cm, EF of 51%, GLS -13%, normal RV function, normal valves, decreased function compared to 08/14/2019    Coronary angiogram from 03/20/2020:  normal    cMRI from 03/18/2020:  1. The left ventricle (LV) is mildly dilated (LVEDVi= 94 ml/m2) with low normal systolic function (LVEF= 53%). No regional wall motion abnormalities. There is no LV hypertrophy (LVmassI= 45 gm/m2).     2. The right ventricle (RV) is normal in size (RVEDVi= 83 ml/m2) with normal systolic function (RVEF= 52%).      3. On delayed enhancement imaging there is intramyocardial late gadolinium enhancement at the inferior RV attachment site.     4. On limited T2-weighted imaging there is no evidence of myocardial edema.     5. On T1-mapping, the native T1 value is  (normal).     6. No significant valvular abnormalities.     7. Left atrial area measures 25cm2 and LAVI= 18mL/m2 (dilated) via biplanar method; Right atrial area measures 14cm2 via 4-chamber monoplanar method.              History of left mastectomy [Z90.12] 08/20/2022    Encounter to discuss breast reconstruction [Z71.89] 08/20/2022    History of radiation therapy [Z92.3] 08/20/2022    Malignant neoplasm of upper-outer quadrant of left breast in female, estrogen receptor positive (HCC) [C50.412, Z17.0] 11/18/2019    CIN I (cervical intraepithelial neoplasia I) [  N87.0] 06/01/2018     Review of patient's allergies indicates:  Allergies   Allergen Reactions    Oxycodone Skin: Itching    Codeine XL:[id number]    Lisinopril Resp: Cough    Walnut Skin: Rash    Paclitaxel Skin: Hives     Current Outpatient Medications   Medication Sig Dispense Refill    acetaminophen 500 MG tablet Take 1-2 tabs by mouth once daily as needed for pain.      aspirin 81 MG EC tablet Take 1 tablet (81 mg) by mouth.      cetirizine 10 MG tablet Take by mouth.      cyanocobalamin (Vitamin B-12) 500 MCG tablet Take 1 tablet (500 mcg) by mouth daily.      famotidine 20 MG  tablet Take 1 tablet (20 mg) by mouth as needed.      furosemide 20 MG tablet Take 1 tablet (20 mg) by mouth daily. 90 tablet 3    letrozole 2.5 MG tablet Take 1 tablet (2.5 mg) by mouth daily. 90 tablet 3    methocarbamol 500 MG tablet Take 1 tablet (500 mg) by mouth at bedtime as needed for muscle spasms. 30 tablet 2    metoprolol succinate ER 25 MG 24 hr tablet Please take 1/2 tablet(12.5) in the am and 1 tablet (25mg ) in the pm. Do not chew or crush. 135 tablet 3    potassium chloride ER 20 MEQ ER tablet Take 2 tablets (40 mEq) by mouth daily. 180 tablet 3    valsartan 40 MG tablet Take 1 tablet (40 mg) by mouth 2 times daily 180 tablet 1    venlafaxine ER 75 MG 24 hr capsule Take 1 capsule (75 mg) by mouth every morning. 90 capsule 0     No current facility-administered medications for this visit.       Review of Systems:  A complete review of systems was otherwise negative except as noted above.   Reviewed initial clinical health assessment    FAMILY HISTORY:  Premature coronary artery disease:  Very distant  diabetes : mother  Hypertension: mother  Stroke: grandmother (paternal)  Cancer:  Skin cancer (father), grandfather (bone cancer)    SOCIAL HISTORY:  Wellsite geologist, non-smoker, no alcohol, no recreational drug use      Physical Exam:  Wt Readings from Last 3 Encounters:   12/24/22 77.1 kg (170 lb)   09/07/22 74.8 kg (165 lb)   08/30/22 76 kg (167 lb 8.8 oz)     BP 105/69   Pulse 72   Temp 36.3 C (Temporal)   Ht 5\' 6"  (1.676 m)   Wt 77.1 kg (170 lb)   SpO2 97%   BMI 27.44 kg/m     General: well developed female  in no acute distress  Eyes:  Anicteric sclera, PERRLA  Neck: No masses, normal carotid pulses without audible bruit, thyroid gland is normal in size  Chest:  Lungs are clear to auscultation without wheezes, rales, or rhonchi  Cardiovascular: regular rate and rhythm, normal S1 and S2, no S3 or S4, 1/6 holosystolic murmur, PMI felt midclavicular, JVP of 5-6 cm  Abdomen: soft non-tender,  non-distended with normal bowel sounds, liver span felt at the costal margin, no splenomegaly  Extremities: warm to touch bilaterally with +2 pulses in the femoral, popliteal, tibial, dorsalis pedis bilaterally , no edema bilaterally  Musculoskeletal:  Back is symmetric, no curvature, no tenderness.  Skin:  Normal skin color, texture.  No rashes or lesions  Neurologic:  Gait is normal, sensation grossly normal  Psychiatric:  Appropriate and responds to questions    Laboratory Results:   Results for orders placed or performed in visit on 12/24/22   EKG 12-LEAD   Result Value Ref Range    Ventricular Rate 70 BPM    Atrial Rate 70 BPM    P-R Interval 140 ms    QRS Duration 82 ms    Q-T Interval 406 ms    QTC Calculation 438 ms    P Axis 37 degrees    R Axis 83 degrees    T Axis 78 degrees     Results for orders placed or performed in visit on 02/11/22   Lipid Panel   Result Value Ref Range    Total Cholesterol 201 (H) <200 mg/dL    Triglyceride 161 (H) <150 mg/dL    HDL Cholesterol 85 >09 mg/dL    Non-HDL Cholesterol 116 0 - 159 mg/dL    LDL Cholesterol, NIH Equation 89 <130 mg/dL    Cholesterol/HDL Ratio 2.4     Lipid Panel, Additional Info. (NOTE)          Imaging:    Echocardiogram   07/29/2020:  Normal LV size and functoin EF of 61% with GLS of -19%, normal RV size and function, minimal MR..  EF has improved from 12/2019.    08/21/21:  left ventricular end-diastolic dimension 5cm EF of 60%, GLS -15-19%%, normal RV size and function, atrial septal aneurysm is present    09/07/2022:  normal biventricular function, normal PAPs, atrial septal aneurysm present    Holter monitor   07/29/2020:  Predominant rhythm: NSR   Atrial Tachycardia (AT) 13 episodes, Longest 5 beats @ Avg  83 bpm up to 103 bpm, Fastest 4 beats @ Avg 143 bpm up to  177 bpm   Accelerated Idioventricular Rhythm (AIVR)   PAC 0.03 %   PVC 0.11 %     06/25/2021:  Predominant rhythm: NSR   Atrial Tachycardia (AT) 9 episodes, Longest 6 beats @ Avg  116 bpm up to  137 bpm, Fastest 4 beats @ Avg 157 bpm up to  209 bpm   Ectopic Atrial Rhythm (EAR)   PAC 0.03 %   PVC 0.04 %    04/08/2022:  Predominant rhythm: NSR  - Atrial Tachycardia (AT) 11 episodes, Longest 20 beats @ Avg 151 bpm up to 174 bpm, Fastest 5 beats @ Avg 177 bpm up to 201  bpm  - Ectopic Atrial Rhythm (EAR)  - PAC <0.1 %  - PVC 0.2 %    Patient activation for SR, EAR, PACs, and PVCs.    EKG:  01/11/2020: Normal sinus rhythm, heart rate of 74 bpm, normal PR interval, premature ventricular contractions, LAE    12/24/2022:  normal sinus rhythm heart rate 70 bpm, normal axis, normal PR interval, normal EKG.     cardiopulmonary exercise test on 05/30/2020:  Max VO2 15 ml/kg/min (66%) , RER 1.21, normal blood pressure response to exercise, normal heart rate response, O2 pulse of 82%, 30% MVV     GDMT:  ACEI/ARB/ARNI: valsartan 40mg  BID  Beta-blockers: toprol XL 12.5mg /25mg  daily  MRAs:  none--> spironolactone 12.5mg  dialy  SGLT2 inhibitor: none  Diuretics: furosemide 20mg  daily  Statin: none  Digoxin: none    Anticoagulation:  Aspirin 81mg  daily    Assessment:   Probable herceptin-induced cardiomyopathy with EF decrement to 51% now back to normal 60-65%  History of VT   PVC ablation  03/21/2020  High-grade infiltrating ductal carcinoma of the left breast (ER+/PR+, FISH +) with angiolymphatic invasion, history of neoadjuvant chemotherapy with ddAC-THP and herceptin/perjeta (completed 4/6 cycles due to decrement in EF)  left mastectomy with lymph node biopsy  dizziness   Stressors  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  No genetic cause for cardiomyopathy or arrhythmias    Ms. Perisho has ongoing dizziness, but reports these episodes are different from her postural dizziness in the past.  She also notes clumsiness and fell the other day when she carried a box.  No syncopal event.  She is also noting balance issues.  We will obtain a holter monitor to assess for arrhythmia burden.  I  will also reach out to her oncologist regarding change in symptoms to see if additional imaging is warranted (e.g. brain MRI).    Recent echocardiogram from 08/2022 shows normal biventricular function so I do not expect there has been any significant changes.  cMRI did not reveal LGE, but with her prior history of VT, we need to confirm she has not had any recurrence and reconsideration for ablation/antiarrhythmic therapy..      Plan:  Repeat 7 day holter to see if she has a different arrhythmia profile.    Discuss with Dr. Wadie Lessen regarding her imbalance issues and need for imaging  Continue furosemide 20mg  daily  Continue GDMT as noted above  Continue aspirin 81mg  daily for atrial septal aneurysm.  Monitor for TIA symptoms.  Modify stressors    Follow-up:  Follow-up in 2 months.   Patient will call sooner if symptoms change.    Brigid Re, MD  Division of Cardiology  Advanced Heart Failure/Cardiac Transplantation  12/24/2022

## 2022-12-24 NOTE — Patient Instructions (Signed)
We are going to figure out the cause of your dizziness    7 day holter to exclude arrhythmias    Monitor your blood pressure as you are doing    Will reach out to Dr. Wadie Lessen regarding additional neurological workup if indicated

## 2022-12-27 LAB — EKG 12 LEAD
Atrial Rate: 70 {beats}/min
P Axis: 37 degrees
P-R Interval: 140 ms
Q-T Interval: 406 ms
QRS Duration: 82 ms
QTC Calculation: 438 ms
R Axis: 83 degrees
T Axis: 78 degrees
Ventricular Rate: 70 {beats}/min

## 2023-01-06 DIAGNOSIS — I427 Cardiomyopathy due to drug and external agent: Secondary | ICD-10-CM

## 2023-01-06 DIAGNOSIS — T451X5A Adverse effect of antineoplastic and immunosuppressive drugs, initial encounter: Secondary | ICD-10-CM

## 2023-01-10 NOTE — Result Encounter Note (Signed)
Please contact the patient to find out whether her symptoms of dizziness has improved.  Her holter monitor was unremarkable.  thanks

## 2023-01-11 ENCOUNTER — Telehealth (HOSPITAL_BASED_OUTPATIENT_CLINIC_OR_DEPARTMENT_OTHER): Payer: Self-pay | Admitting: Cardiovascular Disease

## 2023-01-11 NOTE — Telephone Encounter (Signed)
Called patient to check in LVM. Patient updated on unremarkable holter monitor and patient to call back to update on how she is doing. Mychart message sent as well.

## 2023-01-30 ENCOUNTER — Other Ambulatory Visit (HOSPITAL_BASED_OUTPATIENT_CLINIC_OR_DEPARTMENT_OTHER): Payer: Self-pay | Admitting: Medical

## 2023-01-30 DIAGNOSIS — I429 Cardiomyopathy, unspecified: Secondary | ICD-10-CM

## 2023-01-30 DIAGNOSIS — E876 Hypokalemia: Secondary | ICD-10-CM

## 2023-02-01 MED ORDER — POTASSIUM CHLORIDE ER 20 MEQ OR TBCR
40.0000 meq | EXTENDED_RELEASE_TABLET | Freq: Every day | ORAL | 2 refills | Status: DC
Start: 2023-02-01 — End: 2023-10-20

## 2023-02-11 NOTE — Telephone Encounter (Signed)
Dr Konrad Dolores is ok with patient stopping lasix. Mychart message sent to patient.

## 2023-02-14 ENCOUNTER — Ambulatory Visit: Payer: 59 | Attending: Physician Assistant | Admitting: Physician Assistant

## 2023-02-14 ENCOUNTER — Ambulatory Visit (HOSPITAL_BASED_OUTPATIENT_CLINIC_OR_DEPARTMENT_OTHER): Payer: 59

## 2023-02-14 DIAGNOSIS — M79671 Pain in right foot: Secondary | ICD-10-CM | POA: Insufficient documentation

## 2023-02-14 DIAGNOSIS — C50812 Malignant neoplasm of overlapping sites of left female breast: Secondary | ICD-10-CM | POA: Insufficient documentation

## 2023-02-14 DIAGNOSIS — R42 Dizziness and giddiness: Secondary | ICD-10-CM | POA: Insufficient documentation

## 2023-02-14 DIAGNOSIS — R600 Localized edema: Secondary | ICD-10-CM | POA: Insufficient documentation

## 2023-02-14 DIAGNOSIS — Z170 Estrogen receptor positive status [ER+]: Secondary | ICD-10-CM

## 2023-02-14 DIAGNOSIS — Z9012 Acquired absence of left breast and nipple: Secondary | ICD-10-CM | POA: Insufficient documentation

## 2023-02-14 DIAGNOSIS — R2689 Other abnormalities of gait and mobility: Secondary | ICD-10-CM | POA: Insufficient documentation

## 2023-02-14 DIAGNOSIS — C50412 Malignant neoplasm of upper-outer quadrant of left female breast: Secondary | ICD-10-CM

## 2023-02-14 DIAGNOSIS — M79672 Pain in left foot: Secondary | ICD-10-CM | POA: Insufficient documentation

## 2023-02-14 DIAGNOSIS — M549 Dorsalgia, unspecified: Secondary | ICD-10-CM | POA: Insufficient documentation

## 2023-02-14 DIAGNOSIS — Z733 Stress, not elsewhere classified: Secondary | ICD-10-CM | POA: Insufficient documentation

## 2023-02-14 LAB — CBC, DIFF
% Basophils: 1 %
% Eosinophils: 4 %
% Immature Granulocytes: 0 %
% Lymphocytes: 27 %
% Monocytes: 8 %
% Neutrophils: 60 %
% Nucleated RBC: 0 %
Absolute Eosinophil Count: 0.11 10*3/uL (ref 0.00–0.50)
Absolute Lymphocyte Count: 0.78 10*3/uL — ABNORMAL LOW (ref 1.00–4.80)
Basophils: 0.03 10*3/uL (ref 0.00–0.20)
Hematocrit: 37 % (ref 36.0–45.0)
Hemoglobin: 12.5 g/dL (ref 11.5–15.5)
Immature Granulocytes: 0.01 10*3/uL (ref 0.00–0.05)
MCH: 29.7 pg (ref 27.3–33.6)
MCHC: 33.7 g/dL (ref 32.2–36.5)
MCV: 88 fL (ref 81–98)
Monocytes: 0.23 10*3/uL (ref 0.00–0.80)
Neutrophils: 1.76 10*3/uL — ABNORMAL LOW (ref 1.80–7.00)
Nucleated RBC: 0 10*3/uL
Platelet Count: 190 10*3/uL (ref 150–400)
RBC: 4.21 10*6/uL (ref 3.80–5.00)
RDW-CV: 12.2 % (ref 11.0–14.5)
WBC: 2.84 10*3/uL — ABNORMAL LOW (ref 4.3–10.0)

## 2023-02-14 LAB — COMPREHENSIVE METABOLIC PANEL
ALT (GPT): 16 U/L (ref 7–33)
AST (GOT): 19 U/L (ref 9–38)
Albumin: 4.4 g/dL (ref 3.5–5.2)
Alkaline Phosphatase (Total): 39 U/L (ref 34–121)
Anion Gap: 6 (ref 4–12)
Bilirubin (Total): 0.6 mg/dL (ref 0.2–1.3)
Calcium: 9.6 mg/dL (ref 8.9–10.2)
Carbon Dioxide, Total: 29 meq/L (ref 22–32)
Chloride: 104 meq/L (ref 98–108)
Creatinine: 0.84 mg/dL (ref 0.38–1.02)
Glucose: 72 mg/dL (ref 62–125)
Potassium: 4.1 meq/L (ref 3.6–5.2)
Protein (Total): 6.7 g/dL (ref 6.0–8.2)
Sodium: 139 meq/L (ref 135–145)
Urea Nitrogen: 13 mg/dL (ref 8–21)
eGFR by CKD-EPI 2021: >60 mL/min/{1.73_m2} (ref >59)

## 2023-02-14 LAB — LAB ADD ON ORDER

## 2023-02-14 LAB — CBC (HEMOGRAM)
Hematocrit: 37 % (ref 36.0–45.0)
Hemoglobin: 12.5 g/dL (ref 11.5–15.5)
MCH: 29.7 pg (ref 27.3–33.6)
MCHC: 33.7 g/dL (ref 32.2–36.5)
MCV: 88 fL (ref 81–98)
Platelet Count: 190 10*3/uL (ref 150–400)
RBC: 4.21 10*6/uL (ref 3.80–5.00)
RDW-CV: 12.2 % (ref 11.0–14.5)
WBC: 2.84 10*3/uL — ABNORMAL LOW (ref 4.3–10.0)

## 2023-02-14 NOTE — Progress Notes (Unsigned)
[facility]  BREAST ONCOLOGY CLINIC NOTE    IDENTIFICATION  Yolanda Bowers is a 52 year old female with history of cT3N3 left breast invasive ductal carcinoma, ER/PR/HER2+, s/p neoadjuvant chemotherapy with ddAC-THP, left mastectomy and SLN biopsy, and adjuvant radiation therapy. She had a complete pathologic response at the time of surgery. Presents to clinic today for labs and follow up while on letrozole.     ONCOLOGIC HISTORY     Oncology History Overview Note   10/2018: [name]   11/24/2018: Bilateral mammogram revealed a negative right breast. To the left breast UOQ, there was an 8mm mass and a 2.4 cm mass laterally, 17 cm from the nipple. Targeted left breast ultrasound revealed at 3 o'clock 8 cm from the nipple, a 21x13x27mm mass and an enlarged axillary lymph node measuring 12x11x30mm. There were multiple enlarged lymph nodes  11/27/2018: US-guided biopsy of the breast mass revealed invasive ductal carcinoma, ER/PR+, HER2+, grade 3. There was angiolymphatic invasion seen on biopsy. Biopsy of axillary lymph node positive for carcinoma.   12/08/2018: Bilateral breast MRI revealed right breast at 3 o'clock, 2.7cm from the nipple, a 10 x 6 x48mm mass and at 12 o'clock, 11cm from the nipple, a spiculated 16 x 4 x 13mm mass. Ultrasound recommended.  To the left breast, mass consistent with biopsy proved carcinoma measuring 24 x 20 x 24mm. There was also diffuse NME involving mid-outer breast extending over 110x45x41mm. Within the NME, there was a small spiculated mass at 12 o'clock measuring 12mm. Multiple level 1,2 and 3 lymph nodes seen, in addition to at least 3 internal mammary nodes.   12/14/2018: PET CT without evidence of distant metastasis. There are 4mm lung nodules bilaterally.   12/29/2018: Started on neoadjuvant chemotherapy with ddAC x4 cycles.  03/09/2019: Started on neoadjuvant chemotherapy with Taxol, Herceptin, and  Perjeta.  06/25/2019: Underwent left mastectomy and SLN biopsy with Dr. Molli Hazard. Pathology revealed a single focus of angiolymphatic involvement by carcinoma (< 0.1cm). Negative surgical margins. 0 of 5 lymph nodes positive for carcinoma. ypT0 ypN0.   09/21/2019: Completed XRT.  Started on letrozole 2.5mg  PO daily.     01/29/2020: Given drop in LVEF, last Herceptin/Perjeta infusion was discontinued. Letrozole continued.    Negative genetic panel testing, 11/2018.     Malignant neoplasm of upper-outer quadrant of left breast in female, estrogen receptor positive (HCC)   11/07/2019 - 01/29/2020 Systemic Therapy    pertuzumab (Perjeta) 840 mg in sodium chloride 0.9 % 250 mL IVPB, Intravenous, 4 of 6 cycles  trastuzumab (Herceptin) 593 mg in sodium chloride 0.9 % 250 mL IVPB, Intravenous, 4 of 6 cycles     11/18/2019 Initial Diagnosis    Malignant neoplasm of upper-outer quadrant of left breast in female, estrogen receptor positive (HCC)          INTERVAL HISTORY  Yolanda Bowers was last seen by Dr. Wadie Lessen on 08/30/2022. In the interim, she states she has been well overall. She was having dizziness and  balance issues. Saw cards 5/3, repeat holter monitor unremarkable. Accidentally forgot lasix and felt better. Ok to hold lasix per cards. She notes her BP was off as well. She does feel better without Lasix but still takes it approximately every two weeks as needed. She still notes some dizziness/imbalance while being off Lasix as well, however. She notes occasional headaches but endorses stress with divorce. She is hydrating well. She is looking forwrad  to traveling to [place] soon and then to [place] later this summer.   She notes some SOB with exertion. She is active by gardening, swimming, and walking. She hasn't been doing yoga recently. She notes that her feet have been painful bilaterally with occasional ankle swelling. She is scheduled to see her PCP for this in the near future. 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 She will occasionally use methocarbamol or Tylenol.   She notes left armpit swelling for the past 1-2 months, worsening with time. She will wear her compression sleeve but this doesn't target the area. She will try massage as well without complete resolution.       ECOG Status: (0) Fully active, able to carry on all predisease performance without restriction     REVIEW OF SYSTEMS  A complete review of systems was conducted and is negative in detail, except as noted in the interval history.    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 Charlene Brooke    PR MASTECTOMY SIMPLE COMPLETE Left 06/25/2019    Had radiation        ALLERGIES  Review of patient's allergies indicates:  Allergies   Allergen Reactions    Oxycodone Skin: Itching    Codeine ZO:XWRUEA/[id number]    Lisinopril Resp: Cough    Walnut Skin: Rash    Paclitaxel Skin: Hives        MEDICATIONS    Current Outpatient Medications:     acetaminophen 500 MG tablet, Take 1-2 tabs by mouth once daily as needed for pain., Disp: , Rfl:     aspirin 81 MG EC tablet, Take 1 tablet (81 mg) by mouth., Disp: , Rfl:     cetirizine 10 MG tablet, Take by mouth., Disp: , Rfl:     cyanocobalamin (Vitamin B-12) 500 MCG tablet, Take 1 tablet (500 mcg) by mouth daily., Disp: , Rfl:     famotidine 20 MG tablet, Take 1 tablet (20 mg) by mouth as  needed., Disp: , Rfl:     furosemide 20 MG tablet, Take 1 tablet (20 mg) by mouth daily. (Patient not taking: Reported on 02/11/2023), Disp: 90 tablet, Rfl: 3    letrozole 2.5 MG tablet, Take 1 tablet (2.5 mg) by mouth daily., Disp: 90 tablet, Rfl: 3    methocarbamol 500 MG tablet, Take 1 tablet (500 mg) by mouth at bedtime as needed for muscle spasms., Disp: 30 tablet, Rfl: 2    metoprolol succinate ER 25 MG 24 hr tablet, Please take 1/2 tablet(12.5) in the am and 1 tablet (25mg ) in the pm. Do not chew or crush., Disp: 135 tablet, Rfl: 3    potassium chloride ER 20 MEQ ER tablet, Take 2 tablets (40  mEq) by mouth daily., Disp: 180 tablet, Rfl: 2    valsartan 40 MG tablet, Take 1 tablet (40 mg) by mouth 2 times daily, Disp: 180 tablet, Rfl: 1    venlafaxine ER 75 MG 24 hr capsule, Take 1 capsule (75 mg) by mouth., Disp: , Rfl:      SOCIAL HISTORY:   Social History     Tobacco Use    Smoking status: Never    Smokeless tobacco: Never   Substance Use Topics    Alcohol use: Not Currently     Comment: occasional wine , 2 glasses a month    Drug use: Not Currently      Two children, one in college     FAMILY HISTORY:    Cancer-related family history includes Ovarian Cancer (age of onset: 53) in her paternal aunt; Skin Cancer in her father and maternal grandfather.       PHYSICAL EXAM  Gustavus Messing, infusion APP, shadowing today and present for exam.  VITAL SIGNS: BP 106/64   Pulse 71   Temp 36.7 C   Resp 16   Wt 76.5 kg (168 lb 12.2 oz)   SpO2 98%   BMI 27.24 kg/m       GENERAL: Well appearing, well developed female in no acute distress.  HEENT: Normocephalic, atraumatic. Sclerae anicteric. Pupils are equal, round.   NECK: Neck is supple without any appreciable lymphadenopathy or thyromegaly.  LUNGS: Respirations regular and unlabored. Clear to auscultation bilaterally. No wheezing/rales/rhonci   CARDIAC: Regular rate and rhythm. No murmurs, gallops, or rubs.   BREASTS: s/p left mastectomy without reconstruction. No  concerning nodules or skin changes. Right breast without palpable masses, skin changes, or nipple changes. No axillary lymphadenopathy appreciated bilaterally.   ABDOMEN: Soft, non-tender, non-distended. Normoactive bowel sounds. No hepatosplenomegaly or masses appreciated.   EXTREMITIES: No edema, clubbing, or cyanosis.  SKIN: No visible rash, bruising, or petechiae.  MSK: Without muscle or joint deformities. No spinal or hip tenderness to palpation.   NEUROLOGICAL: Alert and oriented. Mental status appropriate.   NUTRITIONAL STATUS: Adequate.    LABORATORY  Results for orders placed or performed in visit on 02/14/23   Lab Add On Order, Non-Micro   Result Value Ref Range    Lab Test Requested differential     Specimen Type/Description Blood     Sample To Use Most recent     Test Request Status Order Processed    CBC with Diff   Result Value Ref Range    WBC 2.84 (L) 4.3 - 10.0 10*3/uL    RBC 4.21 3.80 - 5.00 10*6/uL    Hemoglobin 12.5 11.5 - 15.5 g/dL    Hematocrit 37 16.1 - 45.0 %    MCV 88 81 - 98 fL    MCH 29.7 27.3 - 33.6 pg    MCHC 33.7 32.2 - 36.5 g/dL    Platelet Count 096 045 - 400 10*3/uL    RDW-CV 12.2 11.0 - 14.5 %    % Neutrophils 60 %    % Lymphocytes 27 %    % Monocytes 8 %    % Eosinophils 4 %    % Basophils 1 %    % Immature Granulocytes 0 %    Neutrophils 1.76 (L) 1.80 - 7.00 10*3/uL    Absolute Lymphocyte Count 0.78 (L) 1.00 - 4.80 10*3/uL    Monocytes 0.23 0.00 - 0.80 10*3/uL    Absolute Eosinophil Count 0.11 0.00 - 0.50 10*3/uL  Basophils 0.03 0.00 - 0.20 10*3/uL    Immature Granulocytes 0.01 0.00 - 0.05 10*3/uL    Nucleated RBC 0.00 0.00 10*3/uL    % Nucleated RBC 0 %         ASSESSMENT/PLAN  Yolanda Bowers is a 52 year old female with history of cT3N3 left breast invasive ductal carcinoma, ER/PR/HER2+, s/p neoadjuvant chemotherapy with ddAC-THP, left mastectomy and SLN biopsy, and adjuvant radiation therapy. She had a complete pathologic response at the time of surgery. Presents to  clinic today for labs and follow up while on adjuvant letrozole.    1. Malignant neoplasm of upper-outer quadrant of left breast in female, estrogen receptor positive (HCC)  Karlisha is doing well overall but does have significant outside stress. Encouraged her to reach out if needed but she endorses a good support system. Continue to follow with counselor at [facility]. She does have continued side effects of dizziness and imbalance. Discussed getting a brain MRI. Continue to follow up with cardiology as well for BP management, which may be contributing.   Patient will discuss her bilateral foot pain with PCP. Back pain likely mechanical due to recent overuse and heavy listing. We discussed monitoring both and following up with [place] if there are persistent or worsening symptoms. Discussed that letrozole may be contributing to her foot pain, can consider holding if needed.   For left armpit swelling, I do not appreciate anything on exam. However, given the persistence >1 mo, recommend an [place] for evaluation. Right breast mammogram due in December.   Labs today with low WBC. Differential added on which showed a low ANC and lymphocyte. Recommended repeating in 4 weeks.   DEXA scan due 12/2023.    Survivorship:  - Counseled on regular exercise and dietary measures to reduce risk of recurrence.    Orders Placed This Encounter    US Breast Axilla Limited Left    MRI Brain w wo Contrast    Mammography Screening w/Tomo Right    Lab Add On Order, Non-Micro    CBC with Diff    CBC with Differential    Clinic Visit Nurse Results Review    Clinic Visit Physician      DISPOSITION  Patient to return to clinic in 6 months for follow up and surveillance, or sooner if needed. Patient encouraged to contact clinic in the interim with any questions or concerns.    TIME STATEMENT: I spent 45 minutes in total for patient's care today, including preparation, time with patient, and charting.

## 2023-02-15 ENCOUNTER — Ambulatory Visit: Payer: 59

## 2023-02-16 ENCOUNTER — Telehealth (HOSPITAL_BASED_OUTPATIENT_CLINIC_OR_DEPARTMENT_OTHER): Payer: Self-pay | Admitting: Cardiovascular Disease

## 2023-02-16 NOTE — Telephone Encounter (Signed)
Left voicemail for patient regarding request to move their appointment with Dr. Minami from 7/18 to 7/16 at noon per Ariana's request. This is do to a different scheduling issue going on. I made clear in the message that if they cannot move, that is okay and they can keep their appointment as scheduled.    Transfer call to Mahari Vankirk    Sending MyChart message

## 2023-02-26 ENCOUNTER — Encounter (HOSPITAL_BASED_OUTPATIENT_CLINIC_OR_DEPARTMENT_OTHER): Payer: Self-pay | Admitting: Physician Assistant

## 2023-03-06 ENCOUNTER — Other Ambulatory Visit (HOSPITAL_BASED_OUTPATIENT_CLINIC_OR_DEPARTMENT_OTHER): Payer: Self-pay | Admitting: Cardiovascular Disease

## 2023-03-06 DIAGNOSIS — I429 Cardiomyopathy, unspecified: Secondary | ICD-10-CM

## 2023-03-07 ENCOUNTER — Ambulatory Visit: Payer: 59

## 2023-03-09 MED ORDER — VALSARTAN 40 MG OR TABS
ORAL_TABLET | ORAL | 1 refills | Status: DC
Start: 2023-03-09 — End: 2023-09-02
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
## 2023-03-10 ENCOUNTER — Ambulatory Visit: Payer: 59

## 2023-03-10 ENCOUNTER — Other Ambulatory Visit (HOSPITAL_BASED_OUTPATIENT_CLINIC_OR_DEPARTMENT_OTHER): Payer: Self-pay | Admitting: Physician Assistant

## 2023-03-10 ENCOUNTER — Ambulatory Visit: Payer: 59 | Attending: Cardiovascular Disease | Admitting: Cardiovascular Disease

## 2023-03-10 VITALS — BP 115/72 | HR 62 | Temp 97.2°F | Ht 66.0 in | Wt 169.4 lb

## 2023-03-10 DIAGNOSIS — C50412 Malignant neoplasm of upper-outer quadrant of left female breast: Secondary | ICD-10-CM

## 2023-03-10 DIAGNOSIS — T451X5A Adverse effect of antineoplastic and immunosuppressive drugs, initial encounter: Secondary | ICD-10-CM

## 2023-03-10 DIAGNOSIS — F419 Anxiety disorder, unspecified: Secondary | ICD-10-CM

## 2023-03-10 DIAGNOSIS — I493 Ventricular premature depolarization: Secondary | ICD-10-CM

## 2023-03-10 DIAGNOSIS — I429 Cardiomyopathy, unspecified: Secondary | ICD-10-CM | POA: Insufficient documentation

## 2023-03-10 DIAGNOSIS — I427 Cardiomyopathy due to drug and external agent: Secondary | ICD-10-CM

## 2023-03-10 LAB — COMPREHENSIVE METABOLIC PANEL
ALT (GPT): 15 U/L (ref 7–33)
AST (GOT): 18 U/L (ref 9–38)
Albumin: 4.5 g/dL (ref 3.5–5.2)
Alkaline Phosphatase (Total): 39 U/L (ref 34–121)
Anion Gap: 6 (ref 4–12)
Bilirubin (Total): 0.3 mg/dL (ref 0.2–1.3)
Calcium: 9.6 mg/dL (ref 8.9–10.2)
Carbon Dioxide, Total: 29 meq/L (ref 22–32)
Chloride: 104 meq/L (ref 98–108)
Creatinine: 0.75 mg/dL (ref 0.38–1.02)
Glucose: 84 mg/dL (ref 62–125)
Potassium: 4.2 meq/L (ref 3.6–5.2)
Protein (Total): 7 g/dL (ref 6.0–8.2)
Sodium: 139 meq/L (ref 135–145)
Urea Nitrogen: 16 mg/dL (ref 8–21)
eGFR by CKD-EPI 2021: >60 mL/min/{1.73_m2} (ref >59)

## 2023-03-10 LAB — LIPID PANEL
Cholesterol/HDL Ratio: 2.5
HDL Cholesterol: 71 mg/dL (ref >39)
LDL Cholesterol, NIH Equation: 93 mg/dL (ref <130)
Non-HDL Cholesterol: 106 mg/dL (ref 0–159)
Total Cholesterol: 177 mg/dL (ref <200)
Triglyceride: 69 mg/dL (ref <150)

## 2023-03-10 LAB — CBC, DIFF
% Basophils: 1 %
% Eosinophils: 2 %
% Immature Granulocytes: 0 %
% Lymphocytes: 24 %
% Monocytes: 7 %
% Neutrophils: 66 %
% Nucleated RBC: 0 %
Absolute Eosinophil Count: 0.07 10*3/uL (ref 0.00–0.50)
Absolute Lymphocyte Count: 0.79 10*3/uL — ABNORMAL LOW (ref 1.00–4.80)
Basophils: 0.02 10*3/uL (ref 0.00–0.20)
Hematocrit: 39 % (ref 36.0–45.0)
Hemoglobin: 12.6 g/dL (ref 11.5–15.5)
Immature Granulocytes: 0.01 10*3/uL (ref 0.00–0.05)
MCH: 29.2 pg (ref 27.3–33.6)
MCHC: 32.5 g/dL (ref 32.2–36.5)
MCV: 90 fL (ref 81–98)
Monocytes: 0.22 10*3/uL (ref 0.00–0.80)
Neutrophils: 2.25 10*3/uL (ref 1.80–7.00)
Nucleated RBC: 0 10*3/uL
Platelet Count: 203 10*3/uL (ref 150–400)
RBC: 4.32 10*6/uL (ref 3.80–5.00)
RDW-CV: 12.1 % (ref 11.0–14.5)
WBC: 3.36 10*3/uL — ABNORMAL LOW (ref 4.3–10.0)

## 2023-03-10 LAB — MAGNESIUM: Magnesium: 2 mg/dL (ref 1.8–2.4)

## 2023-03-10 LAB — B_TYPE NATRIURETIC PEPTIDE: B_Type Natriuretic Peptide: 25 pg/mL (ref <101)

## 2023-03-10 NOTE — Patient Instructions (Addendum)
-   Increase metoprolol to 25mg  twice a day for palpitations, these are likely due to premature atrial beats.    - You should be okay to stop potassium chloride    Good to meet you!  Solmon Ice, DNP, ARNP  Division of Cardiology  Advanced Heart Failure/Cardiac Transplant

## 2023-03-11 ENCOUNTER — Encounter (HOSPITAL_BASED_OUTPATIENT_CLINIC_OR_DEPARTMENT_OTHER): Payer: Self-pay

## 2023-03-19 ENCOUNTER — Encounter (HOSPITAL_BASED_OUTPATIENT_CLINIC_OR_DEPARTMENT_OTHER): Payer: Self-pay | Admitting: Physician Assistant

## 2023-03-21 ENCOUNTER — Other Ambulatory Visit (HOSPITAL_BASED_OUTPATIENT_CLINIC_OR_DEPARTMENT_OTHER): Payer: Self-pay | Admitting: Nurse Practitioner

## 2023-03-21 DIAGNOSIS — Z170 Estrogen receptor positive status [ER+]: Secondary | ICD-10-CM

## 2023-04-11 ENCOUNTER — Ambulatory Visit: Payer: 59

## 2023-04-11 ENCOUNTER — Other Ambulatory Visit (INDEPENDENT_AMBULATORY_CARE_PROVIDER_SITE_OTHER): Payer: Self-pay

## 2023-04-11 DIAGNOSIS — Z7689 Persons encountering health services in other specified circumstances: Secondary | ICD-10-CM

## 2023-04-13 ENCOUNTER — Other Ambulatory Visit (HOSPITAL_COMMUNITY): Payer: Self-pay

## 2023-04-20 ENCOUNTER — Ambulatory Visit (INDEPENDENT_AMBULATORY_CARE_PROVIDER_SITE_OTHER): Payer: 59 | Admitting: Neurology

## 2023-04-20 DIAGNOSIS — Z170 Estrogen receptor positive status [ER+]: Secondary | ICD-10-CM

## 2023-04-20 DIAGNOSIS — C50412 Malignant neoplasm of upper-outer quadrant of left female breast: Secondary | ICD-10-CM

## 2023-04-20 NOTE — Patient Instructions (Signed)
Toshia, it was a pleasure to meet you today.  You described several types of dizziness:    - I think the lightheadedness and nausea that occur after your hot flashes are typical of hot flashes.  When your blood vessels dilate, blood goes to other places in your body and your brain briefly has less.    - The frequent tripping, inability to close your eyes in the shower, and loss of fine motor control are all related to the neuropathy (nerve damage) that you have from your chemo.  Unfortunately, the feeling in your hands and feet will probably not return to normal.  In order to make the best of what you have:   - Don't walk in the dark.   - Look at your feet every time you cut your toenails to make sure there are no cuts.   - Ask your oncologist to refer you to occupational therapy.  - You also talked about memory and thinking problems, which are probably also from the chemo.   - Ask your oncologist to refer you to speech therapy.     Take care,    Dr. Gwendlyn Deutscher

## 2023-04-20 NOTE — Progress Notes (Signed)
GENERAL NEUROLOGY OUTPATIENT CLINIC INITIAL VISIT NOTE      REASON FOR CONSULT:  We are asked by Lafayette Dragon, ARNP, to consult regarding dizziness.    PCP: Daine Floras, MD        HISTORY OF PRESENT ILLNESS   Yolanda Bowers is a 52 year old woman with metastatic left breast cancer currently in remission, cardiomyopathy, who presents with dizziness.    She is followed by the advanced heart failure clinic.  Their notes indicate that she had some dizziness which resolved when she discontinued furosemide.  She also had a Holter monitor which showed 13 episodes of atrial tachycardia.  They also suggested stress regarding her job and divorce might be contributing.     Today, patient reports several different situations in which she gets dizzy.  First, after she has a hot flash she will often feel lightheaded and faint.  Sometimes she feels lightheaded and faint even without a hot flash.  This does not seem to be dependent on her position, activity level, the weather temperature, etc.  Adjusting her heart failure medications did help with this, but incompletely.    She also reports that if she closes her eyes while washing her hair in the shower she will fall down unless she is touching something else.  She finds that she frequently falls if she is in a rush and walking too quickly.  She does not feel dizzy before these falls and she does not lose consciousness.  In general she denies room spinning dizziness.    She reports that during her chemotherapy both of her feet and hands got very numb.  The numbness is improved now but she still notices significant loss of fine motor control, which is particularly upsetting as she works as an Tourist information centre manager.    Socially, this is a difficult time for her.  She is still in the midst of a divorce and is putting in a protection order against her husband today.  Her 61 year old son is moving home to help her for which she is grateful.    PAST MEDICAL HISTORY    Past Medical History:   Diagnosis Date    Chemotherapy adverse reaction 06/25/2019    Allergic rhinitis due to allergen     Anemia     Anxiety     Breast cancer (HCC)     Burn     Cancer (HCC)     Congestive heart failure (HCC)     Headache     History of radiation therapy        PAST SURGICAL HISTORY:   Past Surgical History:   Procedure Laterality Date    BREAST BIOPSY Right 11/2018    2 core bx      BREAST RECONSTRUCTION Right 06/25/2019    reduction - Dr. Charlene Brooke    PR MASTECTOMY SIMPLE COMPLETE Left 06/25/2019    Had radiation       ALLERGIES  Review of patient's allergies indicates:  Allergies   Allergen Reactions    Oxycodone Skin: Itching    Codeine EA:VWUJWJ/XBJYNWGN    Lisinopril Resp: Cough    Paclitaxel Skin: Hives       HOME MEDICATIONS  Outpatient Medications Prior to Visit   Medication Sig Dispense Refill    acetaminophen 500 MG tablet Take 1-2 tabs by mouth once daily as needed for pain.      aspirin 81 MG EC tablet Take 1 tablet (81 mg) by mouth.  cetirizine 10 MG tablet Take by mouth.      cyanocobalamin (Vitamin B-12) 500 MCG tablet Take 1 tablet (500 mcg) by mouth daily.      famotidine 20 MG tablet Take 1 tablet (20 mg) by mouth as needed.      furosemide 20 MG tablet Take 1 tablet (20 mg) by mouth daily. (Patient not taking: Reported on 02/11/2023) 90 tablet 3    letrozole 2.5 MG tablet Take 1 tablet (2.5 mg) by mouth daily. 90 tablet 3    methocarbamol 500 MG tablet Take 1 tablet (500 mg) by mouth at bedtime as needed for muscle spasms. (Patient taking differently: Take 1 tablet (500 mg) by mouth at bedtime as needed for muscle spasms. PRN) 30 tablet 2    metoprolol succinate ER 25 MG 24 hr tablet Please take 1/2 tablet(12.5) in the am and 1 tablet (25mg ) in the pm. Do not chew or crush. 135 tablet 3    potassium chloride ER 20 MEQ ER tablet Take 2 tablets (40 mEq) by mouth daily. (Patient not taking: Reported on 04/20/2023) 180 tablet 2    valsartan 40 MG tablet Take 1 tablet (40 mg) by  mouth 2 times daily 180 tablet 1    venlafaxine ER 75 MG 24 hr capsule Take 1 capsule (75 mg) by mouth.       No facility-administered medications prior to visit.       SOCIAL HISTORY   Social History     Tobacco Use    Smoking status: Never    Smokeless tobacco: Never   Substance Use Topics    Alcohol use: Not Currently     Comment: occasional wine , 2 glasses a month    Drug use: Not Currently       In the process of a contentious divorce.  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      FAMILY HISTORY   Family History       Problem (# of Occurrences) Relation (Name,Age of Onset)    Diabetes (1) Mother    Alcohol/Drug (1) Maternal Grandfather    Hearing Loss (2) Mother, Father    Ovarian Cancer (1) Paternal Aunt (45)    Obesity (2) Mother, Maternal Grandfather    Skin Cancer (2) Father, Maternal Grandfather            REVIEW OF SYSTEMS   A complete review of systems has been asked and was negative except as stated above in the HPI and clinic forms.    PHYSICAL EXAM   Vitals:    04/20/23 1018   BP: 102/68   Pulse: 67   SpO2: 97%   Weight: 77.1 kg (170 lb)   Height: 5\' 6"  (1.676 m)       Gen: Well-appearing, no acute distress.  HEENT:  Atraumatic, moist mucous membranes.  CV: Warm, well-perfused.   Lungs: Breathing comfortably on room air.  No accessory muscle use.  No spontaneous cough.     NEURO:  Mental status:  Awake, alert, and oriented to person, place, date.  Fluent speech without paraphasic errors.  Recall 2/3 at 3 minutes.  Able to give the months backwards.  Able to follow complex commands crossing the midline.   Cranial nerves:   III/IV/VI: EOMI without diplopia  V: Intact and symmetric sensation in V1/V2/V3.   VII: Facial expressions intact and symmetric.   VIII: Hearing grossly intact to speech.  IX/X: No dysarthria    Motor exam:  Abnormal movements: None   No pronator drift.    5/5 strength in the deltoids, biceps, triceps, wrist flexion/extension, IO, APB, hip flexors, knee flexion/extension b/l.   Able to do several calf raises on both feet.     Sensation: Decreased sensation to pinprick in the right hand, left entire arm and hand, bilateral feet.  Decreased vibratory sensation to 6 seconds at bilateral hallux.  Sway with Romberg.     Cerebellar: Normal FTN. No nystagmus. No truncal ataxia. Able to tandem walk.    Reflexes: 2+, symmetric without spread in the bilateral biceps, triceps, brachioradialis, patellars, trace at Achilles.  Toes downgoing bilaterally.    Gait: Stable, narrow-based, 14ft stride and 2-3 point turns.     LABS:  Normal T4 09/07/22  A1c 5.1 2023  B12 330 2022    IMAGING/DIAGNOSTICS:  MRI brain with and without contrast  IMPRESSION:     1. Stable MR brain examination. No acute intracranial abnormality or   evidence of metastatic disease to the brain.     2.  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     ASSESSMENT AND PLAN  This is a 52 year old woman with metastatic breast cancer currently on maintenance treatment, chemotherapy induced cardiomyopathy and neuropathy, who presents with symptoms largely related to sensory ataxia.    Her exam is notable for significantly decreased sensation to pinprick and vibration in her bilateral feet, which explains her frequent tripping and feeling of unsteadiness when she closes her eyes in the shower.  Similarly, that she has decreased sensation to pinprick in both of her hands, which explains her worsened fine motor coordination.  I explained to her that unfortunately nerve damage from any cause frequently does not heal completely.  I recommended that she go to occupational therapy in order to learn strategies to make the most of the feeling that remains in her hands.    Her episodes of feeling faint and nauseated do not seem to be orthostatic.  I suggested that they might be related to her hot flashes, cardiomyopathy, and/or the significant social stress that she is currently experiencing.    Given the lack of room spinning vertigo and normal cerebellum on  MRI, I do not think there is any intracranial process that is causing her symptoms.  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    She also complained of significant cognitive difficulties, largely memory and communication, ever since her chemotherapy.  I let her know that this is also a common side effect of chemotherapy and recommended speech therapy for cognitive communication skills.  I suggested that she go through her oncology providers for occupational and speech therapy as I believe [facility] has therapists who are experienced in treating cancer patients.  She is already well plugged in for mental health.    -Recommend [facility] referral to occupational and speech therapy  -RTC only if symptoms worsen

## 2023-04-21 NOTE — Addendum Note (Signed)
Addended by: Tommie Raymond on: 04/21/2023 04:41 PM     Modules accepted: Orders

## 2023-04-30 ENCOUNTER — Other Ambulatory Visit (HOSPITAL_BASED_OUTPATIENT_CLINIC_OR_DEPARTMENT_OTHER): Payer: Self-pay | Admitting: Cardiovascular Disease

## 2023-04-30 DIAGNOSIS — I429 Cardiomyopathy, unspecified: Secondary | ICD-10-CM

## 2023-05-02 ENCOUNTER — Encounter (HOSPITAL_BASED_OUTPATIENT_CLINIC_OR_DEPARTMENT_OTHER): Payer: Self-pay

## 2023-05-03 ENCOUNTER — Telehealth (HOSPITAL_BASED_OUTPATIENT_CLINIC_OR_DEPARTMENT_OTHER): Payer: Self-pay | Admitting: Cardiovascular Disease

## 2023-05-03 MED ORDER — METOPROLOL SUCCINATE ER 25 MG OR TB24
25.0000 mg | EXTENDED_RELEASE_TABLET | Freq: Two times a day (BID) | ORAL | 3 refills | Status: DC
Start: 2023-05-03 — End: 2023-05-06

## 2023-05-03 NOTE — Telephone Encounter (Signed)
Called in short 14 day supply to [facility]. Spoke with Psychologist, sport and exercise. Called patient to update LVM.

## 2023-05-03 NOTE — Telephone Encounter (Signed)
Pt called to request refill of METOPROLOL (normally mail order).  Pt did not receive if Rx was mail ordered.      Pt is out of Rx at this time.      Please send Rx script to:      [facility]  [address] [place], [place] [address]   [phone]

## 2023-05-04 ENCOUNTER — Other Ambulatory Visit (HOSPITAL_BASED_OUTPATIENT_CLINIC_OR_DEPARTMENT_OTHER): Payer: Self-pay | Admitting: Cardiovascular Disease

## 2023-05-04 DIAGNOSIS — I429 Cardiomyopathy, unspecified: Secondary | ICD-10-CM

## 2023-05-06 MED ORDER — METOPROLOL SUCCINATE ER 25 MG OR TB24
25.0000 mg | EXTENDED_RELEASE_TABLET | Freq: Two times a day (BID) | ORAL | 3 refills | Status: DC
Start: 2023-05-06 — End: 2024-05-01

## 2023-05-06 NOTE — Telephone Encounter (Signed)
Dispensing report does not show 05/03/23 Rx. Resent.

## 2023-06-10 NOTE — ED Triage Notes (Signed)
 9:08 PM PDT  ED Triage Note    Yolanda Bowers 52 y.o. female presents in the ED via POV with c/o L arm swelling. Pt states she's had multiple lumps on her L arm with redness and swelling. Pt has Hx of breast cancer and HF. Pt states that lumps appeared a month ago with new lumps appearing 2 days ago.    Lynwood FERNS., RN  06/10/2023

## 2023-06-10 NOTE — ED Provider Notes (Signed)
[facility]    Patient Name: Yolanda Bowers  Medical Record Number: [id number]  Patient's Phone Number: [phone] (home)   Visit Date/Time: June 11, 2023 at 9:00 pm  Mode of Arrival: Car  Primary Care Provider: Serita Butcher, PA      ASSESSMENT & TREATMENT SUMMARY     Nursing Chief Complaint:   Chief Complaint   Patient presents with   . Arm Pain       Clinical Impression:   1. Cellulitis of left forearm        New Prescriptions:   Discharge Medication List as of 06/10/2023 11:36 PM        START taking these medications    Details   cephalexin (KEFLEX) 500 mg capsule Take 2 capsules by mouth 3 times daily for 7 days.Disp-42 capsule, R-0, Normal             Follow-up Care:   Serita Butcher, PA  [address] [place] [id number]  [place] [address]  [phone]    Go in 2 days  For re-check    [facility]  [address]  [place] [address]  [phone]  Go to   As needed or if symptoms worsen    discharge    Disposition:    Jenalyn Abbiegail Hampel is discharged to home in stable condition.  Discharge diagnosis, instructions and plan were discussed and understood.  The patient [name] understood to return immediately to the emergency department if the symptoms worsen or if they have any additional concerns.    HISTORY OF PRESENT ILLNESS   Nursing Triage Note:    ED Triage Notes by Sharyn Creamer, RN at 06/10/23 2107          9:08 PM PDT  ED Triage Note    Yolanda Bowers 52 y.o. female presents in the ED via POV with c/o L arm swelling. Pt states she's had multiple lumps on her L arm with redness and swelling. Pt has Hx of breast cancer and HF. Pt states that lumps appeared a month ago with new lumps appearing 2 days ago.    Yolanda Halter., RN  06/10/2023             HPI/Additional History:     Danaka Danelly Reidinger is a 52 y.o. female with history of breast cancer status post left mastectomy who presents to the Emergency  Department with 2 days left lateral forearm erythema and induration.  It is mildly tender. Not itchy.     Patient denies fever, chills, cough, headache, visual changes, chest pain, shortness of breath, near-syncopal or syncopal episodes. They have had no nausea or vomiting. No new numbness, or weakness.      PAST HISTORY     Past Medical History:   Breast cancer  Chemo-induced heart failure  Radiation induced pulmonary fibrosis    Past Surgical History:  Left mastectomy    Home Medications list:   No current outpatient medications on file prior to encounter.       Allergies:   Allergies   Allergen Reactions   . Oxycodone Itching   . Codeine Nausea And Vomiting and Nausea Only   . Lisinopril Cough   . Paclitaxel Hives   . Walnut Rash       PHYSICAL EXAM     Initial triage vitals:  Temp: 35.6 C (96 F) Pulse: 72 Resp: 18 BP: 129/79 SpO2: 97 %  Last four vital signs:  Vitals:    06/10/23 2111 06/10/23 2345   BP: 129/79 107/72   Pulse: 72 62   Resp: 18 20   Temp: 35.6 C (96 F)    TempSrc: Temporal    SpO2: 97% 96%   Weight: 78.2 kg (172 lb 6.4 oz)    Height: 1.676 m (5\' 6" )         Initial vital signs reviewed and interpreted as borderline hypertensive likely secondary to emergency department environment.  General Appearance: Patient found resting quietly and comfortably in no acute distress.  HEENT: Non-jaundiced, anicteric. Pupils equal round reactive, EOM's intact. Moist mucous membranes.  Neck: Supple, no meningismus.  Respiratory: Lungs clear to auscultation bilaterally. No wheezes, rhonchi or rales.  No increased work of breathing.  CVS: Regular rate and rhythm. No murmurs, rubs, or gallops. +2 pulses in bilateral upper extremities, less than 2 seconds capillary refill.  Abdomen:  Soft and nontender.  No rebound, guarding or rigidity. No CVA tenderness.  Musculoskeletal: She  is neurovascularly intact with +2 radial pulse and <2 seconds capillary refill. No deficits throughout the radial, median, and ulnar  nerve distribution.Otherwise, full range of motion.  No additional tenderness to palpation or bony deformity.  Neuro: Alert and oriented, CN II-XII appear intact with 5/5 motor strength in all extremities and no gross sensory deficits.  No truncal ataxia.   Psych:  Appropriate affect. Not responding to internal stimuli.   Skin: Well-demarcated blanching erythema and slight induration and 2 subtle palpable nodules left proximal forearm radial aspect.  Total body surface area less than 1%.  No palpable fluctuance.  Otherwise, warm and dry without rash or jaundice.    RESULTS     Laboratory Studies: Ordered and independently interpreted laboratory tests.    Recent Results (from the past 24 hour(s))   POC Group Chem 8+ (HGB,HCT,BMP-ICA)   Result Value Ref Range    Sodium, POC 140 138 - 146 mmol/L    Potassium, POC 3.4 (L) 3.5 - 4.9 mmol/L    Chloride, POC 101 98 - 109 mmol/L    TCO2, POC 27 24 - 29 mmol/L    Anion Gap, POC 17 10 - 20 mmol/L    BUN, POC 14 8 - 26 mg/dL    Creatinine, POC 0.9 0.6 - 1.3 mg/dL    Glucose, POC 161 (H) 70 - 105 mg/dL    Ionized Calcium, POC 1.28 1.12 - 1.32 mmol/L    Hemoglobin, POC 12.9 12.0 - 17.0 g/dL    Hematocrit, POC 38 38 - 51 %PCV   Extra Blue Top Tube   Result Value Ref Range    Extra Blue Top Tube Done    Extra Green Top Tube   Result Value Ref Range    Extra Green Top Tube Done    Extra Lavender Top Tube   Result Value Ref Range    Extra Lavender Top Tube Done    CBC with Differential   Result Value Ref Range    White Blood Cells 3.79 3.40 - 10.80 K/uL    Red Blood Cells 4.03 3.77 - 5.28 M/uL    Hemoglobin 12.0 11.1 - 15.9 g/dL    Hematocrit 09.6 04.5 - 46.6 %    MCV 89.8 79.0 - 97.0 fL    MCH 29.8 26.6 - 33.0 pg    MCHC 33.1 31.5 - 35.7 g/dL    RDW-CV 40.9 81.1 - 91.4 %    Platelet Count 192 150 -  450 K/uL    MPV 9.4 fL    % Neutrophils 61.4 %    % Lymphocytes 28.5 %    % Monocytes 6.1 %    % Eosinophils 3.2 %    % Basophils 0.8 %    % Immature Granulocytes 0.0 %    Absolute  Neutrophils 2.33 1.40 - 7.00 K/uL    Absolute Lymphocytes 1.08 0.70 - 3.10 K/uL    Absolute Monocytes 0.23 0.10 - 0.90 K/uL    Absolute Eosinophils 0.12 0.00 - 0.40 K/uL    Absolute Basophils 0.03 0.00 - 0.20 K/uL    Absolute Immature Granulocytes 0.00 0.00 - 0.10 K/uL    % nRBC 0.0 0.0 per 100 WBCs          Imaging Studies:     VAS Upper Extremity Venous Left    Result Date: 06/10/2023  Central Veins: Patent and free of thrombosis Left Upper Extremity: No evidence of deep or superficial venous thrombosis THIS IS A PRELIMINARY IMPRESSION If you have questions, please call [facility] at [phone] or [phone]        ED COURSE and MEDICAL DECISION MAKING     Ambur Jailee Galindez is a 52 y.o. female with history of breast cancer status post left mastectomy who presents to the Emergency Department with 2 days left lateral forearm erythema and induration.     Laboratory analysis is largely unremarkable. She has no leukocytosis, and without significant anemia, electrolyte abnormalities or renal insufficiency.    Ultrasound shows no evidence of DVT.    Considerations included but not limited to cellulitis, lipodermatosclerosis, contact dermatitis, dermatohypersensitivity reaction, lymphedema, cryptococcal cellulitis, or calciphylaxis.  No signs or symptoms to suggest monkeypox, Covid19, SLE, vasculitis, erythema multiforme, Stevens-Johnson syndrome, or toxic epidermal necrolysis.  No evidence of [facility] spotted fever, erythema migrans, or meningococcemia.  No evidence of limb ischemia at this time. No signs of phlegmasia cerulea or alba dolens. No signs of necrotizing soft tissue infection. After risks, benefits, and alternatives discussed, oral informed consent obtained for initiation of antibiotics for possible cellulitis.  I recommended to begin taking probiotics or yogurt with "live cultures" to prevent antibiotic associated diarrhea.      Medications    cephalexin (KEFLEX) capsule 1,000 mg (1,000 mg Oral Given 06/10/23 2346)     I discussed symptomatic management, signs and symptoms to monitor, and reasons to return to the emergency department. For additional discharge information - including return instructions  - please see printed documentation.    PROVIDER ATTESTATION     It is my clinical judgement that no additional emergent testing or intervention is required at this time. Please see top of note for assessment, diagnosis and disposition.    This patient was evaluated in the Emergency Department in the context of the COVID-19 global pandemic. Institutional protocols, polices and algorithms based on best practice information - to date- that pertains to the evaluation of patients at risk for COVID-19, and are released by regulatory agencies, including the CDC,[place] DOH and other federal and state health organizations, were followed during the patient's evaluation and treatment in the emergency department.    Blanchard Mane, MD  06/11/2023  2:17 AM PDT    This document was generated in part using voice recognition software, occasional wrong-word or "sound-alike" substitutions may have occurred due to the inherent limitations of voice recognition software. Read the chart carefully and recognize, using context, where these substitutions have occurred. In  addition entries are made by physician typing and scribe typing.  Although every effort is made to edit the content, transcription and typing errors may occur.         Blanchard Mane, MD  06/11/23 1616

## 2023-06-13 ENCOUNTER — Telehealth (HOSPITAL_BASED_OUTPATIENT_CLINIC_OR_DEPARTMENT_OTHER): Payer: Self-pay | Admitting: Unknown Physician Specialty

## 2023-06-13 NOTE — Telephone Encounter (Signed)
Oncology Telephone Triage Note      Subjective / Visit Information:    Reason for Call: Triage Call: patient calling about swelling in L arm and armpit.    Patient reports Swelling on L arm below elbow that started on 8\17. Hard lumps - 3 dime sized, 1 quarter sized were palpable. Reports tightness wrist to elbow, Very warm to touch, red and extremely itchy. Went to ER on 8/18, ruled out DVT and diagnosed cellulitis and given abx. Some improvement after abx. Hydrocortisone helped somewhat.      Swelling Improved in lower arm but moved to axillary area on 8/20. Hard lumps remain. Wore compression stocking on Sunday.     Traveled to [place] and [place] 7/19-8/20.      Insurance doesn't cover lympathic massage. Would like to have US done at [facility] if possible    Assessment and Plan:     Call Summary: This RN will report symptoms to provider and notify patient on next steps through [facility] per patient preference    Action Taken: Provider Notified    Response: Aware of clinic contact information and States understanding of plan      Additional documentation may exist in Flowsheets or Education Activity

## 2023-06-14 ENCOUNTER — Encounter (HOSPITAL_BASED_OUTPATIENT_CLINIC_OR_DEPARTMENT_OTHER): Payer: Self-pay

## 2023-06-15 ENCOUNTER — Ambulatory Visit
Admission: RE | Admit: 2023-06-15 | Discharge: 2023-06-15 | Disposition: A | Discharge status: Home / Self Care | Payer: 59 | Attending: Diagnostic Radiology | Admitting: Diagnostic Radiology

## 2023-06-15 DIAGNOSIS — Z170 Estrogen receptor positive status [ER+]: Secondary | ICD-10-CM | POA: Insufficient documentation

## 2023-06-15 DIAGNOSIS — C50412 Malignant neoplasm of upper-outer quadrant of left female breast: Secondary | ICD-10-CM | POA: Insufficient documentation

## 2023-07-15 LAB — PATHOLOGY, SURGICAL

## 2023-08-10 ENCOUNTER — Encounter (HOSPITAL_BASED_OUTPATIENT_CLINIC_OR_DEPARTMENT_OTHER): Payer: Self-pay | Admitting: Medical Oncology

## 2023-08-18 ENCOUNTER — Telehealth (HOSPITAL_BASED_OUTPATIENT_CLINIC_OR_DEPARTMENT_OTHER): Payer: Self-pay

## 2023-08-18 NOTE — Telephone Encounter (Signed)
 VM received from pt.    Reporting a new lump in right outer breast, noticed a few days ago.     She has been trying to get in contact with her [facility] PCP but no luck. Has a mammogram scheduled end of January. Concerns about this new lump and would like to know next steps.     Last visit with Briana on 02/14/23:    IDENTIFICATION  Yolanda Bowers is a 52 year old female with history of cT3N3 left breast invasive ductal carcinoma, ER/PR/HER2+, s/p neoadjuvant chemotherapy with ddAC-THP, left mastectomy and SLN biopsy, and adjuvant radiation therapy. She had a complete pathologic response at the time of surgery. Presents to clinic today for labs and follow up while on letrozole.     Plan per note to FU in 6 months. Pt sent in message last week asking for 74m FU and her next appt is scheduled 09/28/23.

## 2023-08-25 ENCOUNTER — Encounter (HOSPITAL_BASED_OUTPATIENT_CLINIC_OR_DEPARTMENT_OTHER): Payer: Self-pay

## 2023-08-25 ENCOUNTER — Ambulatory Visit: Payer: 59

## 2023-08-25 VITALS — BP 109/75 | HR 62 | Temp 98.1°F | Resp 16 | Wt 173.3 lb

## 2023-08-25 DIAGNOSIS — C50412 Malignant neoplasm of upper-outer quadrant of left female breast: Secondary | ICD-10-CM | POA: Insufficient documentation

## 2023-08-25 DIAGNOSIS — Z170 Estrogen receptor positive status [ER+]: Secondary | ICD-10-CM | POA: Insufficient documentation

## 2023-08-25 NOTE — Progress Notes (Signed)
 [facility]  BREAST ONCOLOGY CLINIC NOTE    IDENTIFICATION  Nico Shrinika Popick is a 53 year old female with history of cT3N3 left breast invasive ductal carcinoma, ER/PR/HER2+, s/p neoadjuvant chemotherapy with ddAC-THP, left mastectomy and SLN biopsy, and adjuvant radiation therapy. She had a complete pathologic response at the time of surgery. Presents to clinic today for new breast lump.    ONCOLOGIC HISTORY     Oncology History Overview Note   10/2018: [name]   11/24/2018: Bilateral mammogram revealed a negative right breast. To the left breast UOQ, there was an 8mm mass and a 2.4 cm mass laterally, 17 cm from the nipple. Targeted left breast ultrasound revealed at 3 o'clock 8 cm from the nipple, a 21x13x24mm mass and an enlarged axillary lymph node measuring 12x11x5mm. There were multiple enlarged lymph nodes  11/27/2018: US-guided biopsy of the breast mass revealed invasive ductal carcinoma, ER/PR+, HER2+, grade 3. There was angiolymphatic invasion seen on biopsy. Biopsy of axillary lymph node positive for carcinoma.   12/08/2018: Bilateral breast MRI revealed right breast at 3 o'clock, 2.7cm from the nipple, a 10 x 6 x76mm mass and at 12 o'clock, 11cm from the nipple, a spiculated 16 x 4 x 13mm mass. Ultrasound recommended.  To the left breast, mass consistent with biopsy proved carcinoma measuring 24 x 20 x 24mm. There was also diffuse NME involving mid-outer breast extending over 110x45x41mm. Within the NME, there was a small spiculated mass at 12 o'clock measuring 12mm. Multiple level 1,2 and 3 lymph nodes seen, in addition to at least 3 internal mammary nodes.   12/14/2018: PET CT without evidence of distant metastasis. There are 4mm lung nodules bilaterally.   12/29/2018: Started on neoadjuvant chemotherapy with ddAC x4 cycles.  03/09/2019: Started on neoadjuvant chemotherapy with Taxol, Herceptin, and Perjeta.  06/25/2019: Underwent  left mastectomy and SLN biopsy with Dr. Molli Hazard. Pathology revealed a single focus of angiolymphatic involvement by carcinoma (< 0.1cm). Negative surgical margins. 0 of 5 lymph nodes positive for carcinoma. ypT0 ypN0.   09/21/2019: Completed XRT.  Started on letrozole 2.5mg  PO daily.     01/29/2020: Given drop in LVEF, last Herceptin/Perjeta infusion was discontinued. Letrozole continued.    Negative genetic panel testing, 11/2018.     Malignant neoplasm of upper-outer quadrant of left breast in female, estrogen receptor positive (HCC)   11/07/2019 - 01/29/2020 Systemic Therapy    pertuzumab (Perjeta) 840 mg in sodium chloride 0.9 % 250 mL IVPB, Intravenous, 4 of 6 cycles  trastuzumab (Herceptin) 593 mg in sodium chloride 0.9 % 250 mL IVPB, Intravenous, 4 of 6 cycles     11/18/2019 Initial Diagnosis    Malignant neoplasm of upper-outer quadrant of left breast in female, estrogen receptor positive (HCC)          INTERVAL HISTORY  Keya Caelan Yarger was last seen by Luanna Cole Richine, PA-C. She noticed shortly before christmas a new mass. Denies any nipple discharge, nipple inversion or pain. Has not noticed any further changes in the breast mass.     ECOG Status: (0) Fully active, able to carry on all predisease performance without restriction     REVIEW OF SYSTEMS  A complete review of systems was conducted and is negative in detail, except as noted in the interval history.    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 Charlene Brooke    PR MASTECTOMY SIMPLE COMPLETE Left 06/25/2019    Had radiation        ALLERGIES  Review of patient's allergies indicates:  Allergies   Allergen Reactions    Oxycodone Skin: Itching    Codeine AV:[id number]/[id number]    Lisinopril Resp: Cough    Paclitaxel Skin: Hives        MEDICATIONS    Current Outpatient Medications:     acetaminophen 500 MG tablet, Take 1-2 tabs by mouth once daily as needed for pain., Disp: , Rfl:     aspirin 81 MG EC tablet, Take 1 tablet (81 mg) by mouth., Disp: , Rfl:     cetirizine 10 MG tablet, Take by mouth., Disp: , Rfl:     cholecalciferol 50 mcg (2,000 unit) capsule, Take 1 capsule (2,000 units) by mouth daily., Disp: , Rfl:     cyanocobalamin (Vitamin B-12) 500 MCG tablet, Take 1 tablet (500 mcg) by mouth daily., Disp: , Rfl:     famotidine 20 MG tablet, Take 1 tablet (20 mg) by mouth as needed., Disp: , Rfl:     furosemide 20 MG tablet, Take 1 tablet (20 mg) by mouth daily. (Patient not taking: Reported on 02/11/2023), Disp: 90 tablet, Rfl: 3    letrozole 2.5 MG tablet, Take 1 tablet (2.5 mg) by mouth daily., Disp: 90 tablet, Rfl: 3    methocarbamol 500 MG tablet, Take 1 tablet (500 mg) by mouth at bedtime as needed for muscle spasms., Disp: 30 tablet, Rfl: 2    metoprolol succinate ER 25 MG 24 hr tablet, Take 1 tablet (25 mg) by mouth 2 times a day. Do not chew or crush., Disp: 180 tablet, Rfl: 3    potassium chloride ER 20 MEQ ER tablet, Take 2 tablets (40 mEq) by mouth daily. (Patient not taking: Reported on 04/20/2023), Disp: 180 tablet, Rfl: 2    valsartan 40 MG tablet, Take 1 tablet (40 mg) by mouth 2 times daily, Disp: 180 tablet, Rfl: 1    venlafaxine ER 75 MG 24 hr capsule, Take 1 capsule (75 mg) by mouth., Disp: , Rfl:      SOCIAL HISTORY:   Social History     Tobacco Use    Smoking status: Never    Smokeless tobacco: Never   Substance Use Topics    Alcohol use: Not Currently      Comment: occasional wine , 2 glasses a month    Drug use: Not Currently      Two children, one in college     FAMILY HISTORY:    Cancer-related family history includes Ovarian Cancer (age of onset: 18) in her paternal aunt; Skin Cancer in her father and maternal grandfather.       PHYSICAL EXAM  VITAL SIGNS: BP 109/75   Pulse 62   Temp 36.7 C (Oral)  Resp 16   Wt 78.6 kg (173 lb 4.8 oz)   SpO2 97%   BMI 27.97 kg/m       GENERAL: Well appearing, well developed female in no acute distress.  BREASTS: s/p left mastectomy without reconstruction. On her Right breast there is a 2 cm mass at 11 o'clock, mobile. No axillary lymphadenopathy appreciated bilaterally.   NUTRITIONAL STATUS: Adequate.    LABORATORY  Results for orders placed or performed in visit on 03/10/23   B-Type Natriuretic Peptide   Result Value Ref Range    B_Type Natriuretic Peptide 25 <101 pg/mL   CBC with Diff   Result Value Ref Range    WBC 3.36 (L) 4.3 - 10.0 10*3/uL    RBC 4.32 3.80 - 5.00 10*6/uL    Hemoglobin 12.6 11.5 - 15.5 g/dL    Hematocrit 39 57.8 - 45.0 %    MCV 90 81 - 98 fL    MCH 29.2 27.3 - 33.6 pg    MCHC 32.5 32.2 - 36.5 g/dL    Platelet Count 469 629 - 400 10*3/uL    RDW-CV 12.1 11.0 - 14.5 %    % Neutrophils 66 %    % Lymphocytes 24 %    % Monocytes 7 %    % Eosinophils 2 %    % Basophils 1 %    % Immature Granulocytes 0 %    Neutrophils 2.25 1.80 - 7.00 10*3/uL    Absolute Lymphocyte Count 0.79 (L) 1.00 - 4.80 10*3/uL    Monocytes 0.22 0.00 - 0.80 10*3/uL    Absolute Eosinophil Count 0.07 0.00 - 0.50 10*3/uL    Basophils 0.02 0.00 - 0.20 10*3/uL    Immature Granulocytes 0.01 0.00 - 0.05 10*3/uL    Nucleated RBC 0.00 0.00 10*3/uL    % Nucleated RBC 0 %   Comprehensive Metabolic Panel   Result Value Ref Range    Sodium 139 135 - 145 meq/L    Potassium 4.2 3.6 - 5.2 meq/L    Chloride 104 98 - 108 meq/L    Carbon Dioxide, Total 29 22 - 32 meq/L    Anion Gap 6 4 - 12    Glucose 84 62 - 125 mg/dL    Urea Nitrogen 16 8 - 21 mg/dL     Creatinine 5.28 4.13 - 1.02 mg/dL    Protein (Total) 7.0 6.0 - 8.2 g/dL    Albumin 4.5 3.5 - 5.2 g/dL    Bilirubin (Total) 0.3 0.2 - 1.3 mg/dL    Calcium 9.6 8.9 - 24.4 mg/dL    AST (GOT) 18 9 - 38 U/L    Alkaline Phosphatase (Total) 39 34 - 121 U/L    ALT (GPT) 15 7 - 33 U/L    eGFR by CKD-EPI 2021 >60 >59 mL/min/1.73_m2   Lipid Panel   Result Value Ref Range    Total Cholesterol 177 <200 mg/dL    Triglyceride 69 <010 mg/dL    HDL Cholesterol 71 >27 mg/dL    Non-HDL Cholesterol 106 0 - 159 mg/dL    LDL Cholesterol, NIH Equation 93 <130 mg/dL    Cholesterol/HDL Ratio 2.5     Lipid Panel, Additional Info. (NOTE)    Magnesium   Result Value Ref Range    Magnesium 2.0 1.8 - 2.4 mg/dL         ASSESSMENT/PLAN  Nayelie Jaeci Cassetty is a 53 year old female with history of cT3N3 left breast invasive ductal carcinoma, ER/PR/HER2+, s/p neoadjuvant chemotherapy  with ddAC-THP, left mastectomy and SLN biopsy, and adjuvant radiation therapy. She had a complete pathologic response at the time of surgery. Presents to clinic today for new breast mass.     1. Malignant neoplasm of upper-outer quadrant of left breast in female, estrogen receptor positive (HCC)  Treina presents with a new breast mass. We will plan to get diagnostic imaging with ultrasound and follow up as needed. She has [employer] so will verify where imaging needs to take place.   DEXA scan due 12/2023.  She will keep her follow up with Dr. Wadie Lessen for ongoing letrozole.     Survivorship:  - Counseled on regular exercise and dietary measures to reduce risk of recurrence.    Orders Placed This Encounter    Mammography Diagnostic Right    US Breast Limited Right    Mammography Diagnostic Right    US Breast Limited Right      DISPOSITION  Patient to return to clinic in 2 months for follow up and surveillance, or sooner if needed. Patient encouraged to contact clinic in the interim with any questions or concerns.    TIME STATEMENT: I spent 26 minutes in total for  patient's care today, including preparation, time with patient, and charting.

## 2023-08-31 ENCOUNTER — Other Ambulatory Visit (HOSPITAL_BASED_OUTPATIENT_CLINIC_OR_DEPARTMENT_OTHER): Payer: Self-pay | Admitting: Cardiovascular Disease

## 2023-08-31 DIAGNOSIS — I429 Cardiomyopathy, unspecified: Secondary | ICD-10-CM

## 2023-09-02 ENCOUNTER — Encounter (HOSPITAL_BASED_OUTPATIENT_CLINIC_OR_DEPARTMENT_OTHER): Payer: Self-pay | Admitting: Nursing

## 2023-09-02 MED ORDER — VALSARTAN 40 MG OR TABS
ORAL_TABLET | ORAL | 1 refills | Status: DC
Start: 2023-09-02 — End: 2024-02-17

## 2023-09-05 ENCOUNTER — Ambulatory Visit: Payer: 59

## 2023-09-05 ENCOUNTER — Ambulatory Visit (HOSPITAL_BASED_OUTPATIENT_CLINIC_OR_DEPARTMENT_OTHER): Payer: 59

## 2023-09-05 NOTE — Telephone Encounter (Signed)
 No changes to valsartan dose on 7/18 encounter. Per previous note:      No changes needed.

## 2023-09-21 ENCOUNTER — Ambulatory Visit (HOSPITAL_BASED_OUTPATIENT_CLINIC_OR_DEPARTMENT_OTHER): Payer: 59
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
## 2023-09-28 ENCOUNTER — Encounter (HOSPITAL_BASED_OUTPATIENT_CLINIC_OR_DEPARTMENT_OTHER): Payer: Self-pay | Admitting: Medical Oncology

## 2023-09-28 ENCOUNTER — Ambulatory Visit: Payer: 59 | Attending: Medical Oncology | Admitting: Medical Oncology

## 2023-09-28 DIAGNOSIS — Z170 Estrogen receptor positive status [ER+]: Secondary | ICD-10-CM | POA: Insufficient documentation

## 2023-09-28 DIAGNOSIS — C50412 Malignant neoplasm of upper-outer quadrant of left female breast: Secondary | ICD-10-CM | POA: Insufficient documentation

## 2023-09-28 DIAGNOSIS — Z9012 Acquired absence of left breast and nipple: Secondary | ICD-10-CM | POA: Insufficient documentation

## 2023-09-28 DIAGNOSIS — Z1731 Human epidermal growth factor receptor 2 positive status: Secondary | ICD-10-CM | POA: Insufficient documentation

## 2023-09-28 DIAGNOSIS — Z1721 Progesterone receptor positive status: Secondary | ICD-10-CM | POA: Insufficient documentation

## 2023-09-28 DIAGNOSIS — Z9221 Personal history of antineoplastic chemotherapy: Secondary | ICD-10-CM | POA: Insufficient documentation

## 2023-09-28 DIAGNOSIS — I509 Heart failure, unspecified: Secondary | ICD-10-CM | POA: Insufficient documentation

## 2023-09-29 ENCOUNTER — Encounter (HOSPITAL_BASED_OUTPATIENT_CLINIC_OR_DEPARTMENT_OTHER): Payer: Self-pay

## 2023-10-05 ENCOUNTER — Other Ambulatory Visit (HOSPITAL_BASED_OUTPATIENT_CLINIC_OR_DEPARTMENT_OTHER): Payer: Self-pay | Admitting: Physician Assistant

## 2023-10-05 DIAGNOSIS — C50412 Malignant neoplasm of upper-outer quadrant of left female breast: Secondary | ICD-10-CM

## 2023-10-05 MED ORDER — LETROZOLE 2.5 MG OR TABS: 2.5000 mg | ORAL_TABLET | Freq: Every day | ORAL | 3 refills | Status: DC

## 2023-10-12 ENCOUNTER — Telehealth (HOSPITAL_BASED_OUTPATIENT_CLINIC_OR_DEPARTMENT_OTHER): Payer: 59

## 2023-10-13 ENCOUNTER — Telehealth (HOSPITAL_BASED_OUTPATIENT_CLINIC_OR_DEPARTMENT_OTHER): Payer: 59

## 2023-10-13 DIAGNOSIS — C50412 Malignant neoplasm of upper-outer quadrant of left female breast: Secondary | ICD-10-CM
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
## 2023-10-19 NOTE — Patient Instructions (Signed)
 Echocardiogram scheduled next follow-up visit    Continue with current meds    Minimize stressors    Try to exercise with weight training as shown in clinic ([employer]:  "a full body dumbbell workout you can do anywhere"--video)    If you have any questions, please call my RN, Percell Belt at [phone].  You can also send [place] a eCare message for non-urgent matters  Please allow at least 48 hours for a response.   If you don't hear back, please call.    Follow-up in 6 months.
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
## 2023-10-20 ENCOUNTER — Ambulatory Visit: Payer: 59 | Attending: Cardiovascular Disease | Admitting: Cardiovascular Disease

## 2023-10-20 VITALS — BP 115/74 | HR 65 | Temp 98.2°F | Ht 66.0 in | Wt 174.0 lb

## 2023-10-20 DIAGNOSIS — F419 Anxiety disorder, unspecified: Secondary | ICD-10-CM | POA: Insufficient documentation

## 2023-10-20 DIAGNOSIS — I427 Cardiomyopathy due to drug and external agent: Secondary | ICD-10-CM | POA: Insufficient documentation

## 2023-10-20 DIAGNOSIS — T451X5A Adverse effect of antineoplastic and immunosuppressive drugs, initial encounter: Secondary | ICD-10-CM | POA: Insufficient documentation

## 2023-10-20 DIAGNOSIS — I493 Ventricular premature depolarization: Secondary | ICD-10-CM | POA: Insufficient documentation

## 2023-10-20 LAB — COMPREHENSIVE METABOLIC PANEL
ALT (GPT): 18 U/L (ref 7–33)
AST (GOT): 18 U/L (ref 9–38)
Albumin: 4.4 g/dL (ref 3.5–5.2)
Alkaline Phosphatase (Total): 47 U/L (ref 34–121)
Anion Gap: 4 (ref 4–12)
Bilirubin (Total): 0.4 mg/dL (ref 0.2–1.3)
Calcium: 9.3 mg/dL (ref 8.9–10.2)
Carbon Dioxide, Total: 31 meq/L (ref 22–32)
Chloride: 104 meq/L (ref 98–108)
Creatinine: 0.74 mg/dL (ref 0.38–1.02)
Glucose: 77 mg/dL (ref 62–125)
Potassium: 4.1 meq/L (ref 3.6–5.2)
Protein (Total): 6.6 g/dL (ref 6.0–8.2)
Sodium: 139 meq/L (ref 135–145)
Urea Nitrogen: 14 mg/dL (ref 8–21)
eGFR by CKD-EPI 2021: >60 mL/min/{1.73_m2} (ref >59)

## 2023-10-20 LAB — CBC, DIFF
% Basophils: 1 %
% Eosinophils: 2 %
% Immature Granulocytes: 0 %
% Lymphocytes: 28 %
% Monocytes: 7 %
% Neutrophils: 62 %
% Nucleated RBC: 0 %
Absolute Eosinophil Count: 0.07 10*3/uL (ref 0.00–0.50)
Absolute Lymphocyte Count: 0.93 10*3/uL — ABNORMAL LOW (ref 1.00–4.80)
Basophils: 0.02 10*3/uL (ref 0.00–0.20)
Hematocrit: 35 % — ABNORMAL LOW (ref 36.0–45.0)
Hemoglobin: 11.9 g/dL (ref 11.5–15.5)
Immature Granulocytes: 0 10*3/uL (ref 0.00–0.05)
MCH: 29.7 pg (ref 27.3–33.6)
MCHC: 33.8 g/dL (ref 32.2–36.5)
MCV: 88 fL (ref 81–98)
Monocytes: 0.25 10*3/uL (ref 0.00–0.80)
Neutrophils: 2.09 10*3/uL (ref 1.80–7.00)
Nucleated RBC: 0 10*3/uL
Platelet Count: 170 10*3/uL (ref 150–400)
RBC: 4.01 10*6/uL (ref 3.80–5.00)
RDW-CV: 11.9 % (ref 11.0–14.5)
WBC: 3.36 10*3/uL — ABNORMAL LOW (ref 4.3–10.0)

## 2023-10-20 LAB — IRON BINDING CAPACITY (W/IRON, TRANSFERRIN & TRANSF SAT)
Iron, SRM: 78 ug/dL (ref 31–171)
Total Iron Binding Capacity: 361 ug/dL (ref 270–535)
Transferrin Saturation: 22 % (ref 10–45)
Transferrin: 258 mg/dL (ref 192–382)

## 2023-10-20 LAB — LAB ADD ON ORDER

## 2023-10-20 LAB — B_TYPE NATRIURETIC PEPTIDE: B_Type Natriuretic Peptide: 41 pg/mL (ref <101)

## 2023-10-20 LAB — MAGNESIUM: Magnesium: 1.9 mg/dL (ref 1.8–2.4)

## 2023-10-28 ENCOUNTER — Encounter (HOSPITAL_BASED_OUTPATIENT_CLINIC_OR_DEPARTMENT_OTHER): Payer: Self-pay

## 2023-10-30 ENCOUNTER — Encounter (HOSPITAL_BASED_OUTPATIENT_CLINIC_OR_DEPARTMENT_OTHER): Payer: Self-pay | Admitting: Cardiovascular Disease

## 2023-10-30 DIAGNOSIS — I427 Cardiomyopathy due to drug and external agent: Secondary | ICD-10-CM

## 2023-10-30 DIAGNOSIS — R5383 Other fatigue: Secondary | ICD-10-CM

## 2023-10-30 DIAGNOSIS — R0600 Dyspnea, unspecified: Secondary | ICD-10-CM

## 2023-11-01 ENCOUNTER — Telehealth (HOSPITAL_BASED_OUTPATIENT_CLINIC_OR_DEPARTMENT_OTHER): Payer: 59

## 2023-11-01 DIAGNOSIS — C50412 Malignant neoplasm of upper-outer quadrant of left female breast: Secondary | ICD-10-CM

## 2023-11-01 NOTE — Progress Notes (Signed)
 [facility] SOCIAL [facility] CLINICAL NOTE    ID: Yolanda Bowers is a 53 year old female with history of cT3N3 left breast invasive ductal carcinoma, ER/PR/HER2+.    Reason for referral to Social Work: Supportive Counseling   Encounter method: Telehealth: I conducted this encounter via secure, live, face-to-face video conference with the patient. I reviewed the risks and benefits of telemedicine as pertinent to this visit and the patient agreed to proceed. Patient was located at home. Provider was located at off-site location.  Encounter type: Return Social Work Visit  Present at the visit: Patient    ASSESSMENT:    Screening scores    PHQ-9 depression screening history:  PHQ9 Total Scores         10/12/2023 10/31/2023          PHQ9 Total Score: 13  12             Thoughts that you would be better off dead or of hurting yourself in some way: (Patient-Rptd) 0- Not at all    PHQ-9 score indicates: 10 - 14: Moderate depression  PHQ-9 Flowsheet    GAD-7 anxiety screening history:  GAD7 Total Scores         10/12/2023 10/31/2023          Total: 7  5             GAD-7 score indicates: 5 - 9: Mild anxiety  GAD-7 Flowsheet    Suicide assessment   Suicide assessment not indicated today.      Mental status exam  Appearance: Good grooming and hygiene  Behavior: Appropriately interactive w/ others  Speech: Normal rate, Normal rhythm  Affect: Appropriate  Mood: Patient reported "overwhelm"  Thought Form/Content: Coherent, Concrete  Attention/Concentration: Good  Memory: Normal  Judgement/Insight: Intact    Psychosocial background   Mental health history (diagnoses, in-pt/out-pt treatment, psychotropic medications, SI and/or trauma history, family MH history): Patient reported diagnosis of Major Depressive Disorder. Patient prescribed Venlafaxine 75 mg daily. Patient reported attending counseling at [facility] every couple weeks. Patient endorsed the use of trauma informed care and cognitive behavioral therapy modalities.  Patient reported that she previously saw Dr. Jenne Pane with [facility]. Patient reported experiencing symptoms of anxiety, low energy, and fatigue. Patient shared that she is beginning to track her sleep. Patient shared that some of the anxiety she is experiencing is due to fear of cancer reoccurrence or diagnosis of other health challenges.    Substance use history (alcohol, marijuana, recreational drugs): Per chart review, patient does not endorse use of tobacco or drug use. Patient does not currently endorse alcohol use. Patient endorses occasional wine, about 2 glasses per month.    Previous steroid use and adverse reaction: Not reviewed today    Coping skills and strengths: just keep going, hide and mask, walks, exercise, being creative, travel, paint, print making, [employer] activities    Barriers to activation (sleep, pain, exercise, and fatigue concerns): Anxiety, stress     Brief social history (childhood, family situation, current home/living situation, community supports, education, work, Hotel manager active duty/veteran status, cultural factors):   Patient lives in [place] with her 73 yo son, 36 yo son, and son's girlfriend. Patient shared that she her partner, Gigi Gin, lives in [place]. The couple have been together for a couple years. Patient currently in divorce process with her ex husband. Patient reported that they have not been living together for over a year and in the mediation stage. Patient  shared there are safety precautions in place to protect both herself and kids, including restraining order. Patient has a sister, brother, and sister-in law living in [place]. Patient works full-time as a Runner, broadcasting/film/video in the [employer] 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 Patient reported that she is aware of PFML and inquired about possibility last week Patient aware of pay cut if she does continue with PFML. 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 Patient finds joy in creativity, travelling, and spending time with her close loved ones.     Religious/spiritual background and affiliation: Not reviewed today    Treatment-related logistics/concerns  Caregiver plan for treatment: Not reviewed today    Lodging/housing plan for treatment: Lives in [place]    Travel/lodging benefit for treatment: Not reviewed today    Practical/logistical/financial concerns: To discuss further at next visit    Potential barriers to accessing care: Not reviewed today    Treatment preferences (communication, goals, care preferences): Communication, goals    Advance care planning: Not reviewed today    Release of Information (ROI): Not reviewed today  --  ENCOUNTER SUMMARY:  Yolanda Bowers is a 53 year old female with history of cT3N3 left breast invasive ductal carcinoma, ER/PR/HER2+.  Patient's psychometric responses indicate potential 10 - 14: Moderate depression and 5 - 9: Mild anxiety. Patient denies SI and HI, and reports feeling safe.    3/11: Patient shared on feelings of exhaustion, as she is continuing to cope with mental and physical stressors. Patient reported on [facility] and potential options for work schedule. Patient shared that she is focusing on taking care of herself, in ways like "I am going to reclaim my life and regain my time/interests." Patient shared that she is taking a few classes a week through [employer] and a swim classes for physical activity.     2/20: MSW conducted initial assessment with patient. MSW shared that she has experienced fatigue, hot flashes, and nausea since chemo treatment. Patient currently on Letrozole since 2021. Patient [facility] to [facility] with new breast lump in December of 2024. Patient working with med team to assess. Patient shared that there are external factors that have been causing increased anxiety and depression symptoms, as she is going through divorce proceedings. Patient expressed that she is  focused on what is best for her and her sons. MSW provided space for patient to name and externally process emotions. Patient sees counselor in the community every other week. Patient expressed how she is engaging in activities that bring joy and ease during this time, including creative projects and quality time with her sons. Patient to meet with [facility] in a week.    Referrals:  [facility]  --  INTERVENTION:  MSW introduced self and role as resource-linker, advocate, and supportive counselor, Provided contact information, and Provided counseling support    Themes explored:  Adjustment to diagnosis/treatment, Logistical/practical stressors, Mood concerns/symptoms, and Relationships    Therapeutic interventions/approaches used:  Relaxation techniques, Self-compassion, and Supportive counseling    --  PLAN:   MSW appreciated the opportunity to connect with this patient. Patient is aware of Social Work role and services. Return visit planned and order placed for: 3/27    Time Spent - Direct Care: 60 minutes  Time Spent - Indirect Care: 20 minutes    Donell Beers  [phone]

## 2023-11-01 NOTE — Telephone Encounter (Signed)
 53 year old female with history of high-grade infiltrating ductal carcinoma of the left breast (ER+/PR+, FISH +) with angiolymphatic invasion, history of neoadjuvant chemotherapy with ddAC-THP, left mastectomy with lymph node biopsy, received 4 or 6 cycles of herceptin and Perjeta due to drop in EF on letrozole, VT, history of PVC ablation on 03/21/2020.      Last visit: 10/20/23 with Dr. Konrad Dolores  Next visit: 11/20/23 with Dr. Konrad Dolores     Today's concern: increasing SOB with normal activities, HA's, Nausea and feeling worse.     Plan:  Requested BP/Pulse and weight log  Requested further details of symptoms  Encouraged urgent care/PCP/ED for worsening symptoms  Routing to Dr. Konrad Dolores for review     Silva Bandy RN BSN  [facility]  1500 at 11/01/23  [facility]

## 2023-11-09 ENCOUNTER — Encounter (HOSPITAL_BASED_OUTPATIENT_CLINIC_OR_DEPARTMENT_OTHER): Payer: Self-pay

## 2023-11-14 ENCOUNTER — Ambulatory Visit: Payer: 59

## 2023-11-14 DIAGNOSIS — Z170 Estrogen receptor positive status [ER+]: Secondary | ICD-10-CM

## 2023-11-14 DIAGNOSIS — C50412 Malignant neoplasm of upper-outer quadrant of left female breast: Secondary | ICD-10-CM

## 2023-11-15 NOTE — Progress Notes (Signed)
 To patients reading this note: Please be advised that the primary purpose of this note is for me to communicate other members of your medical team. Standard sentence structure is not always used. Medical terminology, abbreviations, and acronyms may be used that are unfamiliar to non-health care affiliated individuals. Parts of this note may have been generated using voice dictation software. There may be wrong-word or sound-alike substitution errors and typographical errors missed in proofreading.     [facility] Cancer Survivor Telehealth Note.      Distant Site Telemedicine Encounter  I conducted this encounter via secure, live, face-to-face video conference with the patient. I reviewed the risks and benefits of telemedicine as pertinent to this visit and the patient agreed to proceed.    Provider Location: On-site location (clinic, hospital, on-site office)  Patient Location: At home    Present with patient: No one else present        VISIT INFORMATION   Type of Visit: Telemedicine Initial  Referral Reason: additional support   Referral Source: Donell Beers      Chief Complaint  Yolanda Bowers presents to establish with/ follow up with Survivorship    Interval History  Yolanda Bowers is a 53 year old female with history of high-grade infiltrating ductal carcinoma of the left breast (ER+/PR+, FISH +) with angiolymphatic invasion, history of neoadjuvant chemotherapy with ddAC-THP, left mastectomy with lymph node biopsy, received 4 or 6 cycles of herceptin and Perjeta due to drop in EF on letrozole, VT, history of PVC ablation on 03/21/2020, under the care of Dr Wadie Lessen and team.     It's a pleasure to meet Yolanda Bowers today for her first appointment concerning her cancer Survivorship.     She states that she's been through a lot with her chemo and radiation and immunotherapy, and that at this point, she's just exhausted.  She shares with me that she has a great relationship with her oncology team and she's been told  that she needs to give herself more grace and take the time to heal as she's been through a lot.      She shares with me that she's sleeping 10 to 12 hours a day and still feeling wiped out but was able to this past weekend blow the driveway and also mow part of her yard. It felt good to feel a sense of normalcy she shares.    She knows that she's making strides, but it's difficult as she still dealing with some issues  concerning her breathing and also her heart.  She recently saw her cardiologist and they've made no changes to her therapy as her EF was 60 and so they're going to continue watching her closely and she also will be getting PFTs done later on this month.      She's going to reach out to her PCP as she had recent labs and it looks like her CBC has normalized and also in the past she's had thyroids studies that were normal, but they may consider rechecking her thyroid again. She wants to be assured that nothing is going on with her thyroid that's adding to her increased fatigue.    She also shares with me the one thing that's bothering he the most is her cognitive ability as she states that she's always been very sharp.  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  She shared she doesn't have much energy by the end of the day so she really takes it easy as much as she can.    She says that  she's eating well and that she really sticks with clean foods avoiding processed foods as much as she can.    She's looking forward to taking a two week period of time off in the middle of April so that she can just relax and see if she can gain some of her energy back    She's been closely working with our social work team, as well as a Veterinary surgeon, and she states that that is really helped her with gaining perspective concerning some personal things going on her life as well as her cancer journey.    She says she has a lot of goals in the future. She would like to spend more time working on her business called [employer] as this is her passion and travel again.    We discussed quite a bit about where she's at concerning her anxiety and stress.  I'm so happy that she's working with the counselor is she feels that they are making steady progress there.     She still struggles with what her life is going to look like moving forward, but is realizing that she is needing  take it one day at a time.  We really emphasize that it's all about perspective and all about just giving herself grace right now.  She has support with her kids as that bring comfort.     She realizes that with her family history she is due for a colonoscopy so she will reach out to her primary care provider to get that testing done. She has noot sen derm as she does have a mole that she is wondering about where she had radiation so will ask for an eval or referral. She did have a recent Pap smear that was normal so she thinks that she'll be getting another one in three years, she states that she continues to be follow closely by her primary and oncology team concerning her labs.      Past Oncologic History  Oncology History Overview Note   10/2018: [name]   11/24/2018: Bilateral mammogram revealed a negative right breast. To the left breast UOQ, there was an 8mm mass and a 2.4 cm  mass laterally, 17 cm from the nipple. Targeted left breast ultrasound revealed at 3 o'clock 8 cm from the nipple, a 21x13x7mm mass and an enlarged axillary lymph node measuring 12x11x75mm. There were multiple enlarged lymph nodes  11/27/2018: US-guided biopsy of the breast mass revealed invasive ductal carcinoma, ER/PR+, HER2+, grade 3. There was angiolymphatic invasion seen on biopsy. Biopsy of axillary lymph node positive for carcinoma.   12/08/2018: Bilateral breast MRI revealed right breast at 3 o'clock, 2.7cm from the nipple,  a 10 x 6 x63mm mass and at 12 o'clock, 11cm from the nipple, a spiculated 16 x 4 x 13mm mass. Ultrasound recommended.  To the left breast, mass consistent with biopsy proved carcinoma measuring 24 x 20 x 24mm. There was also diffuse NME involving mid-outer breast extending over 110x45x47mm. Within the NME, there was a small spiculated mass at 12 o'clock measuring 12mm. Multiple level 1,2 and 3 lymph nodes seen, in addition to at least 3 internal mammary nodes.   12/14/2018: PET CT without evidence of distant metastasis. There are 4mm lung nodules bilaterally.   12/29/2018: Started on neoadjuvant chemotherapy with ddAC x4 cycles.  03/09/2019: Started on neoadjuvant chemotherapy with Taxol, Herceptin, and Perjeta.  06/25/2019: Underwent left mastectomy and SLN biopsy with Dr. Molli Hazard. Pathology revealed a single focus of angiolymphatic involvement by carcinoma (< 0.1cm). Negative surgical margins. 0 of 5 lymph nodes positive for carcinoma. ypT0 ypN0.   09/21/2019: Completed XRT.  Started on letrozole 2.5mg  PO daily.     01/29/2020: Given drop in LVEF, last Herceptin/Perjeta infusion was discontinued. Letrozole continued.    Negative genetic panel testing, 11/2018.     Malignant neoplasm of upper-outer quadrant of left breast in female, estrogen receptor positive (HCC)   12/04/2018 Hereditary Counseling/Testing    Genetic counseling at [facility] 29 gene panel - negative for mutations      12/20/2018 Surgery    IR placement of right chest wall port at [facility]  IR port removal at [facility] 10/23/2020     12/29/2018 - 01/12/2020 Systemic Therapy    Neo-adjuvant chemotherapy at [facility],MD  5/8 - 02/17/2019  dose dense AC x 4 cycles  Doxorubicin   total dose 240 mg/m2  Cyclophosphamide  Filgrastim    03/09/2019 - 01/12/2020  THP  Paclitaxel (Taxol) x 12 weeks, completed 06/01/2019  pertuzumab (Perjeta) 840 mg in sodium chloride 0.9 % 250 mL IVPB, Intravenous, 4 of 6 cycles  trastuzumab (Herceptin) 593 mg in sodium chloride 0.9 % 250 mL IVPB, Intravenous, 4 of 6 cycles  Herceptin and Perjeta discontinued after 01/12/2020 dose due to drop in LVEF/GLS     06/25/2019 Surgery    Left total mastectomy, left axillary sentinel lymph node biopsy - Molli Hazard, MD  [facility]  Right breast reduction - Peter Congo, MD  Pathology:  No residual tumor - complete pathologic response to neo-adjuvant chemotherapy  Single focus angiolymphatic involvement <0.1 cm.  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   Her children each has a 50% chance to be a CPT2 carrier, and if one is AND his future partner is also a carrier, then their children have a 25% risk each to inherit the disease CPT2 deficiency. Her children can  undergo carrier testing before having children since  this does not affect their own health but does have reproductive implications.          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    Congestive heart failure (HCC)     Headache     Left arm lymphedema       Past Surgical History Past Surgical History:   Procedure Laterality Date    Cardiac ablation  03/21/2020    Tonsillectomy  05/2009      Family History Cancer-related family history includes:  Paternal aunt with Ovarian cancer age 44  Paternal great aunts x 3 with colon cancer ages 67 - 45  Maternal grandfather with bone cancer age 11   Tobacco Use Social History     Tobacco Use   Smoking Status Never   Smokeless Tobacco Never      Alcohol Use Social History     Substance and Sexual Activity   Alcohol Use Not Currently    Comment: occasional wine , 2 glasses a month          Current Outpatient Medications:     acetaminophen 500 MG tablet, Take 1-2 tablets (500-1,000 mg) by mouth as needed. Take 1-2 tabs by mouth once daily as needed for pain., Disp: , Rfl:     aspirin 81 MG EC tablet, Take 1 tablet (81 mg) by mouth., Disp: , Rfl:     cetirizine 10 MG tablet, Take by mouth., Disp: , Rfl:     cholecalciferol 50 mcg (2,000 unit) capsule, Take 5,000 units by mouth daily., Disp: , Rfl:     cyanocobalamin (Vitamin B-12) 500 MCG tablet, Take 1 tablet (500 mcg) by mouth daily., Disp: , Rfl:     famotidine 20 MG tablet, Take 1 tablet (20 mg) by mouth as needed., Disp: , Rfl:     letrozole 2.5 MG tablet, Take 1 tablet (2.5 mg) by mouth daily, Disp: 90 tablet, Rfl: 3    methocarbamol 500 MG tablet, Take 1 tablet (500 mg) by mouth at bedtime as needed for muscle spasms., Disp: 30 tablet, Rfl: 2    metoprolol succinate ER 25 MG 24 hr tablet, Take 1 tablet (25 mg) by mouth 2 times a day. Do not chew or crush., Disp: 180 tablet, Rfl:  3    valsartan 40 MG tablet, Take 1 tablet (40 mg) by mouth 2 times daily, Disp: 180 tablet, Rfl: 1    venlafaxine ER 75 MG 24 hr capsule, Take 1 capsule (75 mg) by mouth daily., Disp: , Rfl:     Review of patient's allergies indicates:  Allergies   Allergen Reactions    Oxycodone Skin: Itching    Codeine ZO:XWRUEA/VWUJWJXB    Lisinopril Resp: Cough    Paclitaxel Skin: Hives    Walnut Skin: Rash       Review Of Symptoms    Constitutional: Denies unexpected weight loss, fever and chills.  HEENT: Denies change in vision and hearing.  Respiratory: Denies  cough.  SEE IH   CV: Denies Chest pain and palpitations.  See IH and notes cardiology   Denies Abdominal pain, NVD.  GU: Denies Dysuria and urinary frequency  MSK: Denies Myalgias and joint pain.   Skin: Denies rash and Pruritis.  Neuro: Denies HA and syncope.  Psychiatric: Denies recent changes in mood. Denies heightened  anxiety and depression. SEE IH     See Interval history for pertinent Positives.      Physical Exam    ECOG: (0) Fully active, able to carry on all predisease performance without restriction    Constitutional   General appearance, behavior, and attitude:   normal appearance, behavior, NAD    MME  SPEECH: grossly normal and responds to my cues  PERCEPTUAL disturbances: NO  THOUGHT process and content: normal: logical, coherent, goal-directed.  JUDGEMENT and insight: grossly normal judgment and insight  SENSORIUM and cognition: grossly normal  MOOD and affect: Normal, congruent, good eye contact.       Laboratory Data Reviewed   Laboratory Results: reviewed as available in Epic       ASSESSMENT and RECOMMENDATIONS  Yolanda Bowers is a 53 year old with history of  of high-grade infiltrating ductal carcinoma of the left breast (ER+/PR+, FISH +) with angiolymphatic invasion, history of neoadjuvant chemotherapy with ddAC-THP, left mastectomy with lymph node biopsy, received 4 or 6 cycles of herceptin and Perjeta due to drop in EF on  letrozole,    Also  VT, history of PVC ablation on 03/21/2020    Cont all recommendations of her oncology team and specialists       For consideration:    At risk for late cardiovascular sequelae:   Pt is being followed by [facility]      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             Yolanda Bowers risk of secondary skin cancers due to radiation therapy. The area of prior radiaiton is at risk for skin cancers.  We recommend self-exam by the patient on a monthly basis,. Recommend PCP perform or refer to  Dermatology for exam yearly or per result .  Pt to request a referral      At risk of renal compromise:   Yolanda Bowers  is at risk of renal compromise due to prior chemotherapy exposure-            Cyclophosphamide  Renal function, UA recommended yearly       At risk of bone mineral density loss:    being followed by her oncology team    Due to radiation exposure   Pt is currently on an AI                     Taxol (paclitaxel) therapy can lead to several late effects, particularly peripheral neuropathy, which includes symptoms like pain, numbness, and tingling in the extremities. Cognitive impairments, often referred to as "chemobrain," can also occur, affecting memory and concentration. Additionally, patients may experience fatigue and joint pain. These effects can significantly impact quality of life and may persist long after treatment ends. Managing these late effects often requires ongoing medical support and lifestyle adjustments   being followed by oncology       Healthcare Maintenance:   Yolanda Bowers should be seen by  a PCP at least yearly.  Yolanda Bowers  should  have a yearly CBC, CMP, Fasting lipids, glucose and HgbA1c should be checked yearly or as indicated by previous results.   Vitamin D should be checked at baseline. with therapeutic goal of 50.     Additionally, Yolanda Bowers  should be seen by:     Optometrist/Ophthalmologist yearly      Dentist every 6 months.       Referrals:   Neurocognitive testing      Future visits:   Return to Primary Care Provider as needed for annual physicals, acute care visits and vaccines.  Continue all follow up care with your entire CARE team as recommended.  Your next visit to the Survivor Program should be: 4 months   In the interim, please do not hesitate to contact us with any questions: [phone], [email or web address]      Pt aware that once the Care Plan signed off unless a discrepancy is discovered our team will not update it in the future unless patient is seen for consultation.    I spent a total of 70 minutes for the patient's care on the date of the service.        Components of the Survivorship Treatment Summary and Care Plan were reviewed with Yolanda Bowers. A copy will be emailed to Yolanda Bowers, and to her PCP.   Recommendations for ongoing screening of late effects of treatment are included at the end of the document. Preventive guidelines are included. Plan attached below.       Follow-up and Survivorship Care  Based on your oncologist recommendations, personal diagnosis, treatment exposures, and/or risk factors.    Last Done Frequency Provider to Contact Next Due   Oncology Follow-up 09/28/2023 6 months Linden/ APP team 03/2024   Survivorship 11/14/2023 4 months 02/2024 Yolanda Bowers    Mammogram (right) 09/01/2023 yearly Wadie Lessen 08/2024   Cardiology 10/20/2023 6 months Minami 05/22/2024   Cardiac: ECHO 10/20/2023 6 months Min[place] 05/22/2024   Neurology 03/2023  Chee    CPE and labs 2024-25  Ongoing  PCP, ONC AND cards     Pap smear  Ck on Pap smear   ? Last yr,  ? 2027 GYN     Colonoscopy - FMHX  Due   PCP- GI     Skin Exam  radiation  Consider a baseline  yearly PCP - DERM  Due   Bone Density (DEXA) 12/22/2021 2 years Wadie Lessen 12/2023     Wellness Screening  Based on [employer], [employer], Environmental manager, and Geophysicist/field seismologist of Gastroenterology   Test or Exam Frequency Provider to Contact   Well Physical  Skin, eyes, mouth, thyroid, carotids, heart, lungs, breast, abdomen, lymphatics, blood pressure, immunization status Yearly Primary Care   Immunizations Yearly  for flu shot Primary Care   Dental and Oral Twice a year  or as indicated Dentist   Vision Yearly  or as indicated Ophthalmology or Optometry   Mammogram Annually  age 67+ as indicated by personal and family risk factors Primary Care   Gynecologic Yearly pelvic and rectal exam to access pelvic organs  Pap/HPV screening every 3 years age 73-65, or as indicated by risk factors Primary Care or Gynecology   Screening Labs  CBC, liver, kidney, blood sugar, lipids, thyroid, urinalysis, fecal occult blood Every 1-5 years  depending on baseline lab values, age, and risk factors Primary Care   Colonoscopy / Sigmoidoscopy Every 3-10 years  depending  on risk factors, begin age 68 unless earlier symptoms Primary Care of Gastroenterology   Bone Density Evaluation   DEXA Baseline exam age 72-70  or as indicated by risk factors Primary Care

## 2023-11-17 ENCOUNTER — Ambulatory Visit
Admission: RE | Admit: 2023-11-17 | Discharge: 2023-11-17 | Disposition: A | Discharge status: Home / Self Care | Attending: Adult Congenital Heart Disease | Admitting: Adult Congenital Heart Disease

## 2023-11-17 ENCOUNTER — Telehealth (HOSPITAL_BASED_OUTPATIENT_CLINIC_OR_DEPARTMENT_OTHER)

## 2023-11-17 DIAGNOSIS — R5383 Other fatigue: Secondary | ICD-10-CM | POA: Insufficient documentation

## 2023-11-17 DIAGNOSIS — R0689 Other abnormalities of breathing: Secondary | ICD-10-CM | POA: Insufficient documentation

## 2023-11-17 DIAGNOSIS — Z170 Estrogen receptor positive status [ER+]: Secondary | ICD-10-CM

## 2023-11-17 DIAGNOSIS — T451X5A Adverse effect of antineoplastic and immunosuppressive drugs, initial encounter: Secondary | ICD-10-CM | POA: Insufficient documentation

## 2023-11-17 DIAGNOSIS — R0600 Dyspnea, unspecified: Secondary | ICD-10-CM | POA: Insufficient documentation

## 2023-11-17 DIAGNOSIS — I427 Cardiomyopathy due to drug and external agent: Secondary | ICD-10-CM | POA: Insufficient documentation

## 2023-11-17 LAB — PULMONARY FUNCTION TESTING
EXPTIME-Pre: 7.41 s
FEF25-75-%Pred-Pre: 96 %
FEF25-75-LLN: 1.4 L/s
FEF25-75-Pre: 2.5 L/s
FEF25-75-Pred: 2.58 L/s
FEV1-%Pred-Pre: 102 %
FEV1-LLN: 1.91 L
FEV1-Pre: 2.67 L
FEV1-Pred: 2.62 L
FEV1/FVC-%Pred-Pre: 98 %
FEV1/FVC-LLN: 69 %
FEV1/FVC-Pre: 79 %
FEV1/FVC-Pred: 80 %
FVC-%Pred-Pre: 104 %
FVC-LLN: 2.4 L
FVC-Pre: 3.39 L
FVC-Pred: 3.25 L
MVV-%Pred-Pre: 87 %
MVV-LLN: 79 L/min
MVV-Pre: 85 L/min
MVV-Pred: 97 L/min
PEF-Pre: 345.7 L/min

## 2023-11-17 NOTE — Telephone Encounter (Signed)
 Paperwork pending Dr Recardo Evangelist signature.

## 2023-11-17 NOTE — Progress Notes (Signed)
 [facility]IAL [facility] CLINICAL NOTE    ID: Nyleah Raetta Agostinelli is a 53 year old female with history of cT3N3 left breast invasive ductal carcinoma, ER/PR/HER2+.    Reason for referral to Social Work: Supportive Counseling   Encounter method: Telehealth: I conducted this encounter via secure, live, face-to-face video conference with the patient. I reviewed the risks and benefits of telemedicine as pertinent to this visit and the patient agreed to proceed. Patient was located at home. Provider was located at off-site location.  Encounter type: Return Social Work Visit  Present at the visit: Patient    ASSESSMENT:    Screening scores    PHQ-9 depression screening history:  PHQ9 Total Scores         10/12/2023 10/31/2023 11/16/2023       PHQ9 Total Score: 13  12  9             Thoughts that you would be better off dead or of hurting yourself in some way: (Patient-Rptd) 0- Not at all    PHQ-9 score indicates: 10 - 14: Moderate depression  PHQ-9 Flowsheet    GAD-7 anxiety screening history:  GAD7 Total Scores         10/12/2023 10/31/2023 11/16/2023       Total: 7  5  6             GAD-7 score indicates: 5 - 9: Mild anxiety  GAD-7 Flowsheet    Suicide assessment   Suicide assessment not indicated today.      Mental status exam  Appearance: Good grooming and hygiene  Behavior: Appropriately interactive w/ others  Speech: Normal rate, Normal rhythm  Affect: Appropriate  Mood: Patient reported "overwhelm"  Thought Form/Content: Coherent, Concrete  Attention/Concentration: Good  Memory: Normal  Judgement/Insight: Intact    Psychosocial background   Mental health history (diagnoses, in-pt/out-pt treatment, psychotropic medications, SI and/or trauma history, family MH history): Patient reported diagnosis of Major Depressive Disorder. Patient prescribed Venlafaxine 75 mg daily. Patient reported attending counseling at [facility] every couple weeks. Patient endorsed the use of trauma informed care and cognitive behavioral  therapy modalities. Patient reported that she previously saw Dr. Jenne Pane with [facility]. Patient reported experiencing symptoms of anxiety, low energy, and fatigue. Patient shared that she is beginning to track her sleep. Patient shared that some of the anxiety she is experiencing is due to fear of cancer reoccurrence or diagnosis of other health challenges.    Substance use history (alcohol, marijuana, recreational drugs): Per chart review, patient does not endorse use of tobacco or drug use. Patient does not currently endorse alcohol use. Patient endorses occasional wine, about 2 glasses per month.    Previous steroid use and adverse reaction: Not reviewed today    Coping skills and strengths: just keep going, hide and mask, walks, exercise, being creative, travel, paint, print making, [employer] activities    Barriers to activation (sleep, pain, exercise, and fatigue concerns): Anxiety, stress     Brief social history (childhood, family situation, current home/living situation, community supports, education, work, Hotel manager active duty/veteran status, cultural factors):   Patient lives in [place] with her 27 yo son, 50 yo son, and son's girlfriend. Patient shared that she her partner, Gigi Gin, lives in [place]. The couple have been together for a couple years. Patient currently in divorce process with her ex husband. Patient reported that they have not been living together for over a year and in the mediation stage. Patient shared there  are safety precautions in place to protect both herself and kids, including restraining order. Patient has a sister, brother, and sister-in law living in [place]. Patient works full-time as a Runner, broadcasting/film/video in the [employer] 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 Patient reported that she is aware of PFML and inquired about possibility last week Patient aware of pay cut if she does continue with PFML. 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 Patient finds joy in creativity, travelling, and spending time with her close loved ones.     Religious/spiritual background and affiliation: Not reviewed today    Treatment-related logistics/concerns  Caregiver plan for treatment: Not reviewed today    Lodging/housing plan for treatment: Lives in [place]    Travel/lodging benefit for treatment: Not reviewed today    Practical/logistical/financial concerns: To discuss further at next visit    Potential barriers to accessing care: Not reviewed today    Treatment preferences (communication, goals, care preferences): Communication, goals    Advance care planning: Not reviewed today    Release of Information (ROI): Not reviewed today  --  ENCOUNTER SUMMARY:  Tannie Quinetta Shilling is a 53 year old female with history of cT3N3 left breast invasive ductal carcinoma, ER/PR/HER2+.  Patient's psychometric responses indicate potential 10 - 14: Moderate depression and 5 - 9: Mild anxiety. Patient denies SI and HI, and reports feeling safe.      3/27: MSW conducted telehealth visit. Patient no showed. MSW to reach out to patient and reschedule for a later time.      3/11: Patient shared on feelings of exhaustion, as she is continuing to cope with mental and physical stressors. Patient reported on [facility] and potential options for work schedule. Patient shared that she is focusing on taking care of herself, in ways like "I am going to reclaim my life and regain my time/interests." Patient shared that she is taking a few classes a week through [employer] and a swim classes for physical activity.     2/20: MSW conducted initial assessment with patient. MSW shared that she has experienced fatigue, hot flashes, and nausea since chemo treatment. Patient currently on Letrozole since 2021. Patient presented to [facility] with new breast lump in December of 2024. Patient working with med team to assess. Patient shared that there are external  factors that have been causing increased anxiety and depression symptoms, as she is going through divorce proceedings. Patient expressed that she is focused on what is best for her and her sons. MSW provided space for patient to name and externally process emotions. Patient sees counselor in the community every other week. Patient expressed how she is engaging in activities that bring joy and ease during this time, including creative projects and quality time with her sons. Patient to meet with Cardio in a week.    Referrals:  [facility]  --  INTERVENTION:  MSW introduced self and role as resource-linker, advocate, and supportive counselor, Provided contact information, and Provided counseling support    Themes explored:  Adjustment to diagnosis/treatment, Logistical/practical stressors, Mood concerns/symptoms, and Relationships    Therapeutic interventions/approaches used:  Relaxation techniques, Self-compassion, and Supportive counseling    --  PLAN:   MSW appreciated the opportunity to connect with this patient. Patient is aware of Social Work role and services. Return visit planned and order placed for: two weeks    Time Spent - Direct Care: 60 minutes  Time Spent - Indirect Care: 20 minutes    Donell Beers  [phone]

## 2023-11-18 ENCOUNTER — Encounter (HOSPITAL_BASED_OUTPATIENT_CLINIC_OR_DEPARTMENT_OTHER): Payer: Self-pay | Admitting: Cardiovascular Disease

## 2023-11-18 NOTE — Telephone Encounter (Signed)
 Paperwork completed and sent to patient via [employer].

## 2023-11-19 ENCOUNTER — Other Ambulatory Visit (HOSPITAL_BASED_OUTPATIENT_CLINIC_OR_DEPARTMENT_OTHER): Payer: Self-pay

## 2023-11-19 DIAGNOSIS — C50412 Malignant neoplasm of upper-outer quadrant of left female breast: Secondary | ICD-10-CM

## 2023-11-24 ENCOUNTER — Ambulatory Visit
Admission: RE | Admit: 2023-11-24 | Discharge: 2023-11-24 | Disposition: A | Discharge status: Home / Self Care | Attending: Student in an Organized Health Care Education/Training Program | Admitting: Student in an Organized Health Care Education/Training Program

## 2023-11-24 ENCOUNTER — Ambulatory Visit (HOSPITAL_BASED_OUTPATIENT_CLINIC_OR_DEPARTMENT_OTHER): Payer: 59

## 2023-11-24 DIAGNOSIS — R0600 Dyspnea, unspecified: Secondary | ICD-10-CM | POA: Insufficient documentation

## 2023-11-24 DIAGNOSIS — R0689 Other abnormalities of breathing: Secondary | ICD-10-CM | POA: Insufficient documentation

## 2023-11-24 DIAGNOSIS — I427 Cardiomyopathy due to drug and external agent: Secondary | ICD-10-CM | POA: Insufficient documentation

## 2023-11-24 DIAGNOSIS — R5383 Other fatigue: Secondary | ICD-10-CM | POA: Insufficient documentation

## 2023-11-24 DIAGNOSIS — T451X5A Adverse effect of antineoplastic and immunosuppressive drugs, initial encounter: Secondary | ICD-10-CM | POA: Insufficient documentation

## 2023-11-26 LAB — PULMONARY FUNCTION TESTING
DL/VA-%Pred-Pre: 118 %
DL/VA-Pre: 4.61 ml/min/mmHg/L
DL/VA-Pred: 3.88 ml/min/mmHg/L
DLCOCOR-%Pred-Pre: 106 %
DLCOCOR-LLN: 15.22 ml/min/mmHg
DLCOCOR-Pre: 20.91 ml/min/mmHg
DLCOCOR-Pred: 19.64 ml/min/mmHg
DLCOUNC-%Pred-Pre: 100 %
DLCOUNC-LLN: 16.07 ml/min/mmHg
DLCOUNC-Pre: 20.91 ml/min/mmHg
DLCOUNC-Pred: 20.74 ml/min/mmHg
EXPTIME-%Change-Post: -21 %
EXPTIME-Post: 6.62 s
EXPTIME-Pre: 8.42 s
FEF25-75-%Change-Post: 29 %
FEF25-75-%Pred-Post: 115 %
FEF25-75-%Pred-Pre: 89 %
FEF25-75-LLN: 1.4 L/s
FEF25-75-Post: 2.97 L/s
FEF25-75-Pre: 2.3 L/s
FEF25-75-Pred: 2.58 L/s
FEV1-%Change-Post: 7 %
FEV1-%Pred-Post: 110 %
FEV1-%Pred-Pre: 102 %
FEV1-LLN: 1.91 L
FEV1-Post: 2.88 L
FEV1-Pre: 2.67 L
FEV1-Pred: 2.62 L
FEV1/FVC-%Change-Post: 3 %
FEV1/FVC-%Pred-Post: 98 %
FEV1/FVC-%Pred-Pre: 95 %
FEV1/FVC-LLN: 69 %
FEV1/FVC-Post: 79 %
FEV1/FVC-Pre: 76 %
FEV1/FVC-Pred: 80 %
FVC-%Change-Post: 4 %
FVC-%Pred-Post: 112 %
FVC-%Pred-Pre: 107 %
FVC-LLN: 2.4 L
FVC-Post: 3.65 L
FVC-Pre: 3.5 L
FVC-Pred: 3.25 L
HGB-Pre: 11.8 g/dL
MEP-%Pred-Pre: 31 %
MEP-Pre: 46 cmH2O
MEP-Pred: 148 cmH2O
MIP-%Pred-Pre: 62 %
MIP-Pre: -50 cmH2O
MIP-Pred: -79 cmH2O
PEF-%Change-Post: 3 %
PEF-Post: 334.1 L/min
PEF-Pre: 323.6 L/min
VA-%Pred-Pre: 84 %
VA-Pre: 4.54 L
VA-Pred: 5.34 L

## 2023-11-27 NOTE — Progress Notes (Signed)
 [facility] NUTRITION NOTE    GENERAL INFORMATION    Care Team:   Provider: Mitchel Honour, PA-C  Appointment Type: Survivorship  Diagnosis: s/p breast cancer      Interval History:  Pt seen today for the nutrition portion of her survivorship clinic.   Reports increased fatigue and weight gain since treatment; see below.     ASSESSMENT    Client History: pt is s/p breast cancer   Past Medical History:   Diagnosis Date    Chemotherapy adverse reaction 06/25/2019    Allergic rhinitis due to allergen     Anemia     Anxiety     Breast cancer (HCC)     Burn     Cancer (HCC)     Congestive heart failure (HCC)     Headache     History of radiation therapy      Past Surgical History:   Procedure Laterality Date    BREAST BIOPSY Right 11/2018    2 core bx      BREAST RECONSTRUCTION Right 06/25/2019    reduction - Dr. Charlene Brooke    PR MASTECTOMY SIMPLE COMPLETE Left 06/25/2019    Had radiation     Food and Nutrition-Related History:  Diet preference: No specific preference  Food allergies/intolerances: None  Knowledge/beliefs regarding food: n/a  Physical activity history: Reports "low activity."  On feet all day, however, limited activity due to cardiac issues.  Occasionally will take online or in-person classes via [employer].  Reports increased fatigue; sleeps 12 hrs/day.  Works full-time as Runner, broadcasting/film/video.   Substance use: 1 glass wine/wk       No data to display              Food Insecurity: Not on file     Medications: Reviewed    Dietary Supplements: Reviewed  Current Outpatient Medications   Medication Instructions    acetaminophen (TYLENOL) 500-1,000 mg, As needed    aspirin 81 mg    cetirizine 10 MG tablet Take by mouth.    cholecalciferol (VITAMIN D-3) 5,000 units, Daily    cyanocobalamin (VITAMIN B-12) 500 mcg, Daily    famotidine (PEPCID) 20 mg, As needed    letrozole (FEMARA) 2.5 mg, Oral, Daily    methocarbamol (ROBAXIN) 500 mg, Oral, Nightly PRN    metoprolol succinate ER (TOPROL XL) 25 mg, Oral, 2 times  daily, Do not chew or crush.    valsartan 40 MG tablet Take 1 tablet (40 mg) by mouth 2 times daily    venlafaxine ER (EFFEXOR XR) 75 mg, Daily     Intake:    Typical Day:  Breakfast: small bowl of oatmeal w/apple or blueberries or strawberries or granola bar, coffee  Lunch: Caesar salad w/cheese and salad dressing (and added broccoli) or slice of cauliflower crust pizza, or yogurt w/granola and frui  Snack: popcorn or hummus w/carrots or tortilla chips, apple  Dinner: varies and may include chicken/sweet potato or rice/broccoli, salad, pasta (tortellini w/cheese sauce, vegetable), homemade pizza, waffle  Snack: occasional slice of cake or ice cream  Beverages: sparkling water, water, almond milk, orange juice    Nutrition Impact Symptoms: Taste acuity is "okay;" not as good as it used to be.  Some mild tooth soreness - but does not feel this is currently impacting po intake. Nausea about once/wk and takes compazine; vomiting about once/month.  Generally has formed stools, however, reports diarrhea once/week and constipation once/week.     Anthropometrics:  Weight History:  usual weight:  65.9-68.2 kg (145-150 lbs); started gaining weight about 1 year ago to approx 72.7 kg (160 lbs).  Reports weight loss w/treatment, however, has recently been gaining weight - due to limited activity associated with cardiac compromise.  Wt Readings from Last 4 Encounters:   11/24/23 78 kg (171 lb 15.3 oz)   10/20/23 78.9 kg (174 lb)   09/28/23 78.5 kg (173 lb)   08/25/23 78.6 kg (173 lb 4.8 oz)     Ideal Body Weight: 57 kg  %IBW: 137%       Adjusted Weight: 62.3 kg (11/24/23)  Arrival Weight: 78 kg (11/24/2023)  Arrival Height: 164 cm (11/24/2023)             Labs/Procedures/Imaging: Lab results reviewed  Lab Results   Component Value Date    SODIUM 139 10/20/2023    POTASSIUM 4.1 10/20/2023    BUN 14 10/20/2023    CREATININE 0.74 10/20/2023    GLUCOSE 77 10/20/2023    MAGNESIUM 1.9 10/20/2023    CA 9.3 10/20/2023    PHOSPHATE 3.5  11/24/2020    ALBUMIN 4.4 10/20/2023     No results found for: "VITD"   Lab Results   Component Value Date    CHOLESTEROL 177 03/10/2023    TRIGLYCERIDE 69 03/10/2023    LDL 93 03/10/2023    HDL 71 03/10/2023    CHOLHDLRATIO 2.5 03/10/2023       DIAGNOSIS    Clinical:   -- Unintended weight gain related to medication known to cause weight gain, as evidenced by pt report of weight gain, Status: Active      Behavioral/Environmental:  -- Opportunity for nutrition education related to lack of prior nutrition education, as evidenced by no prior education provided on how to apply food and nutrition related information, Status: New      INTERVENTION    Nutrition Prescription:  Estimated Macronutrient Needs  Based on 62.3 kg weight (11/24/23)  1550-1850 kcal ( 25-30 kcal/kg)  50-60 g protein (0.8 g/kg)  1850 ml fluid ( 30 ml/kg)    Estimated Micronutrient Needs  Vitamin D 5000 units of cholecalciferol: recommend check serum 25-OH vitamin D level to evaluate appropriateness of current cholecalciferol dose    Recommended Diet: General    Nutrition Education and Counseling:  -- Nutrition Prescription Education: Reviewed weight loss strategies, Reviewed AICR/ACS recommendations on diet and lifestyle for cancer prevention and survivorship; also reviewed AICR guidelines specific to breast cancer survivors:  increase dietary fiber intake, inclusion of moderate amt of soy foods in diet, and decreased intake saturated fat.   Encourage whole foods diet, mostly plant-based; discussed meal planning.    Person(s) Instructed: Patient  Desire and Motivation to Learn: Patient/caregiver demonstrated understanding of information provided  Special Learning Needs: None  Teaching Methods Utilized: Explanation, Printed material   Handouts Provided: AICR Guidelines 11/24/23, Diet and Activity for weight loss 11/24/23    Coordination of Care: Recommend consult to  n/a      MONITORING AND EVALUATION    Nutrient Intake: % energy needs met, % protein needs  met, % fluid needs met  Imaging: Recommend consideration of dexa scan  Labs: Recommend check serum 25-OH vitamin D level  Physical Activity: Encourage regular aerobic exercise as tolerated to maintain muscle mass and bone density; also in view of increased fatigue, consider PT consult or cardiac rehab     Patient Acuity: Low nutrition acuity    MNT Service Group: Survivorship    Follow up:  Provided RD contact information      To patients reading this note: Please be advised that the primary purpose of this note is for me to communicate with other members of your medical team. Standard sentence structure is not always used. Medical terminology, abbreviations and acronyms may be used that are unfamiliar to non-health care affiliated individuals. Measurements are used for calculation purposes only. "Ideal" weights are not intended to serve as recommendations for healthy weight goals.

## 2023-11-28 ENCOUNTER — Other Ambulatory Visit (HOSPITAL_BASED_OUTPATIENT_CLINIC_OR_DEPARTMENT_OTHER): Payer: Self-pay

## 2023-11-28 DIAGNOSIS — Z029 Encounter for administrative examinations, unspecified: Secondary | ICD-10-CM

## 2023-11-28 NOTE — Progress Notes (Signed)
 Documentation encounter for oncology history abstraction and survivorship treatment summary creation in preparation for survivorship consult visit.

## 2023-11-29 ENCOUNTER — Encounter (HOSPITAL_BASED_OUTPATIENT_CLINIC_OR_DEPARTMENT_OTHER): Payer: Self-pay

## 2023-12-06 ENCOUNTER — Telehealth (HOSPITAL_BASED_OUTPATIENT_CLINIC_OR_DEPARTMENT_OTHER)

## 2023-12-14 NOTE — Progress Notes (Addendum)
 Yolanda Bowers    Patient Name:               Yolanda Bowers   Date of Bowers:     12/15/2023  Date of Feedback:         12/16/2023   Referring Provider:          Drexel Gentles, PA-C    SERVICE & TIME:  Service Code Duration (hours)   Clinical interview:   Neurobehavioral status exam  [phone] 1   Neuropsychological test administration and scoring by physician or other qualified healthcare professional 60454 0.5    [id number] 2.0   Bowers services: Integration of patient data, interpretation of standardized test results and clinical data, clinical decision making, and treatment planning and report writing. [id number] 1    [id number]  2   TELEHEALTH feedback session [phone] 1      The clinical interview and psychometric testing for this Bowers were conducted in-person at the [facility]) on 12/15/2023. A telehealth feedback appointment was conducted to the patient's home on 12/16/2023.      IDENTIFYING INFORMATION / REFERRAL INFORMATION: Yolanda Bowers is a 53 year old right-handed woman with history of high-grade infiltrating ductal carcinoma of the left breast (ER+/PR+, FISH +) with angiolymphatic invasion, history of neoadjuvant chemotherapy with ddAC-THP, left mastectomy with lymph node biopsy (November 2020), received 4 or 6 cycles of herceptin  and Perjeta  due to drop in EF on letrozole , VT, history of PVC ablation on 03/21/2020. She was referred by Drexel Gentles, PA-C, of [facility] for Bowers of cognitive strengths and weaknesses to inform treatment planning in the context of ongoing cognitive concerns.     PRIOR COGNITIVE TESTING: Yolanda Bowers did not report any prior comprehensive neuropsychological testing, and none could be found in available medical records.     CURRENT SELF-REPORTED COGNITIVE CONCERNS: Yolanda Bowers reported difficulties primarily with word-finding, attention/concentration, and being easily distracted. 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 She also reported attention difficulties such as forgetting what class period it is, and tracking information throughout her day. She also reported that she occasionally misplaces/loses items. In Bowers, she reported that she is easily distracted by students or other distractions in class, and then has difficulties getting back on-task, which then makes her feel frustrated and this makes cognitive lapses even more likely. She provided an example of working on a task in the classroom and then having a student ask her a question, which then makes her forget to complete the initial task, which is then frustrating and that this situation can repeat throughout the day. Regarding timeline, she reported an onset of cognitive symptoms within the past 3 years, and reported a gradual worsening of cognitive symptoms. She noted that her family and friends also has mentioned to her that they have noticed that she loses her attention/concentration, is fatigued, and is overwhelmed more easily.      FUNCTIONAL: Most recent ECOG = 0 as assessed by Drexel Gentles, PA-C, on 11/14/2023. Yolanda Bowers reported being independent for managing medications, handling finances, driving/transportation, household tasks, and self-care activities. She did report ongoing difficulties managing finances secondary to complex changes in the context of divorce, but noted that she is beginning to have more bill on autopay which is helping. She also noted some inattention and drifting attention while driving, but reported no accidents or close calls. She noted increased  effort to complete daily tasks at home such as washing dishes, citing ongoing motivation difficulties. She reported that she has been exercising more frequently in recent weeks.     NEUROIMAGING: Most recent MRI of the brain on 03/13/2023 was interpreted as revealing:   "Findings:   No restricted  diffusion to indicate the presence of acute or subacute   ischemia. No hemorrhage, mass effect or midline shift.   No enhancing brain lesions are identified to suggest intracranial   metastases.   No abnormal foci of susceptibility to suggest remote hemorrhage or   cavernoma.   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   There are no midline structural abnormalities.   The ventricles are normal in size and configuration. Basal cisterns   are unremarkable.  No extra-axial collections or other abnormality.   The major intracranial arterial vascular flow voids appear intact.   Orbits and contents are grossly unremarkable.   No evidence of active sinusitis. Trace fluid in the mastoid air cells.   Visualized pharynx is unremarkable."    CURRENT SYMPTOMS:  Vision:  Reported some mild blurry vision, otherwise adequate vision. No recent changes reported.    Hearing: No reported hearing difficulties or changes    Motor/Sensory:  Reported ongoing numbness in hands daily, especially in mornings. She reported occasionally dropping items which can affect her work unloading Arts administrator as Wellsite geologist. Reported some mild balance difficulties and occasionally stumbles especially when walking up stairs resulting in occasional falls. No reported tremors.    Nausea: Reported occasional nausea, no reported nausea on day of Bowers.      Pain: Reported ongoing pain in her lower back, feet, and hips. Estimated typical pain as 3/10 (0=no pain, 10=worst). 1/10 pain on day of Bowers.    Headaches: Reported occasional headaches in the mornings and at work, particularly after not having enough water.     Sleep: Reported ongoing hypersomnolence, with sleeping currently up to 10-12 per day which is an increase that she noticed around December 2024. She reported going to bed around 7:45pm and waking around 6:45a most nights.      Fatigue: Reported significant fatigue, estimated to be typically 7/10 (0=no fatigue, 10=worst fatigue). Current fatigue  rating on day of Bowers 4/10.    Recent Mood: Described recent mood as "more irritable than usual, feel like I have more depression." She noted significantly reduced initiation/motivation and overall significant anhedonia, as well as ongoing hypersomnolence (noted above). She denied recent suicidal ideation or history of suicide attempt.    Incontinence: Increased urgency of both bladder and bowel, but no reported accidents.  recent incontinence reported.      RELEVANT MEDICAL HISTORY: In addition to oncologic medical history described above,  Yolanda Bowers has a relevant medical history of anemia and congestive heart failure. She denied a history of seizures, sleep apnea, or stroke. She did report one head injury associated with loss of consciousness, reported that when she was in her 40s she was standing on a chair in the classroom and fell and hit her head, losing consciousness for approximately 1 to 2 minutes without any disorientation or post-traumatic amnesia; she went to the doctor who reportedly  stated she was "fine" and she was able to return to work duties. Yolanda Bowers reported a history of dementia in her paternal grandmother, with onset in her 6s and possibly from a vascular etiology.     Current Outpatient Medications   Medication Instructions    acetaminophen  (TYLENOL ) 500-1,000 mg, As  needed    aspirin 81 mg    cetirizine  10 MG tablet Take by mouth.    cholecalciferol (VITAMIN D-3) 5,000 units, Daily    cyanocobalamin (VITAMIN B-12) 500 mcg, Daily    famotidine  (PEPCID ) 20 mg, As needed    letrozole  (FEMARA ) 2.5 mg, Oral, Daily    methocarbamol  (ROBAXIN ) 500 mg, Oral, Nightly PRN    metoprolol  succinate ER (TOPROL  XL) 25 mg, Oral, 2 times daily, Do not chew or crush.    valsartan  40 MG tablet Take 1 tablet (40 mg) by mouth 2 times daily    venlafaxine  ER (EFFEXOR  XR) 75 mg, Daily     PSYCHIATRIC AND SUBSTANCE USE HISTORY: Yolanda Bowers self-reported a history of depression that she stated started following cancer  treatment. She currently works with a Veterinary surgeon in the community that she reports has been helpful, and they work on Chartered loss adjuster (grounding, mindfulness, centering). Detrice reported having generally limited social support in daily life. She also reported currently working with [facility] to address fear of cancer recurrence. She is currently prescribed venlfafaxine, and she reported that she considers this to affect her motivation and makes her feel "numb;" she reported that she accidentally missed some doses and "felt better" from a motivation standpoint but also had some subsequent experiences of "brain zings" described a small shock and some nausea, and since that time has been taking venflafaxine more regularly. She was open to meeting with [facility] for review of medications and reported side effects. She denied a history of suicidal ideation or attempt, hallucinations, delusions, or hospitalization for psychiatric symptoms. Yolanda Bowers denied a history of substance use disorder diagnosis or treatment. She reported very occasional alcohol consumption (1 glass of wine every few weeks), and denied use of cannabis, tobacco, or illicit drugs.      PSYCHOSOCIAL HISTORY: Ercilia was born in [place] . She reported having met all developmental milestones on-time. She denied a history of diagnosed learning disability or ADHD, or having to repeat a grade. She reported earning good grades throughout school. She earned a Manufacturing engineer in Programmer, multimedia (graduated in 2020). She earned her bachelor's degree in visual art and in Financial controller. She works as a Heritage manager, and reported being on temporary medical leave on the two weeks during this Bowers. She currently lives with her two sons and one of her sons has a girlfriend who also lives with them. She has a partner who lives in [place] and they have been together for a few years, and reported being in proceedings for divorce  from ex-husband.      BEHAVIORAL OBSERVATIONS/MENTAL STATUS: Yolanda Bowers was neatly dressed and appropriately groomed. She was completely oriented in all spheres assessed. Vision and hearing were adequate for testing purposes. There was no observed upper extremity weakness or tremor. Gait and balance were unremarkable. No word-finding difficulties were observed in conversation. Receptive language was intact during conversation and she did not require repetition of task instructions. Speech was normal in rate, prosody, and tone. Affect was broad and congruent with discussion. Mood was neutral. Thought processes were linear and goal directed. She appeared to provide good effort throughout the Bowers. Her score on an embedded and on a standalone performance validity indicator were each in the valid range. Overall, test results are thought to be a valid representation of the abilities assessed. Aside from use of facial masks worn by examiner and patient, tests were administered in a standardized manner.    TESTS ADMINISTERED: [employer]  Learning Test - Revised (Form 1); Brief Visuospatial Memory Test - Revised (Form 1); Wechsler Adult Intelligence Scale, 5th Edition, Digit Span Forward and Sequencing, Similarities, and Clinical cytogeneticist subtests; Ruff 2 & 7 Selective Attention Test; Trail Making Test - Part A and Part B; Symbol Digit Modalities Test - oral and written version; Verbal Fluency - Letter Cues (FAS); Verbal Fluency - Category Cue (animals); NAB Naming; Stroop Color and Word Test; Grooved Pegboard; Test of Premorbid Functioning; Dot Counting; Patient Health Questionnaire - 9; Generalized Anxiety Disorder - 7.     Percentiles represent reference group comparisons that have been made with respect to age peers alone, or for some measures based on age and other relevant demographic factors.     TEST RESULTS    Premorbid Functioning: Performance on a test of untimed word reading, which is resistant to changes in the  brain and therefore can serve as an estimation of premorbid functioning, was in the average range (66th percentile). This performance is generally consistent with her educational/occupational background and is therefore used as a Health and safety inspector which current performances are compared.    Verbal learning and memory performances reflected good learning/acquisition, mildly reduced retrieval compared to amount of previously learned words, but good retention with no evidence of forgetting previous information (HVLT-R). She performed in the average range for overall recall across repeated learning trials with a maximum of 10 out of 12 possible words recalled on the final trial. She demonstrated average range performance for delayed free recall overall and compared to amount learned, with 90% of previously learned words freely recalled. On recognition memory using a yes/no format, her performance was in the average range overall and notable for correctly identifying 11 of 12 target words with no false positive errors.   Visual learning and memory performances reflected excellent learning and recall (BVMT-R). She performed in the above average range across repeated learning trials, and delayed free recall also was in the above average range. Yes/no recognition was performed within expectations and at ceiling-level with 100% accuracy.    Speed of mental processing performances was a strength. She performed in the average to high average range across tasks. She performed in the average on a task of speeded color naming (Stroop Color Naming) and speeded word reading (Stroop Word Reading). On a task of digit-symbol substitution, her performances were in the average range when involving a motor component (SDMT-written) and in the above average range on the same task that minimized motor integration (SDMT-written). She performed in the high average range on a task of speeded counting with minimal motor integration demands   (WAIS-5 Naming Speed).   Simple and complex auditory attention and working memory performances were strengths. She performed in the average range for simple attention for repeating digits (WAIS-5 Digit Span Forward) and in the exceptionally high range for complex attention of re-ordering the sequence of digits (WAIS-5 Digit Span Sequencing). Despite these tasks being an auditory task, Yolanda Bowers reported having used a visualization strategy to track numbers.   Speeded visual scanning/attention requiring psychomotor integration was performed in the average range (Trail Making Test, Part A). On a task of visual selective attention her performance was in the average range for both speed and accuracy (Ruff 2 & 7 Selective Attention Test).   Executive functions were notable strengths. Performances were in the high average range for mental flexibility involving visual scanning and task switching (Trail Making Test, Part B), and in the average range speeded prepotent response inhibition (Stroop Color-Word  Interference). Verbal abstract reasoning was performed in the average range (WAIS-5 Similarities).  Verbal fluency was mildly variable, with performances ranging from low average when using letter cues (FAS) and in the high average range to a category cue (Animals).    Naming pictures was performed in the average range and at ceiling-level (NAB Naming).   Visuospatial construction was performed in the above average range for building/copying visual designs with blocks Programmer, multimedia) and was intact for copying simple geometric shapes (BVMT-R Copy).    Speeded fine motor dexterity was performed in the average range with her dominant right hand and in the high average range with her left hand (Grooved Pegboard)    Mood: Laquasia endorsed symptoms consistent with moderately-to-severely elevated depression (PHQ-9=15) and minimally elevated anxiety (GAD-7=4). On individual items she endorsed moderately to severely elevated  anhedonia, hypersomnolence, fatigue, concentration difficulties, and irritability, and mildly elevated down/depressed mood, appetite changes, negative self-appraisal, psychomotor changes, nervousness, and worrying. She denied recent suicidal ideation.       IMPRESSIONS:   Yolanda Bowers is a 53 year old woman with a history of breast cancer treated with neoadjuvant chemotherapy, left mastectomy, and received 4 or 6 cycles of herceptin  and Perjeta  due to drop in EF on letrozole , VT, history of PVC ablation on 03/21/2020. Yolanda Bowers reports difficulties primarily with word-finding, attention/concentration, and being easily distracted. She reports being independent for completing daily activities although these require increased effort and reliance on compensatory strategies.     In the context of a valid performance and estimated premorbid functioning in the average range, and in a testing environment setup to optimize cognitive functioning (minimal distractions, <2 hours of testing to minimize effects of cognitive fatigue), findings revealed overall intact cognitive functioning but with a very mild weakness (low average range performance) for phonemic fluency. In contrast, she demonstrated significant strengths in all other areas assessed, including learning/memory (both verbal and visual information), speed of information processing, attention (auditory and visual scanning), executive functions (mental flexibility/task switching, prepotent response inhibition, abstract reasoning), most language skills (naming, semantic fluency), visuospatial construction, and speeded fine motor dexterity bilaterally. Most notable strengths were in the areas of visual learning/memory and tasks involving visual processing (including speed of mental processing, attention/scanning, construction), and she was noted to use visualization strategies on certain auditory tasks (e.g., complex auditory attention) to support cognitive functioning. Yolanda Bowers  endorsed symptoms consistent with worsened depressed mood in recent months consisting primarily of anhedonia, fatigue, and hypersomnolence, as well as ongoing frustration due to cognitive lapses/inattention.     Overall, findings indicate intact cognitive functioning but with a mild weakness for phonemic fluency. In a testing environment that is intended to maximize cognitive functioning by limiting distractions, minimizing effects of fatigue, and focusing on individual cognitive areas, she currently does not demonstrate any patterns of consistent cognitive weakness and findings rather indicate non-specific variability for verbal fluency. She has significant strengths in all other areas assessed. Although variability in verbal fluency can be seen following cancer and treatment effects, it is most likely that the subtle non-specific findings observed represent impacts from modifiable factors of currently reported excessive daytime fatigue and hypersomnolence, mood symptoms (notably depression and anhedonia), and mild pain symptoms, which notably were quite minimal (or at least of typical severity for Yolanda Bowers) on the day of the current Bowers. Primary recommendations below include addressing these potentially modifiable factors to minimize their effects on cognitive functioning in daily life, as well as continued optimization of cognitive compensatory strategy use.  RECOMMENDATIONS  The following recommendations were discussed with Yolanda Bowers during a feedback appointment on 12/16/2023.     COMPENSATORY COGNITIVE STRATEGIES:  Recommend continued use of a calendar and single to-do list to keep track of tasks that are to be completed, and reference the list regularly. Continue to use digital calendar to track tasks/appointments. It also can be helpful to set aside a time each day and each week to review the to-do list.   Continue to use relative strengths of visual processing (e.g., visualization, as described a strategy she  used on auditory tests) to support cognitive functioning in daily life. Emphasize written notes as a visual cue/strategy given excellent visual processing.   Include in daily to-do list strategies such as taking regular breaks, and practicing in-the-moment stress management techniques such as breathing or mindfulness.  In the event of returning to work, I do consider it needed for Yolanda Bowers to be able to have time to incorporate cognitive strategies listed above, as well as extra time in the workday to implement cognitive strategies and consistent breaks to allow for fatigue management strategies.     MOOD MANAGEMENT: I recommend that Camisha continue to meet with her therapist in the community to continue building strategies to support mood in daily life.   To minimize the impact of stress on cognitive functioning in-the-moment, it is recommended that Yolanda Bowers utilize regular practice of behavioral strategies to minimize the impact of mood symptoms on daily functioning and cognitive functioning. Strategies such as diaphragmatic breathing, mindfulness, and in-the-moment stress management exercises can help our brain manage stress/mood and possibly reduce impact on cognitive functioning.   I also recommend that Yolanda Bowers work with her therapists in the community to address reported limited social support and explore ways to increase social connectedness in ways that she finds meaningful (e.g., teacher groups, etc.).   In addition, it is recommended that Yolanda Bowers meet with [facility] to review reported effects and side effects of mood medications, most notably amotivation and worsened hypersomnolence; I have reached out to the referring provider Drexel Gentles, PA-C, who has placed this referral.     FATIGUE MANAGEMENT:   Continue to engage in consistent physical activity, as is deemed safe by your medical team. Continue to pace physical activity by balancing activity while also minimizing instances of overexerting to the point of worsening  fatigue.   Consider using regular, brief breaks (ex: such as a 10-minute break every hour) as an energy conservation and pacing strategy and to maximize activity throughout the day.   Keep a consistent sleep/wake schedule to support sleep hygiene and consistent energy levels.     FOLLOW-UP Bowers: It is recommended that Yolanda Bowers, her medical team, and support network continue to monitor her thinking skills and report any changes to her team. If needed, a future repeat neuropsychological Bowers could be conducted to evaluate for cognitive changes by using the current Bowers as a personal baseline.     Yolanda General, PhD  Clinical Neuropsychologist  [facility]      ------------------------------------------------------------------------------------------------------------------------------------  ------------------------------------------------------------------------------------------------------------------------------------  Note: Performances on neuropsychological tests should be interpreted only by qualified professionals with training in neuropsychological assessment and psychometric properties individual tests. See clinical report above for interpretation of raw and percentile data. Score labels below are based on reference group data percentiles and uniform labeling as described in Guilmette et al. (2020; The Clinical Neuropsychologist).     Test  Raw Score  Percentile  Score Label   LEARNING AND MEMORY  Hopkins Verbal Learning Test - Revised (HVLT-R)                Total Recall (8, 9, 10) 27 45 Average          Delayed Recall 9 27 Average          Percent Retained 90 39 Average          Recognition Discrimination Index   11 (0 FP) 55 Average   Brief Visuospatial Memory Test-Revised (BVMT-R)               Total Recall (9, 12, 12)  33 96 Above Average          Delayed Recall  12 93 Above Average          Percent Retained  100 >16 Within Expectations          Recognition  Discrimination Index  6 (0 FP) >16 Within Expectations   SPEED OF MENTAL PROCESSING          Symbol Digit Modalities Test- Written (SDMT) 57 (2 err) 70 Average   Symbol Digit Modalities Test - Oral (SDMT) 70 (0 err) 91 Above Average   Stroop Word Reading 98 25 Average   Stroop Color Naming 77 66 Average   WAIS-5 Naming Speed Quantity 15 84 High Average   ATTENTION AND WORKING MEMORY   WAIS-5 Digit Span Forward (longest span = 6)  11 50 Average   WAIS-5 Digit Span Sequencing (longest span = 16) 16 98 Exceptionally High   Ruff 2 & 7 Selective Attention Test - Total Speed 56T 73 Average   Ruff 2 & 7 Selective Attention Test - Total Accuracy 99T 47 Average   Trail Making Test, Part A (0 errors) 20 73 Average   EXECUTIVE FUNCTIONS   Trail Making Test, Part B (0 errors) 40 84 High Average   Stroop Color-Word  45 61 Average   WAIS-5 Similarities 30 63 Average   LANGUAGE   Verbal Fluency - Letter Cues (FAS) 35 14 Low Average   Verbal Fluency - Category Cue (Animals) 28 82 High Average   NAB Naming 31 61 Average   VISUOSPATIAL    WAIS-5 Block Design 53 95 Above Average   BVMT-R Copy 12 -- Within Expectations   PSYCHOMOTOR SPEED   Grooved Pegboard - Dominant (L) (0 drop) 57 73 Average   Grooved Pegboard - Non-dominant (R) (0 drop) 59 87 High Average   MOOD   PHQ-9 15 -- Moderate-Severe   GAD-7 4 -- Minimal

## 2023-12-15 ENCOUNTER — Ambulatory Visit

## 2023-12-15 DIAGNOSIS — C773 Secondary and unspecified malignant neoplasm of axilla and upper limb lymph nodes: Secondary | ICD-10-CM | POA: Insufficient documentation

## 2023-12-15 DIAGNOSIS — R4189 Other symptoms and signs involving cognitive functions and awareness: Secondary | ICD-10-CM | POA: Insufficient documentation

## 2023-12-15 DIAGNOSIS — Z79811 Long term (current) use of aromatase inhibitors: Secondary | ICD-10-CM | POA: Insufficient documentation

## 2023-12-15 DIAGNOSIS — C50812 Malignant neoplasm of overlapping sites of left female breast: Secondary | ICD-10-CM | POA: Insufficient documentation

## 2023-12-15 DIAGNOSIS — Z170 Estrogen receptor positive status [ER+]: Secondary | ICD-10-CM

## 2023-12-16 ENCOUNTER — Ambulatory Visit

## 2023-12-16 DIAGNOSIS — Z170 Estrogen receptor positive status [ER+]: Secondary | ICD-10-CM

## 2023-12-16 DIAGNOSIS — R4189 Other symptoms and signs involving cognitive functions and awareness: Secondary | ICD-10-CM

## 2023-12-16 NOTE — Progress Notes (Signed)
 [facility] Neuropsychology     Met with patient over video conference to conduct feedback session of neuropsychological evaluation. Reviewed results and recommendations and answered questions. Full neuropsychological evaluation summary report will be posted as an addendum to the date of initial interview and testing (12/15/2023), which also includes billing information including for clinical interview, testing, scoring, interpretation of findings, report writing, and feedback appointment.     As a result of this evaluation the patient and I discussed a plan for me to facilitate a referral to [facility] (patient previously worked with Dr. Tellis Feathers) to discuss questions the patient has about medication management for mood. I will work with referring provider, Drexel Gentles, PA-C, to facilitate this referral if agreed it is indicated.     Vanessa General, PhD  Clinical Neuropsychologist  [facility]

## 2023-12-19 ENCOUNTER — Other Ambulatory Visit (HOSPITAL_BASED_OUTPATIENT_CLINIC_OR_DEPARTMENT_OTHER): Payer: Self-pay

## 2023-12-19 ENCOUNTER — Telehealth (HOSPITAL_BASED_OUTPATIENT_CLINIC_OR_DEPARTMENT_OTHER)

## 2023-12-19 DIAGNOSIS — Z170 Estrogen receptor positive status [ER+]: Secondary | ICD-10-CM

## 2023-12-19 NOTE — Progress Notes (Incomplete)
 [facility] SOCIAL [facility] CLINICAL NOTE    ID: Yolanda Bowers is a 53 year old female with history of cT3N3 left breast invasive ductal carcinoma, ER/PR/HER2+.    Reason for referral to Social Work: Supportive Counseling   Encounter method: Telehealth: I conducted this encounter via secure, live, face-to-face video conference with the patient. I reviewed the risks and benefits of telemedicine as pertinent to this visit and the patient agreed to proceed. Patient was located at home. Provider was located at off-site location.  Encounter type: Return Social Work Visit  Present at the visit: Patient    ASSESSMENT:    Screening scores    PHQ-9 depression screening history:  PHQ9 Total Scores         10/12/2023 10/31/2023 11/16/2023 12/18/2023    PHQ9 Total Score: 13  12  9  10            Thoughts that you would be better off dead or of hurting yourself in some way: (Patient-Rptd) 0- Not at all    PHQ-9 score indicates: 10 - 14: Moderate depression  PHQ-9 Flowsheet    GAD-7 anxiety screening history:  GAD7 Total Scores         10/12/2023 10/31/2023 11/16/2023 12/18/2023    Total: 7  5  6  7            GAD-7 score indicates: 5 - 9: Mild anxiety  GAD-7 Flowsheet    Suicide assessment {Tip  If patient scores 1+ on the 9th question of PHQ-9, please proceed to complete the C-SSRS Flowsheet and Safety Assessment. If patient scores moderate+, then complete Safety Plan as well :999}  Suicide assessment not indicated today.  {Tip  Suicide Assessment can be entered via SmartPhrase .FHCCSWSUICIDE:999}    Mental status exam  Appearance: Good grooming and hygiene  Behavior: Appropriately interactive w/ others  Speech: Normal rate, Normal rhythm  Affect: Appropriate  Mood: Patient reported "overwhelm"  Thought Form/Content: Coherent, Concrete  Attention/Concentration: Good  Memory: Normal  Judgement/Insight: Intact    Psychosocial background {Tip  Please do not delete sections below. If not addressed, please write "Not reviewed today."  This will allow for easy completion at future visits. :999}  Mental health history (diagnoses, in-pt/out-pt treatment, psychotropic medications, SI and/or trauma history, family MH history): Patient reported diagnosis of Major Depressive Disorder. Patient prescribed Venlafaxine  75 mg daily. Patient reported attending counseling at [facility] every couple weeks. Patient endorsed the use of trauma informed care and cognitive behavioral therapy modalities. Patient reported that she previously saw Dr. Tellis Feathers with [facility]. Patient reported experiencing symptoms of anxiety, low energy, and fatigue. Patient shared that she is beginning to track her sleep. Patient shared that some of the anxiety she is experiencing is due to fear of cancer reoccurrence or diagnosis of other health challenges.    Substance use history (alcohol, marijuana, recreational drugs): Per chart review, patient does not endorse use of tobacco or drug use. Patient does not currently endorse alcohol use. Patient endorses occasional wine, about 2 glasses per month.    Previous steroid use and adverse reaction: Not reviewed today    Coping skills and strengths: just keep going, hide and mask, walks, exercise, being creative, travel, paint, print making, [employer] activities    Barriers to activation (sleep, pain, exercise, and fatigue concerns): Anxiety, stress     Brief social history (childhood, family situation, current home/living situation, community supports, education, work, Hotel manager active duty/veteran status, cultural factors):  Patient lives in [place] with her 74 yo son, 19 yo son, and son's girlfriend. Patient shared that she her partner, Vella Gey, lives in [place]. The couple have been together for a couple years. Patient currently in divorce process with her ex husband. Patient reported that they have not been living together for over a year and in the mediation stage. Patient shared there are safety precautions in place  to protect both herself and kids, including restraining order. Patient has a sister, brother, and sister-in law living in [place]. Patient works full-time as a Runner, broadcasting/film/video in the [employer] 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 Patient reported that she is aware of PFML and inquired about possibility last week Patient aware of pay cut if she does continue with PFML. 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 Patient finds joy in creativity, travelling, and spending time with her close loved ones.     Religious/spiritual background and affiliation: Not reviewed today    Treatment-related logistics/concerns  Caregiver plan for treatment: Not reviewed today    Lodging/housing plan for treatment: Lives in [place]    Travel/lodging benefit for treatment: Not reviewed today    Practical/logistical/financial concerns: To discuss further at next visit    Potential barriers to accessing care: Not reviewed today    Treatment preferences (communication, goals, care preferences): Communication, goals    Advance care planning: Not reviewed today    Release of Information (ROI): Not reviewed today  --  ENCOUNTER SUMMARY:  Yolanda Bowers is a 54 year old female with history of cT3N3 left breast invasive ductal carcinoma, ER/PR/HER2+.  Patient's psychometric responses indicate potential 10 - 14: Moderate depression and 5 - 9: Mild anxiety. Patient denies SI and HI, and reports feeling safe.    4/28: Patient reported on recent trip to [place] with [name]. Patient     Recent trip [place], [place] - time to get away from responsibilites, renew, intentional, wander, walk on the beach, healthy eating,  Disconnected, isolated, yearning for connection, not seeing , [employer] viritual, creative?   Neuropsych, seeing at Dr. Tellis Feathers, effexor  side effects, depression  20-30 min timer  30 min exercise per day, walks  830 M/T/Fri    Resources to Send:  -  Support Groups - cancer lifeline, cancer pathways, parent group  - Art Therapy  - BA ideas - set timers,    3/27: MSW conducted telehealth visit. Patient no showed. MSW to reach out to patient and reschedule for a later time.      3/11: Patient shared on feelings of exhaustion, as she is continuing to cope with mental and physical stressors. Patient reported on PFMLA and potential options for work schedule. Patient shared that she is focusing on taking care of herself, in ways like "I am going to reclaim my life and regain my time/interests." Patient shared that she is taking a few classes a week through [employer] and a swim classes for physical activity.     2/20: MSW conducted initial assessment with patient. MSW shared that she has experienced fatigue, hot flashes, and nausea since chemo treatment. Patient currently on Letrozole  since 2021. Patient presented to [facility] with new breast lump in December of 2024. Patient working with med team to assess. Patient shared that there are external factors that have been causing increased anxiety and depression symptoms, as she is going through divorce proceedings. Patient expressed that she is focused  on what is best for her and her sons. MSW provided space for patient to name and externally process emotions. Patient sees counselor in the community every other week. Patient expressed how she is engaging in activities that bring joy and ease during this time, including creative projects and quality time with her sons. Patient to meet with [facility] in a week.    Referrals:  [facility]  --  INTERVENTION:  MSW introduced self and role as resource-linker, advocate, and supportive counselor, Provided contact information, and Provided counseling support    Themes explored:  Adjustment to diagnosis/treatment, Logistical/practical stressors, Mood concerns/symptoms, and Relationships    Therapeutic interventions/approaches used:  Relaxation techniques,  Self-compassion, and Supportive counseling    --  PLAN: {Tip  For F/U SW visits please schedule this with patient when possible, and place order for time discussed, indicating to [facility] no confirmation necessary. :999}  MSW appreciated the opportunity to connect with this patient. Patient is aware of Social Work role and services. Return visit planned and order placed for: two weeks    Time Spent - Direct Care: 60 minutes  Time Spent - Indirect Care: 20 minutes    Rosanna Comment  [phone]

## 2023-12-23 ENCOUNTER — Encounter (HOSPITAL_BASED_OUTPATIENT_CLINIC_OR_DEPARTMENT_OTHER): Payer: Self-pay

## 2023-12-26 ENCOUNTER — Encounter (HOSPITAL_BASED_OUTPATIENT_CLINIC_OR_DEPARTMENT_OTHER): Payer: Self-pay

## 2024-01-06 ENCOUNTER — Ambulatory Visit: Admitting: Psychiatry

## 2024-01-06 ENCOUNTER — Other Ambulatory Visit (HOSPITAL_BASED_OUTPATIENT_CLINIC_OR_DEPARTMENT_OTHER): Payer: Self-pay

## 2024-01-06 DIAGNOSIS — Z029 Encounter for administrative examinations, unspecified: Secondary | ICD-10-CM

## 2024-01-06 DIAGNOSIS — F419 Anxiety disorder, unspecified: Secondary | ICD-10-CM

## 2024-01-06 DIAGNOSIS — Z170 Estrogen receptor positive status [ER+]: Secondary | ICD-10-CM

## 2024-01-06 DIAGNOSIS — F331 Major depressive disorder, recurrent, moderate: Secondary | ICD-10-CM

## 2024-01-06 DIAGNOSIS — C50412 Malignant neoplasm of upper-outer quadrant of left female breast: Secondary | ICD-10-CM
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
## 2024-01-06 NOTE — Patient Instructions (Addendum)
 Hi Yolanda Bowers,    I was so grateful to see you back in reconsultation today.  Thank you for meeting with me again in [facility] psychiatry.  It has been a privilege to take part in your cancer care, both back in 2021 and again now.  Since I cannot unfortunately offer ongoing consultation, I want to summarize the recommendations from my note, which I encourage you to take back to your primary care provider.  Please let me know if you or PCP have any questions about these.  I want to make sure the recommendations are as clear and helpful as possible.    Continue medication management with PCP; specifically recommend:  Titrating venlafaxine  ER from 75 to 150 mg p.o. every morning for depression and anxiety.    Discussed typical therapeutic range 75 to 225 mg daily. If she has partial benefit at 150 mg, can increase by 37.5 to 75 mg increments every 4 to 6 weeks within the therapeutic range.  Reviewed typical onset of action as initial benefit starting at about 2 weeks, but 4 to 6 weeks for full effect at a given dose.    Discussed activation and transient increase in anxiety with dose increases.  She may benefit from hydroxyzine pamoate 25 to 50 mg p.o. every 6 hours as needed for acute anxiety for the first 2 weeks of dose increase.  Also discussed that if she wants to discontinue in the future, I would recommend a very gradual taper, recognizing SNRI discontinuation symptoms.  Typical taper schedule would be reduction by 37.5 mg every 4 to 6 weeks as tolerated until reaching 37.5 mg daily.  Some patients can stop directly from this dose, while others may need to alternate every other day dosing for 2-4 weeks to minimize discontinuation symptoms.  If higher venlafaxine  is not effective or poorly tolerated, she could alternatively try:  Cross titration to escitalopram, typical target dose 10 to 20 mg p.o. daily.  Cross titration to, or augmentation with, bupropion XL, typical starting dose 150 mg p.o. every morning, therapeutic  range 150 to 450 mg p.o. every morning.  We discussed that this would likely target depression, cognitive symptoms, and possibly fatigue, though could worsen anxiety.  Cross titration to, or augmentation with, buspirone, typical starting dose 5-10 mg p.o. twice daily, increase in 5 to 10 mg increments to a therapeutic range typically 20-60 mg PO daily.  Continue excellent work in psychotherapy for depression and anxiety, building coping and distress tolerance skills  Similar to other providers evaluations, consider structured physical activity given fatigue and depression, consider PT or cardiac rehab if more formal support is needed  Please follow-up cognitive recommendations from Dr. Rockne Chyle neuropsych evaluation.  [facility] psychiatry cannot offer ongoing consultation, but I encouraged her to reach out if she has questions about today's evaluation or recommendations.  Advise seeking psychiatry consultation through [facility]CP/[facility] if longer-term psychiatric guidance is needed.  PCP could also access [facility] [place] -based Psychiatric Consult Line for management recommendations, [email or web address]  Crisis and clinic contact information sent in AVS     We are not scheduling psychiatry follow-up, but if you have questions during your care transition, you are welcome to contact me by MyChart or call our service at [phone] for non-urgent questions. Messages are typically returned within 1-2 business days. I offer crisis resources to all patients, below, if you have urgent safety issues or are in crisis. [facility] psychiatry can offer care to patients with mental health needs while in active treatment for  cancer. If you find yourself in this situation in the future, I would be happy to consult with you again.    Thank you again for your work with me, and I wish you the best moving forward.    Warm regards,  Dr. Doralee Gallop    Crisis Resources  National Suicide and Crisis Lifeline: 9-8-8  We can all help  prevent suicide. The Lifeline provides 24/7, free and confidential support for people in distress, prevention and crisis resources for you or your loved ones, and best practices for professionals in the [place] .

## 2024-01-06 NOTE — Addendum Note (Signed)
 Addended byEarlene Gleason, Epsie Walthall on: 01/06/2024 02:41 PM     Modules accepted: Level of Service
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
## 2024-01-23 ENCOUNTER — Telehealth (HOSPITAL_BASED_OUTPATIENT_CLINIC_OR_DEPARTMENT_OTHER)

## 2024-01-23 DIAGNOSIS — Z170 Estrogen receptor positive status [ER+]: Secondary | ICD-10-CM

## 2024-01-23 NOTE — Progress Notes (Signed)
 [facility] SOCIAL [facility] CLINICAL NOTE    ID: Rosabella Dionicia Cerritos is a 53 year old female with history of cT3N3 left breast invasive ductal carcinoma, ER/PR/HER2+.    Reason for referral to Social Work: Supportive Counseling   Encounter method: Telehealth: I conducted this encounter via secure, live, face-to-face video conference with the patient. I reviewed the risks and benefits of telemedicine as pertinent to this visit and the patient agreed to proceed. Patient was located at home. Provider was located at off-site location.  Encounter type: Return Social Work Visit  Present at the visit: Patient    ASSESSMENT:    Screening scores    PHQ-9 depression screening history:  PHQ9 Total Scores         10/12/2023 10/31/2023 11/16/2023 12/18/2023 01/06/2024 01/23/2024    PHQ9 Total Score: 13  12  9  10  13 7            Thoughts that you would be better off dead or of hurting yourself in some way: (Patient-Rptd) 0- Not at all    PHQ-9 score indicates: 10 - 14: Moderate depression  PHQ-9 Flowsheet    GAD-7 anxiety screening history:  GAD7 Total Scores         10/12/2023 10/31/2023 11/16/2023 12/18/2023 01/06/2024 01/23/2024    Total: 7  5  6  7  10 5            GAD-7 score indicates: 5 - 9: Mild anxiety  GAD-7 Flowsheet    Suicide assessment   Suicide assessment not indicated today.      Mental status exam  Appearance: Good grooming and hygiene  Behavior: Appropriately interactive w/ others  Speech: Normal rate, Normal rhythm  Affect: Appropriate  Mood: Patient reported "overwhelm"  Thought Form/Content: Coherent, Concrete  Attention/Concentration: Good  Memory: Normal  Judgement/Insight: Intact    Psychosocial background   Mental health history (diagnoses, in-pt/out-pt treatment, psychotropic medications, SI and/or trauma history, family MH history): Patient reported diagnosis of Major Depressive Disorder. Patient prescribed Venlafaxine  75 mg daily. Patient reported attending counseling at [facility] every couple weeks. Patient  endorsed the use of trauma informed care and cognitive behavioral therapy modalities. Patient reported that she previously saw Dr. Tellis Feathers with [facility]. Patient reported experiencing symptoms of anxiety, low energy, and fatigue. Patient shared that she is beginning to track her sleep. Patient shared that some of the anxiety she is experiencing is due to fear of cancer reoccurrence or diagnosis of other health challenges.    Substance use history (alcohol, marijuana, recreational drugs): Per chart review, patient does not endorse use of tobacco or drug use. Patient does not currently endorse alcohol use. Patient endorses occasional wine, about 2 glasses per month.    Previous steroid use and adverse reaction: Not reviewed today    Coping skills and strengths: just keep going, hide and mask, walks, exercise, being creative, travel, paint, print making, [employer] activities    Barriers to activation (sleep, pain, exercise, and fatigue concerns): Anxiety, stress     Brief social history (childhood, family situation, current home/living situation, community supports, education, work, Hotel manager active duty/veteran status, cultural factors):   Patient lives in [place] with her Andria Keeler, 51 yo son, Christian Cowden 67 yo son, and son's girlfriend, Maisy. Patient shared that she her partner, Vella Gey, lives in [place]. The couple have been together for a couple years. Patient currently in divorce process with her ex husband. Patient reported that they have not been living together for  over a year and in the mediation stage. Patient shared there are safety precautions in place to protect both herself and kids, including restraining order. Patient has a sister, brother, and sister-in law living in [place]. Patient works full-time as a Runner, broadcasting/film/video in the [employer] 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 Patient reported that she is aware of PFML and inquired about possibility last week  Patient aware of pay cut if she does continue with PFML. 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 Patient finds joy in creativity, travelling, and spending time with her close loved ones.     Religious/spiritual background and affiliation: Not reviewed today    Treatment-related logistics/concerns  Caregiver plan for treatment: Not reviewed today    Lodging/housing plan for treatment: Lives in [place]    Travel/lodging benefit for treatment: Not reviewed today    Practical/logistical/financial concerns: To discuss further at next visit    Potential barriers to accessing care: Not reviewed today    Treatment preferences (communication, goals, care preferences): Communication, goals    Advance care planning: Not reviewed today    Release of Information (ROI): Not reviewed today  --  ENCOUNTER SUMMARY:  Annmarie Tionna Gigante is a 53 year old female with history of cT3N3 left breast invasive ductal carcinoma, ER/PR/HER2+.  Patient's psychometric responses indicate potential 10 - 14: Moderate depression and 5 - 9: Mild anxiety. Patient denies SI and HI, and reports feeling safe.    6/2: Patient reported on Psychiatry consultation with Dr. Tellis Feathers. Patient shared on shifting medication, as she is adding an extra dose at night. Patient reported since making the shift, she has noticed changes in productivity, motivation, and not waking up in the middle of the night drenched in sweat. Patient reported on experiencing severe hot flashes, estimating about 30-40 per day. Patient expressed ways in which she is setting boundaries and making time for herself. Patient shared on 3--45 min walks in the garden, organizing her household, and intentional time for creativity. Patient shared on, saving seeds for her future self. Patient and MSW explored themes of blooming and setting herself up for the future. Patient to visit [place] from July to August.    4/28: Patient reported on recent trip to [place]  with [name]. Patient expressed gratitude for time to renew and away from responsibilities. Patient shared that they walked on the beach, ate with intention, and wandered.     Patient shared goals of walking 30 minutes per day for body movement. Discussed setting timers for activity scheduling and daily goals. Discussed Behavioral Activation strategies.Patient expressed feelings of isolation and disconnection. Patient shared that she is yearning for connection. MSW and patient brainstormed possibilities - support groups, creative spaces.     Patient engaged with [facility]. Patient reported hopes of discussion of Effexor  side effects and depression symptoms.    Resources to Send:  - Support Groups - cancer lifeline, cancer pathways, parent group  - Art Therapy  - BA ideas - set timers    3/27: MSW conducted telehealth visit. Patient no showed. MSW to reach out to patient and reschedule for a later time.    3/11: Patient shared on feelings of exhaustion, as she is continuing to cope with mental and physical stressors. Patient reported on PFMLA and potential options for work schedule. Patient shared that she is focusing on taking care of herself, in ways like "I am going to reclaim my life  and regain my time/interests." Patient shared that she is taking a few classes a week through [employer] and a swim classes for physical activity.     2/20: MSW conducted initial assessment with patient. MSW shared that she has experienced fatigue, hot flashes, and nausea since chemo treatment. Patient currently on Letrozole  since 2021. Patient presented to [facility] with new breast lump in December of 2024. Patient working with med team to assess. Patient shared that there are external factors that have been causing increased anxiety and depression symptoms, as she is going through divorce proceedings. Patient expressed that she is focused on what is best for her and her sons. MSW provided space for patient to name and externally  process emotions. Patient sees counselor in the community every other week. Patient expressed how she is engaging in activities that bring joy and ease during this time, including creative projects and quality time with her sons. Patient to meet with [facility] in a week.    Referrals:  [facility]  --  INTERVENTION:  MSW introduced self and role as resource-linker, advocate, and supportive counselor, Provided contact information, and Provided counseling support    Themes explored:  Adjustment to diagnosis/treatment, Logistical/practical stressors, Mood concerns/symptoms, and Relationships    Therapeutic interventions/approaches used:  Relaxation techniques, Self-compassion, and Supportive counseling    --  PLAN:   MSW appreciated the opportunity to connect with this patient. Patient is aware of Social Work role and services. Return visit planned and order placed for: 8/22    Time Spent - Direct Care: 60 minutes  Time Spent - Indirect Care: 20 minutes    Rosanna Comment  [phone]

## 2024-02-14 ENCOUNTER — Other Ambulatory Visit (HOSPITAL_BASED_OUTPATIENT_CLINIC_OR_DEPARTMENT_OTHER): Payer: Self-pay | Admitting: Cardiovascular Disease

## 2024-02-14 DIAGNOSIS — I429 Cardiomyopathy, unspecified: Secondary | ICD-10-CM

## 2024-02-17 MED ORDER — VALSARTAN 40 MG OR TABS
ORAL_TABLET | ORAL | 2 refills | Status: AC
Start: 2024-02-17 — End: ?

## 2024-04-12 ENCOUNTER — Ambulatory Visit

## 2024-04-12 DIAGNOSIS — C50412 Malignant neoplasm of upper-outer quadrant of left female breast: Secondary | ICD-10-CM

## 2024-04-12 DIAGNOSIS — Z170 Estrogen receptor positive status [ER+]: Secondary | ICD-10-CM

## 2024-04-12 NOTE — Progress Notes (Signed)
 To patients reading this note: Please be advised that the primary purpose of this note is for me to communicate other members of your medical team. Standard sentence structure is not always used. Medical terminology, abbreviations, and acronyms may be used that are unfamiliar to non-health care affiliated individuals. Parts of this note may have been generated using voice dictation software. There may be wrong-word or sound-alike substitution errors and typographical errors missed in proofreading.     [facility] Cancer Survivor Telehealth Note.      Distant Site Telemedicine Encounter  I conducted this encounter via secure, live, face-to-face video conference with the patient. I reviewed the risks and benefits of telemedicine as pertinent to this visit and the patient agreed to proceed.    Provider Location: On-site location (clinic, hospital, on-site office)  Patient Location: At home    Present with patient: No one else present        VISIT INFORMATION   Type of Visit: Telemedicine Return  Referral Reason: check in   Referral Source: No ref. provider found      Chief Complaint  Yolanda Bowers presents to  follow up with Survivorship    Interval History  Yolanda Bowers is a 53 year old female with history of high-grade infiltrating ductal carcinoma of the left breast (ER+/PR+, FISH +) with angiolymphatic invasion, history of neoadjuvant chemotherapy with ddAC-THP, left mastectomy with lymph node biopsy, received 4 or 6 cycles of herceptin  and Perjeta  due to drop in EF on letrozole , VT, history of PVC ablation on 03/21/2020, under the care of Dr Heywood and team.       It's a pleasure to meet up with Yolanda Bowers for a follow up appointment with me concerning her cancer Survivorship as she is doing pretty well overall. She shares with me that she just arrived back in the [place]  from travel to [place] and [place].    She states she was there with her son visiting her friend [place] traveling the countryside. She shares  with me some of the challenges that they had during this trip leading to the realization that she really needs to start focusing on her self-care.    She tears with me that she was super busy accommodating as she was "running in circles, sweating "care giving and meeting the needs of others. It took a little bit of a toll on her so she is really starting to focus on setting boundaries with others at this point.    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    She shares with me that she was very active on her trip, but she has some ongoing low back pain that she's going to get that addressed soon. She feels like she's not quite as good as shape as she was before so she has already signed up for some classes to get going again.     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     She also shares with me a story of her friend who died of pancreatic cancer, who emphasized the importance to her to consider her retirement plan and starting to live life to its fullest  because nothing's guaranteed.     She states that she's grateful for her therapy  as she's really come along way and that she's like getting into the point of acceptance and moving on and just letting things go and that's a big adjustment for her.     As we go through her care plan, she still needs to get her colonoscopy done, due for Derm appointment.  Also, she was due for her dexascan in May and also a follow up with Dr. Heywood and her providers in August so she's going to go ahead and schedule those appointments.    I am really excited for her because I think she's really made progress and identifying what serves her moving forward. She also  has really taken the heart how she needs to pace herself, especially during the school year so she's going to plan on a taking leave here and there so that she can recover and focus on her self care.     This is fantastic!     See previous notes.        Past Oncologic History  Oncology History Overview Note   10/2018: [name]   11/24/2018: Bilateral mammogram revealed a negative right breast. To the left breast UOQ, there was an 8mm mass and a 2.4 cm mass laterally, 17 cm from the nipple. Targeted left breast ultrasound revealed at 3 o'clock 8 cm from the nipple, a 21x13x12mm mass and an enlarged axillary lymph node measuring 12x11x31mm. There were multiple enlarged lymph nodes  11/27/2018: US -guided biopsy of the breast mass revealed invasive ductal carcinoma, ER/PR+, HER2+, grade 3. There was angiolymphatic invasion seen on biopsy. Biopsy of axillary lymph node positive for carcinoma.   12/08/2018: Bilateral breast MRI revealed right breast at 3 o'clock, 2.7cm from the nipple, a 10 x 6 x72mm mass and at 12 o'clock, 11cm from the nipple, a spiculated 16 x 4 x 13mm mass. Ultrasound recommended.  To the left breast, mass consistent with biopsy proved carcinoma measuring 24 x 20 x 24mm. There was also diffuse NME involving mid-outer breast extending over 110x45x71mm. Within the NME, there was a small spiculated mass at 12 o'clock measuring 12mm. Multiple level 1,2 and 3 lymph nodes seen, in addition to at least 3 internal mammary nodes.   12/14/2018: PET CT without evidence of distant metastasis. There are 4mm lung nodules bilaterally.   12/29/2018: Started on neoadjuvant chemotherapy with ddAC x4 cycles.  03/09/2019: Started on neoadjuvant chemotherapy with Taxol , Herceptin , and Perjeta .  06/25/2019: Underwent left mastectomy and SLN biopsy with Dr. Alm Gainer. Pathology revealed a single focus of angiolymphatic involvement by carcinoma (< 0.1cm). Negative surgical  margins. 0 of 5 lymph nodes positive for carcinoma. ypT0 ypN0.   09/21/2019: Completed XRT.  Started on letrozole  2.5mg  PO daily.     01/29/2020: Given drop in LVEF, last Herceptin /Perjeta  infusion was discontinued. Letrozole  continued.    Negative genetic panel testing, 11/2018.     Malignant neoplasm of upper-outer quadrant of left breast in female, estrogen receptor positive (HCC)   12/04/2018 Hereditary Counseling/Testing    Genetic counseling at [facility] 29 gene panel - negative for mutations     12/20/2018 Surgery    IR placement of right chest wall port at [facility]  IR port removal at [facility] 10/23/2020     12/29/2018 - 01/12/2020 Systemic Therapy    Neo-adjuvant chemotherapy at [facility]  Hannah Linden,MD  5/8 -  02/17/2019  dose dense AC x 4 cycles  Doxorubicin    total dose 240 mg/m2  Cyclophosphamide   Filgrastim     03/09/2019 - 01/12/2020  THP  Paclitaxel  (Taxol ) x 12 weeks, completed 06/01/2019  pertuzumab  (Perjeta ) 840 mg in sodium chloride  0.9 % 250 mL IVPB, Intravenous, 4 of 6 cycles  trastuzumab  (Herceptin ) 593 mg in sodium chloride  0.9 % 250 mL IVPB, Intravenous, 4 of 6 cycles  Herceptin  and Perjeta  discontinued after 01/12/2020 dose due to drop in LVEF/GLS     06/25/2019 Surgery    Left total mastectomy, left axillary sentinel lymph node biopsy - Alm Gainer, MD  [facility]  Right breast reduction - Elvie Finch, MD  Pathology:  No residual tumor - complete pathologic response to neo-adjuvant chemotherapy  Single focus angiolymphatic involvement <0.1 cm.  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   Her children each has a 50% chance to be a CPT2 carrier, and if one is AND his future partner is also a carrier, then their children have a 25% risk each to inherit the disease CPT2 deficiency. Her children can undergo carrier testing before having children since this does not affect their own health but does have reproductive implications.        Background Information   Significant Past Medical History      Past Medical History:   Diagnosis Date    Palpitations, VT 02/2020    Allergic rhinitis due to allergen      Anemia      Anxiety      Depression      Burn      Hx of CIN1 s/p Colpo 03/2015    Congestive heart failure (HCC)      Headache      Left arm lymphedema        Past Surgical History       Past Surgical History:   Procedure Laterality Date    Cardiac ablation   03/21/2020    Tonsillectomy   05/2009      Family History Cancer-related family history includes:  Paternal aunt with Ovarian cancer age 19  Paternal great aunts x 3 with colon cancer ages 71 - 28  Maternal grandfather with bone cancer age 92   Tobacco Use Social History          Tobacco Use   Smoking Status Never   Smokeless Tobacco Never      Alcohol Use Social History           Substance and Sexual Activity   Alcohol Use Not Currently     Comment: occasional wine , 2 glasses a month           Family History  family history includes Alcohol/Drug in  her maternal grandfather; Diabetes in her mother; Hearing Loss in her father and mother; Obesity in her maternal grandfather and mother; Ovarian Cancer (age of onset: 14) in her paternal aunt; Skin Cancer in her father and maternal grandfather.    Social History  Lives in [place] with her kids. Doing through a divorce. Pottery artist   loves travel         Current  Outpatient Medications:     acetaminophen  500 MG tablet, Take 1-2 tablets (500-1,000 mg) by mouth as needed. Take 1-2 tabs by mouth once daily as needed for pain., Disp: , Rfl:     aspirin 81 MG EC tablet, Take 1 tablet (81 mg) by mouth., Disp: , Rfl:     cetirizine  10 MG tablet, Take by mouth., Disp: , Rfl:     cholecalciferol 50 mcg (2,000 unit) capsule, Take 5,000 units by mouth daily., Disp: , Rfl:     cyanocobalamin (Vitamin B-12) 500 MCG tablet, Take 1 tablet (500 mcg) by mouth daily., Disp: , Rfl:     famotidine  20 MG tablet, Take 1 tablet (20 mg) by mouth as needed., Disp: , Rfl:     letrozole  2.5 MG tablet, Take 1 tablet (2.5 mg) by mouth daily, Disp: 90 tablet, Rfl: 3    methocarbamol  500 MG tablet, Take 1 tablet (500 mg) by mouth at bedtime as needed for muscle spasms., Disp: 30 tablet, Rfl: 2    metoprolol  succinate ER 25 MG 24 hr tablet, Take 1 tablet (25 mg) by mouth 2 times a day. Do not chew or crush., Disp: 180 tablet, Rfl: 3    valsartan  40 MG tablet, Take 1 tablet (40 mg) by mouth 2 times daily, Disp: 180 tablet, Rfl: 2    venlafaxine  ER 75 MG 24 hr capsule, Take 1 capsule (75 mg) by mouth 2 times a day. Per patient., Disp: , Rfl:     Review of patient's allergies indicates:  Allergies   Allergen Reactions    Oxycodone  Skin: Itching    Codeine HP:[id number]    Lisinopril  Resp: Cough    Paclitaxel  Skin: Hives    Walnut Skin: Rash       Review Of Symptoms    As per Interval history, all other systems discussed negative.       Physical Exam    ECOG: (0) Fully active, able to carry on all predisease performance without restriction    Constitutional   General appearance, behavior, and attitude:   normal appearance, behavior, NAD    MME  SPEECH: grossly normal and responds to my cues  PERCEPTUAL disturbances: NO  THOUGHT process and content: normal: logical, coherent, goal-directed.  JUDGEMENT and insight: grossly normal judgment and insight  SENSORIUM and cognition: grossly normal  MOOD and  affect: Normal, congruent, good eye contact.       Laboratory Data Reviewed   Laboratory Results: reviewed as available in Epic     ASSESSMENT and RECOMMENDATIONS  Yolanda Bowers is a 53 year old with history of  of high-grade infiltrating ductal carcinoma of the left breast (ER+/PR+, FISH +) with angiolymphatic invasion, history of neoadjuvant chemotherapy with ddAC-THP, left mastectomy with lymph node biopsy, received 4 or 6 cycles of herceptin  and Perjeta  due to drop in EF on letrozole ,  Also  VT, history of PVC ablation on 03/21/2020     Cont all recommendations of her oncology team and specialists       Reinforced self care!!!   Pt is making huge  progress in that regard as proud of her.    Will get her follow up scheduled with her oncology team    Schedule colonoscopy and check on pap     Also due for skin eval       Cardiology follow up is pending  as well.      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              Yolanda Bowers risk of secondary skin cancers due to radiation therapy. The area of prior radiaiton is at risk for skin cancers.  We recommend self-exam by the patient on a monthly basis,. Recommend PCP perform or refer to  Dermatology for exam yearly or per result .  Pt to request a referral                 At risk of renal compromise:   Yolanda Bowers  is at risk of renal compromise due to prior chemotherapy exposure-             Cyclophosphamide   Renal function, UA recommended yearly         At risk of bone  mineral density loss:    being followed by her oncology team               Due to radiation exposure   Due for Dexa as will reach out to providers to schedule               Pt is currently on an AI                Healthcare Maintenance:   Alida should be seen by a PCP at least yearly.  Lolitha  should have a yearly CBC, CMP, Fasting lipids, glucose and HgbA1c should be checked yearly or as indicated by previous results.   Vitamin D should be checked at baseline. with therapeutic goal of 50.     Additionally, Yarelis  should be seen by:     Optometrist/Ophthalmologist yearly      Dentist every 6 months.        Future visits:   Return to Primary Care Provider as needed for annual physicals, acute care visits and vaccines.  Continue all follow up care with your entire CARE team as recommended.  Your next visit to the Survivor Program should be: 4 months   In the interim, please do not hesitate to contact us  with any questions: [phone], [email or web address]        I spent a total of 45 minutes for the patient's care on the date of the service.

## 2024-04-13 ENCOUNTER — Telehealth (HOSPITAL_BASED_OUTPATIENT_CLINIC_OR_DEPARTMENT_OTHER)

## 2024-04-13 ENCOUNTER — Encounter (HOSPITAL_BASED_OUTPATIENT_CLINIC_OR_DEPARTMENT_OTHER): Payer: Self-pay | Admitting: Medical Oncology

## 2024-04-13 DIAGNOSIS — Z170 Estrogen receptor positive status [ER+]: Secondary | ICD-10-CM

## 2024-04-13 NOTE — Progress Notes (Signed)
 Yolanda Bowers    ID: Yolanda Bowers is a 53 year old female with history of cT3N3 left breast invasive ductal carcinoma, ER/PR/HER2+.    Reason for referral to Social Work: Supportive Counseling   Encounter method: Telehealth: I conducted this encounter via secure, live, face-to-face video conference with the patient. I reviewed the risks and benefits of telemedicine as pertinent to this visit and the patient agreed to proceed. Patient was located at home. Provider was located at off-site location.  Encounter type: Return Social Work Visit  Present at the visit: Patient    ASSESSMENT:    Screening scores    PHQ-9 depression screening history:  PHQ9 Total Scores         10/12/2023 10/31/2023 11/16/2023 12/18/2023 01/06/2024 01/23/2024 04/13/2024    PHQ9 Total Score: 13  12  9  10  13 7  8            Thoughts that you would be better off dead or of hurting yourself in some way: (Patient-Rptd) 0- Not at all    PHQ-9 score indicates: 10 - 14: Moderate depression  PHQ-9 Flowsheet    GAD-7 anxiety screening history:  GAD7 Total Scores         10/12/2023 10/31/2023 11/16/2023 12/18/2023 01/06/2024 01/23/2024 04/13/2024    Total: 7  5  6  7  10 5  9            GAD-7 score indicates: 5 - 9: Mild anxiety  GAD-7 Flowsheet    Suicide assessment {Tip  If patient scores 1+ on the 9th question of PHQ-9, please proceed to complete the C-SSRS Flowsheet and Safety Assessment. If patient scores moderate+, then complete Safety Plan as well :999}  Suicide assessment not indicated today.  {Tip  Suicide Assessment can be entered via SmartPhrase .FHCCSWSUICIDE:999}    Mental status exam  Appearance: Good grooming and hygiene  Behavior: Appropriately interactive w/ others  Speech: Normal rate, Normal rhythm  Affect: Appropriate  Mood: Patient reported overwhelm  Thought Form/Content: Coherent, Concrete  Attention/Concentration: Good  Memory: Normal  Judgement/Insight: Intact    Psychosocial background {Tip  Please do not  delete sections below. If not addressed, please write Not reviewed today. This will allow for easy completion at future visits. :999}  Mental health history (diagnoses, in-pt/out-pt treatment, psychotropic medications, SI and/or trauma history, family MH history): Patient reported diagnosis of Major Depressive Disorder. Patient prescribed Venlafaxine  75 mg daily. Patient reported attending counseling at [facility] every couple weeks. Patient endorsed the use of trauma informed care and cognitive behavioral therapy modalities. Patient reported that she previously saw Dr. Carlie with [facility]. Patient reported experiencing symptoms of anxiety, low energy, and fatigue. Patient shared that she is beginning to track her sleep. Patient shared that some of the anxiety she is experiencing is due to fear of cancer reoccurrence or diagnosis of other health challenges.    Substance use history (alcohol, marijuana, recreational drugs): Per chart review, patient does not endorse use of tobacco or drug use. Patient does not currently endorse alcohol use. Patient endorses occasional wine, about 2 glasses per month.    Previous steroid use and adverse reaction: Not reviewed today    Coping skills and strengths: just keep going, hide and mask, walks, exercise, being creative, travel, paint, print making, [employer] activities    Barriers to activation (sleep, pain, exercise, and fatigue concerns): Anxiety, stress     Brief social history (childhood, family  situation, current home/living situation, community supports, education, work, Hotel manager active duty/veteran status, cultural factors):   Patient lives in [place] with her Sinclair, 33 yo son, Dewayne 75 yo son, and son's girlfriend, Maisy. Patient shared that she her partner, Bing, lives in [place]. The couple have been together for a couple years. Patient currently in divorce process with her ex husband. Patient reported that they have not been living together  for over a year and in the mediation stage. Patient shared there are safety precautions in place to protect both herself and kids, including restraining order. Patient has a sister, brother, and sister-in law living in [place]. Patient works full-time as a Runner, broadcasting/film/video in the [employer] 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 Patient reported that she is aware of PFML and inquired about possibility last week Patient aware of pay cut if she does continue with PFML. 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 Patient finds joy in creativity, travelling, and spending time with her close loved ones.     Religious/spiritual background and affiliation: Not reviewed today    Treatment-related logistics/concerns  Caregiver plan for treatment: Not reviewed today    Lodging/housing plan for treatment: Lives in [place]    Travel/lodging benefit for treatment: Not reviewed today    Practical/logistical/financial concerns: To discuss further at next visit    Potential barriers to accessing care: Not reviewed today    Treatment preferences (communication, goals, care preferences): Communication, goals    Advance care planning: Not reviewed today    Release of Information (ROI): Not reviewed today  --  ENCOUNTER SUMMARY:  Flynn Yolanda Bowers is a 53 year old female with history of cT3N3 left breast invasive ductal carcinoma, ER/PR/HER2+.  Patient's psychometric responses indicate potential 10 - 14: Moderate depression and 5 - 9: Mild anxiety. Patient denies SI and HI, and reports feeling safe.    8/22:  Work next week 8/26  [place] this summer  Divorce April   Taking care of self, ways to intentioanl move into this space  I need soemthing different escape  [employer] classes - yoga, strength, dance, breast friends   Finding your cratiave spaces, places, people   Joys of waterful, glimmer of joy  Beginning in October *    6/2: Patient  reported on Psychiatry consultation with Dr. Carlie. Patient shared on shifting medication, as she is adding an extra dose at night. Patient reported since making the shift, she has noticed changes in productivity, motivation, and not waking up in the middle of the night drenched in sweat. Patient reported on experiencing severe hot flashes, estimating about 30-40 per day. Patient expressed ways in which she is setting boundaries and making time for herself. Patient shared on 3--45 min walks in the garden, organizing her household, and intentional time for creativity. Patient shared on, saving seeds for her future self. Patient and MSW explored themes of blooming and setting herself up for the future. Patient to visit [place] from July to August.    MSW sent patient email re: [facility] - 6/11.     4/28: Patient reported on recent trip to [place] with [name]. Patient expressed gratitude for time to renew and away from responsibilities. Patient shared that they walked on the beach, ate with intention, and wandered.     Patient shared goals of walking 30 minutes per day for body movement. Discussed setting timers for activity scheduling and  daily goals. Discussed Behavioral Activation strategies.Patient expressed feelings of isolation and disconnection. Patient shared that she is yearning for connection. MSW and patient brainstormed possibilities - support groups, creative spaces.     Patient engaged with Neuro Psych. Patient reported hopes of discussion of Effexor  side effects and depression symptoms.    Resources to Send:  - Support Groups - cancer lifeline, cancer pathways, parent group  - Art Therapy  - BA ideas - set timers    3/27: MSW conducted telehealth visit. Patient no showed. MSW to reach out to patient and reschedule for a later time.    3/11: Patient shared on feelings of exhaustion, as she is continuing to cope with mental and physical stressors. Patient reported on PFMLA and potential  options for work schedule. Patient shared that she is focusing on taking care of herself, in ways like I am going to reclaim my life and regain my time/interests. Patient shared that she is taking a few classes a week through [employer] and a swim classes for physical activity.     2/20: MSW conducted initial assessment with patient. MSW shared that she has experienced fatigue, hot flashes, and nausea since chemo treatment. Patient currently on Letrozole  since 2021. Patient presented to [facility] with new breast lump in December of 2024. Patient working with med team to assess. Patient shared that there are external factors that have been causing increased anxiety and depression symptoms, as she is going through divorce proceedings. Patient expressed that she is focused on what is best for her and her sons. MSW provided space for patient to name and externally process emotions. Patient sees counselor in the community every other week. Patient expressed how she is engaging in activities that bring joy and ease during this time, including creative projects and quality time with her sons. Patient to meet with Cardio in a week.    Referrals:  [facility]  --  INTERVENTION:  MSW introduced self and role as resource-linker, advocate, and supportive counselor, Provided contact information, and Provided counseling support    Themes explored:  Adjustment to diagnosis/treatment, Logistical/practical stressors, Mood concerns/symptoms, and Relationships    Therapeutic interventions/approaches used:  Relaxation techniques, Self-compassion, and Supportive counseling    --  PLAN: {Tip  For F/U SW visits please schedule this with patient when possible, and place order for time discussed, indicating to [facility] no confirmation necessary. :999}  MSW appreciated the opportunity to connect with this patient. Patient is aware of Social Work role and services. Return visit planned and order placed for: 8/22    Time Spent -  Direct Care: 60 minutes  Time Spent - Indirect Care: 20 minutes    Adolph Guillaume  [phone]

## 2024-04-25 NOTE — Progress Notes (Signed)
 Use of Ambient Listening:   Was ambient listening technology used during this visit and was verbal consent for recording obtained? Yes, used in visit. Consent was obtained.       [facility]  BREAST ONCOLOGY CLINIC NOTE    IDENTIFICATION  Yolanda Bowers is a 53 year old female with history of cT3N3 left breast invasive ductal carcinoma, ER/PR/HER2+, s/p neoadjuvant chemotherapy with ddAC-THP, left mastectomy and SLN biopsy, and adjuvant radiation therapy. She had a complete pathologic response at the time of surgery. Presents to clinic today in follow up.    ONCOLOGIC HISTORY     Oncology History Overview Note   10/2018: [name]   11/24/2018: Bilateral mammogram revealed a negative right breast. To the left breast UOQ, there was an 8mm mass and a 2.4 cm mass laterally, 17 cm from the nipple. Targeted left breast ultrasound revealed at 3 o'clock 8 cm from the nipple, a 21x13x43mm mass and an enlarged axillary lymph node measuring 12x11x53mm. There were multiple enlarged lymph nodes  11/27/2018: US -guided biopsy of the breast mass revealed invasive ductal carcinoma, ER/PR+, HER2+, grade 3. There was angiolymphatic invasion seen on biopsy. Biopsy of axillary lymph node positive for carcinoma.   12/08/2018: Bilateral breast MRI revealed right breast at 3 o'clock, 2.7cm from the nipple, a 10 x 6 x60mm mass and at 12 o'clock, 11cm from the nipple, a spiculated 16 x 4 x 13mm mass. Ultrasound recommended.  To the left breast, mass consistent with biopsy proved carcinoma measuring 24 x 20 x 24mm. There was also diffuse NME involving mid-outer breast extending over 110x45x66mm. Within the NME, there was a small spiculated mass at 12 o'clock measuring 12mm. Multiple level 1,2 and 3 lymph nodes seen, in addition to at least 3 internal mammary nodes.   12/14/2018: PET CT without evidence of distant metastasis. There are 4mm lung nodules bilaterally.    12/29/2018: Started on neoadjuvant chemotherapy with ddAC x4 cycles.  03/09/2019: Started on neoadjuvant chemotherapy with Taxol , Herceptin , and Perjeta .  06/25/2019: Underwent left mastectomy and SLN biopsy with Dr. Alm Gainer. Pathology revealed a single focus of angiolymphatic involvement by carcinoma (< 0.1cm). Negative surgical margins. 0 of 5 lymph nodes positive for carcinoma. ypT0 ypN0.   09/21/2019: Completed XRT.  Started on letrozole  2.5mg  PO daily.     01/29/2020: Given drop in LVEF, last Herceptin /Perjeta  infusion was discontinued. Letrozole  continued.    Negative genetic panel testing, 11/2018.     Malignant neoplasm of upper-outer quadrant of left breast in female, estrogen receptor positive (HCC)   11/20/2018 Diagnositic Procedure    Presented to primary care with left breast pain/mass  11/24/2018 - diagnostic mammogram/ultrasound 21 x 13 x 22 mm mass upper outer quadrant, multiple enlarged lymph nodes  11/27/2018: US -guided biopsy of the breast mass revealed invasive ductal carcinoma, ER/PR+, HER2+, grade 3. There was angiolymphatic invasion seen on biopsy. Biopsy of axillary lymph node positive for carcinoma.   12/08/2018: Bilateral breast MRI revealed right breast at 3 o'clock, 2.7cm from the nipple, a 10 x 6 x26mm mass and at 12 o'clock, 11cm from the nipple, a spiculated 16 x 4 x 13mm mass. Ultrasound recommended.  To the left breast, mass consistent with biopsy proved carcinoma measuring 24 x 20 x 24mm. There was also diffuse NME involving mid-outer breast extending over 110x45x49mm. Within the NME, there was a small spiculated mass at 12 o'clock measuring 12mm. Multiple level 1,2 and 3 lymph  nodes seen, in addition to at least 3 internal mammary nodes.   12/14/2018 - PET - no distant metastasis  Clinical Stage IIIC  cT3 cN3b cM0     11/27/2018 Initial Diagnosis    Malignant neoplasm of upper-outer quadrant of left breast in female, estrogen receptor positive (HCC)  Invasive ductal carcinoma, grade 3  Stage  IIIC     12/04/2018 Hereditary Counseling/Testing    Genetic counseling at [facility] 29 gene panel - negative for mutations     12/20/2018 Surgery    IR placement of right chest wall port at [facility]  IR port removal at [facility] 10/23/2020     12/29/2018 - 01/12/2020 Systemic Therapy    Neo-adjuvant chemotherapy at [facility],[place]  5/8 - 02/17/2019  dose dense AC x 4 cycles  Doxorubicin    total dose 240 mg/m2  Cyclophosphamide   Filgrastim     03/09/2019 - 01/12/2020  THP  Paclitaxel  (Taxol ) x 12 weeks, completed 06/01/2019  pertuzumab  (Perjeta ) 840 mg in sodium chloride  0.9 % 250 mL IVPB, Intravenous, 4 of 6 cycles  trastuzumab  (Herceptin ) 593 mg in sodium chloride  0.9 % 250 mL IVPB, Intravenous, 4 of 6 cycles  Herceptin  and Perjeta  discontinued after 01/12/2020 dose due to drop in LVEF/GLS     06/25/2019 Surgery    Left total mastectomy, left axillary sentinel lymph node biopsy - Alm Gainer, MD  [facility]  Right breast reduction - Elvie Finch, MD  Pathology:  No residual tumor - complete pathologic response to neo-adjuvant chemotherapy  Single focus angiolymphatic involvement <0.1 cm.  Biopsy site changes and treatment effect present  Sentinel lymph nodes:  0 positive of 5, 2 nodes with treatment effect  Right breast tissue - benign  ypT0 ypN0     08/15/2019 - 09/21/2019 Radiation Therapy    Post mastectomy radiation - Detra Flatten, MD    Left chest wall, left supraclavicular fossa, left axilla, left internal mammary chain - 5000 cGy in 25 fractions     01/2020 -  Systemic Therapy    Start Letrozole      09/01/2020 Hereditary Counseling/Testing    Genetic Counseling at [facility]  100 gene panel  - Pathogenic variant in CPT2 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   Her children each has a 50% chance to be a CPT2 carrier, and if one is AND his future partner is also a carrier, then their children  have a 25% risk each to inherit the disease CPT2 deficiency. Her children can undergo carrier testing before having children since this does not affect their own health but does have reproductive implications.           INTERVAL HISTORY  History of Present Illness  Yolanda Bowers is a 53 year old female with left breast invasive ductal carcinoma (ER/PR+, HER2+, grade 3), status post neoadjuvant chemotherapy, mastectomy, and adjuvant radiation, currently on letrozole , who presents for oncology follow-up with persistent fatigue, chronic low back pain, abdominal bloating, and ongoing mild anemia and leukopenia.    Fatigue is persistent and profound, with no significant improvement despite a recent increase in venlafaxine  dose. She requires naps after work, limiting exercise and activities beyond work. She has required two medical leaves in the past year due to fatigue and is concerned about sustaining her work routine. Despite engaging in hiking, swimming, and exercise classes over the summer, she  is unable to increase activity due to exhaustion. She remains active with daily walking, stair climbing, lawn mowing, and gardening, but feels too fatigued to do more. No fevers, chills, night sweats, shortness of breath, or cough.    Since late August, she has developed abdominal bloating and crampy discomfort, described as similar to menstrual cramps, with a sensation of internal fullness and increased abdominal girth, distinct from weight gain. She has intermittent constipation, managed with Miralax  with some stool softening but no significant improvement in bloating or cramping. She notes increased gas and occasional urgency for bowel and bladder movements. Currently having regular bowel movements and has not used Miralax  for two days. Colonoscopy is pending; transvaginal ultrasound is scheduled for late September.    Chronic lower back pain has been present throughout the summer, described as midline lower back  soreness and discomfort, sometimes excruciating and worse upon waking. Pain is located in the midline lower back, with a prior history of similar pain and a history of arthritis. Over-the-counter analgesics provide minimal relief. Pain is not movement-related and does not radiate. XR negative for bony lesion. Physical therapy referral is in place but not yet started.    She has persistently low red blood cell count and hematocrit, with a history of transient anemia during chemotherapy. White blood cell and lymphocyte counts have been low but are improving. No abnormal bleeding or bruising. She is due for her colonoscopy and will get this scheduled.     ECOG Performance Status: 1 (Restricted in physically strenuous activity but ambulatory and able to carry out work of a light or sedentary nature, e.g., light house work, office work): The patient is able to work (teaching at school), is ambulatory, and performs self-care, but reports significant fatigue, requiring naps after work, and is unable to exercise as much as desired. She is still able to do activities like hiking, swimming, gardening, mowing the lawn, and taking stairs, but strenuous activity is limited by fatigue. This fits ECOG 1: restricted in physically strenuous activity but ambulatory and able to carry out light or sedentary work.      ECOG Status: (0) Fully active, able to carry on all predisease performance without restriction     REVIEW OF SYSTEMS  A complete review of systems was conducted and is negative in detail, except as noted in the interval history.    PAST MEDICAL HISTORY  Patient Active Problem List   Diagnosis    Malignant neoplasm of upper-outer quadrant of left breast in female, estrogen receptor positive (HCC)    CIN I (cervical intraepithelial neoplasia I)    Chemotherapy induced cardiomyopathy (HCC)    PVC's (premature ventricular contractions)    Anxiety    Carrier of CPT2 deficiency    History of left mastectomy    Encounter to  discuss breast reconstruction    History of radiation therapy        PAST SURGICAL HISTORY  Past Surgical History:   Procedure Laterality Date    BREAST BIOPSY Right 11/2018    2 core bx      BREAST RECONSTRUCTION Right 06/25/2019    reduction - Dr. Sherill    PR MASTECTOMY SIMPLE COMPLETE Left 06/25/2019    Had radiation        ALLERGIES  Review of patient's allergies indicates:  Allergies   Allergen Reactions    Oxycodone  Skin: Itching    Codeine HP:[id number]    Lisinopril  Resp: Cough    Paclitaxel  Skin: Hives  [place] Skin: Rash        MEDICATIONS    Current Outpatient Medications:     acetaminophen  500 MG tablet, Take 1-2 tablets (500-1,000 mg) by mouth as needed. Take 1-2 tabs by mouth once daily as needed for pain., Disp: , Rfl:     aspirin 81 MG EC tablet, Take 1 tablet (81 mg) by mouth., Disp: , Rfl:     cetirizine  10 MG tablet, Take by mouth., Disp: , Rfl:     cholecalciferol 50 mcg (2,000 unit) capsule, Take 5,000 units by mouth daily., Disp: , Rfl:     famotidine  20 MG tablet, Take 1 tablet (20 mg) by mouth as needed., Disp: , Rfl:     letrozole  2.5 MG tablet, Take 1 tablet (2.5 mg) by mouth daily, Disp: 90 tablet, Rfl: 3    methocarbamol  500 MG tablet, Take 1 tablet (500 mg) by mouth at bedtime as needed for muscle spasms., Disp: 30 tablet, Rfl: 2    metoprolol  succinate ER 25 MG 24 hr tablet, Take 1 tablet (25 mg) by mouth 2 times a day. Do not chew or crush., Disp: 180 tablet, Rfl: 3    valsartan  40 MG tablet, Take 1 tablet (40 mg) by mouth 2 times daily, Disp: 180 tablet, Rfl: 2    venlafaxine  ER 75 MG 24 hr capsule, Take 1 capsule (75 mg) by mouth 2 times a day. Per patient., Disp: , Rfl:      SOCIAL HISTORY:   Social History     Tobacco Use    Smoking status: Never    Smokeless tobacco: Never   Substance Use Topics    Alcohol use: Not Currently     Comment: occasional wine , 2 glasses a month    Drug use: Not Currently      Two children, one in college --> back home with  girlfriend    FAMILY HISTORY:    Cancer-related family history includes Ovarian Cancer (age of onset: 88) in her paternal aunt; Skin Cancer in her father and maternal grandfather.       PHYSICAL EXAM  VITAL SIGNS: BP 108/60   Pulse (!) 59   Temp 36.8 C (Oral)   Resp 16   Wt 78.4 kg (172 lb 13.5 oz)   SpO2 98%   BMI 29.15 kg/m       GENERAL: Well appearing, well developed female in no acute distress.  HEENT: Normocephalic, atraumatic. Sclerae anicteric.   NECK: Neck is supple without any appreciable lymphadenopathy or thyromegaly.  LUNGS: Respirations regular and unlabored. Clear to auscultation bilaterally.  CARDIAC: Regular rate and rhythm. No murmurs, gallops, or rubs.   BREASTS: S/p left mastectomy without reconstruction. No concerning nodules or skin changes. Right breast without palpable masses, skin changes, or nipple changes. No axillary lymphadenopathy appreciated bilaterally.   EXTREMITIES: No edema, clubbing, or cyanosis.  SKIN: No visible rash, bruising, or petechiae.  MSK: Without muscle or joint deformities. No spinal or hip tenderness to palpation.   NEUROLOGICAL: Alert and oriented. Mental status appropriate.   NUTRITIONAL STATUS: Adequate.     LABORATORY  Results for orders placed or performed in visit on 04/26/24   Lab Add On Order, Non-Micro   Result Value Ref Range    Lab Test Requested b12, folate, iron panel, ferritin     Specimen Type/Description Blood     Sample To Use Most recent     Test Request Status Order Processed  ASSESSMENT/PLAN  Yolanda Bowers is a 53 year old female with history of cT3N3 left breast invasive ductal carcinoma, ER/PR/HER2+, s/p neoadjuvant chemotherapy with ddAC-THP, left mastectomy and SLN biopsy, and adjuvant radiation therapy. She had a complete pathologic response at the time of surgery. She is likely cured of breast cancer, but has CHF, and stressors, on top of arduous treatment    Assessment & Plan  History of malignant neoplasm of  upper-outer quadrant of left breast, female  History of left breast invasive ductal carcinoma. No new breast or chest wall symptoms; mild chest wall discomfort likely musculoskeletal. No masses or lymphadenopathy on exam. Ongoing surveillance and management of long-term effects in place.  - Performed breast and chest wall exam; no concerning findings.  - Ordered right breast mammogram for January.    Chronic fatigue  Fatigue is persistent and significantly impairs daily functioning. Etiology is multifactorial. Fatigue is recognized as a long-term effect of cancer treatment and is addressed in survivorship care. Patient does have mild anemia which may be contributing.   - Recommended cancer rehabilitation or gradual exercise program, potentially via physical therapy.  - Encouraged continued light activity as tolerated.  - Discussed potential contribution of venlafaxine  and letrozole  to fatigue; advised follow-up with primary care for medication management, perhaps trial of.  - Reinforced importance of ongoing communication with primary care and oncology regarding fatigue.    Anemia, unspecified  Mild, persistent anemia with low hematocrit and red blood cell count, previously transient during chemotherapy. Prior iron, B12, and folate levels were normal, but repeat evaluation is warranted. Anemia is a recognized long-term effect of chemotherapy; ongoing blood loss or nutritional deficiency must be excluded.  - Ordered repeat iron, B12, and folate studies.  - Recommended colonoscopy to evaluate for gastrointestinal blood loss.  - Consider econsult to heme    Chronic low back pain with spinal arthritis  Chronic, sometimes severe, midline low back pain with spinal arthritis. Pain is refractory to acetaminophen  and ibuprofen . Given oncologic history and high-risk features at diagnosis, further imaging is indicated to exclude metastatic disease or other pathology.  - Ordered CT abdomen and pelvis to evaluate for bony  lesions and intra-abdominal pathology.  - Encouraged physical therapy referral for back pain management.  - Discussed possible bone scan if further evaluation is required.    Constipation with bloating and abdominal discomfort under evaluation  Intermittent constipation, bloating, and lower abdominal cramping, partially responsive to Miralax . Ongoing abdominal discomfort and urgency. Imaging demonstrated constipation. Awaiting transvaginal ultrasound and colonoscopy. Further evaluation is warranted given oncologic history and need to exclude secondary causes.  - Advised continuation of gentle bowel regimen with Miralax  as needed.  - Encouraged completion of scheduled transvaginal ultrasound.  - Recommended colonoscopy as planned.  - Ordered CT abdomen and pelvis to further evaluate abdominal symptoms.    Leukopenia, unspecified  Mild, persistent leukopenia since chemotherapy, with current labs showing improvement but still below normal. Neutrophil count remains stable. Leukopenia is a recognized long-term effect of chemotherapy and is monitored in survivorship care.  - Advised continued monitoring of white blood cell count with routine labs.      Orders Placed This Encounter    CT Abdomen And Pelvis w Contrast    Mammography Screening w/Tomo Right    Lab Add On Order, Non-Micro      DISPOSITION  Patient to return to clinic in 6 months for follow up and surveillance, or sooner if needed. Patient encouraged to contact clinic in the interim  with any questions or concerns.

## 2024-04-26 ENCOUNTER — Ambulatory Visit (HOSPITAL_BASED_OUTPATIENT_CLINIC_OR_DEPARTMENT_OTHER): Admitting: Physician Assistant

## 2024-04-26 ENCOUNTER — Encounter (HOSPITAL_BASED_OUTPATIENT_CLINIC_OR_DEPARTMENT_OTHER): Payer: Self-pay

## 2024-04-26 ENCOUNTER — Other Ambulatory Visit (HOSPITAL_BASED_OUTPATIENT_CLINIC_OR_DEPARTMENT_OTHER): Payer: Self-pay

## 2024-04-26 ENCOUNTER — Ambulatory Visit
Admit: 2024-04-26 | Discharge: 2024-04-26 | Disposition: A | Discharge status: Home / Self Care | Source: Home / Self Care | Attending: Nuclear Medicine | Admitting: Nuclear Medicine

## 2024-04-26 ENCOUNTER — Ambulatory Visit (HOSPITAL_BASED_OUTPATIENT_CLINIC_OR_DEPARTMENT_OTHER)

## 2024-04-26 VITALS — BP 108/60 | HR 59 | Temp 98.2°F | Resp 16 | Wt 172.8 lb

## 2024-04-26 DIAGNOSIS — D649 Anemia, unspecified: Secondary | ICD-10-CM | POA: Insufficient documentation

## 2024-04-26 DIAGNOSIS — Z853 Personal history of malignant neoplasm of breast: Secondary | ICD-10-CM

## 2024-04-26 DIAGNOSIS — C50412 Malignant neoplasm of upper-outer quadrant of left female breast: Secondary | ICD-10-CM | POA: Insufficient documentation

## 2024-04-26 DIAGNOSIS — R140 Abdominal distension (gaseous): Secondary | ICD-10-CM | POA: Insufficient documentation

## 2024-04-26 DIAGNOSIS — Z170 Estrogen receptor positive status [ER+]: Secondary | ICD-10-CM

## 2024-04-26 DIAGNOSIS — Z1231 Encounter for screening mammogram for malignant neoplasm of breast: Secondary | ICD-10-CM | POA: Insufficient documentation

## 2024-04-26 DIAGNOSIS — D72819 Decreased white blood cell count, unspecified: Secondary | ICD-10-CM | POA: Insufficient documentation

## 2024-04-26 DIAGNOSIS — M479 Spondylosis, unspecified: Secondary | ICD-10-CM | POA: Insufficient documentation

## 2024-04-26 DIAGNOSIS — R5383 Other fatigue: Secondary | ICD-10-CM

## 2024-04-26 DIAGNOSIS — K5900 Constipation, unspecified: Secondary | ICD-10-CM | POA: Insufficient documentation

## 2024-04-26 DIAGNOSIS — M5459 Other low back pain: Secondary | ICD-10-CM | POA: Insufficient documentation

## 2024-04-26 DIAGNOSIS — Z1721 Progesterone receptor positive status: Secondary | ICD-10-CM | POA: Insufficient documentation

## 2024-04-26 DIAGNOSIS — Z1239 Encounter for other screening for malignant neoplasm of breast: Secondary | ICD-10-CM

## 2024-04-26 DIAGNOSIS — Z1732 Human epidermal growth factor receptor 2 negative status: Secondary | ICD-10-CM | POA: Insufficient documentation

## 2024-04-26 LAB — COMPREHENSIVE METABOLIC PANEL
ALT (GPT): 21 U/L (ref 7–33)
AST (GOT): 19 U/L (ref 9–38)
Albumin: 4.2 g/dL (ref 3.5–5.2)
Alkaline Phosphatase (Total): 41 U/L (ref 34–121)
Anion Gap: 4 (ref 4–12)
Bilirubin (Total): 0.4 mg/dL (ref 0.2–1.3)
Calcium: 9.5 mg/dL (ref 8.9–10.2)
Carbon Dioxide, Total: 33 meq/L — ABNORMAL HIGH (ref 22–32)
Chloride: 102 meq/L (ref 98–108)
Creatinine: 0.84 mg/dL (ref 0.38–1.02)
Glucose: 97 mg/dL (ref 62–125)
Potassium: 3.7 meq/L (ref 3.6–5.2)
Protein (Total): 6.7 g/dL (ref 6.0–8.2)
Sodium: 139 meq/L (ref 135–145)
Urea Nitrogen: 17 mg/dL (ref 8–21)
eGFR by CKD-EPI 2021: >60 mL/min/1.73_m2 (ref >59)

## 2024-04-26 LAB — IRON BINDING CAPACITY (W/IRON, TRANSFERRIN & TRANSF SAT)
Iron, SRM: 101 ug/dL (ref 31–171)
Total Iron Binding Capacity: 382 ug/dL (ref 270–535)
Transferrin Saturation: 26 % (ref 10–45)
Transferrin: 273 mg/dL (ref 192–382)

## 2024-04-26 LAB — CBC, DIFF
% Basophils: 0 %
% Eosinophils: 2 %
% Immature Granulocytes: 0 %
% Lymphocytes: 26 %
% Monocytes: 7 %
% Neutrophils: 65 %
% Nucleated RBC: 0 %
Absolute Eosinophil Count: 0.07 10*3/uL (ref 0.00–0.50)
Absolute Lymphocyte Count: 0.98 10*3/uL — ABNORMAL LOW (ref 1.00–4.80)
Basophils: 0.01 10*3/uL (ref 0.00–0.20)
Hematocrit: 34 % — ABNORMAL LOW (ref 36.0–45.0)
Hemoglobin: 11.7 g/dL (ref 11.5–15.5)
Immature Granulocytes: 0.01 10*3/uL (ref 0.00–0.05)
MCH: 30.5 pg (ref 27.3–33.6)
MCHC: 34.1 g/dL (ref 32.2–36.5)
MCV: 90 fL (ref 81–98)
Monocytes: 0.27 10*3/uL (ref 0.00–0.80)
Neutrophils: 2.42 10*3/uL (ref 1.80–7.00)
Nucleated RBC: 0 10*3/uL
Platelet Count: 183 10*3/uL (ref 150–400)
RBC: 3.83 10*6/uL (ref 3.80–5.00)
RDW-CV: 12.5 % (ref 11.0–14.5)
WBC: 3.76 10*3/uL — ABNORMAL LOW (ref 4.3–10.0)

## 2024-04-26 LAB — LAB ADD ON ORDER

## 2024-04-26 LAB — FOLATE & VITAMIN B12
Folate, SRM: 13.6 ng/mL (ref >5.8)
Vitamin B12 (Cobalamin): 394 pg/mL (ref 180–914)

## 2024-04-26 LAB — FERRITIN: Ferritin: 37 ng/mL (ref 10–180)

## 2024-04-27 ENCOUNTER — Encounter (HOSPITAL_BASED_OUTPATIENT_CLINIC_OR_DEPARTMENT_OTHER): Payer: Self-pay | Admitting: Physician Assistant

## 2024-04-27 ENCOUNTER — Encounter (HOSPITAL_BASED_OUTPATIENT_CLINIC_OR_DEPARTMENT_OTHER): Payer: Self-pay

## 2024-04-28 ENCOUNTER — Other Ambulatory Visit (HOSPITAL_BASED_OUTPATIENT_CLINIC_OR_DEPARTMENT_OTHER): Payer: Self-pay | Admitting: Cardiovascular Disease

## 2024-04-28 ENCOUNTER — Encounter (HOSPITAL_BASED_OUTPATIENT_CLINIC_OR_DEPARTMENT_OTHER): Payer: Self-pay | Admitting: Physician Assistant

## 2024-04-28 DIAGNOSIS — I429 Cardiomyopathy, unspecified: Secondary | ICD-10-CM

## 2024-04-30 NOTE — Addendum Note (Signed)
 Addended by: Milferd Ansell MARIE on: 04/30/2024 05:23 PM     Modules accepted: Orders

## 2024-05-01 MED ORDER — METOPROLOL SUCCINATE ER 25 MG OR TB24
EXTENDED_RELEASE_TABLET | ORAL | 3 refills | Status: AC
Start: 2024-05-01 — End: ?

## 2024-05-03 ENCOUNTER — Other Ambulatory Visit (HOSPITAL_BASED_OUTPATIENT_CLINIC_OR_DEPARTMENT_OTHER): Payer: Self-pay | Admitting: Hematology & Oncology

## 2024-05-03 DIAGNOSIS — D649 Anemia, unspecified: Secondary | ICD-10-CM
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
## 2024-05-04 ENCOUNTER — Other Ambulatory Visit (HOSPITAL_BASED_OUTPATIENT_CLINIC_OR_DEPARTMENT_OTHER): Payer: Self-pay | Admitting: Physician Assistant

## 2024-05-04 DIAGNOSIS — Z170 Estrogen receptor positive status [ER+]: Secondary | ICD-10-CM

## 2024-05-08 ENCOUNTER — Other Ambulatory Visit (HOSPITAL_BASED_OUTPATIENT_CLINIC_OR_DEPARTMENT_OTHER): Payer: Self-pay

## 2024-05-08 ENCOUNTER — Other Ambulatory Visit: Payer: Self-pay

## 2024-05-12 ENCOUNTER — Ambulatory Visit: Attending: Physician Assistant

## 2024-05-12 DIAGNOSIS — Z170 Estrogen receptor positive status [ER+]: Secondary | ICD-10-CM | POA: Insufficient documentation

## 2024-05-12 DIAGNOSIS — C50412 Malignant neoplasm of upper-outer quadrant of left female breast: Secondary | ICD-10-CM | POA: Insufficient documentation

## 2024-05-12 LAB — SED RATE: Erythrocyte Sedimentation Rate: 3 mm/h (ref 0–30)

## 2024-05-12 LAB — RETICULOCYTE COUNT
Absolute Reticulocyte Count: 68 10*9/L (ref 25–125)
RETIC HEMOGLOBIN EQUIVALENT: 32.5 pg (ref 28.0–38.0)
Reticulocyte Count, %: 1.7 % (ref 0.5–2.5)

## 2024-05-12 LAB — LACTATE DEHYDROGENASE: Lactate Dehydrogenase: 152 U/L (ref <210)

## 2024-05-12 LAB — SMEAR SCAN

## 2024-05-12 LAB — C_REACTIVE PROTEIN: C_Reactive Protein: 0.7 mg/L (ref 0.0–10.0)

## 2024-05-16 ENCOUNTER — Other Ambulatory Visit (HOSPITAL_BASED_OUTPATIENT_CLINIC_OR_DEPARTMENT_OTHER): Payer: Self-pay

## 2024-05-17 ENCOUNTER — Encounter (HOSPITAL_BASED_OUTPATIENT_CLINIC_OR_DEPARTMENT_OTHER): Payer: Self-pay

## 2024-05-22 ENCOUNTER — Ambulatory Visit
Admission: RE | Admit: 2024-05-22 | Discharge: 2024-05-22 | Disposition: A | Discharge status: Home / Self Care | Attending: Cardiovascular Disease | Admitting: Cardiovascular Disease

## 2024-05-22 ENCOUNTER — Ambulatory Visit (HOSPITAL_BASED_OUTPATIENT_CLINIC_OR_DEPARTMENT_OTHER): Admitting: Cardiovascular Disease

## 2024-05-22 ENCOUNTER — Encounter (HOSPITAL_BASED_OUTPATIENT_CLINIC_OR_DEPARTMENT_OTHER): Payer: Self-pay | Admitting: Cardiovascular Disease

## 2024-05-22 VITALS — BP 110/70 | HR 63 | Temp 97.7°F | Ht 64.57 in | Wt 170.2 lb

## 2024-05-22 DIAGNOSIS — T451X5A Adverse effect of antineoplastic and immunosuppressive drugs, initial encounter: Secondary | ICD-10-CM | POA: Insufficient documentation

## 2024-05-22 DIAGNOSIS — I493 Ventricular premature depolarization: Secondary | ICD-10-CM | POA: Insufficient documentation

## 2024-05-22 DIAGNOSIS — I427 Cardiomyopathy due to drug and external agent: Secondary | ICD-10-CM | POA: Insufficient documentation

## 2024-05-22 LAB — CBC, DIFF
% Basophils: 1 %
% Eosinophils: 3 %
% Immature Granulocytes: 0 %
% Lymphocytes: 33 %
% Monocytes: 7 %
% Neutrophils: 56 %
% Nucleated RBC: 0 %
Absolute Eosinophil Count: 0.12 10*3/uL (ref 0.00–0.50)
Absolute Lymphocyte Count: 1.37 10*3/uL (ref 1.00–4.80)
Basophils: 0.03 10*3/uL (ref 0.00–0.20)
Hematocrit: 36 % (ref 36.0–45.0)
Hemoglobin: 12.1 g/dL (ref 11.5–15.5)
Immature Granulocytes: 0.01 10*3/uL (ref 0.00–0.05)
MCH: 30.6 pg (ref 27.3–33.6)
MCHC: 34 g/dL (ref 32.2–36.5)
MCV: 90 fL (ref 81–98)
Monocytes: 0.29 10*3/uL (ref 0.00–0.80)
Neutrophils: 2.36 10*3/uL (ref 1.80–7.00)
Nucleated RBC: 0 10*3/uL
Platelet Count: 228 10*3/uL (ref 150–400)
RBC: 3.95 10*6/uL (ref 3.80–5.00)
RDW-CV: 12.4 % (ref 11.0–14.5)
WBC: 4.18 10*3/uL — ABNORMAL LOW (ref 4.3–10.0)

## 2024-05-22 LAB — TRANSTHORACIC ECHO (TTE) COMPLETE
AoV max: 113 cm/s
Ascending aorta: 3.2 cm
Ascending aorta: 3.2 cm
IVSd: 0.7 cm
IVSd: 0.7 cm
LVIDd: 4.9 cm
LVIDd: 4.9 cm
LVIDs: 3.4 cm
LVIDs: 3.4 cm
LVPWd: 0.8 cm
LVPWd: 0.8 cm
RV Free wall pk S': 20.1 mmHg
RVDd: 3.8 cm
RVDd: 3.8 cm
TR Peak Vel: 194 cm/s

## 2024-05-22 LAB — COMPREHENSIVE METABOLIC PANEL
ALT (GPT): 14 U/L (ref 7–33)
AST (GOT): 20 U/L (ref 9–38)
Albumin: 4.5 g/dL (ref 3.5–5.2)
Alkaline Phosphatase (Total): 47 U/L (ref 34–121)
Anion Gap: 6 (ref 4–12)
Bilirubin (Total): 0.4 mg/dL (ref 0.2–1.3)
Calcium: 10.1 mg/dL (ref 8.9–10.2)
Carbon Dioxide, Total: 32 meq/L (ref 22–32)
Chloride: 103 meq/L (ref 98–108)
Creatinine: 0.85 mg/dL (ref 0.38–1.02)
Glucose: 89 mg/dL (ref 62–125)
Potassium: 4.2 meq/L (ref 3.6–5.2)
Protein (Total): 6.8 g/dL (ref 6.0–8.2)
Sodium: 141 meq/L (ref 135–145)
Urea Nitrogen: 11 mg/dL (ref 8–21)
eGFR by CKD-EPI 2021: >60 mL/min/1.73_m2 (ref >59)

## 2024-05-22 LAB — LIPID PANEL
Cholesterol/HDL Ratio: 3.2
HDL Cholesterol: 61 mg/dL (ref >39)
LDL Cholesterol, NIH Equation: 89 mg/dL (ref <130)
Non-HDL Cholesterol: 133 mg/dL (ref 0–159)
Total Cholesterol: 194 mg/dL (ref <200)
Triglyceride: 267 mg/dL — ABNORMAL HIGH (ref <150)

## 2024-05-22 LAB — MAGNESIUM: Magnesium: 2.3 mg/dL (ref 1.8–2.4)

## 2024-05-22 LAB — B_TYPE NATRIURETIC PEPTIDE: B_Type Natriuretic Peptide: 13 pg/mL (ref <101)

## 2024-05-22 NOTE — Progress Notes (Signed)
 ADVANCED HEART FAILURE / HEART TRANSPLANT CLINIC NOTE    Date of Visit: 05/22/2024    ID:    Yolanda Bowers is a 53 year old female with history of high-grade infiltrating ductal carcinoma of the left breast (ER+/PR+, FISH +) with angiolymphatic invasion, history of neoadjuvant chemotherapy with ddAC-THP, left mastectomy with lymph node biopsy, received 4 or 6 cycles of herceptin  and Perjeta  due to drop in EF on letrozole , VT, history of PVC ablation on 03/21/2020, who presents for follow-up.    Interval history:   Last seen 10/20/2023.  We scheduled a cardiopulmonary exercise test during that visit--she completed the study on 11/17/2023 which demonstrated normal functional capacity with improvement from her prior studies.  PFTs were also normal.  She continues to have fatigue but much improved from prior visits.      She has been working out more.  She was able to walk 4 miles in the warm heat this summer.  She has been participating in endurance training classes which has been challenging for her.  She denies any heart failure symptoms.  She had more bloating with evaluation demonstrating increased stool burden.      Past Medical History:   Diagnosis Date    Chemotherapy adverse reaction 06/25/2019    Allergic rhinitis due to allergen     Anemia     Anxiety     Breast cancer (HCC)     Burn     Cancer [facility])     Congestive heart failure (HCC)     Headache     History of radiation therapy        Allergies  Review of patient's allergies indicates:  Allergies   Allergen Reactions    Oxycodone  Skin: Itching    Codeine HP:[id number]/[id number]    Lisinopril  Resp: Cough    Paclitaxel  Skin: Hives    Walnut Skin: Rash       Current Medications:  Current Outpatient Medications   Medication Sig Dispense Refill    acetaminophen  500 MG tablet Take 1-2 tablets (500-1,000 mg) by mouth as needed. Take 1-2 tabs by mouth once daily as needed for pain.      aspirin 81 MG EC tablet Take 1 tablet (81 mg) by mouth.      cetirizine  10 MG  tablet Take by mouth.      cholecalciferol 50 mcg (2,000 unit) capsule Take 5,000 units by mouth daily.      famotidine  20 MG tablet Take 1 tablet (20 mg) by mouth as needed.      letrozole  2.5 MG tablet Take 1 tablet (2.5 mg) by mouth daily 90 tablet 3    methocarbamol  500 MG tablet Take 1 tablet (500 mg) by mouth at bedtime as needed for muscle spasms. 30 tablet 2    metoprolol  succinate ER 25 MG 24 hr tablet Take 1 tablet (25 mg) by mouth 2 times daily 180 tablet 2    valsartan  40 MG tablet Take 1 tablet (40 mg) by mouth 2 times daily 180 tablet 2    venlafaxine  ER 75 MG 24 hr capsule Take 1 capsule (75 mg) by mouth 2 times a day. Per patient.       No current facility-administered medications for this visit.       Family History:  Her family history includes Alcohol/Drug in her maternal grandfather; Diabetes in her mother; Hearing Loss in her father and mother; Obesity in her maternal grandfather and mother; Ovarian Cancer (age of onset: 67) in  her paternal aunt; Skin Cancer in her father and maternal grandfather.    Social History:  She reports that she has never smoked. She has been exposed to tobacco smoke. She has never used smokeless tobacco. She reports that she does not currently use alcohol. She reports that she does not currently use drugs.      Review of Systems:    Otherwise negative except as noted in history of present illness and past medical history.    Physical Exam:  BP 110/70   Pulse 63   Temp 36.5 C (Temporal)   Ht 5' 4.57 (1.64 m) Comment: per chart hx  Wt 77.2 kg (170 lb 3.2 oz)   SpO2 99%   BMI 28.70 kg/m   Wt Readings from Last 3 Encounters:   05/22/24 77.2 kg (170 lb 3.2 oz)   04/26/24 78.4 kg (172 lb 13.5 oz)   11/24/23 78 kg (171 lb 15.3 oz)       GENERAL: Calm adult sitting in exam room in NAD.  NEURO: Alert and oriented x 4, follows commands, MAE, clear and fluent speech  HEENT: Normocephalic, atraumatic, sclera anicteric, facies symmetrical  RESPIRATORY: Respiratory rhythm  and rate regular, oxygen saturations WNL on room air, lungs CTA in all fields  CV: RRR, normal S1, S2 w/out m/r/g, no JVP ~3cm, 2+ bilateral radial pulses, no bilateral lower extremity edema  ABDOMEN: Soft, non-tender, non-distended, BT present  MSK: No gross deformities, ambulates independently  SKIN: Warm and well perfused  PSYCH: Coherent, pleasant      Labs:  Office Visit on 05/22/24   1. B-Type Natriuretic Peptide   Result Value Ref Range    B_Type Natriuretic Peptide 13 <101 pg/mL   2. CBC with Diff   Result Value Ref Range    WBC 4.18 (L) 4.3 - 10.0 10*3/uL    RBC 3.95 3.80 - 5.00 10*6/uL    Hemoglobin 12.1 11.5 - 15.5 g/dL    Hematocrit 36 63.9 - 45.0 %    MCV 90 81 - 98 fL    MCH 30.6 27.3 - 33.6 pg    MCHC 34.0 32.2 - 36.5 g/dL    Platelet Count 771 849 - 400 10*3/uL    RDW-CV 12.4 11.0 - 14.5 %    % Neutrophils 56 %    % Lymphocytes 33 %    % Monocytes 7 %    % Eosinophils 3 %    % Basophils 1 %    % Immature Granulocytes 0 %    Neutrophils 2.36 1.80 - 7.00 10*3/uL    Absolute Lymphocyte Count 1.37 1.00 - 4.80 10*3/uL    Monocytes 0.29 0.00 - 0.80 10*3/uL    Absolute Eosinophil Count 0.12 0.00 - 0.50 10*3/uL    Basophils 0.03 0.00 - 0.20 10*3/uL    Immature Granulocytes 0.01 0.00 - 0.05 10*3/uL    Nucleated RBC 0.00 0.00 10*3/uL    % Nucleated RBC 0 %   3. Comprehensive Metabolic Panel   Result Value Ref Range    Sodium 141 135 - 145 meq/L    Potassium 4.2 3.6 - 5.2 meq/L    Chloride 103 98 - 108 meq/L    Carbon Dioxide, Total 32 22 - 32 meq/L    Anion Gap 6 4 - 12    Glucose 89 62 - 125 mg/dL    Urea Nitrogen 11 8 - 21 mg/dL    Creatinine 9.14 9.61 - 1.02 mg/dL    Protein (Total) 6.8 6.0 -  8.2 g/dL    Albumin 4.5 3.5 - 5.2 g/dL    Bilirubin (Total) 0.4 0.2 - 1.3 mg/dL    Calcium 89.8 8.9 - 89.7 mg/dL    AST (GOT) 20 9 - 38 U/L    Alkaline Phosphatase (Total) 47 34 - 121 U/L    ALT (GPT) 14 7 - 33 U/L    eGFR by CKD-EPI 2021 >60 >59 mL/min/1.73_m2   4. Lipid Panel   Result Value Ref Range    Total  Cholesterol 194 <200 mg/dL    Triglyceride 732 (H) <150 mg/dL    HDL Cholesterol 61 >60 mg/dL    Non-HDL Cholesterol 133 0 - 159 mg/dL    LDL Cholesterol, NIH Equation 89 <130 mg/dL    Cholesterol/HDL Ratio 3.2     Lipid Panel, Additional Info. (NOTE)    5. Magnesium    Result Value Ref Range    Magnesium  2.3 1.8 - 2.4 mg/dL   Hospital Encounter on 05/22/24   6. TransTHORACIC echo (TTE) complete   Result Value Ref Range    Ascending aorta 3.2 cm    IVSd 0.7 cm    LVIDd 4.9 cm    LVIDs 3.4 cm    LVPWd 0.8 cm    RVDd 3.8 cm    AoV max 113 cm/sec    TR Peak Vel 194 cm/sec    Ascending aorta 3.2 cm    IVSd 0.7 cm    LVIDd 4.9 cm    LVIDs 3.4 cm    LVPWd 0.8 cm    RVDd 3.8 cm    RV Free wall pk S' 20.1 mmHg    Narrative    Conclusion  1. Normal left ventricular size, wall thickness and function (EF 59%, GLS  -22%). No regional wall motion abnormalities seen.  2. Normal LV diastolic function.  3. Normal valvular anatomy and function.  4. Normal right ventricular size and function.  5. Normal pulmonary artery (PASP 15-20 mmHg) and central venous (0-5 mmHg)  pressures.  6. Aneurysmal atrial septum. A PFO was suspected on the prior study but is  not well seen today.   7. No pericardial effusion.    Compared to the prior study dated 09/07/2022, no major changes seen.        Results for orders placed or performed in visit on 05/22/24   Lipid Panel    Collection Time: 05/22/24  1:20 PM   Result Value Ref Range    Total Cholesterol 194 <200 mg/dL    Triglyceride 732 (H) <150 mg/dL    HDL Cholesterol 61 >60 mg/dL    Non-HDL Cholesterol 133 0 - 159 mg/dL    LDL Cholesterol, NIH Equation 89 <130 mg/dL    Cholesterol/HDL Ratio 3.2     Lipid Panel, Additional Info. (NOTE)        Imaging/procedures:    Echocardiogram  05/22/2024:  Results for orders placed or performed in visit on 05/22/24   TransTHORACIC echo (TTE) complete   Result Value Ref Range    Ascending aorta 3.2 cm    IVSd 0.7 cm    LVIDd 4.9 cm    LVIDs 3.4 cm    LVPWd 0.8 cm     RVDd 3.8 cm    AoV max 113 cm/sec    TR Peak Vel 194 cm/sec    Ascending aorta 3.2 cm    IVSd 0.7 cm    LVIDd 4.9 cm    LVIDs 3.4 cm    LVPWd 0.8  cm    RVDd 3.8 cm    RV Free wall pk S' 20.1 mmHg    Narrative    Conclusion  1. Normal left ventricular size, wall thickness and function (EF 59%, GLS  -22%). No regional wall motion abnormalities seen.  2. Normal LV diastolic function.  3. Normal valvular anatomy and function.  4. Normal right ventricular size and function.  5. Normal pulmonary artery (PASP 15-20 mmHg) and central venous (0-5 mmHg)  pressures.  6. Aneurysmal atrial septum. A PFO was suspected on the prior study but is  not well seen today.   7. No pericardial effusion.    Compared to the prior study dated 09/07/2022, no major changes seen.         cMRI   03/18/2020:  1. The left ventricle (LV) is mildly dilated (LVEDVi= 94 ml/m2) with low normal systolic function (LVEF= 53%). No regional wall motion abnormalities. There is no LV hypertrophy (LVmassI= 45 gm/m2).     2. The right ventricle (RV) is normal in size (RVEDVi= 83 ml/m2) with normal systolic function (RVEF= 52%).      3. On delayed enhancement imaging there is intramyocardial late gadolinium enhancement at the inferior RV attachment site.     4. On limited T2-weighted imaging there is no evidence of myocardial edema.     5. On T1-mapping, the native T1 value is  (normal).     6. No significant valvular abnormalities.     7. Left atrial area measures 25cm2 and LAVI= 69mL/m2 (dilated) via biplanar method; Right atrial area measures 14cm2 via 4-chamber monoplanar method.    Holter monitor   07/29/2020:  Predominant rhythm: NSR   Atrial Tachycardia (AT) 13 episodes, Longest 5 beats @ Avg  83 bpm up to 103 bpm, Fastest 4 beats @ Avg 143 bpm up to  177 bpm   Accelerated Idioventricular Rhythm (AIVR)   PAC 0.03 %   PVC 0.11 %     06/25/2021:  Predominant rhythm: NSR   Atrial Tachycardia (AT) 9 episodes, Longest 6 beats @ Avg  116 bpm up to 137  bpm, Fastest 4 beats @ Avg 157 bpm up to  209 bpm   Ectopic Atrial Rhythm (EAR)   PAC 0.03 %   PVC 0.04 %     04/08/2022:  Predominant rhythm: NSR  - Atrial Tachycardia (AT) 11 episodes, Longest 20 beats @ Avg 151 bpm up to 174 bpm, Fastest 5 beats @ Avg 177 bpm up to 201  bpm  - Ectopic Atrial Rhythm (EAR)  - PAC <0.1 %  - PVC 0.2 %     Patient activation for SR, EAR, PACs, and PVCs.    12/24/22 CAM    Predominant rhythm: NSR,   - Atrial Tachycardia (AT) 7 episodes, Longest 21 beats @ Avg 137 bpm up to 168 bpm, Fastest 3 beats @ Avg 149 bpm up to 168  bpm,   - Ectopic Atrial Rhythm (EAR),   - PAC <0.1 %,   - PVC 0.2 %    Cardiopulmonary exercise test   05/30/2020:  Max VO2 15 ml/kg/min (66%) , RER 1.21, normal blood pressure response to exercise, normal heart rate response, O2 pulse of 82%, 30% MVV    11/17/2023:  max VO2 19.9 (100%), RER 1.05, VE/VCO2 22 98% max heart rate, O2 pulse 103% blood pressure 104/70 to 120/61, MVV 40%.  Stopped due to chest pain, and dizziness.      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  No genetic cause for cardiomyopathy or arrhythmias  Max VO2 of 19.9 on cardiopulmonary exercise test (10/2023)    Ms. Swarm has progressive improvement in functional capacity  demonstrated by repeat cardiopulmonary exercise test, which has improved considerably from prior study from 2021.  She continues to have muscle fatigue, which I attribute to sarcopenia which she may have developed secondary to chemo and deconditioning.  She was encouraged to continue with isometric exercises to build up muscle strength and work on cardiovascular exercises. Overall, I am encouraged with the improvement.    PLAN:  Continue with GDMT as reported above  Stop aspirin since EF has improved   Work on core and muscle strengthening exercises  Minimize work stressors    Follow-up:  Follow-up in 5-6 months.  Patient will call sooner if symptoms change.     I spent a total of 31 minutes for the patient's care on the date of the service.  This includes the time reviewing the chart, time with the patient in the exam room,  and time completing the note.     Marceil Bring, MD  Division of Cardiology  Advanced Heart Failure/Cardiac Transplantation  05/22/2024

## 2024-05-22 NOTE — Patient Instructions (Signed)
 No change to meds    Continue to work on endurance exercises

## 2024-06-06 ENCOUNTER — Telehealth (HOSPITAL_BASED_OUTPATIENT_CLINIC_OR_DEPARTMENT_OTHER)

## 2024-06-06 DIAGNOSIS — C50412 Malignant neoplasm of upper-outer quadrant of left female breast: Secondary | ICD-10-CM

## 2024-06-06 DIAGNOSIS — Z170 Estrogen receptor positive status [ER+]: Secondary | ICD-10-CM

## 2024-06-06 NOTE — Progress Notes (Signed)
 [facility] SOCIAL WORK CLINICAL NOTE    ID: Yolanda Bowers is a 53 year old female with history of cT3N3 left breast invasive ductal carcinoma, ER/PR/HER2+.    Reason for referral to Social Work: Supportive Counseling   Encounter method: Telehealth: I conducted this encounter via secure, live, face-to-face video conference with the patient. I reviewed the risks and benefits of telemedicine as pertinent to this visit and the patient agreed to proceed. Patient was located at home. Provider was located at off-site location.  Encounter type: Return Social Work Visit  Present at the visit: Patient    ASSESSMENT:    Screening scores    PHQ-9 depression screening history:  PHQ9 Total Scores         10/12/2023 10/31/2023 11/16/2023 12/18/2023 01/06/2024 01/23/2024 04/13/2024    PHQ9 Total Score: 13  12  9  10  13 7  8            Thoughts that you would be better off dead or of hurting yourself in some way: (Patient-Rptd) 0- Not at all    PHQ-9 score indicates: 10 - 14: Moderate depression  PHQ-9 Flowsheet    GAD-7 anxiety screening history:  GAD7 Total Scores         10/12/2023 10/31/2023 11/16/2023 12/18/2023 01/06/2024 01/23/2024 04/13/2024    Total: 7  5  6  7  10 5  9            GAD-7 score indicates: 5 - 9: Mild anxiety  GAD-7 Flowsheet    Suicide assessment   Suicide assessment not indicated today.      Mental status exam  Appearance: Good grooming and hygiene  Behavior: Appropriately interactive w/ others  Speech: Normal rate, Normal rhythm  Affect: Appropriate  Mood: Patient reported overwhelm  Thought Form/Content: Coherent, Concrete  Attention/Concentration: Good  Memory: Normal  Judgement/Insight: Intact    Psychosocial background   Mental health history (diagnoses, in-pt/out-pt treatment, psychotropic medications, SI and/or trauma history, family MH history): Patient reported diagnosis of Major Depressive Disorder. Patient prescribed Venlafaxine  75 mg daily. Patient reported attending counseling at [facility]  every couple weeks. Patient endorsed the use of trauma informed care and cognitive behavioral therapy modalities. Patient reported that she previously saw Dr. Carlie with [facility]. Patient reported experiencing symptoms of anxiety, low energy, and fatigue. Patient shared that she is beginning to track her sleep. Patient shared that some of the anxiety she is experiencing is due to fear of cancer reoccurrence or diagnosis of other health challenges.    Substance use history (alcohol, marijuana, recreational drugs): Per chart review, patient does not endorse use of tobacco or drug use. Patient does not currently endorse alcohol use. Patient endorses occasional wine, about 2 glasses per month.    Previous steroid use and adverse reaction: Not reviewed today    Coping skills and strengths: just keep going, hide and mask, walks, exercise, being creative, travel, paint, print making, [employer] activities    Barriers to activation (sleep, pain, exercise, and fatigue concerns): Anxiety, stress     Brief social history (childhood, family situation, current home/living situation, community supports, education, work, Hotel manager active duty/veteran status, cultural factors):   Patient lives in [place] with her [place], 21 yo son, Yolanda Bowers 47 yo son, and son's girlfriend, Yolanda Bowers. Patient shared that she her partner, Yolanda Bowers, lives in [place]. The couple have been together for a couple years. Patient currently in divorce process with her ex husband. Patient reported that they  have not been living together for over a year and in the mediation stage. Patient shared there are safety precautions in place to protect both herself and kids, including restraining order. Patient has a sister, brother, and sister-in law living in [place]. Patient works full-time as a Runner, broadcasting/film/video in the [employer] 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 Patient reported that she is aware of PFML and inquired  about possibility last week Patient aware of pay cut if she does continue with PFML. 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 Patient finds joy in creativity, travelling, and spending time with her close loved ones.     Religious/spiritual background and affiliation: Not reviewed today    Treatment-related logistics/concerns  Caregiver plan for treatment: Not reviewed today    Lodging/housing plan for treatment: Lives in [place]    Travel/lodging benefit for treatment: Not reviewed today    Practical/logistical/financial concerns: To discuss further at next visit    Potential barriers to accessing care: Not reviewed today    Treatment preferences (communication, goals, care preferences): Communication, goals    Advance care planning: Not reviewed today    Release of Information (ROI): Not reviewed today  --  ENCOUNTER SUMMARY:  Yolanda Bowers is a 53 year old female with history of cT3N3 left breast invasive ductal carcinoma, ER/PR/HER2+.  Patient's psychometric responses indicate potential 10 - 14: Moderate depression and 5 - 9: Mild anxiety. Patient denies SI and HI, and reports feeling safe.    10/15:  Reports on experiencing severe headaches, follow up CT scan in November, lower back, fatigue. Reports on about to engage in PT. Shares on finding it helpful to wear compression socks throughout work day.  Shares on stretching more often, taking walks, eating intentionally (increased chicken/fish for protein). Shares on focusing on nighttime routine -- sleeping 8-10 hours per night.     Shares on engaging with a breast cancer support group ([facility]) -- to look into additional groups that are similar age/stage. Shares on finding activities that fuel her, including new pottery class gig, social support, [employer] class. Shares on divorce finalization, renewed protection order. Expresses exhaustion during the entire process. Pt brainstorms image of what is fueling v  pouring out during this season. MSW validated patient feelings and strength of advocating for her needs.     8/22: Patient reported on recent trip with [place] with Mal and Onnie this summer. 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 Patient expressed ways to to prioritize her needs, including joining [employer] during the week for body movement, breast friends support group, and finding creative spaces/folks. Patient expressed hopes of finding glimmers of joy -- embracing the joy found at the waterfall on her trip. Patient reported ongoing plans for the divorce proceedings, which is causing additional stress. Patient connected with [facility].    6/2: Patient reported on Psychiatry consultation with Dr. Carlie. Patient shared on shifting medication, as she is adding an extra dose at night. Patient reported since making the shift, she has noticed changes in productivity, motivation, and not waking up in the middle of the night drenched in sweat. Patient reported on experiencing severe hot flashes, estimating about 30-40 per day. Patient expressed ways in which she is setting boundaries and making time for herself. Patient shared on 3--45 min walks in the garden, organizing  her household, and intentional time for creativity. Patient shared on, saving seeds for her future self. Patient and MSW explored themes of blooming and setting herself up for the future. Patient to visit [place] from July to August.    MSW sent patient email re: [facility] Support [facility] - 6/11.     4/28: Patient reported on recent trip to [place] with [name]. Patient expressed gratitude for time to renew and away from responsibilities. Patient shared that they walked on the beach, ate with intention, and wandered.     Patient shared goals of walking 30 minutes per day for body movement. Discussed setting timers for activity scheduling and daily goals. Discussed Behavioral  Activation strategies.Patient expressed feelings of isolation and disconnection. Patient shared that she is yearning for connection. MSW and patient brainstormed possibilities - support groups, creative spaces.     Patient engaged with Neuro Psych. Patient reported hopes of discussion of Effexor  side effects and depression symptoms.    Resources to Send:  - Support Groups - cancer lifeline, cancer pathways, parent group  - Art Therapy  - BA ideas - set timers    3/27: MSW conducted telehealth visit. Patient no showed. MSW to reach out to patient and reschedule for a later time.    3/11: Patient shared on feelings of exhaustion, as she is continuing to cope with mental and physical stressors. Patient reported on PFMLA and potential options for work schedule. Patient shared that she is focusing on taking care of herself, in ways like I am going to reclaim my life and regain my time/interests. Patient shared that she is taking a few classes a week through [employer] and a swim classes for physical activity.     2/20: MSW conducted initial assessment with patient. MSW shared that she has experienced fatigue, hot flashes, and nausea since chemo treatment. Patient currently on Letrozole  since 2021. Patient presented to [facility] with new breast lump in December of 2024. Patient working with med team to assess. Patient shared that there are external factors that have been causing increased anxiety and depression symptoms, as she is going through divorce proceedings. Patient expressed that she is focused on what is best for her and her sons. MSW provided space for patient to name and externally process emotions. Patient sees counselor in the community every other week. Patient expressed how she is engaging in activities that bring joy and ease during this time, including creative projects and quality time with her sons. Patient to meet with [facility] in a week.    Referrals:  [facility]  --  INTERVENTION:  MSW  introduced self and role as resource-linker, advocate, and supportive counselor, Provided contact information, and Provided counseling support    Themes explored:  Adjustment to diagnosis/treatment, Logistical/practical stressors, Mood concerns/symptoms, and Relationships    Therapeutic interventions/approaches used:  Relaxation techniques, Self-compassion, and Supportive counseling    --  PLAN:   MSW appreciated the opportunity to connect with this patient. Patient is aware of Social Work role and services. Return visit planned and order placed for: 12/3    Time Spent - Direct Care: 60 minutes  Time Spent - Indirect Care: 20 minutes    Adolph Guillaume  [phone]

## 2024-07-18 ENCOUNTER — Encounter (HOSPITAL_BASED_OUTPATIENT_CLINIC_OR_DEPARTMENT_OTHER): Payer: Self-pay

## 2024-07-25 ENCOUNTER — Telehealth (HOSPITAL_BASED_OUTPATIENT_CLINIC_OR_DEPARTMENT_OTHER)

## 2024-07-25 DIAGNOSIS — Z170 Estrogen receptor positive status [ER+]: Secondary | ICD-10-CM

## 2024-07-25 NOTE — Progress Notes (Signed)
 Clinical Return [facility] CLINICAL FOLLOW-UP NOTE    Encounter method: Telehealth: I conducted this encounter via secure, live, face-to-face video conference with the patient. I reviewed the risks and benefits of telemedicine as pertinent to this visit and the patient agreed to proceed. Patient was located at home. Provider was located at on-site location.  Present at the visit: Patient  Please reference Social Work note dated 10/13/23 for initial assessment.  --  Confidentiality & medical record disclosure:  At the start of the session, the patient was informed that this visit and any relevant information will be documented in their medical record. The patient was also reminded that confidentiality is maintained except in cases where disclosure is legally required, such as suspected abuse of a vulnerable individual, risk of harm to self or others, or court-ordered requests. The patient acknowledged understanding of this information.    Encounter summary:  Yolanda Bowers is a 53 year old female with metastatic breast cancer.  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 Reports on about to engage in PT. Shares on finding it helpful to wear compression socks throughout work day.  Shares on stretching more often, taking walks, eating intentionally (increased chicken/fish for protein). Shares on focusing on nighttime routine -- sleeping 8-10 hours per night.       Shares on engaging with a breast cancer support group ([facility]) -- to look into additional groups that are similar age/stage. Shares on finding activities that fuel her, including new pottery class gig, social support, [employer] class. Shares on divorce finalization, renewed protection order. Expresses exhaustion during the entire process. Pt brainstorms image of what is fueling v pouring out during this season. MSW validated patient feelings and strength of advocating for her needs.     Psychotropic medications/updates: Venlafaxine  75 mg - two doses per day    Current substance use: Does not endorse substance use    Screening scores  GAD-7 anxiety screening history:  GAD7 Total Scores         10/12/2023 10/31/2023 11/16/2023 12/18/2023 01/06/2024 01/23/2024 04/13/2024    Total: 7  5  6  7  10 5  9                     07/24/2024                      Total: 10                 GAD-7 score indicates: 10 - 14: Moderate anxiety  GAD-7 Flowsheet    PHQ-9 depression screening history:  PHQ9 Total Scores         10/12/2023 10/31/2023 11/16/2023 12/18/2023 01/06/2024 01/23/2024 04/13/2024    PHQ9 Total Score: 13  12  9  10  13 7   8  07/24/2024                      PHQ9 Total Score: 11                 Thoughts that you would be better off dead or of hurting yourself in some way: (Patient-Rptd) 0- Not at all    PHQ-9 score indicates: 10 - 14: Moderate depression  PHQ-9 Flowsheet    Suicide assessment {Tip  If patient scores 1+ on the 9th question of PHQ-9, please proceed to complete the C-SSRS Flowsheet and Safety Assessment. If patient scores moderate+, then complete Safety Plan as well :999}  Suicide assessment not indicated today.  {Tip  Suicide Assessment can be entered via Smart[employer] .[facility]SWSUICIDE:999}    Mental status exam  Appearance: Good grooming and hygiene  Behavior: Appropriately interactive w/ others  Speech: Normal rate, Normal rhythm  Affect: Appropriate, Bright  Mood: Patient reported  ***  Thought Form/Content: Coherent, Concrete  Attention/Concentration: Good  Memory: Normal  Judgement/Insight: Intact  --  Themes and Therapeutic Approaches:    Social Work action items:  MSW introduced self and role as engineer, structural, advocate, and supportive counselor, Provided contact information, Provided counseling support, Provided navigational support and resources, and Discussed coping and supports for patient and caregivers    Themes explored:  Adjustment to diagnosis/treatment    Therapeutic approaches used:  Supportive counseling    --  Plan: {Tip  For F/U SW visits please schedule this with patient when possible, and place order for time discussed, indicating to [facility] no confirmation necessary. :999}  MSW appreciated the opportunity to connect with this patient. Patient is aware of Social Work role and services. Return visit planned and order placed for: ***    Time Spent - Direct Care: {Direct Rjmz:882456}  Time Spent - Indirect Care: {Indirect Rjmz:882455}    Adolph Guillaume  [phone]

## 2024-07-27 ENCOUNTER — Telehealth (HOSPITAL_BASED_OUTPATIENT_CLINIC_OR_DEPARTMENT_OTHER): Payer: Self-pay | Admitting: Cardiovascular Disease

## 2024-07-27 NOTE — Telephone Encounter (Signed)
 Received dental clearance for fillings and crown with local anesthetics. Pending provider review.

## 2024-07-30 ENCOUNTER — Telehealth (HOSPITAL_BASED_OUTPATIENT_CLINIC_OR_DEPARTMENT_OTHER): Payer: Self-pay

## 2024-07-30 NOTE — Telephone Encounter (Addendum)
 Completed and faxed back with most recent office note.

## 2024-07-30 NOTE — Telephone Encounter (Signed)
 Opened in error

## 2024-09-01 ENCOUNTER — Encounter (HOSPITAL_BASED_OUTPATIENT_CLINIC_OR_DEPARTMENT_OTHER): Payer: Self-pay

## 2024-09-05 ENCOUNTER — Telehealth (HOSPITAL_BASED_OUTPATIENT_CLINIC_OR_DEPARTMENT_OTHER)

## 2024-09-07 ENCOUNTER — Encounter (HOSPITAL_BASED_OUTPATIENT_CLINIC_OR_DEPARTMENT_OTHER): Payer: Self-pay | Admitting: Medical Oncology

## 2024-09-11 ENCOUNTER — Telehealth (HOSPITAL_BASED_OUTPATIENT_CLINIC_OR_DEPARTMENT_OTHER)

## 2024-09-11 DIAGNOSIS — Z170 Estrogen receptor positive status [ER+]: Secondary | ICD-10-CM

## 2024-09-11 NOTE — Progress Notes (Signed)
 Clinical Return [facility] CLINICAL FOLLOW-UP NOTE    Encounter method: Telehealth: I conducted this encounter via secure, live, face-to-face video conference with the patient. I reviewed the risks and benefits of telemedicine as pertinent to this visit and the patient agreed to proceed. Patient was located at home. Provider was located at on-site location.  Present at the visit: Patient  Please reference Social Work note dated 10/13/23 for initial assessment.  --  Confidentiality & medical record disclosure:  At the start of the session, the patient was informed that this visit and any relevant information will be documented in their medical record. The patient was also reminded that confidentiality is maintained except in cases where disclosure is legally required, such as suspected abuse of a vulnerable individual, risk of harm to self or others, or court-ordered requests. The patient acknowledged understanding of this information.    Encounter summary:  Kuuipo Lerline Valdivia is a 54 year old female with metastatic breast cancer.  Since last contact, patient reports the following:     Patient shares about spending meaningful time with family over the holidays.     Patient describes significant fatigue, expressing that even 12 hours of sleep does not feel restorative and that she is craving deeper rest. 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 She wonders what reducing work might look like moving forward. Patient reflects on taking things one day at a time while finding ways to intentionally rest. Patient also discusses the emotional labor of holding space for others while still tending to her own needs. Discusses strategies or rituals to add into her routine.    Answers submitted by the patient for this visit:  Telemedicine Remote Visit on 09/11/2024  1:00 PM with [facility]  Health Screening: Depression (Submitted  on 09/11/2024)  PHQ9 SCORE: 11  Health Screening: Anxiety (Submitted on 09/11/2024)  GAD7 SCORE: 4    Psychotropic medications/updates: Venlafaxine 75 mg - two doses per day    Current substance use: Does not endorse substance use    Screening scores  GAD-7 anxiety screening history:  GAD7 Total Scores         10/12/2023 10/31/2023 11/16/2023 12/18/2023 01/06/2024 01/23/2024 04/13/2024    Total: 7  5  6  7  10 5  9                    07/24/2024 09/11/2024                   Total: 10  4                GAD-7 score indicates: 10 - 14: Moderate anxiety  GAD-7 Flowsheet    PHQ-9 depression screening history:  PHQ9 Total Scores         10/12/2023 10/31/2023 11/16/2023 12/18/2023 01/06/2024 01/23/2024 04/13/2024    PHQ9 Total Score: 13  12  9  10  13 7  8                    12 /09/2023 09/11/2024                   PHQ9 Total Score: 11  11                Thoughts that you would be better off dead or of hurting yourself in some way: (Patient-Rptd) 0-  Not at all    PHQ-9 score indicates: 10 - 14: Moderate depression  PHQ-9 Flowsheet    Suicide assessment   Suicide assessment not indicated today.      Mental status exam  Appearance: Good grooming and hygiene  Behavior: Appropriately interactive w/ others  Speech: Normal rate, Normal rhythm  Affect: Appropriate, Bright  Mood: Patient reported feeling low  Thought Form/Content: Coherent, Concrete  Attention/Concentration: Good  Memory: Normal  Judgement/Insight: Intact  --  Themes and Therapeutic Approaches:    Social Work action items:  MSW introduced self and role as engineer, structural, advocate, and supportive counselor, Provided contact information, Provided counseling support, Provided navigational support and resources, and Discussed coping and supports for patient and caregivers    Themes explored:  Adjustment to diagnosis/treatment, Hopes, values, goals, Logistical/practical stressors, Mood concerns/symptoms, and Relationships    Therapeutic approaches used:  Behavioral activation, CBTi and/or  sleep strategies, Self-compassion, and Supportive counseling    --  Plan:   MSW appreciated the opportunity to connect with this patient. Patient is aware of Social Work role and services. Return visit planned and order placed for: 3/4    Time Spent - Direct Care: 60 minutes  Time Spent - Indirect Care: 20 minutes    Adolph Guillaume  [phone]

## 2024-09-19 ENCOUNTER — Other Ambulatory Visit (HOSPITAL_BASED_OUTPATIENT_CLINIC_OR_DEPARTMENT_OTHER): Payer: Self-pay | Admitting: Physician Assistant

## 2024-09-19 DIAGNOSIS — C50412 Malignant neoplasm of upper-outer quadrant of left female breast: Secondary | ICD-10-CM

## 2024-09-19 MED ORDER — LETROZOLE 2.5 MG OR TABS: 2.5000 mg | ORAL_TABLET | Freq: Every day | ORAL | 0 refills | Status: AC

## 2024-09-27 ENCOUNTER — Ambulatory Visit

## 2024-09-27 DIAGNOSIS — C50412 Malignant neoplasm of upper-outer quadrant of left female breast: Secondary | ICD-10-CM

## 2024-09-27 DIAGNOSIS — Z170 Estrogen receptor positive status [ER+]: Secondary | ICD-10-CM

## 2024-10-24 ENCOUNTER — Telehealth (HOSPITAL_BASED_OUTPATIENT_CLINIC_OR_DEPARTMENT_OTHER)

## 2024-11-01 ENCOUNTER — Ambulatory Visit: Admitting: Cardiovascular Disease
